# Patient Record
Sex: Male | Born: 1945 | Race: White | Hispanic: No | Marital: Single
Health system: Midwestern US, Community
[De-identification: ages and names within clinical notes are randomized; demographics above are authoritative.]

## PROBLEM LIST (undated history)

## (undated) DIAGNOSIS — K219 Gastro-esophageal reflux disease without esophagitis: Secondary | ICD-10-CM

## (undated) DIAGNOSIS — I10 Essential (primary) hypertension: Secondary | ICD-10-CM

## (undated) DIAGNOSIS — I6529 Occlusion and stenosis of unspecified carotid artery: Secondary | ICD-10-CM

## (undated) DIAGNOSIS — E785 Hyperlipidemia, unspecified: Secondary | ICD-10-CM

## (undated) DIAGNOSIS — G44229 Chronic tension-type headache, not intractable: Secondary | ICD-10-CM

## (undated) DIAGNOSIS — G20 Parkinson's disease: Secondary | ICD-10-CM

## (undated) HISTORY — DX: Occlusion and stenosis of unspecified carotid artery: I65.29

## (undated) HISTORY — DX: Gastro-esophageal reflux disease without esophagitis: K21.9

## (undated) HISTORY — DX: Hyperlipidemia, unspecified: E78.5

## (undated) HISTORY — PX: TONSILLECTOMY: SUR1361

## (undated) HISTORY — DX: Essential (primary) hypertension: I10

## (undated) HISTORY — DX: Parkinson's disease: G20

## (undated) HISTORY — DX: Chronic tension-type headache, not intractable: G44.229

---

## 2015-09-28 ENCOUNTER — Ambulatory Visit
Admit: 2015-09-28 | Discharge: 2015-09-28 | Payer: MEDICARE | Attending: Otolaryngology/Facial Plastic Surgery | Primary: Internal Medicine

## 2015-09-28 DIAGNOSIS — H6123 Impacted cerumen, bilateral: Secondary | ICD-10-CM

## 2015-09-28 NOTE — Progress Notes (Signed)
HPI:  Ricardo Campbell is a [age] y.o. male seen in follow-up for Follow-up (cerumen , last seen 03/30/15). Comes in today for ear cleaning- last seen 6 mo ago. Has noted some fullness in both ears (L > R) and also has had some increased allergy sxs. Stable from Parkinson's standpoint. No other new health issues. 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    Past Medical History, Past Surgical History, Family history, Social History, and Medications were all reviewed with the patient today and updated as necessary.     Allergies   Allergen Reactions   ??? Erythromycin Other (comments)     Stomach Cramps   ??? Pcn [Penicillins] Rash     Patient Active Problem List   Diagnosis Code   ??? Parkinson's disease (HCC) G20   ??? Arthritis M19.90   ??? GERD (gastroesophageal reflux disease) K21.9     Current Outpatient Prescriptions   Medication Sig   ??? carbidopa-levodopa (SINEMET) 25-100 mg per tablet Take 2 Tabs by mouth.   ??? mirtazapine (REMERON) 15 mg tablet Take 15 mg by mouth.   ??? rotigotine (NEUPRO) 6 mg/24 hour patch 1 Patch by TransDERmal route.   ??? pantoprazole (PROTONIX) 40 mg granules for oral suspension Take 1 Tab by mouth.   ??? losartan (COZAAR) 50 mg tablet Take 50 mg by mouth.   ??? Gluc-Chon-MSM#1-C-Mang-Bos-Bor 750-625-30 mg tab Take  by mouth.   ??? fluticasone (FLONASE) 50 mcg/actuation nasal spray 1 Spray by Nasal route.   ??? Dexlansoprazole 60 mg CpDB Take 60 mg by mouth.   ??? cetirizine (ZYRTEC) 10 mg tablet Take 10 mg by mouth.   ??? atorvastatin (LIPITOR) 20 mg tablet Take 20 mg by mouth.   ??? carbidopa-levodopa CR (SINEMET CR) 25-100 mg per tablet Take 1 Tab by mouth.   ??? acetaminophen (TYLENOL) 325 mg tablet Take 500 mg by mouth.   ??? omeprazole (PRILOSEC) 40 mg capsule Take 40 mg by mouth.   ??? aspirin delayed-release 81 mg tablet Take 81 mg by mouth.     No current facility-administered medications for this visit.      Past Medical History:    Diagnosis Date   ??? Arthritis 02/22/2015   ??? Cerumen impaction    ??? GERD (gastroesophageal reflux disease) 02/22/2015   ??? Parkinson's disease (HCC) 02/22/2015   ??? Rheumatic fever in pediatric patient     Age 70     Social History   Substance Use Topics   ??? Smoking status: Never Smoker   ??? Smokeless tobacco: Not on file   ??? Alcohol use Yes      Comment: Occasional     Past Surgical History:   Procedure Laterality Date   ??? HX CATARACT REMOVAL     ??? HX CERVICAL POLYPECTOMY     ??? HX FRACTURE TX Left     foot   ??? HX HEENT      Stapedectomy   ??? HX HEENT      Sinus surgery   ??? HX HEMORRHOIDECTOMY     ??? HX TONSILLECTOMY       Family History   Problem Relation Age of Onset   ??? Cancer Mother      Lymphoma   ??? Cancer Father      lung        ROS:    Review of Systems   Constitutional: Negative for fever.  Eyes: Negative for visual disturbance.   Respiratory: Negative for shortness of breath.    Cardiovascular: Negative for chest pain.   Gastrointestinal: Negative for abdominal pain.   Musculoskeletal: Negative for back pain.   Allergic/Immunologic: Negative for environmental allergies.   Neurological: Negative for dizziness.   Hematological: Negative for adenopathy.        PHYSICAL EXAM:    Visit Vitals   ??? Ht  (1.854 m)   ??? Wt 205 lb (93 kg)   ??? BMI 27.05 kg/m2     General: NAD, well-appearing  Neuro: No gross neuro deficits. No facial weakness.  Eyes: No periorbital edema/ecchymosis. No nystagmus.  Skin: No facial erythema, rashes or concerning lesions.  Nose: No external deviations or saddling. Intranasally, septum is midline without perforations, nasal mucosa appears healthy with no erythema, mucopurulence, or polyps.  Mouth: Moist mucus membranes, normal tongue/palate mobility, no concerning mucosal lesions. Oropharynx clear with no erythema/exudate, no tonsillar hypertrophy.  Ears: Normal appearing auricles, no hematomas. L EAC is almost completely occluded w/ cerumen, R side has mild cerumen laterally- both cleaned out  under the scope. After cleaning, there were intact TMs, no perfs, no MEEs  Neck: Soft, supple, no palpable neck masses. No thyromegaly or palpable thyroid nodules.   Lymphatics: No palpable cervical LAD.  Resp: No audible stridor or wheezing.  Extremities: No clubbing or cyanosis.    PROCEDURE: Cerumen removal under binocular microscopy  INDICATIONS: Cerumen impaction  DESCRIPTION: After verbal consent was obtained and a timeout was performed, the otologic microscope was used to visualize both ears. There were normal appearing auricles bilaterally. There was cerumen impaction on L side only, very mild wax on R side. I cleaned out both ears under the scope w/ a right angle hook and otologic suctions. After cleaning, the ear canal skin was healthy and both TMs were intact w/ no perforations. Both middle ear spaces were clear w/ no effusions. The patient tolerated the procedure well and there were no complications.    ASSESSMENT and PLAN      ICD-10-CM ICD-9-CM    1. Bilateral impacted cerumen H61.23 380.4 REMOVE IMPACTED EAR WAX     He had L > R cerumen impaction and I cleaned out his ears today under the scope. I recommend continued use of mineral oil to help w/ the wax. RTC in 6 mo for another ear cleaning.    Serita Butcher, MD  09/28/2015

## 2016-04-03 ENCOUNTER — Encounter: Attending: Otolaryngology/Facial Plastic Surgery | Primary: Internal Medicine

## 2016-04-10 ENCOUNTER — Encounter: Attending: Otolaryngology/Facial Plastic Surgery | Primary: Internal Medicine

## 2016-04-17 ENCOUNTER — Ambulatory Visit
Admit: 2016-04-17 | Discharge: 2016-04-17 | Payer: MEDICARE | Attending: Otolaryngology/Facial Plastic Surgery | Primary: Internal Medicine

## 2016-04-17 DIAGNOSIS — H6122 Impacted cerumen, left ear: Secondary | ICD-10-CM

## 2016-04-17 NOTE — Progress Notes (Signed)
HPI:  Ricardo Campbell is a 70 y.o. male seen in follow-up for Wax in Ear (last seen 09/28/15 for cerumen ). He has long h/o cerumen impaction and comes in for 6 mo ear cleaning. There has not been any otorrhea, otalgia, or ear fullness but he has noted some muffled hearing, esp on L side. [place] this [place].     Past Medical History, Past Surgical History, Family history, Social History, and Medications were all reviewed with the patient today and updated as necessary.     Allergies   Allergen Reactions   ??? Erythromycin Other (comments)     Stomach Cramps   ??? Pcn [Penicillins] Rash     Patient Active Problem List   Diagnosis Code   ??? Parkinson's disease (HCC) G20   ??? Arthritis M19.90   ??? GERD (gastroesophageal reflux disease) K21.9     Current Outpatient Prescriptions   Medication Sig   ??? pantoprazole (PROTONIX) 40 mg granules for oral suspension Take 1 Tab by mouth.   ??? omeprazole (PRILOSEC) 40 mg capsule Take 40 mg by mouth.   ??? losartan (COZAAR) 50 mg tablet Take 50 mg by mouth.   ??? Gluc-Chon-MSM#1-C-Mang-Bos-Bor 750-625-30 mg tab Take  by mouth.   ??? fluticasone (FLONASE) 50 mcg/actuation nasal spray 1 Spray by Nasal route.   ??? Dexlansoprazole 60 mg CpDB Take 60 mg by mouth.   ??? cetirizine (ZYRTEC) 10 mg tablet Take 10 mg by mouth.   ??? atorvastatin (LIPITOR) 20 mg tablet Take 20 mg by mouth.   ??? carbidopa-levodopa CR (SINEMET CR) 25-100 mg per tablet Take 1 Tab by mouth.   ??? aspirin delayed-release 81 mg tablet Take 81 mg by mouth.   ??? acetaminophen (TYLENOL) 325 mg tablet Take 500 mg by mouth.     No current facility-administered medications for this visit.      Past Medical History:   Diagnosis Date   ??? Arthritis 02/22/2015   ??? Cerumen impaction    ??? GERD (gastroesophageal reflux disease) 02/22/2015   ??? Parkinson's disease (HCC) 02/22/2015   ??? Rheumatic fever in pediatric patient     Age 29     Social History   Substance Use Topics   ??? Smoking status: Never Smoker    ??? Smokeless tobacco: Not on file   ??? Alcohol use Yes      Comment: Occasional     Past Surgical History:   Procedure Laterality Date   ??? HX CATARACT REMOVAL     ??? HX CERVICAL POLYPECTOMY     ??? HX FRACTURE TX Left     foot   ??? HX HEENT      Stapedectomy   ??? HX HEENT      Sinus surgery   ??? HX HEMORRHOIDECTOMY     ??? HX TONSILLECTOMY       Family History   Problem Relation Age of Onset   ??? Cancer Mother      Lymphoma   ??? Cancer Father      lung        ROS:    Review of Systems   Constitutional: Negative for fever.   Eyes: Negative for visual disturbance.   Respiratory: Negative for shortness of breath.    Cardiovascular: Negative for chest pain.   Gastrointestinal: Negative for abdominal pain.   Musculoskeletal: Negative for back pain.   Allergic/Immunologic: Negative for environmental allergies.   Neurological: Negative for dizziness.   Hematological:  Negative for adenopathy.        PHYSICAL EXAM:    Visit Vitals   ??? Ht 6\' 1"  (1.854 m)   ??? Wt 205 lb (93 kg)   ??? BMI 27.05 kg/m2       General: NAD, well-appearing  Neuro: No gross neuro deficits. No facial weakness.  Eyes: No periorbital edema/ecchymosis. No nystagmus.  Skin: No facial erythema, rashes or concerning lesions.  Nose: No external deviations or saddling. Intranasally, septum is midline without perforations, nasal mucosa appears healthy with no erythema, mucopurulence, or polyps.  Mouth: Moist mucus membranes, normal tongue/palate mobility, no concerning mucosal lesions. Oropharynx clear with no erythema/exudate, no tonsillar hypertrophy.  Ears: Normal appearing auricles, no hematomas. Mild cerumen on R side- L side completely impacted. Neck: Soft, supple, no palpable neck masses. No thyromegaly or palpable thyroid nodules.   Lymphatics: No palpable cervical LAD.  Resp: No audible stridor or wheezing.  Extremities: No clubbing or cyanosis.    PROCEDURE: Cerumen removal under binocular microscopy  INDICATIONS: Cerumen impaction   DESCRIPTION: After verbal consent was obtained and a timeout was performed, the otologic microscope was used to visualize both ears. There were normal appearing auricles bilaterally. There was cerumen impaction on L side only. I cleaned out both ears under the scope w/ a right angle hook and otologic suctions. After cleaning, the ear canal skin was healthy and both TMs were intact w/ no perforations. Both middle ear spaces were clear w/ no effusions. The patient tolerated the procedure well and there were no complications.    ASSESSMENT and PLAN      ICD-10-CM ICD-9-CM    1. Impacted cerumen of left ear H61.22 380.4 REMOVE IMPACTED EAR WAX     He has L > R cerumen impaction and I cleaned out his ears under the microscope. 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    Serita ButcherHans T Garland Smouse, MD  04/17/2016

## 2016-04-28 DIAGNOSIS — E7800 Pure hypercholesterolemia, unspecified: Secondary | ICD-10-CM | POA: Insufficient documentation

## 2016-10-16 ENCOUNTER — Ambulatory Visit
Admit: 2016-10-16 | Discharge: 2016-10-16 | Payer: MEDICARE | Attending: Otolaryngology/Facial Plastic Surgery | Primary: Internal Medicine

## 2016-10-16 DIAGNOSIS — H6123 Impacted cerumen, bilateral: Secondary | ICD-10-CM

## 2016-10-16 NOTE — Progress Notes (Signed)
HPI:  Ricardo Campbell. Fetterolf is a 71 y.o. male seen in follow-up for Wax in Ear (6 mth ear cleaning ,last seen 04/17/16 for cerumen ). He has long h/o cerumen impaction and comes in for routine 6 mo ear cleaning. Doing well since last appt in Sept and denies any otalgia or otorrhea. No major change in his hearing.     Past Medical History, Past Surgical History, Family history, Social History, and Medications were all reviewed with the patient today and updated as necessary.     Allergies   Allergen Reactions   ??? Erythromycin Other (comments)     Stomach Cramps   ??? Pcn [Penicillins] Rash     Patient Active Problem List   Diagnosis Code   ??? Parkinson's disease (HCC) G20   ??? Arthritis M19.90   ??? GERD (gastroesophageal reflux disease) K21.9     Current Outpatient Prescriptions   Medication Sig   ??? pantoprazole (PROTONIX) 40 mg granules for oral suspension Take 1 Tab by mouth.   ??? omeprazole (PRILOSEC) 40 mg capsule Take 40 mg by mouth.   ??? losartan (COZAAR) 50 mg tablet Take 50 mg by mouth.   ??? Gluc-Chon-MSM#1-C-Mang-Bos-Bor 750-625-30 mg tab Take  by mouth.   ??? fluticasone (FLONASE) 50 mcg/actuation nasal spray 1 Spray by Nasal route.   ??? Dexlansoprazole 60 mg CpDB Take 60 mg by mouth.   ??? cetirizine (ZYRTEC) 10 mg tablet Take 10 mg by mouth.   ??? atorvastatin (LIPITOR) 20 mg tablet Take 20 mg by mouth.   ??? aspirin delayed-release 81 mg tablet Take 81 mg by mouth.   ??? acetaminophen (TYLENOL) 325 mg tablet Take 500 mg by mouth.     No current facility-administered medications for this visit.      Past Medical History:   Diagnosis Date   ??? Arthritis 02/22/2015   ??? Cerumen impaction    ??? GERD (gastroesophageal reflux disease) 02/22/2015   ??? Parkinson's disease (HCC) 02/22/2015   ??? Rheumatic fever in pediatric patient     Age 15     Social History   Substance Use Topics   ??? Smoking status: Never Smoker   ??? Smokeless tobacco: Never Used   ??? Alcohol use Yes      Comment: Occasional     Past Surgical History:   Procedure Laterality Date    ??? HX CATARACT REMOVAL     ??? HX CERVICAL POLYPECTOMY     ??? HX FRACTURE TX Left     foot   ??? HX HEENT      Stapedectomy   ??? HX HEENT      Sinus surgery   ??? HX HEMORRHOIDECTOMY     ??? HX TONSILLECTOMY       Family History   Problem Relation Age of Onset   ??? Cancer Mother      Lymphoma   ??? Cancer Father      lung        ROS:    Review of Systems   Constitutional: Negative for fever.   Eyes: Negative for visual disturbance.   Respiratory: Negative for shortness of breath.    Cardiovascular: Negative for chest pain.   Gastrointestinal: Negative for abdominal pain.   Musculoskeletal: Negative for back pain.   Allergic/Immunologic: Negative for environmental allergies.   Neurological: Negative for dizziness.   Hematological: Negative for adenopathy.        PHYSICAL EXAM:    Visit Vitals   ??? Ht 6\' 1"  (1.854  m)   ??? Wt 205 lb (93 kg)   ??? BMI 27.05 kg/m2       General: NAD, well-appearing  Neuro: No gross neuro deficits. No facial weakness.  Eyes: No periorbital edema/ecchymosis. No nystagmus.  Skin: No facial erythema, rashes or concerning lesions.  Nose: No external deviations or saddling. Intranasally, septum is midline without perforations, nasal mucosa appears healthy with no erythema, mucopurulence, or polyps.  Mouth: Moist mucus membranes, normal tongue/palate mobility, no concerning mucosal lesions. Oropharynx clear with no erythema/exudate, no tonsillar hypertrophy.  Ears: Normal appearing auricles, no hematomas. EACs have bilateral cerumen impaction, healthy canal skin, TM's intact with no perforations or retraction pockets. No middle ear effusions.  Neck: Soft, supple, no palpable neck masses. No thyromegaly or palpable thyroid nodules.   Lymphatics: No palpable cervical LAD.  Resp: No audible stridor or wheezing.  Extremities: No clubbing or cyanosis.      PROCEDURE: Cerumen removal under binocular microscopy  INDICATIONS: Cerumen impaction  DESCRIPTION: After verbal consent was obtained and a timeout was  performed, the otologic microscope was used to visualize both ears. There were normal appearing auricles bilaterally. There was cerumen impaction bilaterally. I cleaned out both ears under the scope w/ a right angle hook and otologic suctions. After cleaning, the ear canal skin was healthy and both TMs were intact w/ no perforations. Both middle ear spaces were clear w/ no effusions. The patient tolerated the procedure well and there were no complications.        ASSESSMENT and PLAN      ICD-10-CM ICD-9-CM    1. Bilateral impacted cerumen H61.23 380.4 REMOVE IMPACTED EAR WAX     He had bilateral cerumen impaction and I cleaned out his ears today again under the scope- RTC in 6 mo for another ear cleaning.    Serita ButcherHans T Burnetta Kohls, MD  10/16/2016

## 2017-04-18 ENCOUNTER — Ambulatory Visit
Admit: 2017-04-18 | Discharge: 2017-04-18 | Payer: MEDICARE | Attending: Otolaryngology/Facial Plastic Surgery | Primary: Internal Medicine

## 2017-04-18 DIAGNOSIS — H6123 Impacted cerumen, bilateral: Secondary | ICD-10-CM

## 2017-04-18 NOTE — Progress Notes (Signed)
HPI:  Ricardo Campbell. Kreiter is a 71 y.o. male seen in for Follow-up (6 mth ear cleaning,last seen 10/16/16). He has long h/o cerumen impaction and comes in today for 6 mo ear cleaning- last seen in March. He has been doing well overall and denies any otalgia, ear fullness/pressure, or change in his hearing. His sister was recently dx w/ an aggressive form or Parkinson's and she has not been doing well at all, and that has caused him some increased stress as expected.    Past Medical History, Past Surgical History, Family history, Social History, and Medications were all reviewed with the patient today and updated as necessary.     Allergies   Allergen Reactions   ??? Erythromycin Other (comments)     Stomach Cramps   ??? Pcn [Penicillins] Rash     Patient Active Problem List   Diagnosis Code   ??? Parkinson's disease (HCC) G20   ??? Arthritis M19.90   ??? GERD (gastroesophageal reflux disease) K21.9     Current Outpatient Prescriptions   Medication Sig   ??? pantoprazole (PROTONIX) 40 mg granules for oral suspension Take 1 Tab by mouth.   ??? omeprazole (PRILOSEC) 40 mg capsule Take 40 mg by mouth.   ??? losartan (COZAAR) 50 mg tablet Take 50 mg by mouth.   ??? Gluc-Chon-MSM#1-C-Mang-Bos-Bor 750-625-30 mg tab Take  by mouth.   ??? fluticasone (FLONASE) 50 mcg/actuation nasal spray 1 Spray by Nasal route.   ??? Dexlansoprazole 60 mg CpDB Take 60 mg by mouth.   ??? cetirizine (ZYRTEC) 10 mg tablet Take 10 mg by mouth.   ??? atorvastatin (LIPITOR) 20 mg tablet Take 20 mg by mouth.   ??? aspirin delayed-release 81 mg tablet Take 81 mg by mouth.   ??? acetaminophen (TYLENOL) 325 mg tablet Take 500 mg by mouth.     No current facility-administered medications for this visit.      Past Medical History:   Diagnosis Date   ??? Arthritis 02/22/2015   ??? Cerumen impaction    ??? GERD (gastroesophageal reflux disease) 02/22/2015   ??? Parkinson's disease (HCC) 02/22/2015   ??? Rheumatic fever in pediatric patient     Age 11     Social History   Substance Use Topics    ??? Smoking status: Never Smoker   ??? Smokeless tobacco: Never Used   ??? Alcohol use Yes      Comment: Occasional     Past Surgical History:   Procedure Laterality Date   ??? HX CATARACT REMOVAL     ??? HX CERVICAL POLYPECTOMY     ??? HX FRACTURE TX Left     foot   ??? HX HEENT      Stapedectomy   ??? HX HEENT      Sinus surgery   ??? HX HEMORRHOIDECTOMY     ??? HX TONSILLECTOMY       Family History   Problem Relation Age of Onset   ??? Cancer Mother      Lymphoma   ??? Cancer Father      lung        ROS:    Review of Systems   Constitutional: Negative for fever.   HENT: Negative for ear discharge and ear pain.    Eyes: Negative for visual disturbance.   Respiratory: Negative for shortness of breath.    Cardiovascular: Negative for chest pain.   Gastrointestinal: Negative for abdominal pain.   Musculoskeletal: Negative for back pain.   Allergic/Immunologic: Negative for  environmental allergies.   Neurological: Negative for dizziness.   Hematological: Negative for adenopathy.        PHYSICAL EXAM:    Visit Vitals   ??? Ht  (1.854 m)   ??? Wt 203 lb (92.1 kg)   ??? BMI 26.78 kg/m2       General: NAD, well-appearing  Neuro: No gross neuro deficits. No facial weakness.  Eyes: No periorbital edema/ecchymosis. No nystagmus.  Skin: No facial erythema, rashes or concerning lesions.  Nose: No external deviations or saddling. Intranasally, septum is midline without perforations, nasal mucosa appears healthy with no erythema, mucopurulence, or polyps.  Mouth: Moist mucus membranes, normal tongue/palate mobility, no concerning mucosal lesions. Oropharynx clear with no erythema/exudate, no tonsillar hypertrophy.  Ears: Normal appearing auricles, no hematomas. EACs have cerumen impaction bilaterally (L > R), healthy canal skin, TM's intact with no perforations or retraction pockets. No middle ear effusions.  Neck: Soft, supple, no palpable neck masses. No thyromegaly or palpable thyroid nodules.   Lymphatics: No palpable cervical LAD.   Resp: No audible stridor or wheezing.  Extremities: No clubbing or cyanosis.      PROCEDURE: Cerumen removal under binocular microscopy  INDICATIONS: Cerumen impaction  DESCRIPTION: After verbal consent was obtained and a timeout was performed, the otologic microscope was used to visualize both ears. There were normal appearing auricles bilaterally. There was cerumen impaction bilaterally (L > R). I cleaned out both ears under the scope w/ a right angle hook and otologic suctions. After cleaning, the ear canal skin was healthy and both TMs were intact w/ no perforations. Both middle ear spaces were clear w/ no effusions. The patient tolerated the procedure well and there were no complications.    ASSESSMENT and PLAN      ICD-10-CM ICD-9-CM    1. Bilateral impacted cerumen H61.23 380.4 REMOVE IMPACTED EAR WAX     He had cerumen impaction bilaterally and I cleaned out his ears today under the scope- RTC in 6 mo for routine ear cleaning.    Serita Butcher, MD  04/18/2017

## 2017-07-31 DIAGNOSIS — J324 Chronic pansinusitis: Secondary | ICD-10-CM | POA: Insufficient documentation

## 2017-10-15 ENCOUNTER — Ambulatory Visit
Admit: 2017-10-15 | Discharge: 2017-10-15 | Payer: MEDICARE | Attending: Otolaryngology/Facial Plastic Surgery | Primary: Internal Medicine

## 2017-10-15 DIAGNOSIS — H6123 Impacted cerumen, bilateral: Secondary | ICD-10-CM

## 2017-10-15 NOTE — Progress Notes (Signed)
HPI:  Ricardo Campbell. [name] is a 72 y.o. male seen in follow-up for Wax in Ear (routine ear cleaning,last seen 04/18/17). Comes in for routine 6 mo ear cleaning- last seen in late Sept. Doing well since then- denies any major change in his hearing or ear fullness/pain. His PD has been stable but his sister has very advanced Parkinson's and currently is in hospice care. No other new medical issues.    Past Medical History, Past Surgical History, Family history, Social History, and Medications were all reviewed with the patient today and updated as necessary.     Allergies   Allergen Reactions   ??? Erythromycin Other (comments)     Stomach Cramps   ??? Pcn [Penicillins] Rash     Patient Active Problem List   Diagnosis Code   ??? Parkinson's disease (HCC) G20   ??? Arthritis M19.90   ??? GERD (gastroesophageal reflux disease) K21.9     Current Outpatient Medications   Medication Sig   ??? pantoprazole (PROTONIX) 40 mg granules for oral suspension Take 1 Tab by mouth.   ??? omeprazole (PRILOSEC) 40 mg capsule Take 40 mg by mouth.   ??? losartan (COZAAR) 50 mg tablet Take 50 mg by mouth.   ??? Gluc-Chon-MSM#1-C-Mang-Bos-Bor 750-625-30 mg tab Take  by mouth.   ??? fluticasone (FLONASE) 50 mcg/actuation nasal spray 1 Spray by Nasal route.   ??? Dexlansoprazole 60 mg CpDB Take 60 mg by mouth.   ??? cetirizine (ZYRTEC) 10 mg tablet Take 10 mg by mouth.   ??? atorvastatin (LIPITOR) 20 mg tablet Take 20 mg by mouth.   ??? aspirin delayed-release 81 mg tablet Take 81 mg by mouth.   ??? acetaminophen (TYLENOL) 325 mg tablet Take 500 mg by mouth.     No current facility-administered medications for this visit.      Past Medical History:   Diagnosis Date   ??? Arthritis 02/22/2015   ??? Cerumen impaction    ??? GERD (gastroesophageal reflux disease) 02/22/2015   ??? Parkinson's disease (HCC) 02/22/2015   ??? 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 Smoking status: Never Smoker   ??? Smokeless tobacco: Never Used   Substance Use Topics    ??? Alcohol use: Yes     Comment: Occasional     Past Surgical History:   Procedure Laterality Date   ??? HX CATARACT REMOVAL     ??? HX CERVICAL POLYPECTOMY     ??? HX FRACTURE TX Left     foot   ??? HX HEENT      Stapedectomy   ??? HX HEENT      Sinus surgery   ??? HX HEMORRHOIDECTOMY     ??? HX TONSILLECTOMY       Family History   Problem Relation Age of Onset   ??? Cancer Mother         Lymphoma   ??? Cancer Father         lung        ROS:    Review of Systems   Constitutional: Negative for fever.   HENT: Negative for ear discharge and ear pain.    Eyes: Negative for visual disturbance.   Respiratory: Negative for shortness of breath.    Cardiovascular: Negative for chest pain.   Gastrointestinal: Negative for abdominal pain.   Musculoskeletal: Negative for back pain.   Allergic/Immunologic: Negative for environmental allergies.  Neurological: Negative for dizziness.   Hematological: Negative for adenopathy.        PHYSICAL EXAM:    Visit Vitals  Ht 6\' 1"  (1.854 m)   Wt 200 lb 6.4 oz (90.9 kg)   BMI 26.44 kg/m??       General: NAD, well-appearing  Neuro: No gross neuro deficits. No facial weakness.  Eyes: No periorbital edema/ecchymosis. No nystagmus.  Skin: No facial erythema, rashes or concerning lesions.  Nose: No external deviations or saddling. Intranasally, septum is midline without perforations, nasal mucosa appears healthy with no erythema, mucopurulence, or polyps.  Mouth: Moist mucus membranes, normal tongue/palate mobility, no concerning mucosal lesions. Oropharynx clear with no erythema/exudate, no tonsillar hypertrophy.  Ears: Normal appearing auricles, no hematomas. EACs have cerumen impaction bilaterally, healthy canal skin, TM's intact with no perforations or retraction pockets. No middle ear effusions.  Neck: Soft, supple, no palpable neck masses. No thyromegaly or palpable thyroid nodules.   Lymphatics: No palpable cervical LAD.  Resp: No audible stridor or wheezing.  Extremities: No clubbing or cyanosis.       PROCEDURE: Cerumen removal under binocular microscopy  INDICATIONS: Cerumen impaction  DESCRIPTION: After verbal consent was obtained and a timeout was performed, the otologic microscope was used to visualize both ears. There were normal appearing auricles bilaterally. There was cerumen impaction bilaterally. I cleaned out both ears under the scope w/ a right angle hook and otologic suctions. After cleaning, the ear canal skin was healthy and both TMs were intact w/ no perforations. Both middle ear spaces were clear w/ no effusions. The patient tolerated the procedure well and there were no complications.      ASSESSMENT and PLAN      ICD-10-CM ICD-9-CM    1. Bilateral impacted cerumen H61.23 380.4      He had cerumen impaction bilaterally and I cleaned out his ears today under the microscope. After cleaning, his ears were healthy on exam with no other pathology. RTC in 6 mo for routine ear cleaning.    Serita ButcherHans T Laquesha Holcomb, MD  10/15/2017

## 2018-02-06 DIAGNOSIS — M1711 Unilateral primary osteoarthritis, right knee: Secondary | ICD-10-CM | POA: Insufficient documentation

## 2018-02-12 DIAGNOSIS — R011 Cardiac murmur, unspecified: Secondary | ICD-10-CM | POA: Insufficient documentation

## 2018-04-17 ENCOUNTER — Encounter: Payer: MEDICARE | Attending: Otolaryngology/Facial Plastic Surgery | Primary: Internal Medicine

## 2018-04-29 ENCOUNTER — Encounter: Payer: Self-pay | Admitting: Internal Medicine

## 2018-04-29 ENCOUNTER — Ambulatory Visit: Payer: Medicare Other | Admitting: Internal Medicine

## 2018-04-29 ENCOUNTER — Other Ambulatory Visit: Payer: Self-pay

## 2018-04-29 VITALS — BP 149/59 | HR 74 | Temp 98.5°F | Ht 73.0 in | Wt 205.5 lb

## 2018-04-29 DIAGNOSIS — Z881 Allergy status to other antibiotic agents status: Secondary | ICD-10-CM

## 2018-04-29 DIAGNOSIS — I6529 Occlusion and stenosis of unspecified carotid artery: Secondary | ICD-10-CM

## 2018-04-29 DIAGNOSIS — I10 Essential (primary) hypertension: Secondary | ICD-10-CM | POA: Diagnosis not present

## 2018-04-29 DIAGNOSIS — E785 Hyperlipidemia, unspecified: Secondary | ICD-10-CM | POA: Insufficient documentation

## 2018-04-29 DIAGNOSIS — G44229 Chronic tension-type headache, not intractable: Secondary | ICD-10-CM

## 2018-04-29 DIAGNOSIS — Z23 Encounter for immunization: Secondary | ICD-10-CM | POA: Diagnosis not present

## 2018-04-29 DIAGNOSIS — Z880 Allergy status to penicillin: Secondary | ICD-10-CM

## 2018-04-29 DIAGNOSIS — Z91013 Allergy to seafood: Secondary | ICD-10-CM

## 2018-04-29 DIAGNOSIS — Z8781 Personal history of (healed) traumatic fracture: Secondary | ICD-10-CM

## 2018-04-29 DIAGNOSIS — I6523 Occlusion and stenosis of bilateral carotid arteries: Secondary | ICD-10-CM | POA: Diagnosis not present

## 2018-04-29 DIAGNOSIS — G20 Parkinson's disease: Secondary | ICD-10-CM

## 2018-04-29 DIAGNOSIS — K219 Gastro-esophageal reflux disease without esophagitis: Secondary | ICD-10-CM

## 2018-04-29 DIAGNOSIS — G20A1 Parkinson's disease without dyskinesia, without mention of fluctuations: Secondary | ICD-10-CM | POA: Insufficient documentation

## 2018-04-29 DIAGNOSIS — Z79899 Other long term (current) drug therapy: Secondary | ICD-10-CM

## 2018-04-29 DIAGNOSIS — Z1211 Encounter for screening for malignant neoplasm of colon: Secondary | ICD-10-CM | POA: Insufficient documentation

## 2018-04-29 DIAGNOSIS — Z9049 Acquired absence of other specified parts of digestive tract: Secondary | ICD-10-CM

## 2018-04-29 DIAGNOSIS — Z7982 Long term (current) use of aspirin: Secondary | ICD-10-CM

## 2018-04-29 HISTORY — DX: Hyperlipidemia, unspecified: E78.5

## 2018-04-29 HISTORY — DX: Chronic tension-type headache, not intractable: G44.229

## 2018-04-29 HISTORY — DX: Essential (primary) hypertension: I10

## 2018-04-29 HISTORY — DX: Occlusion and stenosis of unspecified carotid artery: I65.29

## 2018-04-29 HISTORY — DX: Gastro-esophageal reflux disease without esophagitis: K21.9

## 2018-04-29 HISTORY — DX: Parkinson's disease without dyskinesia, without mention of fluctuations: G20.A1

## 2018-04-29 HISTORY — DX: Parkinson's disease: G20

## 2018-04-29 NOTE — Assessment & Plan Note (Signed)
Carotid artery stenosis: Noted on Carotid US, interpreted as Mild Stenosis bilaterally that was obtained for a brief syncopal event, by his former PCP. Currently asymptomatic

## 2018-04-29 NOTE — Assessment & Plan Note (Signed)
HLD: Will continue Atorvastatin. Will need Lipid panel at next visit

## 2018-04-29 NOTE — Progress Notes (Addendum)
CC: Establish care for his HTN   HPI:[name] is a 72 y.o. male who presents today to establish care for his HTN, HLD, GERD, and basic health needs. Please see individual problem based A/P for details.  HTN: Difficult to control at baseline. Patient stated that his BP is typically higher than the 149/59 recorded today.   Plan: BMP today for baseline renal function Will increase Losartan to 100mg  daily if amenable  Consider Amlodipine   Headaches: He denied a severe headache but states that he has headaches at baseline on occasion that are mild and self limited but have occurred with increased frequency. He notes 1-2 per week max. They are bilateral, improve with rest and NSAIDS, not associated with photophobia or phonophobia. Not associated with nausea or vomiting. No known trigger. Monitor the frequency and severity of his headaches and consider treatment based on symptom progression. These appear to be tension headaches currently based on the description.   Carotid artery stenosis: Noted on Carotid US, interpreted as Mild Stenosis bilaterally that was obtained for a brief syncopal event, by his former PCP. Currently asymptomatic   Parkinson's Disease: Will follow with A research neurologist in [place].  Continue carbidopa-levodopa (SINEMET CR) 50-200 mg per   Syncopal Event: Was seen by his primary physician and neurologist in [place] who felt his symptoms were related to parkinsons vs hypotension.   Health Maintenance: Last colonoscopy in 2014, had been Q5 years prior. Please inquire as to why and consider repeating.   HLD: Will continue Atorvastatin. Will need Lipid panel at next visit  GERD: Will continue PPI as this appears to control his symptoms well. Discuss trial off of PPI in next few visits.   Outpatient Medications: (I have personally confirmed the below medications with the patient today) . acetaminophen (TYLENOL) 325 mg tablet, Take 500 mg by mouth  2 (two) times a day , Disp: , Rfl:  . aspirin (ECOTRIN) 81 mg EC tablet, Take 81 mg by mouth daily., Disp: , Rfl:  . atorvastatin (LIPITOR) 20 MG tablet, Take 1 tablet (20 mg) by mouth daily., Disp: 90 tablet, Rfl: 3 . Bifidobacterium infantis (ALIGN) 10.5 mg (10 million cell) chew, Take 1 capsule by mouth daily., Disp: 30 tablet, Rfl: 0 . carbidopa-levodopa (SINEMET CR) 50-200 mg per tablet, Take 1 tablet by mouth nightly, Disp: 90 tablet, Rfl: 3 . cetirizine (ZyrTEC) 10 mg tablet, Take 10 mg by mouth as needed for allergies., Disp: , Rfl:  . fexofenadine (ALLEGRA ALLERGY) 180 mg tablet, Take 180 mg by mouth daily., Disp: , Rfl:  . fluticasone (FLONASE) 50 mcg/actuation nasal spray, 1 spray by Each Nare route as needed for rhinitis., Disp: , Rfl:  . GLUCOSAM HCL/CHONDRO SU A/C/MN (GLUCOSAMINE-CHONDROITIN COMPLX ORAL), Take by mouth daily., Disp: , Rfl:  . losartan (COZAAR) 50 mg tablet, Take 1 tablet (50 mg) by mouth daily., Disp: 90 tablet, Rfl: 3 . mirtazapine (REMERON) 15 mg tablet, Take 1 tablet (15 mg) by mouth nightly., Disp: 30 tablet, Rfl: 5 . NAPROXEN SODIUM (ALEVE ORAL), Take by mouth daily. Alternating with tylenol once a day, Disp: , Rfl:  . omeprazole (PriLOSEC) 40 MG capsule, Take 1 capsule (40 mg) by mouth daily., Disp: 90 capsule, Rfl: 3 . rotigotine (NEUPRO) 6 mg/24 hour, Place 1 patch on the skin daily., Disp: 90 patch, Rfl: 3 . therapeutic multivitamin (THERAGRAN) tablet, Take 1 tablet by mouth daily., Disp: , Rfl:  . traZODone (DESYREL) 50 mg tablet, TAKE 1 TABLET BY MOUTH NIGHTLY, Disp:  90 tablet, Rfl: 1 . TROLAMINE SALICYLATE (ASPERCREME TOP), Apply topically as needed., Disp: , Rfl:    Immunization History  Administered Date(s) Administered  . INFLUENZA TRIVALENT HIGH DOSE PRESERVATIVE FREE IM 04/30/2017  . Influenza, Unspecified 04/14/2016, 04/30/2017  . Pneumococcal Conjugate 13-Valent 08/05/2013  . Pneumococcal Polysaccharide 10/28/2015  . Tdap 08/05/2013  .  Zoster(ZOSTAVAX) 05/02/2010   Review of Systems:   Review of Systems  Constitutional: Negative for chills, diaphoresis and fever.  HENT: Negative for ear pain and sinus pain.   Eyes: Negative for blurred vision and photophobia.  Respiratory: Negative for cough and shortness of breath.   Cardiovascular: Negative for chest pain and leg swelling.  Gastrointestinal: Negative for constipation, diarrhea, nausea and vomiting.  Genitourinary: Negative for flank pain and urgency.  Musculoskeletal: Negative for myalgias.  Neurological: Positive for headaches (Occurring with increased frequency ). Negative for dizziness and weakness.  Psychiatric/Behavioral: Negative for depression. The patient is not nervous/anxious.    Family History: Sister, DMII Sister, DMII, now deceased 2/2 parkinsons (corticobal degeneration) Mother, Lymphoma (deceased) Father, primary pulmonary cancer Brother, COPD  Social History: Lives home alone, two dogs Retired, Doctor, [employer], Scientist, forensic  Patient denied EtOH, Tobacco or illicit drugs  PMHx: HTN Carotid Artery Stenosis  Parkinson's disease Arthritis GERD HLD  Surgical Hx: Tonsillectomy Hemorrhoidectomy  Ankle fracture and repair  Sinus surgery (Right ear, had pen placed)           COLONOSCOPY 2002, 2004, 2009, 2014    Allergies: Penicillin, Rash Erythromycin (only), Abdominal pain Clams (shellfish), vomiting and diarrhea  Physical Exam: Vitals:   04/29/18 1332  BP: (!) 149/59  Pulse: 74  Temp: 98.5 F (36.9 C)  TempSrc: Oral  SpO2: 98%  Weight: 205 lb 8 oz (93.2 kg)  Height: 6\' 1"  (1.854 m)   Physical Exam  Constitutional: He appears well-developed and well-nourished. No distress.  HENT:  Head: Normocephalic and atraumatic.  Eyes: Conjunctivae and EOM are normal.  Neck: Normal range of motion.  Cardiovascular: Normal rate and regular rhythm.  No murmur heard. Pulmonary/Chest: Effort normal and breath sounds  normal. No stridor. No respiratory distress.  Abdominal: Soft. Bowel sounds are normal. He exhibits no distension.  Musculoskeletal: He exhibits no edema or tenderness.  Neurological: He is alert.  Skin: Skin is warm. He is not diaphoretic.  Psychiatric: He has a normal mood and affect.   Assessment & Plan:   See Encounters Tab for problem based charting.  Patient discussed with Dr. Heide Spark

## 2018-04-29 NOTE — Patient Instructions (Signed)
FOLLOW-UP INSTRUCTIONS When: 3-4 months For: Routine What to bring: All of your medications  I have not made any changes to your medications. We will obtain the basic metabolic profile lab today and notify you of the need to change or increase the medications used to treat high blood pressure if needed.  Thank you for your visit to the [facility] today. We have an on call resident available to assist by phone 24/7.

## 2018-04-29 NOTE — Assessment & Plan Note (Signed)
Received Influenza vaccination today

## 2018-04-29 NOTE — Assessment & Plan Note (Addendum)
  HTN: Difficult to control at baseline. Patient stated that his BP is typically higher than the 149/59 recorded today.   Plan: BMP today for baseline renal function Will increase Losartan to 100mg  daily if amenable  Consider Amlodipine   Addendum: Please assess need for increasing Losartan to 100mg . BMP was normal, patient notified by phone.

## 2018-04-29 NOTE — Assessment & Plan Note (Signed)
Parkinson's Disease: Will follow with A research neurologist in [place].  Continue carbidopa-levodopa (SINEMET CR) 50-200 mg per

## 2018-04-29 NOTE — Assessment & Plan Note (Signed)
Headaches: He denied a severe headache but states that he has headaches at baseline on occasion that are mild and self limited but have occurred with increased frequency. He notes 1-2 per week max. They are bilateral, improve with rest and NSAIDS, not associated with photophobia or phonophobia. Not associated with nausea or vomiting. No known trigger. Monitor the frequency and severity of his headaches and consider treatment based on symptom progression. These appear to be tension headaches currently based on the description.

## 2018-04-29 NOTE — Assessment & Plan Note (Signed)
Last colonoscopy in 2014, had been Q5 years prior. Please inquire as to why and consider repeating.

## 2018-04-30 LAB — BMP8+ANION GAP
ANION GAP: 14 mmol/L (ref 10.0–18.0)
BUN/Creatinine Ratio: 17 (ref 10–24)
BUN: 14 mg/dL (ref 8–27)
CO2: 26 mmol/L (ref 20–29)
CREATININE: 0.84 mg/dL (ref 0.76–1.27)
Calcium: 9 mg/dL (ref 8.6–10.2)
Chloride: 98 mmol/L (ref 96–106)
GFR calc Af Amer: 102 mL/min/{1.73_m2} (ref >59)
GFR, EST NON AFRICAN AMERICAN: 88 mL/min/{1.73_m2} (ref >59)
Glucose: 94 mg/dL (ref 65–99)
POTASSIUM: 4.5 mmol/L (ref 3.5–5.2)
Sodium: 138 mmol/L (ref 134–144)

## 2018-05-01 NOTE — Progress Notes (Signed)
Internal Medicine Clinic Attending  Case discussed with Dr. Harbrecht at the time of the visit.  We reviewed the resident's history and exam and pertinent patient test results.  I agree with the assessment, diagnosis, and plan of care documented in the resident's note.   

## 2018-07-22 ENCOUNTER — Other Ambulatory Visit: Payer: Self-pay

## 2018-07-22 NOTE — Telephone Encounter (Signed)
atorvastatin (LIPITOR) 20 MG tablet, REFILL REQUEST @ [facility] [address].

## 2018-07-26 MED ORDER — ATORVASTATIN CALCIUM 20 MG PO TABS: 20.0000 mg | ORAL_TABLET | Freq: Every day | ORAL | 3 refills | Status: DC

## 2018-07-29 ENCOUNTER — Ambulatory Visit: Payer: Medicare PPO | Admitting: Internal Medicine

## 2018-07-29 ENCOUNTER — Other Ambulatory Visit: Payer: Self-pay

## 2018-07-29 ENCOUNTER — Encounter: Payer: Self-pay | Admitting: Internal Medicine

## 2018-07-29 VITALS — BP 116/88 | HR 78 | Temp 97.4°F | Ht 73.0 in | Wt 206.6 lb

## 2018-07-29 DIAGNOSIS — Z79899 Other long term (current) drug therapy: Secondary | ICD-10-CM | POA: Diagnosis not present

## 2018-07-29 DIAGNOSIS — G20 Parkinson's disease: Secondary | ICD-10-CM | POA: Diagnosis not present

## 2018-07-29 DIAGNOSIS — I10 Essential (primary) hypertension: Secondary | ICD-10-CM | POA: Diagnosis not present

## 2018-07-29 DIAGNOSIS — G4700 Insomnia, unspecified: Secondary | ICD-10-CM | POA: Diagnosis not present

## 2018-07-29 DIAGNOSIS — Z1211 Encounter for screening for malignant neoplasm of colon: Secondary | ICD-10-CM

## 2018-07-29 MED ORDER — CARBIDOPA-LEVODOPA ER 50-200 MG PO TBCR: 1.0000 | EXTENDED_RELEASE_TABLET | Freq: Every day | ORAL | 1 refills | Status: DC

## 2018-07-29 MED ORDER — LOSARTAN POTASSIUM 50 MG PO TABS: 50.0000 mg | ORAL_TABLET | Freq: Every day | ORAL | 3 refills | Status: DC

## 2018-07-29 MED ORDER — MIRTAZAPINE 15 MG PO TABS: 15.0000 mg | ORAL_TABLET | Freq: Every day | ORAL | 1 refills | Status: DC

## 2018-07-29 MED ORDER — ATORVASTATIN CALCIUM 20 MG PO TABS: 20.0000 mg | ORAL_TABLET | Freq: Every day | ORAL | 3 refills | Status: DC

## 2018-07-29 MED ORDER — SILDENAFIL CITRATE 25 MG PO TABS: 50.0000 mg | ORAL_TABLET | Freq: Every day | ORAL | 0 refills | Status: DC | PRN

## 2018-07-29 MED ORDER — OMEPRAZOLE 40 MG PO CPDR: 40.0000 mg | DELAYED_RELEASE_CAPSULE | Freq: Every day | ORAL | 1 refills | Status: DC

## 2018-07-29 MED ORDER — TRAZODONE HCL 50 MG PO TABS: 50.0000 mg | ORAL_TABLET | Freq: Every day | ORAL | 0 refills | Status: DC

## 2018-07-29 NOTE — Assessment & Plan Note (Signed)
Need for colon cancer screening: Will refer to GI for Colonoscopy due to multiple polyps on prior colonoscopies.

## 2018-07-29 NOTE — Assessment & Plan Note (Signed)
HTN: Patients BP 116/88 today. He endorses compliance with his medications . Based on the blood pressure and medication compliance without notable side effects including the absence of dizziness, bilateral lower extremity edema, lightheadedness, headaches, or other concerning symptoms, we will continue his current regimen.  Plan: Continue Losartan 50mg  daily today Continue Amlodipine 10mg  daily

## 2018-07-29 NOTE — Patient Instructions (Addendum)
FOLLOW-UP INSTRUCTIONS When: 6 months For: Routine visit with me What to bring: All of your medications  I have added a prescription for sildenafil today.   Today we discussed your high blood pressure, need to establish with a new neurologist and need for colon cancer screening. I have placed a referral to GI for a colonoscopy. I have continued your medication for your high blood pressure and I have added the sildenafil as requested. With regard to your need for your sleep hygiene, please consider calling your former neurologist for questions.   Thank you for your visit to the [facility] today. If you have any questions or concerns please call us at [phone].   Sleep hygiene   Sleep only as much as you need to feel rested and then get out of bed  Keep a regular sleep schedule  Do not try to sleep unless you feel sleepy  Exercise regularly, preferably at least4 to 5 hours before bedtime  Avoid caffeinated beverages after lunch  Avoid alcohol near bedtime: no "night cap"  Avoid smoking, especially in the evening  Do not go to bed hungry  Make the bedroom environment conducive to sleep  Avoid prolonged use of light-emitting screens before bedtime  Deal with your worries before bedtime

## 2018-07-29 NOTE — Assessment & Plan Note (Signed)
Parkinson's Disease: He is scheduled to see a [facility] research neurologist in June per patient report. 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 He feels that the Remeron QHS and Trazodone QHS PRN combination is insufficient as his condition progresses.  He utilizes trazodone 2-3 times weekly without as sufficient benefit as previously experienced.  Given the complexity of his illness, I have advised good sleep hygiene, which he endorses he adheres to, but will defer medication changes to his neurologist. I have advised that he contact his former neurologist to see if they are able to assist him while he awaits his appointment.  Plan:  Continue Remeron 15mg  nightly Continue Trazodone 50mg  QHS PRN, advised that he increase this to 3-4 times weekly as needed but did not increase the dose. Continue Sinemet daily 20-200 ER daily

## 2018-07-29 NOTE — Progress Notes (Signed)
   CC: Routine visit for HTN  HPI:[name] is a 74 y.o. male who presents for evaluation of his HTN, other chronic medical conditions. Please see individual problem based A/P for details.  HTN: Patients BP 116/88 today. He endorses compliance with his medications . Based on the blood pressure and medication compliance without notable side effects including the absence of dizziness, bilateral lower extremity edema, lightheadedness, headaches, or other concerning symptoms, we will continue his current regimen.  Plan: Continue Losartan 50mg  daily today Continue Amlodipine 10mg  daily   Parkinson's Disease: He is scheduled to see a [facility] research neurologist in June per patient report. 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 He feels that the Remeron QHS and Trazodone QHS PRN combination is insufficient as his condition progresses.  He utilizes trazodone 2-3 times weekly without as sufficient benefit as previously experienced.  Given the complexity of his illness, I have advised good sleep hygiene, which he endorses he adheres to, but will defer medication changes to his neurologist. I have advised that he contact his former neurologist to see if they are able to assist him while he awaits his appointment.  Plan:  Continue Remeron 15mg  nightly Continue Trazodone 50mg  QHS PRN, advised that he increase this to 3-4 times weekly as needed but did not increase the dose. Continue Sinemet daily 20-200 ER daily  Need for colon cancer screening: Will refer to GI for Colonoscopy due to multiple polyps on prior colonoscopies.   Pneumonia PPX: Will need to reinforce the importance of the pneumonia vaccine at his next appointment.  PHQ-9: Based on the patients    Office Visit from 07/29/2018 in [facility]  PHQ-9 Total Score  5     score we have decided to monitor.  Past Medical History:  Diagnosis Date  . Carotid artery  stenosis 04/29/2018  . Chronic tension headaches 04/29/2018  . Essential hypertension 04/29/2018  . GERD (gastroesophageal reflux disease) 04/29/2018  . HLD (hyperlipidemia) 04/29/2018  . Parkinson's disease (HCC) 04/29/2018   Review of Systems: ROS negative except as per HPI  Physical Exam: Vitals:   07/29/18 1326  BP: 116/88  Pulse: 78  Temp: (!) 97.4 F (36.3 C)  TempSrc: Oral  SpO2: 95%  Weight: 206 lb 9.6 oz (93.7 kg)  Height: 6\' 1"  (1.854 m)   General: A/O x4, no acute distress, afebrile, not diaphoretic Cardio: RRR, no murmurs rubs or gallops auscultated Pulmonary: Clear to auscultation bilaterally no wheezing rhonchi or rales MSK: BLE are nontender, and nonedematous to palpation  Assessment & Plan:   See Encounters Tab for problem based charting.  Patient discussed with Dr. Josem Kaufmann

## 2018-07-31 ENCOUNTER — Encounter: Payer: Medicare Other | Admitting: Internal Medicine

## 2018-08-05 NOTE — Progress Notes (Signed)
Case discussed with Dr. Harbrecht at the time of the visit.  We reviewed the resident's history and exam and pertinent patient test results.  I agree with the assessment, diagnosis and plan of care documented in the resident's note. 

## 2018-08-28 ENCOUNTER — Telehealth: Payer: Self-pay | Admitting: *Deleted

## 2018-08-28 NOTE — Telephone Encounter (Signed)
Received faxed request from pharmacy to resend losartan as 25 mg, take 2 tabs as 50 mg tab is on backorder. Thanks!

## 2018-08-29 NOTE — Telephone Encounter (Signed)
Please change losartan to 25mg  take 2 tabs daily due to backorder of 50mg .

## 2018-08-30 MED ORDER — LOSARTAN POTASSIUM 25 MG PO TABS: 50.0000 mg | ORAL_TABLET | Freq: Every day | ORAL | 3 refills | Status: DC

## 2018-08-30 NOTE — Addendum Note (Signed)
Addended by: Evelena Leyden on: 08/30/2018 07:35 AM   Modules accepted: Orders

## 2018-09-02 ENCOUNTER — Other Ambulatory Visit: Payer: Self-pay | Admitting: Internal Medicine

## 2018-09-12 NOTE — Telephone Encounter (Signed)
Called pharmacy to confirm that issue was resolved.  No further action needed. Phone call complete, will close encounter.Kingsley Spittle Cassady2/20/202010:39 AM

## 2018-09-23 DIAGNOSIS — H26492 Other secondary cataract, left eye: Secondary | ICD-10-CM | POA: Diagnosis not present

## 2018-09-26 DIAGNOSIS — M1711 Unilateral primary osteoarthritis, right knee: Secondary | ICD-10-CM | POA: Diagnosis not present

## 2018-09-30 DIAGNOSIS — H26492 Other secondary cataract, left eye: Secondary | ICD-10-CM | POA: Diagnosis not present

## 2018-10-03 DIAGNOSIS — M545 Low back pain: Secondary | ICD-10-CM | POA: Diagnosis not present

## 2018-10-03 DIAGNOSIS — M6281 Muscle weakness (generalized): Secondary | ICD-10-CM | POA: Diagnosis not present

## 2018-10-03 DIAGNOSIS — M5136 Other intervertebral disc degeneration, lumbar region: Secondary | ICD-10-CM | POA: Diagnosis not present

## 2018-10-09 DIAGNOSIS — M6281 Muscle weakness (generalized): Secondary | ICD-10-CM | POA: Diagnosis not present

## 2018-10-09 DIAGNOSIS — M5136 Other intervertebral disc degeneration, lumbar region: Secondary | ICD-10-CM | POA: Diagnosis not present

## 2018-10-11 DIAGNOSIS — M6281 Muscle weakness (generalized): Secondary | ICD-10-CM | POA: Diagnosis not present

## 2018-10-11 DIAGNOSIS — M5136 Other intervertebral disc degeneration, lumbar region: Secondary | ICD-10-CM | POA: Diagnosis not present

## 2018-10-17 DIAGNOSIS — M5136 Other intervertebral disc degeneration, lumbar region: Secondary | ICD-10-CM | POA: Diagnosis not present

## 2018-10-17 DIAGNOSIS — M6281 Muscle weakness (generalized): Secondary | ICD-10-CM | POA: Diagnosis not present

## 2018-10-22 DIAGNOSIS — M5136 Other intervertebral disc degeneration, lumbar region: Secondary | ICD-10-CM | POA: Diagnosis not present

## 2018-10-22 DIAGNOSIS — M6281 Muscle weakness (generalized): Secondary | ICD-10-CM | POA: Diagnosis not present

## 2018-10-24 DIAGNOSIS — M5136 Other intervertebral disc degeneration, lumbar region: Secondary | ICD-10-CM | POA: Diagnosis not present

## 2018-10-24 DIAGNOSIS — M6281 Muscle weakness (generalized): Secondary | ICD-10-CM | POA: Diagnosis not present

## 2018-11-01 ENCOUNTER — Other Ambulatory Visit: Payer: Self-pay | Admitting: Internal Medicine

## 2018-11-05 DIAGNOSIS — M6281 Muscle weakness (generalized): Secondary | ICD-10-CM | POA: Diagnosis not present

## 2018-11-05 DIAGNOSIS — M5136 Other intervertebral disc degeneration, lumbar region: Secondary | ICD-10-CM | POA: Diagnosis not present

## 2018-11-07 DIAGNOSIS — M5136 Other intervertebral disc degeneration, lumbar region: Secondary | ICD-10-CM | POA: Diagnosis not present

## 2018-11-07 DIAGNOSIS — M6281 Muscle weakness (generalized): Secondary | ICD-10-CM | POA: Diagnosis not present

## 2018-11-07 DIAGNOSIS — M1711 Unilateral primary osteoarthritis, right knee: Secondary | ICD-10-CM | POA: Diagnosis not present

## 2018-11-12 DIAGNOSIS — M5136 Other intervertebral disc degeneration, lumbar region: Secondary | ICD-10-CM | POA: Diagnosis not present

## 2018-11-12 DIAGNOSIS — M6281 Muscle weakness (generalized): Secondary | ICD-10-CM | POA: Diagnosis not present

## 2018-11-14 DIAGNOSIS — M1711 Unilateral primary osteoarthritis, right knee: Secondary | ICD-10-CM | POA: Diagnosis not present

## 2018-11-14 DIAGNOSIS — M5136 Other intervertebral disc degeneration, lumbar region: Secondary | ICD-10-CM | POA: Diagnosis not present

## 2018-11-14 DIAGNOSIS — M6281 Muscle weakness (generalized): Secondary | ICD-10-CM | POA: Diagnosis not present

## 2018-11-19 DIAGNOSIS — M5136 Other intervertebral disc degeneration, lumbar region: Secondary | ICD-10-CM | POA: Diagnosis not present

## 2018-11-19 DIAGNOSIS — M6281 Muscle weakness (generalized): Secondary | ICD-10-CM | POA: Diagnosis not present

## 2018-11-21 DIAGNOSIS — M1711 Unilateral primary osteoarthritis, right knee: Secondary | ICD-10-CM | POA: Diagnosis not present

## 2018-11-27 DIAGNOSIS — M6281 Muscle weakness (generalized): Secondary | ICD-10-CM | POA: Diagnosis not present

## 2018-11-27 DIAGNOSIS — M5136 Other intervertebral disc degeneration, lumbar region: Secondary | ICD-10-CM | POA: Diagnosis not present

## 2018-12-04 DIAGNOSIS — M6281 Muscle weakness (generalized): Secondary | ICD-10-CM | POA: Diagnosis not present

## 2018-12-04 DIAGNOSIS — M5136 Other intervertebral disc degeneration, lumbar region: Secondary | ICD-10-CM | POA: Diagnosis not present

## 2018-12-11 DIAGNOSIS — M5136 Other intervertebral disc degeneration, lumbar region: Secondary | ICD-10-CM | POA: Diagnosis not present

## 2018-12-11 DIAGNOSIS — M6281 Muscle weakness (generalized): Secondary | ICD-10-CM | POA: Diagnosis not present

## 2018-12-18 DIAGNOSIS — M79671 Pain in right foot: Secondary | ICD-10-CM | POA: Diagnosis not present

## 2018-12-18 DIAGNOSIS — M2012 Hallux valgus (acquired), left foot: Secondary | ICD-10-CM | POA: Diagnosis not present

## 2018-12-18 DIAGNOSIS — M79672 Pain in left foot: Secondary | ICD-10-CM | POA: Diagnosis not present

## 2018-12-25 DIAGNOSIS — G20 Parkinson's disease: Secondary | ICD-10-CM | POA: Diagnosis not present

## 2019-01-01 DIAGNOSIS — M5136 Other intervertebral disc degeneration, lumbar region: Secondary | ICD-10-CM | POA: Diagnosis not present

## 2019-01-01 DIAGNOSIS — M6281 Muscle weakness (generalized): Secondary | ICD-10-CM | POA: Diagnosis not present

## 2019-01-16 DIAGNOSIS — M6281 Muscle weakness (generalized): Secondary | ICD-10-CM | POA: Diagnosis not present

## 2019-01-16 DIAGNOSIS — M5136 Other intervertebral disc degeneration, lumbar region: Secondary | ICD-10-CM | POA: Diagnosis not present

## 2019-01-21 DIAGNOSIS — M5136 Other intervertebral disc degeneration, lumbar region: Secondary | ICD-10-CM | POA: Diagnosis not present

## 2019-01-21 DIAGNOSIS — M6281 Muscle weakness (generalized): Secondary | ICD-10-CM | POA: Diagnosis not present

## 2019-01-28 ENCOUNTER — Other Ambulatory Visit: Payer: Self-pay | Admitting: Internal Medicine

## 2019-01-28 DIAGNOSIS — M6281 Muscle weakness (generalized): Secondary | ICD-10-CM | POA: Diagnosis not present

## 2019-01-28 DIAGNOSIS — M5136 Other intervertebral disc degeneration, lumbar region: Secondary | ICD-10-CM | POA: Diagnosis not present

## 2019-03-12 ENCOUNTER — Encounter: Payer: Self-pay | Admitting: Internal Medicine

## 2019-03-12 ENCOUNTER — Ambulatory Visit (INDEPENDENT_AMBULATORY_CARE_PROVIDER_SITE_OTHER): Payer: Medicare PPO | Admitting: Internal Medicine

## 2019-03-12 ENCOUNTER — Other Ambulatory Visit: Payer: Self-pay

## 2019-03-12 VITALS — BP 155/85 | HR 76 | Temp 97.9°F | Ht 72.0 in | Wt 210.0 lb

## 2019-03-12 DIAGNOSIS — N529 Male erectile dysfunction, unspecified: Secondary | ICD-10-CM | POA: Diagnosis not present

## 2019-03-12 DIAGNOSIS — I6529 Occlusion and stenosis of unspecified carotid artery: Secondary | ICD-10-CM

## 2019-03-12 DIAGNOSIS — I10 Essential (primary) hypertension: Secondary | ICD-10-CM

## 2019-03-12 DIAGNOSIS — Z79899 Other long term (current) drug therapy: Secondary | ICD-10-CM | POA: Diagnosis not present

## 2019-03-12 DIAGNOSIS — G20 Parkinson's disease: Secondary | ICD-10-CM | POA: Diagnosis not present

## 2019-03-12 DIAGNOSIS — G4700 Insomnia, unspecified: Secondary | ICD-10-CM | POA: Diagnosis not present

## 2019-03-12 DIAGNOSIS — Z1211 Encounter for screening for malignant neoplasm of colon: Secondary | ICD-10-CM

## 2019-03-12 MED ORDER — LOSARTAN POTASSIUM 25 MG PO TABS: 75.0000 mg | ORAL_TABLET | Freq: Every day | ORAL | 3 refills | Status: DC

## 2019-03-12 MED ORDER — SILDENAFIL CITRATE 25 MG PO TABS: 50.0000 mg | ORAL_TABLET | Freq: Every day | ORAL | 0 refills | Status: DC | PRN

## 2019-03-12 NOTE — Assessment & Plan Note (Signed)
Erectile dysfunction: Patient is currently engaged with plans to be married in the near future as COVID permits.  He states that he is able to tolerate 2 flights of stairs without notable shortness of breath, walks daily.  He has a history of carotid artery stenosis but denies a history of coronary artery disease, chest pain or dyspnea.  Plan:  Refilled sildenafil 50 mg as needed.

## 2019-03-12 NOTE — Patient Instructions (Addendum)
FOLLOW-UP INSTRUCTIONS When: ~2 months For: Blood pressure check and appointment with Dr. Berline Lopes to discuss any potential medication changes  What to bring: All of your medications  Please take 75mg  of your Losartan daily for two weeks. If you notice any increase in lightheadedness or diziness on standing please stop taking the higher dose and notify us of your symptoms. If you tolerate this increased dose well please continue to take it until our next visit.   Today we discussed your high blood pressure, your parkinson's like illness and the cool feeling you experience at night. I will notify you of the results of any labs from today's evaluation when available to me.   Thank you for your visit to the [facility] today. If you have any questions or concerns please call us at [phone].

## 2019-03-12 NOTE — Assessment & Plan Note (Signed)
Parkinsons: Was seen by [facility] Neurology. He is uncertain about the degree of thoroughness demonstrated by the Neurologist and feels that he may be better served by his former physician. I encouraged him to reconsider as the plan and assessment I read seemed reasonable. He will consider giving him a second chance.  With regard to his symptoms, he does not feel they are notably worse with the exception of a mild leg tremor that is most notable mid morning as his last does of Sinemet is wearing off. I advised him to discuss this with his neurologist on September 9th. The patient also notes progressively worsening insomnia.  I feel this is most likely related to his Parkinson's-like illness and will also be best followed by his neurologist. Patient endorses sensation of feeling cold in the early morning hours.  I feel this is secondary to multiple factors including decreasing body temperature, decreased ambient temperature, autonomic dysregulation, insomnia with increased cognizance of his mild decrease in body temperature.  He will mention this to his neurologist.  Plan: Continue Sinemet, Remeron, and Nepro

## 2019-03-12 NOTE — Assessment & Plan Note (Signed)
Hypertension: Patient's initial BP today was 195/75 with repeat at 155/75 and a goal of <150/80. Given her parkinsons related orthostatic hypotension I feel his current blood pressure is near the acceptable range. The patient endorses adherence to his medication regimen. He denied, chest pain, headache, visual changes, lightheadedness, weakness, dizziness on standing, swelling in the feet or ankles.   Plan: Continue losartan but increase from 50mg  to 75mg  daily. He is to call back if this induced symptoms.  BMP today Return in 2 months

## 2019-03-12 NOTE — Progress Notes (Signed)
   CC: HTN and parkinsons disease  HPI:[name] is a 73 y.o. male who presents for evaluation of HTN. Please see individual problem based A/P for details.  Depression, PHQ-9: Based on the patients    Office Visit from 03/12/2019 in [place]  PHQ-9 Total Score  4     score we have monitor.  Past Medical History:  Diagnosis Date  . Carotid artery stenosis 04/29/2018  . Chronic tension headaches 04/29/2018  . Essential hypertension 04/29/2018  . GERD (gastroesophageal reflux disease) 04/29/2018  . HLD (hyperlipidemia) 04/29/2018  . Parkinson's disease ([place]) 04/29/2018   Review of Systems:  ROS negative except as per HPI.  Physical Exam: Vitals:   03/12/19 1406 03/12/19 1455  BP: (!) 195/75 (!) 155/85  Pulse: 75 76  Temp: 97.9 F (36.6 C)   TempSrc: Oral   SpO2: 98% 97%  Weight: 210 lb (95.3 kg)   Height: 6' (1.829 m)    General: A/O x4, in no acute distress, afebrile, nondiaphoretic HEENT: PEERL, EMO intact Cardio: RRR, no mrg's  Pulmonary: CTA bilaterally, no wheezing or crackles  Abdomen: Bowel sounds normal, soft, nontender  MSK: BLE nontender, nonedematous Neuro: Alert, CNII-XII grossly intact, conversational, strength 5/5 in the upper and lower extremities bilaterally, normal gait, patient has increased tone in all 4 extremities, there is a mild pill-rolling tremor at rest. Psych: Appropriate affect, not depressed in appearance, engages well  Assessment & Plan:   See Encounters Tab for problem based charting.  Patient discussed with Dr. Evette Doffing

## 2019-03-12 NOTE — Assessment & Plan Note (Signed)
  Colon Cancer screening: The patient is unlikely to benefit from additional screening. He recalls that his most recent colonoscopy was read as normal.  He does not recall the recommended follow-up.

## 2019-03-13 LAB — BMP8+ANION GAP
Anion Gap: 16 mmol/L (ref 10.0–18.0)
BUN/Creatinine Ratio: 15 (ref 10–24)
BUN: 11 mg/dL (ref 8–27)
CO2: 23 mmol/L (ref 20–29)
Calcium: 8.8 mg/dL (ref 8.6–10.2)
Chloride: 102 mmol/L (ref 96–106)
Creatinine, Ser: 0.73 mg/dL — ABNORMAL LOW (ref 0.76–1.27)
GFR calc Af Amer: 107 mL/min/{1.73_m2} (ref >59)
GFR calc non Af Amer: 93 mL/min/{1.73_m2} (ref >59)
Glucose: 109 mg/dL — ABNORMAL HIGH (ref 65–99)
Potassium: 4.5 mmol/L (ref 3.5–5.2)
Sodium: 141 mmol/L (ref 134–144)

## 2019-03-13 NOTE — Progress Notes (Signed)
Internal Medicine Clinic Attending  Case discussed with Dr. Harbrecht at the time of the visit.  We reviewed the resident's history and exam and pertinent patient test results.  I agree with the assessment, diagnosis, and plan of care documented in the resident's note.   

## 2019-03-13 NOTE — Addendum Note (Signed)
Addended by: Lalla Brothers T on: 03/13/2019 04:05 PM   Modules accepted: Level of Service

## 2019-03-18 ENCOUNTER — Telehealth: Payer: Self-pay

## 2019-03-18 NOTE — Telephone Encounter (Signed)
Requesting to speak with about   losartan (COZAAR) 25 MG tablet   Please call back.

## 2019-03-18 NOTE — Telephone Encounter (Signed)
Pt states he remembered that his neurologist told him that BP meds can interfere with his parkinson's. He would like to drop back to his 50mg  of losartan. He has an appt with the neuro 9/9 in [place] he did not like dr siddiqui so he called his neuro from [place] and is going to continue seeing him. He will have that dr fax notes to clinic. He also may end up moving back to [place]. hes not sure yet Please call pt or advise on losartan

## 2019-04-02 DIAGNOSIS — G20 Parkinson's disease: Secondary | ICD-10-CM | POA: Diagnosis not present

## 2019-04-05 NOTE — Telephone Encounter (Signed)
I agree with this change.

## 2019-04-22 ENCOUNTER — Telehealth: Payer: Self-pay | Admitting: Internal Medicine

## 2019-04-22 NOTE — Telephone Encounter (Signed)
Returned call to patient. States he was out of town for a few days and BP was 218/90-95. When he returned home BP has been ranging 131-190/64-90; last BP this AM 145/75. States he takes losartan every evening at 10 PM and has not missed any doses. Also, c/o dull h/a x 2 days; rates 5-6/10 at present. Took 3 baby aspirin this AM without relief and aleve this afternoon with minimal relief. Denies CP but notes, "a little flutter in my chest." Also states he woke up exhausted today. Appt given tomorrow in [facility] at 1045. Patient instructed not to take additional doses of baby aspirin but may try acetaminophen. Instructed to head to ED if pain worsens, or develops CP, SHOB. States he will L. Lynzy Rawles, BSN, RN-BC

## 2019-04-22 NOTE — Telephone Encounter (Signed)
Pt requesting a call back about his BP. Pt states his bp has been up and down.  The highest @  218-90 and the Lowest @ 131/64.

## 2019-04-23 ENCOUNTER — Other Ambulatory Visit: Payer: Self-pay

## 2019-04-23 ENCOUNTER — Ambulatory Visit (INDEPENDENT_AMBULATORY_CARE_PROVIDER_SITE_OTHER): Payer: Medicare PPO | Admitting: Internal Medicine

## 2019-04-23 ENCOUNTER — Encounter: Payer: Self-pay | Admitting: Internal Medicine

## 2019-04-23 VITALS — BP 158/82 | HR 71 | Temp 97.8°F | Ht 72.0 in | Wt 208.7 lb

## 2019-04-23 DIAGNOSIS — Z79899 Other long term (current) drug therapy: Secondary | ICD-10-CM

## 2019-04-23 DIAGNOSIS — I10 Essential (primary) hypertension: Secondary | ICD-10-CM

## 2019-04-23 DIAGNOSIS — M67911 Unspecified disorder of synovium and tendon, right shoulder: Secondary | ICD-10-CM | POA: Diagnosis not present

## 2019-04-23 NOTE — Progress Notes (Signed)
Internal Medicine Clinic Attending  Case discussed with Dr. Winfrey  at the time of the visit.  We reviewed the resident's history and exam and pertinent patient test results.  I agree with the assessment, diagnosis, and plan of care documented in the resident's note.  

## 2019-04-23 NOTE — Assessment & Plan Note (Signed)
Headaches have improved since increased dose of losartan and overall blood pressure beginning to stabilize.  We discussed that he is still having some orthostatic symptoms when getting up from the dinner table or getting up from kneeling at church. He is unsure if prior headaches related to bp or not encouraged keeping a headache diary.    -Joint decision making between the patient and I we will opt to continue losartan at current dosage 75mg 

## 2019-04-23 NOTE — Progress Notes (Signed)
   CC: HTN follow up  HPI:  Mr.Yaniel Hang is a 73 y.o. male with PMH below.  Today we will address HTN   Please see A&P for status of the patient's chronic medical conditions  Past Medical History:  Diagnosis Date  . Carotid artery stenosis 04/29/2018  . Chronic tension headaches 04/29/2018  . Essential hypertension 04/29/2018  . GERD (gastroesophageal reflux disease) 04/29/2018  . HLD (hyperlipidemia) 04/29/2018  . Parkinson's disease ([place]) 04/29/2018   Review of Systems:  Cardiac: JVD flat, normal rate and rhythm, clear s1 and s2, no murmurs, rubs or gallops, no LE edema Pulmonary: CTAB, not in distress Abdominal: non distended abdomen, soft and nontender Psych: Alert, conversant, in good spirits   Physical Exam:  Vitals:   04/23/19 1101  BP: (!) 158/82  Pulse: 71  Temp: 97.8 F (36.6 C)  TempSrc: Oral  SpO2: 98%  Weight: 208 lb 11.2 oz (94.7 kg)  Height: 6' (1.829 m)   Cardiac:  normal rate and rhythm, clear s1 and s2, no murmurs, rubs or gallops Pulmonary: CTAB, not in distress Abdominal: non distended abdomen, soft and nontender Psych: Alert, conversant, in good spirits   Social History   Socioeconomic History  . Marital status: Widowed    Spouse name: Not on file  . Number of children: Not on file  . Years of education: Not on file  . Highest education level: Not on file  Occupational History  . Not on file  Social Needs  . Financial resource strain: Not on file  . Food insecurity    Worry: Not on file    Inability: Not on file  . Transportation needs    Medical: Not on file    Non-medical: Not on file  Tobacco Use  . Smoking status: Never Smoker  . Smokeless tobacco: Never Used  Substance and Sexual Activity  . Alcohol use: Not on file  . Drug use: Not on file  . Sexual activity: Not on file  Lifestyle  . Physical activity    Days per week: Not on file    Minutes per session: Not on file  . Stress: Not on file  Relationships  . Social  Herbalist on phone: Not on file    Gets together: Not on file    Attends religious service: Not on file    Active member of club or organization: Not on file    Attends meetings of clubs or organizations: Not on file    Relationship status: Not on file  . Intimate partner violence    Fear of current or ex partner: Not on file    Emotionally abused: Not on file    Physically abused: Not on file    Forced sexual activity: Not on file  Other Topics Concern  . Not on file  Social History Narrative  . Not on file    No family history on file.  Assessment & Plan:   See Encounters Tab for problem based charting.  Patient discussed with Dr. Evette Doffing

## 2019-04-23 NOTE — Patient Instructions (Signed)
Terry [name] we will keep your losartan dosage the same for now and no additional blood pressure medications will be added.  You can continue to check your blood pressure periodically and when you have symptoms but I don't feel strongly about you checking it multiple times daily.

## 2019-04-23 NOTE — Telephone Encounter (Signed)
Thank you for the update. This is unfortunate. I agree that he needed to be seen in [facility].

## 2019-04-24 DIAGNOSIS — S46811D Strain of other muscles, fascia and tendons at shoulder and upper arm level, right arm, subsequent encounter: Secondary | ICD-10-CM | POA: Diagnosis not present

## 2019-04-24 DIAGNOSIS — S40011D Contusion of right shoulder, subsequent encounter: Secondary | ICD-10-CM | POA: Diagnosis not present

## 2019-04-29 DIAGNOSIS — S40011D Contusion of right shoulder, subsequent encounter: Secondary | ICD-10-CM | POA: Diagnosis not present

## 2019-04-29 DIAGNOSIS — S46811D Strain of other muscles, fascia and tendons at shoulder and upper arm level, right arm, subsequent encounter: Secondary | ICD-10-CM | POA: Diagnosis not present

## 2019-05-01 ENCOUNTER — Other Ambulatory Visit: Payer: Self-pay | Admitting: Internal Medicine

## 2019-05-05 DIAGNOSIS — S46811D Strain of other muscles, fascia and tendons at shoulder and upper arm level, right arm, subsequent encounter: Secondary | ICD-10-CM | POA: Diagnosis not present

## 2019-05-05 DIAGNOSIS — S40011D Contusion of right shoulder, subsequent encounter: Secondary | ICD-10-CM | POA: Diagnosis not present

## 2019-05-07 DIAGNOSIS — S40011D Contusion of right shoulder, subsequent encounter: Secondary | ICD-10-CM | POA: Diagnosis not present

## 2019-05-07 DIAGNOSIS — S46811D Strain of other muscles, fascia and tendons at shoulder and upper arm level, right arm, subsequent encounter: Secondary | ICD-10-CM | POA: Diagnosis not present

## 2019-05-12 DIAGNOSIS — S40011D Contusion of right shoulder, subsequent encounter: Secondary | ICD-10-CM | POA: Diagnosis not present

## 2019-05-12 DIAGNOSIS — S46811D Strain of other muscles, fascia and tendons at shoulder and upper arm level, right arm, subsequent encounter: Secondary | ICD-10-CM | POA: Diagnosis not present

## 2019-05-14 DIAGNOSIS — S40011D Contusion of right shoulder, subsequent encounter: Secondary | ICD-10-CM | POA: Diagnosis not present

## 2019-05-14 DIAGNOSIS — S46811D Strain of other muscles, fascia and tendons at shoulder and upper arm level, right arm, subsequent encounter: Secondary | ICD-10-CM | POA: Diagnosis not present

## 2019-05-19 DIAGNOSIS — S46811D Strain of other muscles, fascia and tendons at shoulder and upper arm level, right arm, subsequent encounter: Secondary | ICD-10-CM | POA: Diagnosis not present

## 2019-05-19 DIAGNOSIS — S40011D Contusion of right shoulder, subsequent encounter: Secondary | ICD-10-CM | POA: Diagnosis not present

## 2019-05-21 DIAGNOSIS — S40011D Contusion of right shoulder, subsequent encounter: Secondary | ICD-10-CM | POA: Diagnosis not present

## 2019-05-21 DIAGNOSIS — S46811D Strain of other muscles, fascia and tendons at shoulder and upper arm level, right arm, subsequent encounter: Secondary | ICD-10-CM | POA: Diagnosis not present

## 2019-05-21 DIAGNOSIS — M67911 Unspecified disorder of synovium and tendon, right shoulder: Secondary | ICD-10-CM | POA: Diagnosis not present

## 2019-05-27 DIAGNOSIS — Z6835 Body mass index (BMI) 35.0-35.9, adult: Secondary | ICD-10-CM | POA: Diagnosis not present

## 2019-05-27 DIAGNOSIS — M25561 Pain in right knee: Secondary | ICD-10-CM | POA: Diagnosis not present

## 2019-05-28 ENCOUNTER — Encounter: Payer: Self-pay | Admitting: Internal Medicine

## 2019-05-28 ENCOUNTER — Other Ambulatory Visit: Payer: Self-pay

## 2019-05-28 ENCOUNTER — Ambulatory Visit (INDEPENDENT_AMBULATORY_CARE_PROVIDER_SITE_OTHER): Payer: Medicare PPO | Admitting: Internal Medicine

## 2019-05-28 VITALS — BP 161/62 | HR 92 | Temp 98.0°F | Ht 73.0 in | Wt 199.2 lb

## 2019-05-28 DIAGNOSIS — Z8719 Personal history of other diseases of the digestive system: Secondary | ICD-10-CM | POA: Diagnosis not present

## 2019-05-28 DIAGNOSIS — Z86010 Personal history of colonic polyps: Secondary | ICD-10-CM | POA: Diagnosis not present

## 2019-05-28 DIAGNOSIS — Z79899 Other long term (current) drug therapy: Secondary | ICD-10-CM | POA: Diagnosis not present

## 2019-05-28 DIAGNOSIS — N529 Male erectile dysfunction, unspecified: Secondary | ICD-10-CM

## 2019-05-28 DIAGNOSIS — I10 Essential (primary) hypertension: Secondary | ICD-10-CM | POA: Diagnosis not present

## 2019-05-28 DIAGNOSIS — G20 Parkinson's disease: Secondary | ICD-10-CM | POA: Diagnosis not present

## 2019-05-28 DIAGNOSIS — Z1211 Encounter for screening for malignant neoplasm of colon: Secondary | ICD-10-CM

## 2019-05-28 MED ORDER — LOSARTAN POTASSIUM 100 MG PO TABS: 100.0000 mg | ORAL_TABLET | Freq: Every day | ORAL | 0 refills | Status: DC

## 2019-05-28 MED ORDER — TADALAFIL 10 MG PO TABS: 10.0000 mg | ORAL_TABLET | ORAL | 0 refills | Status: DC | PRN

## 2019-05-28 NOTE — Patient Instructions (Signed)
FOLLOW-UP INSTRUCTIONS When: 3-4 months For:  What to bring: All of your medications  I have increased your Losartan to 100mg  from 75mg  today. Please cautiously add this dose to your medication regimen. I would advise taking the 100mg  every other day for three days before going to the full dose daily. I agree with tapering the klonopin as well. Please use the Cialis cautiously, if you have chest pain, feel short of breath or have a headache please call 911 while using.   Thank you for your visit to the [facility] today. If you have any questions or concerns please call us at [phone].

## 2019-05-28 NOTE — Progress Notes (Signed)
   CC: HTN and Parkinsons  HPI:[name] is a 73 y.o. male who presents for evaluation of HTN and Parkinson diease. Please see individual problem based A/P for details.   Past Medical History:  Diagnosis Date  . Carotid artery stenosis 04/29/2018  . Chronic tension headaches 04/29/2018  . Essential hypertension 04/29/2018  . GERD (gastroesophageal reflux disease) 04/29/2018  . HLD (hyperlipidemia) 04/29/2018  . Parkinson's disease ([place]) 04/29/2018   Review of Systems:  ROS negative except as per HPI.  Physical Exam: Vitals:   05/28/19 1437  BP: (!) 161/62  Pulse: 92  Temp: 98 F (36.7 C)  TempSrc: Oral  SpO2: 97%  Weight: 199 lb 3.2 oz (90.4 kg)  Height: 6\' 1"  (1.854 m)   General: A/O x4, in no acute distress, afebrile, nondiaphoretic Cardio: RRR, no mrg's  Pulmonary: CTA bilaterally, no wheezing or crackles  MSK: BLE nontender, nonedematous Neuro: Alert, oral dyskinesia observed, gait decreased speed Psych: Appropriate affect, not depressed in appearance, engages well  Assessment & Plan:   See Encounters Tab for problem based charting.  Patient discussed with Dr. Dareen Piano

## 2019-05-29 ENCOUNTER — Encounter: Payer: Self-pay | Admitting: Internal Medicine

## 2019-05-29 NOTE — Assessment & Plan Note (Signed)
Hypertension: Patient's BP today is 161/62 with a goal of <155/90. The patient endorses adherence to his medication regimen. He denied, chest pain, headache, visual changes, lightheadedness, weakness, dizziness on standing, swelling in the feet or ankles. He will at times experiences episodes of hypotension that are symptomatic consistent with parkinson related orthostatics. He presents a blood pressure journal with him today that demonstrates several measurements in the 326'Z systolic, ranging from 124'P to 809 systolic. His diastolic ranges from 98-338. These were taken at random without first resting for 5 min. Given his underlying comorbid conditions and difficulty tolerating multiple antihypertensives I feel we will need to make any adjustments cautiously. Most recent BMP with sCr of 0.73 and potassium 4.5. Both appear stable from a year prior.   Plan: Continue Losartan but increase the well tolerated dose of 75mg  daily to 100mg  daily Repeat BMP in at next visit.

## 2019-05-29 NOTE — Assessment & Plan Note (Signed)
Colon cancer screening: 2005 in [facility] had a diverticulitis episode. Stated that he was informed after his last colonoscopy that he would not need a repeat in 2014 but that polyps were present. I have discussed the risks vs benefit but without known the exact nature of the polyps I can not say with certainty if the benefit does indeed outweigh the risks. We will need to obtain a release of information and determine where the colonoscopies were performed.

## 2019-05-29 NOTE — Assessment & Plan Note (Signed)
Erectile dysfunction: Continue to have difficulty with Maintaining an erection to the extent that is beneficial. I feel this is most likely 2/2 to his parkinson disease. He would prefer Cialis today over sildenafil. I have cautioned him that this can also lead to hypotension and worsen his orthostatic symptoms, another reason to maintain a degree of mild hypertension.   Plan:  Tadalafil 10mg  PRN

## 2019-05-29 NOTE — Assessment & Plan Note (Signed)
Parkinson's disease: Terry [name] has been to visit his Neurologist Dr. Olegario Messier. Per his note dated 04/02/2019 indicates marked symptomatic disease progression. I would concur that the patients symptoms have progressed. There appears to be a degree of oral dyskinesia today of which the patient feels this has been present for several months. He continues on his Carvidopa/levodopa 25-100 patient taking Take 2.5 tablets at 7am, 2 tablets at 12noon, 2 tablets at 5pm, 2 tablets at 10pm as well as Carvidopa/levodopa CR 50/200mg  at bed.  Plan: Continue to follow with his neurologist Continue current treatment with Neupro patch and Sinemet.

## 2019-05-30 NOTE — Progress Notes (Signed)
Internal Medicine Clinic Attending  Case discussed with Dr. Harbrecht at the time of the visit.  We reviewed the resident's history and exam and pertinent patient test results.  I agree with the assessment, diagnosis, and plan of care documented in the resident's note.   

## 2019-06-24 DIAGNOSIS — M1711 Unilateral primary osteoarthritis, right knee: Secondary | ICD-10-CM | POA: Diagnosis not present

## 2019-06-29 ENCOUNTER — Encounter: Payer: Self-pay | Admitting: Internal Medicine

## 2019-06-29 DIAGNOSIS — I10 Essential (primary) hypertension: Secondary | ICD-10-CM

## 2019-07-01 DIAGNOSIS — M1711 Unilateral primary osteoarthritis, right knee: Secondary | ICD-10-CM | POA: Diagnosis not present

## 2019-07-03 MED ORDER — AMLODIPINE BESYLATE 2.5 MG PO TABS: 2.5000 mg | ORAL_TABLET | Freq: Every day | ORAL | 0 refills | Status: DC

## 2019-07-08 DIAGNOSIS — M1711 Unilateral primary osteoarthritis, right knee: Secondary | ICD-10-CM | POA: Diagnosis not present

## 2019-07-09 ENCOUNTER — Telehealth: Payer: Self-pay | Admitting: Internal Medicine

## 2019-07-09 NOTE — Telephone Encounter (Signed)
I called and spoke to the patient. I informed him that I realize Amlodipine may potentially interact with Levodopa but that we are greatly limited given his disease process, orthostatic hypotension and related limitations. Diuretics are ill advised absent a primary indication. Beta blockers may reinforce the preexisting adrenergic deficit. It is also important to avoid hypotension both sitting and supine which is a common occurrence in PD patients.  The patient reported that his last BP today was 179 over 80's. It should be noted that he has been taking his BP readings immediatly after sitting. I have therefore requested that he record his BP after sitting for five minutes. He sill also record his BP after lying supine for at least five minutes. We will follow-up on these readings and discuss a course from there.  He denied headache, visual changes, chest pain, or shortness of breath.  He has continued taking the Losartan 100mg  daily and notes a more normal BP reading with the cialis.

## 2019-07-09 NOTE — Telephone Encounter (Signed)
Called [facility] pharm, amlodipine is ready for pickup, they will text him again and let him know. Called pt lm for rtc

## 2019-07-09 NOTE — Telephone Encounter (Signed)
Please call patient back about his medication  amLODipine (NORVASC) 2.5 MG tablet  [id number] - [place], [place]

## 2019-07-10 ENCOUNTER — Telehealth: Payer: Self-pay | Admitting: Internal Medicine

## 2019-07-10 NOTE — Telephone Encounter (Signed)
I called Terry [name] today. He stated that his BP was improved. He obtained a reading of: Standing 84/51 Sitting 121/66 Lying 108/47 Standing 98/59 Sitting127/60 Lying 128/63 This is while the Cialis is still in his system.  I discussed cessation of the Cialis. He will continue measure his BP over the next several days and notify us of the results on Monday.

## 2019-07-15 ENCOUNTER — Encounter: Payer: Self-pay | Admitting: Internal Medicine

## 2019-07-16 ENCOUNTER — Telehealth: Payer: Self-pay | Admitting: Internal Medicine

## 2019-07-16 DIAGNOSIS — N529 Male erectile dysfunction, unspecified: Secondary | ICD-10-CM

## 2019-07-16 MED ORDER — TADALAFIL 2.5 MG PO TABS: 2.5000 mg | ORAL_TABLET | Freq: Every day | ORAL | 1 refills | Status: DC

## 2019-07-16 NOTE — Telephone Encounter (Signed)
I called and spoke with the patient who stated that he was feeling better overall. Given his recorded blood pressure readings I do not feel that. BP is better, except a little high a couple of times.  See below:  Date   time    sitting 12:16 6:30PM        121/66 12:17 8:00AM        127/60 12:17 11:00PM      185/81 12:18 8:00AM        134/63 12:18 8:00PM        154/76 12/18 11:00PM      157/82 12:19 7:00AM        160/81 12:19 9:30PM        169/79 12:20 1:00PM        187/87 12:20 8:00PM        166/90 12:21 7:30PM        149/81 12:22 11:00PM      149/77 He continues to feel better overall when taking the cialis 2.5mg  dose and notes an improvement in his blood pressure as well. Given the blood pressure fluctuations I am not certain that continuing the 10mg  PRN dose is recommended. Additionally, in the setting of parkinsons related illnesses blood pressure regulation is limited to a few select medication reducing our options. Orthostatic hypotension is a major concern and needs to be avoided. He does appear to have a more stable BP while on cialis but there appears to be some what of a rebound effect when this wears off.   Plan: We will initiate treatment of his ED with the 2.5mg  daily cialis dosing regimen in an effort to avoid the wide blood pressure fluctuations that have been noted while taking the 10mg  PRN dose.  He has agreed to continue monitoring his BP as he has been to make certain it is not dropping too low. A new script has been sent to the [facility].

## 2019-08-01 ENCOUNTER — Encounter: Payer: Self-pay | Admitting: Internal Medicine

## 2019-08-03 ENCOUNTER — Other Ambulatory Visit: Payer: Self-pay | Admitting: Internal Medicine

## 2019-08-04 DIAGNOSIS — H524 Presbyopia: Secondary | ICD-10-CM | POA: Diagnosis not present

## 2019-08-04 DIAGNOSIS — H52223 Regular astigmatism, bilateral: Secondary | ICD-10-CM | POA: Diagnosis not present

## 2019-08-04 DIAGNOSIS — H35371 Puckering of macula, right eye: Secondary | ICD-10-CM | POA: Diagnosis not present

## 2019-08-04 DIAGNOSIS — H5213 Myopia, bilateral: Secondary | ICD-10-CM | POA: Diagnosis not present

## 2019-08-08 DIAGNOSIS — N5201 Erectile dysfunction due to arterial insufficiency: Secondary | ICD-10-CM | POA: Diagnosis not present

## 2019-08-15 ENCOUNTER — Encounter: Payer: Self-pay | Admitting: Internal Medicine

## 2019-08-21 DIAGNOSIS — H6123 Impacted cerumen, bilateral: Secondary | ICD-10-CM | POA: Diagnosis not present

## 2019-08-21 DIAGNOSIS — H6983 Other specified disorders of Eustachian tube, bilateral: Secondary | ICD-10-CM | POA: Diagnosis not present

## 2019-08-26 ENCOUNTER — Other Ambulatory Visit: Payer: Self-pay | Admitting: Internal Medicine

## 2019-08-26 DIAGNOSIS — I10 Essential (primary) hypertension: Secondary | ICD-10-CM

## 2019-08-26 NOTE — Telephone Encounter (Signed)
Received refill request from pharmacy, forwarded to PCP. SChaplin, RN,BSN

## 2019-08-26 NOTE — Telephone Encounter (Signed)
Need refill on losartan (COZAAR) 100 MG tablet ;pt contact [phone]   [facility] [address] [place] - [address]

## 2019-08-31 ENCOUNTER — Ambulatory Visit: Payer: Medicare PPO | Attending: Internal Medicine

## 2019-08-31 DIAGNOSIS — Z23 Encounter for immunization: Secondary | ICD-10-CM

## 2019-08-31 NOTE — Progress Notes (Signed)
   Covid-19 Vaccination Clinic  Name:  Alireza Pollack    MRN: [id number] DOB: 1946/04/04  08/31/2019  Mr. Leasure was observed post Covid-19 immunization for 15 minutes without incidence. He was provided with Vaccine Information Sheet and instruction to access the V-Safe system.   Mr. Kutzer was instructed to call 911 with any severe reactions post vaccine: Marland Kitchen Difficulty breathing  . Swelling of your face and throat  . A fast heartbeat  . A bad rash all over your body  . Dizziness and weakness    Immunizations Administered    Name Date Dose VIS Date Route   Pfizer COVID-19 Vaccine 08/31/2019  2:51 PM 0.3 mL 07/04/2019 Intramuscular   Manufacturer: [employer], [employer]   Lot: [id number]   NDC: [address]-9

## 2019-09-10 DIAGNOSIS — G20 Parkinson's disease: Secondary | ICD-10-CM | POA: Diagnosis not present

## 2019-09-10 DIAGNOSIS — H35371 Puckering of macula, right eye: Secondary | ICD-10-CM | POA: Diagnosis not present

## 2019-09-10 DIAGNOSIS — Z961 Presence of intraocular lens: Secondary | ICD-10-CM | POA: Diagnosis not present

## 2019-09-10 DIAGNOSIS — H26492 Other secondary cataract, left eye: Secondary | ICD-10-CM | POA: Diagnosis not present

## 2019-09-12 DIAGNOSIS — N5201 Erectile dysfunction due to arterial insufficiency: Secondary | ICD-10-CM | POA: Diagnosis not present

## 2019-09-15 ENCOUNTER — Ambulatory Visit: Payer: Medicare PPO

## 2019-09-21 ENCOUNTER — Encounter: Payer: Self-pay | Admitting: Internal Medicine

## 2019-09-22 ENCOUNTER — Other Ambulatory Visit: Payer: Self-pay | Admitting: Internal Medicine

## 2019-09-25 ENCOUNTER — Ambulatory Visit: Payer: Medicare PPO | Attending: Internal Medicine

## 2019-09-25 DIAGNOSIS — Z23 Encounter for immunization: Secondary | ICD-10-CM

## 2019-09-25 NOTE — Progress Notes (Signed)
   Covid-19 Vaccination Clinic  Name:  [name]    MRN: [id number] DOB: 1946-02-14  09/25/2019  [name] was observed post Covid-19 immunization for 15 minutes without incident. He was provided with Vaccine Information Sheet and instruction to access the V-Safe system.   [name] was instructed to call 911 with any severe reactions post vaccine: Marland Kitchen Difficulty breathing  . Swelling of face and throat  . A fast heartbeat  . A bad rash all over body  . Dizziness and weakness   Immunizations Administered    Name Date Dose VIS Date Route   Pfizer COVID-19 Vaccine 09/25/2019  1:27 PM 0.3 mL 07/04/2019 Intramuscular   Manufacturer: [employer], [employer]   Lot: [id number]   NDC: [address]-6

## 2019-09-30 DIAGNOSIS — Z79899 Other long term (current) drug therapy: Secondary | ICD-10-CM | POA: Diagnosis not present

## 2019-09-30 DIAGNOSIS — G20 Parkinson's disease: Secondary | ICD-10-CM | POA: Diagnosis not present

## 2019-10-08 ENCOUNTER — Encounter: Payer: Self-pay | Admitting: Internal Medicine

## 2019-10-08 ENCOUNTER — Ambulatory Visit (INDEPENDENT_AMBULATORY_CARE_PROVIDER_SITE_OTHER): Payer: Medicare PPO | Admitting: Internal Medicine

## 2019-10-08 ENCOUNTER — Ambulatory Visit (HOSPITAL_COMMUNITY)
Admission: RE | Admit: 2019-10-08 | Discharge: 2019-10-08 | Disposition: A | Discharge status: Home / Self Care | Payer: Medicare PPO | Source: Ambulatory Visit | Attending: Internal Medicine | Admitting: Internal Medicine

## 2019-10-08 VITALS — BP 163/70 | HR 70 | Temp 97.9°F | Ht 73.0 in | Wt 191.0 lb

## 2019-10-08 DIAGNOSIS — I440 Atrioventricular block, first degree: Secondary | ICD-10-CM

## 2019-10-08 DIAGNOSIS — Z1211 Encounter for screening for malignant neoplasm of colon: Secondary | ICD-10-CM

## 2019-10-08 DIAGNOSIS — R9431 Abnormal electrocardiogram [ECG] [EKG]: Secondary | ICD-10-CM | POA: Diagnosis not present

## 2019-10-08 DIAGNOSIS — I10 Essential (primary) hypertension: Secondary | ICD-10-CM

## 2019-10-08 DIAGNOSIS — R0602 Shortness of breath: Secondary | ICD-10-CM | POA: Insufficient documentation

## 2019-10-08 DIAGNOSIS — R079 Chest pain, unspecified: Secondary | ICD-10-CM

## 2019-10-08 DIAGNOSIS — N529 Male erectile dysfunction, unspecified: Secondary | ICD-10-CM

## 2019-10-08 DIAGNOSIS — G20 Parkinson's disease: Secondary | ICD-10-CM

## 2019-10-08 DIAGNOSIS — R0789 Other chest pain: Secondary | ICD-10-CM | POA: Insufficient documentation

## 2019-10-08 DIAGNOSIS — Z79899 Other long term (current) drug therapy: Secondary | ICD-10-CM

## 2019-10-08 MED ORDER — ISRADIPINE 2.5 MG PO CAPS: 2.5000 mg | ORAL_CAPSULE | Freq: Every day | ORAL | 1 refills | Status: DC

## 2019-10-08 MED ORDER — CLONIDINE HCL 0.1 MG PO TABS: 0.1000 mg | ORAL_TABLET | Freq: Two times a day (BID) | ORAL | 2 refills | Status: DC

## 2019-10-08 NOTE — Patient Instructions (Signed)
FOLLOW-UP INSTRUCTIONS When: In 1-2 months or sooner if needed. What to bring: All of your medications  Today we discussed your chest tightness, shortness of breath, high blood pressure and how this is treated with underlying Parkinson's. As a result of your Parkinson's we will need to start a low dose Clonidine 0.1mg  two times per day after meals. Please continue to log your blood pressure.  I have referred you to cardiology, obtained and EKG and ordered and Echo of the heart for the shortness of breath and chest tightness. If this returns, does not improve or worsens in the meantime please call 911 and go to the ER.  Thank you for your visit to the [facility] today. If you have any questions or concerns please call us at [phone].

## 2019-10-08 NOTE — Progress Notes (Signed)
   CC: HTN and parkinsons disease  HPI:Mr.Isamar Wellbrock is a 74 y.o. male who presents for evaluation of HTN and Parkinsons disease follow-up. Please see individual problem based A/P for details.  Immunization History  Administered Date(s) Administered  . Pneumococcal Conjugate 13-Valent 08/05/2013  . Pneumococcal Polysaccharide 10/28/2015  . Tdap 08/05/2013  . Zoster 05/02/2010   Health Maintenance/Significant Tests Total Chol: 10/26/2015 Colonoscopy: 03/24/2013 UA: 10/26/2015 Bone Density: 04/24/2011 PSA: 09/10/2014  Health Maintenance Summary  Status Date  Pneumococcal Polysaccharide (PPSV23) Vaccine (Average Risk) Overdue 08/05/2014  Influenza Vaccine Next Due 03/24/2016  COLONOSCOPY Next Due 03/25/2023  Done 03/24/2013 ENDOSCOPY, COLON  ZOSTER VACCINE Completed  Done 05/02/2010 Imm Admin: Zoster  Pneumococcal Conjugate (PCV13) Vaccine (Average Risk) Completed  Done 08/05/2013 Imm Admin: Pneumococcal Conjugate 13-Valent   Past Medical History:  Diagnosis Date  . Carotid artery stenosis 04/29/2018  . Chronic tension headaches 04/29/2018  . Essential hypertension 04/29/2018  . GERD (gastroesophageal reflux disease) 04/29/2018  . HLD (hyperlipidemia) 04/29/2018  . Parkinson's disease ([facility]) 04/29/2018   Review of Systems:  ROS negative except as per HPI.  Physical Exam: Vitals:   10/08/19 1318 10/08/19 1332  BP: (!) 179/79 (!) 163/70  Pulse: 68 70  Temp: 97.9 F (36.6 C)   TempSrc: Oral   SpO2: 98%   Weight: 191 lb (86.6 kg)   Height: 6\' 1"  (1.854 m)    Filed Weights   10/08/19 1318  Weight: 191 lb (86.6 kg)   General: A/O x4, in no acute distress, afebrile, nondiaphoretic HEENT: PEERL, EMO intact Cardio: RRR, no mrg's  Pulmonary: CTA bilaterally, no wheezing or crackles  Abdomen: Bowel sounds normal, soft, nontender  MSK: BLE nontender, nonedematous Neuro: Alert, CNII-XII grossly intact, conversational, strength 5/5 in the upper and lower extremities bilaterally,  normal gait Psych: Appropriate affect, not depressed in appearance, engages well  Assessment & Plan:   See Encounters Tab for problem based charting.  Patient discussed with Dr. 10/10/19

## 2019-10-09 ENCOUNTER — Encounter: Payer: Self-pay | Admitting: Internal Medicine

## 2019-10-09 DIAGNOSIS — R0602 Shortness of breath: Secondary | ICD-10-CM | POA: Insufficient documentation

## 2019-10-09 DIAGNOSIS — R0789 Other chest pain: Secondary | ICD-10-CM | POA: Insufficient documentation

## 2019-10-09 LAB — BMP8+ANION GAP
Anion Gap: 13 mmol/L (ref 10.0–18.0)
BUN/Creatinine Ratio: 15 (ref 10–24)
BUN: 14 mg/dL (ref 8–27)
CO2: 26 mmol/L (ref 20–29)
Calcium: 9.2 mg/dL (ref 8.6–10.2)
Chloride: 102 mmol/L (ref 96–106)
Creatinine, Ser: 0.92 mg/dL (ref 0.76–1.27)
GFR calc Af Amer: 95 mL/min/{1.73_m2} (ref >59)
GFR calc non Af Amer: 82 mL/min/{1.73_m2} (ref >59)
Glucose: 89 mg/dL (ref 65–99)
Potassium: 4.5 mmol/L (ref 3.5–5.2)
Sodium: 141 mmol/L (ref 134–144)

## 2019-10-09 NOTE — Assessment & Plan Note (Addendum)
Patient advised to hold his Cialis while awaiting cardiac evaluation.  May resume if cleared by cardiology.  He is undergoing wave therapy with urology at this time.

## 2019-10-09 NOTE — Assessment & Plan Note (Signed)
Associated with chest tightness.  See chest tightness A/P from same day.  Have ordered and echocardiogram given the severity.  No associated JVD, pedal edema, nephropathy abdominal pain.

## 2019-10-09 NOTE — Assessment & Plan Note (Addendum)
The patient endorses a several week history of chest tightness associated with dyspnea that occurs irregularly with exercise and/or gentle walking.  He was able to ambulate from the parking lot to the basement [facility] where are clinic is located without experiencing symptoms today.  The symptoms are intermittent in occurrence with variable frequency.  There is occasionally in associated sharp substernal chest pain that occurs which is again not directly related to exercise but rarely occurs at rest. Given the patient's age, history of carotid artery stenosis, hypertension and hyperlipidemia he is at increased risk for cardiovascular disease and warrants further evaluation.  Plan: Will refer to cardiology Echocardiogram ordered EKG in the clinic without obvious Q waves or concerning ST deformities. First degree AV block is noted. The patient is asymptomatic currently

## 2019-10-09 NOTE — Assessment & Plan Note (Signed)
Hypertension: Patient's BP today is 163/70 with a goal of <140/80. The patient endorses adherence to his medication regimen. He denied, chest pain, headache, visual changes, lightheadedness, weakness, dizziness on standing, swelling in the feet or ankles. He stated that he stopped taking the Cialis when he began to have the chest tightness.  Her blood pressures at average to the upper airway growth depressed for 4 weeks.  He does continue to have occasional hypotensive episodes has not found a clear trigger for this yet.  We discussed the difficulty with treating hypertension and Parkinson's disease given the limitations on available medicines due to interaction with levodopa and induction of multiple side effects more commonly experienced Parkinson's patients.  Parkins patients are prone to wild fluctuations of blood pressure with diuretics and beta-blockers.  Orthostatic hypotension and supine hypertension are common occurrences.  Additionally, calcium channel blockers that are typically used are contraindicated due to interaction with levodopa.  Primary use of antihypertensive should be restricted to ARB's.  There is some evidence to support the use of clonidine postprandially which we will attempt treatment with today.  Plan: Continue Losartan 100mg  Daily Clonidine 0.1 mg twice daily following breakfast and before bed Referred to cardiology BMP today with stable creatinine 0.91 potassium 4.5.

## 2019-10-09 NOTE — Assessment & Plan Note (Signed)
Not due for colonoscopy until September 2024.

## 2019-10-10 NOTE — Progress Notes (Signed)
Internal Medicine Clinic Attending  Case discussed with Dr. Harbrecht at the time of the visit.  We reviewed the resident's history and exam and pertinent patient test results.  I agree with the assessment, diagnosis, and plan of care documented in the resident's note.   

## 2019-10-15 NOTE — Progress Notes (Signed)
Cardiology Office Note:   Date:  10/16/2019  NAME:  Terry [name]    MRN: [id number] DOB:  02/13/1946   PCP:  Lanelle Bal, MD  Cardiologist:  No primary care provider on file.   Referring MD: Earl Lagos, MD   Chief Complaint  Patient presents with  . New Patient (Initial Visit)  . Shortness of Breath  . Chest Pain    History of Present Illness:   Terry [name] is a 74 y.o. male with a hx of hypertension, hyperlipidemia, Parkinson's disease who is being seen today for the evaluation of chest pain at the request of Lanelle Bal, MD.  He reports for the past 3 to 4 months he has become progressively more short of breath and had discomfort in his chest with exertion.  He reports the symptoms always occur when he exerts himself.  Reports to be worse in the afternoon when he does heavy exertion.  He describes tightness in the central part of his chest that does go into his jaw.  Reports he gets symptoms every day.  Symptoms of shortness of breath and chest tightness last seconds and resolve very quickly after cessation of activity.  He reports he can go on and have similar pain when he restarts activity.  He has not seen anyone in the emergency room for this.  Main CVD risk factors uncontrolled hypertension.  He does have Parkinson's disease but this is well controlled.  He does take an aspirin and a statin.  There is no strong family history of heart disease, his parents died young.  He has no prior history of heart attack or stroke.  His EKG demonstrates nonspecific ST-T changes today.  Problem List 1. HTN 2. Parkinson's disease  3. HLD -2019: T chol 78, HDL 38, LDL 38, TG 34   Past Medical History: Past Medical History:  Diagnosis Date  . Carotid artery stenosis 04/29/2018  . Chronic tension headaches 04/29/2018  . Essential hypertension 04/29/2018  . GERD (gastroesophageal reflux disease) 04/29/2018  . HLD (hyperlipidemia) 04/29/2018  . Parkinson's disease (HCC)  04/29/2018    Past Surgical History: Past Surgical History:  Procedure Laterality Date  . TONSILLECTOMY      Current Medications: Current Meds  Medication Sig  . aspirin EC 81 MG tablet Take 81 mg by mouth daily.  Marland Kitchen atorvastatin (LIPITOR) 20 MG tablet TAKE ONE TABLET BY MOUTH EVERY NIGHT AT BEDTIME  . carbidopa-levodopa (SINEMET CR) 50-200 MG tablet Take 1 tablet by mouth at bedtime.  . carbidopa-levodopa (SINEMET IR) 25-100 MG tablet Take 1 tablet by mouth 5 (five) times daily.  . cetirizine (ZYRTEC) 10 MG tablet Take by mouth.  . fexofenadine (ALLEGRA) 180 MG tablet Take by mouth.  . fluticasone (FLONASE) 50 MCG/ACT nasal spray Place into the nose.  . losartan (COZAAR) 100 MG tablet TAKE ONE TABLET BY MOUTH DAILY  . mirtazapine (REMERON) 15 MG tablet TAKE 1 TABLET BY MOUTH EVERYDAY AT BEDTIME  . Multiple Vitamin (THEREMS) TABS Take by mouth.  . naproxen sodium (ALEVE) 220 MG tablet Take 220 mg by mouth 2 (two) times daily as needed.  Marland Kitchen NEUPRO 6 MG/24HR APPLY 1 PATCH ON THE SKIN DAILY  . omeprazole (PRILOSEC) 40 MG capsule TAKE ONE CAPSULE BY MOUTH DAILY  . Tadalafil (CIALIS) 2.5 MG TABS Take 1 tablet (2.5 mg total) by mouth daily.  . traZODone (DESYREL) 50 MG tablet Take 1 tablet (50 mg total) by mouth at bedtime as needed for sleep.  . [DISCONTINUED] cloNIDine (  CATAPRES) 0.1 MG tablet Take 1 tablet (0.1 mg total) by mouth 2 (two) times daily. After breakfast and again after dinner.     Allergies:    Penicillins and Azithromycin   Social History: Social History   Socioeconomic History  . Marital status: Widowed    Spouse name: Not on file  . Number of children: Not on file  . Years of education: Not on file  . Highest education level: Not on file  Occupational History  . Not on file  Tobacco Use  . Smoking status: Never Smoker  . Smokeless tobacco: Never Used  Substance and Sexual Activity  . Alcohol use: Yes    Comment: Occasionally.  . Drug use: Never  . Sexual  activity: Not on file  Other Topics Concern  . Not on file  Social History Narrative  . Not on file   Social Determinants of Health   Financial Resource Strain:   . Difficulty of Paying Living Expenses:   Food Insecurity:   . Worried About Programme researcher, broadcasting/film/video in the Last Year:   . Barista in the Last Year:   Transportation Needs:   . Freight forwarder (Medical):   Marland Kitchen Lack of Transportation (Non-Medical):   Physical Activity:   . Days of Exercise per Week:   . Minutes of Exercise per Session:   Stress:   . Feeling of Stress :   Social Connections:   . Frequency of Communication with Friends and Family:   . Frequency of Social Gatherings with Friends and Family:   . Attends Religious Services:   . Active Member of Clubs or Organizations:   . Attends Banker Meetings:   Marland Kitchen Marital Status:      Family History: The patient's family history includes Cancer in his father; Heart disease in his mother.  ROS:   All other ROS reviewed and negative. Pertinent positives noted in the HPI.     EKGs/Labs/Other Studies Reviewed:   The following studies were personally reviewed by me today:  EKG:  EKG is ordered today.  The ekg ordered today demonstrates normal sinus rhythm, heart rate 69, first-degree AV block noted to 226 ms, nonspecific ST-T changes, no evidence for infarction, and was personally reviewed by me.   Recent Labs: 10/08/2019: BUN 14; Creatinine, Ser 0.92; Potassium 4.5; Sodium 141   Recent Lipid Panel No results found for: CHOL, TRIG, HDL, CHOLHDL, VLDL, LDLCALC, LDLDIRECT  Physical Exam:   VS:  BP (!) 142/74 (BP Location: Left Arm, Patient Position: Sitting, Cuff Size: Normal)   Pulse 69   Ht 6\' 1"  (1.854 m)   Wt 189 lb (85.7 kg)   BMI 24.94 kg/m    Wt Readings from Last 3 Encounters:  10/16/19 189 lb (85.7 kg)  10/08/19 191 lb (86.6 kg)  05/28/19 199 lb 3.2 oz (90.4 kg)    General: Well nourished, well developed, in no acute  distress Heart: Atraumatic, normal size  Eyes: PEERLA, EOMI  Neck: Supple, no JVD Endocrine: No thryomegaly Cardiac: 2 out of 6 systolic ejection murmur   Lungs: Clear to auscultation bilaterally, no wheezing, rhonchi or rales  Abd: Soft, nontender, no hepatomegaly  Ext: No edema, pulses 2+ Musculoskeletal: No deformities, BUE and BLE strength normal and equal Skin: Warm and dry, no rashes   Neuro: Alert and oriented to person, place, time, and situation, CNII-XII grossly intact, no focal deficits  Psych: Normal mood and affect   ASSESSMENT:   Story  Cajamarca is a 74 y.o. male who presents for the following: 1. Coronary artery disease of native artery of native heart with stable angina pectoris (HCC)   2. Chest pain, unspecified type   3. Essential hypertension   4. Hyperlipidemia, unspecified hyperlipidemia type     PLAN:   1. Coronary artery disease of native artery of native heart with stable angina pectoris (HCC) 2. Chest pain, unspecified type -He presents with classic symptoms of stable typical angina.  He gets chest pain as well as neck pressure when exerting himself.  He also is short of breath.  Symptoms resolve within seconds.  Do not appear to be unstable.  I offered him cardiac cath versus coronary CTA.  He is opted for the less aggressive measures.  He is made a note that he would not like a pacemaker to be on any blood thinners as he does not find this in line with his goals of care.  He reports that a coronary CTA would be a better approach for him. -I will go ahead and start him on metoprolol tartrate 100 mg twice daily to see if we can lessen his symptoms of angina.  This will also help with the coronary CTA. -He will remain on aspirin and statin moving forward.  We will give him a prescription for sublingual nitroglycerin with strict return precautions to the emergency room should his pain not resolve after 3 rounds of sublingual nitros or 15 minutes of chest pain.  3.  Essential hypertension -We will stop his clonidine -He will take metoprolol tartrate 100 mg twice daily. -He will remain on his losartan.  4. Hyperlipidemia, unspecified hyperlipidemia type -His LDL was 38 2019.  We will continue Lipitor 20 mg daily.  Disposition: Return in about 3 months (around 01/16/2020).  Medication Adjustments/Labs and Tests Ordered: Current medicines are reviewed at length with the patient today.  Concerns regarding medicines are outlined above.  No orders of the defined types were placed in this encounter.  No orders of the defined types were placed in this encounter.   Patient Instructions  Medication Instructions:  Start Metoprolol Tartrate 100 mg twice daily  Start Nitroglycerin as needed for chest pain  Stop Clonidine  *If you need a refill on your cardiac medications before your next appointment, please call your pharmacy*   Lab Work: BMET today   If you have labs (blood work) drawn today and your tests are completely normal, you will receive your results only by: Marland Kitchen MyChart Message (if you have MyChart) OR . A paper copy in the mail If you have any lab test that is abnormal or we need to change your treatment, we will call you to review the results.   Testing/Procedures: Echocardiogram - Your physician has requested that you have an echocardiogram. Echocardiography is a painless test that uses sound waves to create images of your heart. It provides your doctor with information about the size and shape of your heart and how well your heart's chambers and valves are working. This procedure takes approximately one hour. There are no restrictions for this procedure. This will be performed at our [facility] location - [address], [address].  Your physician has requested that you have cardiac CT. Cardiac computed tomography (CT) is a painless test that uses an x-ray machine to take clear, detailed pictures of your heart. For further information please  visit [email or web address] Please follow instruction sheet as given.   Follow-Up: At [facility], you and  your health needs are our priority.  As part of our continuing mission to provide you with exceptional heart care, we have created designated Provider Care Teams.  These Care Teams include your primary Cardiologist (physician) and Advanced Practice Providers (APPs -  Physician Assistants and Nurse Practitioners) who all work together to provide you with the care you need, when you need it.  We recommend signing up for the patient portal called "MyChart".  Sign up information is provided on this After Visit Summary.  MyChart is used to connect with patients for Virtual Visits (Telemedicine).  Patients are able to view lab/test results, encounter notes, upcoming appointments, etc.  Non-urgent messages can be sent to your provider as well.   To learn more about what you can do with MyChart, go to [email or web address].    Your next appointment:   3 month(s)  The format for your next appointment:   In Person  Provider:   Eleonore Chiquito, MD   Other Instructions Your cardiac CT will be scheduled at one of the below locations:   [facility] [address] [place], [place] [address] [phone]  If scheduled at [facility], please arrive at the [facility] main entrance of [facility] 30 minutes prior to test start time. Proceed to the [facility] Radiology Department (first floor) to check-in and test prep.  Please follow these instructions carefully (unless otherwise directed):  Hold all erectile dysfunction medications at least 3 days (72 hrs) prior to test.  On the Night Before the Test: . Be sure to Drink plenty of water. . Do not consume any caffeinated/decaffeinated beverages or chocolate 12 hours prior to your test. . Do not take any antihistamines 12 hours prior to your test. . If you take Metformin do not take 24 hours prior to test.  On  the Day of the Test: . Drink plenty of water. Do not drink any water within one hour of the test. . Do not eat any food 4 hours prior to the test. . You may take your regular medications prior to the test.  . Take metoprolol (Lopressor) two hours prior to test. . HOLD Furosemide/Hydrochlorothiazide morning of the test. . FEMALES- please wear underwire-free bra if available       After the Test: . Drink plenty of water. . After receiving IV contrast, you may experience a mild flushed feeling. This is normal. . On occasion, you may experience a mild rash up to 24 hours after the test. This is not dangerous. If this occurs, you can take Benadryl 25 mg and increase your fluid intake. . If you experience trouble breathing, this can be serious. If it is severe call 911 IMMEDIATELY. If it is mild, please call our office. . If you take any of these medications: Glipizide/Metformin, Avandament, Glucavance, please do not take 48 hours after completing test unless otherwise instructed.   Once we have confirmed authorization from your insurance company, we will call you to set up a date and time for your test.   For non-scheduling related questions, please contact the cardiac imaging nurse navigator should you have any questions/concerns: Marchia Bond, RN Navigator Cardiac Imaging [facility] [phone] office  For scheduling needs, including cancellations and rescheduling, please call [phone].        Signed, Addison Naegeli. Audie Box, [place]  [address] [place], [place] [address] [phone]  10/16/2019 1:56 PM

## 2019-10-16 ENCOUNTER — Ambulatory Visit: Payer: Medicare PPO | Admitting: Cardiovascular Disease

## 2019-10-16 ENCOUNTER — Encounter: Payer: Self-pay | Admitting: Cardiovascular Disease

## 2019-10-16 ENCOUNTER — Other Ambulatory Visit: Payer: Self-pay

## 2019-10-16 VITALS — BP 142/74 | HR 69 | Ht 73.0 in | Wt 189.0 lb

## 2019-10-16 DIAGNOSIS — I25118 Atherosclerotic heart disease of native coronary artery with other forms of angina pectoris: Secondary | ICD-10-CM | POA: Diagnosis not present

## 2019-10-16 DIAGNOSIS — E785 Hyperlipidemia, unspecified: Secondary | ICD-10-CM

## 2019-10-16 DIAGNOSIS — R079 Chest pain, unspecified: Secondary | ICD-10-CM | POA: Diagnosis not present

## 2019-10-16 DIAGNOSIS — I10 Essential (primary) hypertension: Secondary | ICD-10-CM

## 2019-10-16 MED ORDER — NITROGLYCERIN 0.4 MG SL SUBL: 0.4000 mg | SUBLINGUAL_TABLET | SUBLINGUAL | 3 refills | Status: DC | PRN

## 2019-10-16 MED ORDER — METOPROLOL TARTRATE 100 MG PO TABS: 100.0000 mg | ORAL_TABLET | Freq: Two times a day (BID) | ORAL | 2 refills | Status: DC

## 2019-10-16 NOTE — Patient Instructions (Addendum)
Medication Instructions:  Start Metoprolol Tartrate 100 mg twice daily  Start Nitroglycerin as needed for chest pain  Stop Clonidine  *If you need a refill on your cardiac medications before your next appointment, please call your pharmacy*   Lab Work: BMET today   If you have labs (blood work) drawn today and your tests are completely normal, you will receive your results only by: Marland Kitchen MyChart Message (if you have MyChart) OR . A paper copy in the mail If you have any lab test that is abnormal or we need to change your treatment, we will call you to review the results.   Testing/Procedures: Echocardiogram - Your physician has requested that you have an echocardiogram. Echocardiography is a painless test that uses sound waves to create images of your heart. It provides your doctor with information about the size and shape of your heart and how well your heart's chambers and valves are working. This procedure takes approximately one hour. There are no restrictions for this procedure. This will be performed at our [facility] location - [address], [address].  Your physician has requested that you have cardiac CT. Cardiac computed tomography (CT) is a painless test that uses an x-ray machine to take clear, detailed pictures of your heart. For further information please visit [email or web address] Please follow instruction sheet as given.   Follow-Up: At [facility], you and your health needs are our priority.  As part of our continuing mission to provide you with exceptional heart care, we have created designated Provider Care Teams.  These Care Teams include your primary Cardiologist (physician) and Advanced Practice Providers (APPs -  Physician Assistants and Nurse Practitioners) who all work together to provide you with the care you need, when you need it.  We recommend signing up for the patient portal called "MyChart".  Sign up information is provided on this After Visit Summary.   MyChart is used to connect with patients for Virtual Visits (Telemedicine).  Patients are able to view lab/test results, encounter notes, upcoming appointments, etc.  Non-urgent messages can be sent to your provider as well.   To learn more about what you can do with MyChart, go to [email or web address].    Your next appointment:   3 month(s)  The format for your next appointment:   In Person  Provider:   Lennie Odor, MD   Other Instructions Your cardiac CT will be scheduled at one of the below locations:   [facility] [address] [place], [place] [address] [phone]  If scheduled at [facility], please arrive at the [facility] main entrance of [facility] 30 minutes prior to test start time. Proceed to the [facility] Radiology Department (first floor) to check-in and test prep.  Please follow these instructions carefully (unless otherwise directed):  Hold all erectile dysfunction medications at least 3 days (72 hrs) prior to test.  On the Night Before the Test: . Be sure to Drink plenty of water. . Do not consume any caffeinated/decaffeinated beverages or chocolate 12 hours prior to your test. . Do not take any antihistamines 12 hours prior to your test. . If you take Metformin do not take 24 hours prior to test.  On the Day of the Test: . Drink plenty of water. Do not drink any water within one hour of the test. . Do not eat any food 4 hours prior to the test. . You may take your regular medications prior to the  test.  . Take metoprolol (Lopressor) two hours prior to test. . HOLD Furosemide/Hydrochlorothiazide morning of the test. . FEMALES- please wear underwire-free bra if available       After the Test: . Drink plenty of water. . After receiving IV contrast, you may experience a mild flushed feeling. This is normal. . On occasion, you may experience a mild rash up to 24 hours after the test. This is not dangerous. If this  occurs, you can take Benadryl 25 mg and increase your fluid intake. . If you experience trouble breathing, this can be serious. If it is severe call 911 IMMEDIATELY. If it is mild, please call our office. . If you take any of these medications: Glipizide/Metformin, Avandament, Glucavance, please do not take 48 hours after completing test unless otherwise instructed.   Once we have confirmed authorization from your insurance company, we will call you to set up a date and time for your test.   For non-scheduling related questions, please contact the cardiac imaging nurse navigator should you have any questions/concerns: Marchia Bond, RN Navigator Cardiac Imaging [facility] [phone] office  For scheduling needs, including cancellations and rescheduling, please call [phone].

## 2019-10-17 LAB — BASIC METABOLIC PANEL
BUN/Creatinine Ratio: 21 (ref 10–24)
BUN: 17 mg/dL (ref 8–27)
CO2: 24 mmol/L (ref 20–29)
Calcium: 9.3 mg/dL (ref 8.6–10.2)
Chloride: 103 mmol/L (ref 96–106)
Creatinine, Ser: 0.82 mg/dL (ref 0.76–1.27)
GFR calc Af Amer: 101 mL/min/{1.73_m2} (ref >59)
GFR calc non Af Amer: 88 mL/min/{1.73_m2} (ref >59)
Glucose: 99 mg/dL (ref 65–99)
Potassium: 4.5 mmol/L (ref 3.5–5.2)
Sodium: 143 mmol/L (ref 134–144)

## 2019-10-23 ENCOUNTER — Telehealth: Payer: Self-pay | Admitting: Cardiovascular Disease

## 2019-10-23 ENCOUNTER — Encounter: Payer: Self-pay | Admitting: *Deleted

## 2019-10-23 NOTE — Telephone Encounter (Signed)
Spoke with patient who reports he took norvasc some years ago with symptoms of lethargy, lack of energy. Explained that norvasc is a CCB, different class of med than metoprolol (beta-blocker). He does report similar symptoms now that he is on this medication. Advised that his body will hopefully adjust to med, to try for about 2 weeks, and if symptoms persist, call or MyChart message our office

## 2019-10-23 NOTE — Progress Notes (Signed)

## 2019-10-23 NOTE — Progress Notes (Signed)
Things That May Be Affecting Your Health:  Alcohol  Hearing loss  Pain    Depression  Home Safety Y Sexual Health   Diabetes  Lack of physical activity  Stress  Y Difficulty with daily activities  Loneliness  Tiredness   Drug use  Medicines  Tobacco use   Falls  Motor Vehicle Safety  Weight   Food choices  Oral Health  Other    YOUR PERSONALIZED HEALTH PLAN : 1. Schedule your next subsequent Medicare Wellness visit in one year 2. Attend all of your regular appointments to address your medical issues 3. Complete the preventative screenings and services   Annual Wellness Visit   Medicare Covered Preventative Screenings and Services  Services & Screenings Men and Women Who How Often Need? Date of Last Service Action  Abdominal Aortic Aneurysm Adults with AAA risk factors Once     Alcohol Misuse and Counseling All Adults Screening once a year if no alcohol misuse. Counseling up to 4 face to face sessions. Sure    Bone Density Measurement  Adults at risk for osteoporosis Once every 2 yrs     Lipid Panel Z13.6 All adults without CV disease Once every 5 yrs     Colorectal Cancer   Stool sample or  Colonoscopy All adults 50 and older   Once every year  Every 10 years     Depression All Adults Once a year  Today   Diabetes Screening Blood glucose, post glucose load, or GTT Z13.1  All adults at risk  Pre-diabetics  Once per year  Twice per year     Diabetes  Self-Management Training All adults Diabetics 10 hrs first year; 2 hours subsequent years. Requires Copay     Glaucoma  Diabetics  Family history of glaucoma  African Americans 50 yrs +  Hispanic Americans 65 yrs + Annually - requires coppay     Hepatitis C Z72.89 or F19.20  High Risk for HCV  Born between 1945 and 1965  Annually  Once Y    HIV Z11.4 All adults based on risk  Annually btw ages 36 & 28 regardless of risk  Annually > 65 yrs if at increased risk     Lung Cancer Screening Asymptomatic adults  aged 71-77 with 30 pack yr history and current smoker OR quit within the last 15 yrs Annually Must have counseling and shared decision making documentation before first screen     Medical Nutrition Therapy Adults with   Diabetes  Renal disease  Kidney transplant within past 3 yrs 3 hours first year; 2 hours subsequent years     Obesity and Counseling All adults Screening once a year Counseling if BMI 30 or higher  Today   Tobacco Use Counseling Adults who use tobacco  Up to 8 visits in one year     Vaccines Z23  Hepatitis B  Influenza   Pneumonia  Adults   Once  Once every flu season  Two different vaccines separated by one year     Next Annual Wellness Visit People with Medicare Every year  Today     Services & Screenings Women Who How Often Need  Date of Last Service Action  Mammogram  Z12.31 Women over 40 One baseline ages 56-39. Annually ager 40 yrs+     Pap tests All women Annually if high risk. Every 2 yrs for normal risk women     Screening for cervical cancer with   Pap (Z01.419 nl or Z01.411abnl) &  HPV Z11.51 Women aged 51 to 43 Once every 5 yrs     Screening pelvic and breast exams All women Annually if high risk. Every 2 yrs for normal risk women     Sexually Transmitted Diseases  Chlamydia  Gonorrhea  Syphilis All at risk adults Annually for non pregnant females at increased risk         [place] Men Who How Ofter Need  Date of Last Service Action  Prostate Cancer - DRE & PSA Men over 50 Annually.  DRE might require a copay.     Sexually Transmitted Diseases  Syphilis All at risk adults Annually for men at increased risk

## 2019-10-23 NOTE — Telephone Encounter (Signed)
New Message     Pt c/o medication issue:  1. Name of Medication: metoprolol tartrate (LOPRESSOR) 100 MG tablet   2. How are you currently taking this medication (dosage and times per day)? 100 mg 2 x daily  last night took half of the dose and is still having symptoms   3. Are you having a reaction (difficulty breathing--STAT)? no  4. What is your medication issue? Pt says this medication is a  Beta Blocker, pt says he took a beta blocker about 20 years ago and did not have a good experience.  The medication makes him lethargic and tired and does not give him any energy

## 2019-10-28 ENCOUNTER — Other Ambulatory Visit (INDEPENDENT_AMBULATORY_CARE_PROVIDER_SITE_OTHER): Payer: Medicare PPO

## 2019-10-28 DIAGNOSIS — Z91038 Other insect allergy status: Secondary | ICD-10-CM | POA: Diagnosis not present

## 2019-10-28 DIAGNOSIS — Z791 Long term (current) use of non-steroidal anti-inflammatories (NSAID): Secondary | ICD-10-CM | POA: Diagnosis not present

## 2019-10-28 DIAGNOSIS — K219 Gastro-esophageal reflux disease without esophagitis: Secondary | ICD-10-CM | POA: Diagnosis not present

## 2019-10-28 DIAGNOSIS — Z880 Allergy status to penicillin: Secondary | ICD-10-CM | POA: Diagnosis not present

## 2019-10-28 DIAGNOSIS — E785 Hyperlipidemia, unspecified: Secondary | ICD-10-CM | POA: Diagnosis not present

## 2019-10-28 DIAGNOSIS — Z7982 Long term (current) use of aspirin: Secondary | ICD-10-CM | POA: Diagnosis not present

## 2019-10-28 DIAGNOSIS — I10 Essential (primary) hypertension: Secondary | ICD-10-CM

## 2019-10-28 DIAGNOSIS — G20 Parkinson's disease: Secondary | ICD-10-CM | POA: Diagnosis not present

## 2019-10-28 DIAGNOSIS — K5900 Constipation, unspecified: Secondary | ICD-10-CM | POA: Diagnosis not present

## 2019-10-28 DIAGNOSIS — N529 Male erectile dysfunction, unspecified: Secondary | ICD-10-CM | POA: Diagnosis not present

## 2019-10-28 DIAGNOSIS — Z803 Family history of malignant neoplasm of breast: Secondary | ICD-10-CM | POA: Diagnosis not present

## 2019-10-31 ENCOUNTER — Other Ambulatory Visit: Payer: Self-pay

## 2019-10-31 ENCOUNTER — Ambulatory Visit (HOSPITAL_COMMUNITY): Payer: Medicare PPO | Attending: Cardiology

## 2019-10-31 DIAGNOSIS — R079 Chest pain, unspecified: Secondary | ICD-10-CM

## 2019-10-31 DIAGNOSIS — I25118 Atherosclerotic heart disease of native coronary artery with other forms of angina pectoris: Secondary | ICD-10-CM | POA: Diagnosis not present

## 2019-10-31 MED ORDER — PERFLUTREN LIPID MICROSPHERE
1.0000 mL | INTRAVENOUS | Status: AC | PRN
  Administered 2019-10-31: 2 mL via INTRAVENOUS

## 2019-11-04 ENCOUNTER — Telehealth: Payer: Self-pay | Admitting: Cardiovascular Disease

## 2019-11-04 NOTE — Telephone Encounter (Signed)
New Message      Pt c/o BP issue: STAT if pt c/o blurred vision, one-sided weakness or slurred speech  1. What are your last 5 BP readings? 225/101  2. Are you having any other symptoms (ex. Dizziness, headache, blurred vision, passed out)? Headache   3. What is your BP issue? Pt is calling about his bp. He says he has no energy.    Please advise

## 2019-11-04 NOTE — Telephone Encounter (Signed)
Spoke with pt who report a BP of 225/101. He state he has a slight headache and feel very lethargy. He report he can barely put one foot in front of the other to walk. Nurse advised pt to report to ER for further evaluations based off symptoms. Pt verbalized understanding.

## 2019-11-07 DIAGNOSIS — R519 Headache, unspecified: Secondary | ICD-10-CM | POA: Diagnosis not present

## 2019-11-07 DIAGNOSIS — H539 Unspecified visual disturbance: Secondary | ICD-10-CM | POA: Diagnosis not present

## 2019-11-07 DIAGNOSIS — I440 Atrioventricular block, first degree: Secondary | ICD-10-CM | POA: Diagnosis not present

## 2019-11-07 DIAGNOSIS — I10 Essential (primary) hypertension: Secondary | ICD-10-CM | POA: Diagnosis not present

## 2019-11-08 DIAGNOSIS — I10 Essential (primary) hypertension: Secondary | ICD-10-CM | POA: Diagnosis not present

## 2019-11-10 ENCOUNTER — Encounter: Payer: Self-pay | Admitting: *Deleted

## 2019-11-10 NOTE — Progress Notes (Signed)

## 2019-11-11 DIAGNOSIS — M170 Bilateral primary osteoarthritis of knee: Secondary | ICD-10-CM | POA: Diagnosis not present

## 2019-11-11 DIAGNOSIS — M545 Low back pain: Secondary | ICD-10-CM | POA: Diagnosis not present

## 2019-11-12 ENCOUNTER — Encounter: Payer: Medicare PPO | Admitting: Internal Medicine

## 2019-11-13 ENCOUNTER — Other Ambulatory Visit: Payer: Self-pay

## 2019-11-13 ENCOUNTER — Ambulatory Visit: Payer: Medicare PPO | Admitting: Internal Medicine

## 2019-11-13 ENCOUNTER — Telehealth: Payer: Self-pay | Admitting: *Deleted

## 2019-11-13 DIAGNOSIS — I10 Essential (primary) hypertension: Secondary | ICD-10-CM | POA: Diagnosis not present

## 2019-11-13 MED ORDER — CLONIDINE 0.1 MG/24HR TD PTWK: 0.1000 mg | MEDICATED_PATCH | TRANSDERMAL | 11 refills | Status: DC

## 2019-11-13 NOTE — Patient Instructions (Signed)
To Mr. Ketchum,  It was a pleasure meeting your today! Today we discussed your hypertension. We will start you on a clonidine patch today. It only needs to be applied one time per week. Please address your increased blood pressures with your cardiologist next week. Please follow up with Dr. Crista Elliot as well. Have a good day!  [place] ([place]),  Dolan Amen, MD

## 2019-11-13 NOTE — Telephone Encounter (Signed)
I agree. We will evaluate patient when he comes in. Thank you

## 2019-11-13 NOTE — Progress Notes (Signed)
   CC: Hypertension  HPI:  Mr.Jamy Dilley is a 74 y.o. male, with a PMH noted below, who presents to the [facility] for evaluation of his hypertension.   Past Medical History:  Diagnosis Date  . Carotid artery stenosis 04/29/2018  . Chronic tension headaches 04/29/2018  . Essential hypertension 04/29/2018  . GERD (gastroesophageal reflux disease) 04/29/2018  . HLD (hyperlipidemia) 04/29/2018  . Parkinson's disease (HCC) 04/29/2018   Review of Systems:  Review of Systems  Constitutional: Negative for chills, fever, malaise/fatigue and weight loss.  Eyes: Negative for blurred vision and double vision.  Respiratory: Negative for cough, sputum production, shortness of breath and wheezing.   Cardiovascular: Negative for chest pain and palpitations.  Gastrointestinal: Negative for abdominal pain, diarrhea, nausea and vomiting.  Neurological: Positive for headaches. Negative for dizziness.       Occasional headache      Physical Exam:  Vitals:   11/13/19 1008 11/13/19 1018  BP: (!) 175/83 (!) 195/91  Pulse: 76 74  Temp: (!) 97.5 F (36.4 C)   TempSrc: Oral   SpO2: (!) 77%   Weight: 189 lb 1.6 oz (85.8 kg)   Height: 6\' 1"  (1.854 m)    Physical Exam Vitals and nursing note reviewed.  Constitutional:      General: He is not in acute distress.    Appearance: Normal appearance. He is not ill-appearing or toxic-appearing.  HENT:     Head: Normocephalic and atraumatic.  Eyes:     General:        Right eye: No discharge.        Left eye: No discharge.     Conjunctiva/sclera: Conjunctivae normal.  Cardiovascular:     Rate and Rhythm: Normal rate and regular rhythm.     Pulses: Normal pulses.     Heart sounds: Normal heart sounds. No murmur. No friction rub. No gallop.   Pulmonary:     Effort: Pulmonary effort is normal.     Breath sounds: Normal breath sounds. No wheezing, rhonchi or rales.  Abdominal:     General: Bowel sounds are normal.     Palpations: Abdomen is soft.   Tenderness: There is no abdominal tenderness. There is no guarding.  Musculoskeletal:        General: No swelling.     Right lower leg: No edema.     Left lower leg: No edema.     Comments: Pill rolling tremor present in the L hand.   Neurological:     General: No focal deficit present.     Mental Status: He is alert and oriented to person, place, and time.  Psychiatric:        Mood and Affect: Mood normal.        Behavior: Behavior normal.     Assessment & Plan:   See Encounters Tab for problem based charting.  Patient discussed with Dr. 

## 2019-11-13 NOTE — Telephone Encounter (Signed)
Pt's sig other calls and states pt's BP has been consistently elevated for appr 1 1/2 weeks, 200's/100's. She is somewhat in panic mode, stating this is "stroke level BP". appt given for 0945 ACC. Pt is heard speaking in the background.

## 2019-11-14 ENCOUNTER — Encounter: Payer: Self-pay | Admitting: Internal Medicine

## 2019-11-14 NOTE — Assessment & Plan Note (Addendum)
[name] presents to the clinic today due to worsening blood pressure. Patient states that he had seen Dr. Crista Elliot for his elevated pressures and was prescribed clonidine 0.1 mg BID, and to continue his Losartan 100 mg.    He was seen by Cardiology on 10/16/2019, and his clonidine was discontinued in favor for metoprolol 100 mg BID. For the last several weeks, patient has had increased systolic pressures in the 190s to 200s. He was seen at [facility] on 11/07/2019 and was observed for pressures in the 200s with blurry vision and headaches. His symptoms resolved and he made a follow up with his PCP and cardiologist.   Patient presents today with elevated BP of 195/91 and an O2 saturation of 97%. He denies blurry vision, chest pain, nausea/vomitting. He endorses occasional headaches. He is taking his medications as prescribed. He is currently on Losartan 100mg  QD and Metoprolol 100mg  BID.   Assessment:   Patient presenting to the [facility] with elevated pressures, and on max dosing of losartan and BB. There are numerous contraindications to medications such as fluctuations in pressures on BB and diuretics. Hydralazine and calcium channel blockers are also contraindicated with his Parkinson's medications. Given the complexity of the case with patient's comorbidites and multiple contraindications between typical anti hypertensive medications, we will add back clonidine patch today. Patient has follow up with Cardiology and PCP on 11/18/19 and 11/19/19 respectively.   Plan:  - Clonidine Patch 0.1 mg/24 hour started - Continue Losartan 100 mg QD - Continue Metoprolol 100 mg BID

## 2019-11-14 NOTE — Telephone Encounter (Signed)
Terry [name], thank you. I agree that he should be seen but these readings are not entirely new. We have been unable to control his BP in the setting of his underlying Parkinsons disease due to symptomatic hypotension most notably with meals and while lying flat.  He is to be at a maximum dose of Losartan one of the safest medications given his comorbid conditions. Additionally, Cardiology recommended that he start Metoprolol 100mg  BID and NTG for chest pain while they await completion of a CT coronary. CCB and less so BB are both relatively contraindicated in patients being treated for Parkinson's based on limited available information. He is aware that he should not be on Amlodipine or other CCBs. I had recommended clonidine which should be avoided during meals to avoid post prandial hypotension. He has had issues with symptomatic hypotension in the past as well. Cardiology had recommended that he stop the clonidine to which we deferred. I will reach out to his cardiologist today.

## 2019-11-17 ENCOUNTER — Encounter: Payer: Self-pay | Admitting: Internal Medicine

## 2019-11-17 ENCOUNTER — Telehealth: Payer: Self-pay | Admitting: *Deleted

## 2019-11-17 NOTE — Progress Notes (Signed)
Cardiology Office Note   Date:  11/19/2019   ID:  Terry [name], DOB [birth year], MRN [id number]  PCP:  Lanelle Bal, MD  Cardiologist:  [name], MD EP: None  Chief Complaint  Patient presents with  . Follow-up    HTN      History of Present Illness: Terry [name] is a 74 y.o. male with PMH of HTN, HLD, and parkinsons disease, who presents for follow-up of his HTN.  He was last evaluated by cardiology at an outpatient visit with Dr. Flora Lipps 10/16/19 to establish care and evaluate chest pain c/w stable typical angina. He was offered a cardiac catheterization vs non-invasive coronary CTA to further evaluate his symptoms. He preferred conservative work-up at this time. He was started on metoprolol tartrate 100mg  BID, in addition to previously prescribed aspirin and statin. A coronary CTA was ordered - scheduled for 11/25/19. Additionally his clonidine was discontinued at that time and he was continued on losartan for blood pressure management. Echo 10/31/19 showed EF 65-70%, G2DD, moderate asymmetric LVH of the basal-septal segment, moderate LAE, and no significant valvular abnormalities. He contacted our office 11/04/19 to report BP elevated to 225/101 with associated headache and lethargy. Advised to go to the ED. Appears he did not get evaluated at that time, though did present to to [facility] ED 11/07/19 at the behest of his girlfriend for elevated blood pressures at home (reportedly 220s/90s). SBP was reportedly in the 180s in the ED. Does not appear any medication changes occurred and he was recommended to follow-up outpatient as soon as possible. Seen by PCP 11/13/19 and restarted on a clonidine patch 0.1mg /24 hours as he was already on the highest does of losartan and addition of CCBs and hydralazine were reported to be contraindicated in patients with parkinsons disease. He sent a MyChart message 11/17/19 with updated blood pressure readings showing improvement in BP, though he reports  he did not start clonidine as he wanted both cardiology and his PCP to be on the same page.   He presents today for follow-up of his HTN. Thankfully his blood pressure has been improve to 130s/60s for the past couple days. On further questioning of his blood pressure regimen in the past, he reports he never took clonidine. He continues to feel lethargic with addition of metoprolol. Also with progressive DOE, though no significant chest pain episodes in recent weeks. He has given the options for ischemic evaluation more thought and would like to pursue a definitive evaluation at this time. He has some dizziness but no syncope. No complaints of LE edema, orthopnea, or PND. He is getting married this summer and wants to be in good shape to travel in the coming years.     Past Medical History:  Diagnosis Date  . Carotid artery stenosis 04/29/2018  . Chronic tension headaches 04/29/2018  . Essential hypertension 04/29/2018  . GERD (gastroesophageal reflux disease) 04/29/2018  . HLD (hyperlipidemia) 04/29/2018  . Parkinson's disease (HCC) 04/29/2018    Past Surgical History:  Procedure Laterality Date  . TONSILLECTOMY       Current Outpatient Medications  Medication Sig Dispense Refill  . aspirin EC 81 MG tablet Take 81 mg by mouth daily.    06/29/2018 atorvastatin (LIPITOR) 20 MG tablet TAKE ONE TABLET BY MOUTH EVERY NIGHT AT BEDTIME 90 tablet 1  . betamethasone acetate-betamethasone sodium phosphate (CELESTONE) 6 (3-3) MG/ML injection 3 1/3 cc Celestone Soluspan 6mg /ml    . carbidopa-levodopa (SINEMET CR) 50-200 MG tablet Take 1  tablet by mouth at bedtime. 90 tablet 1  . carbidopa-levodopa (SINEMET IR) 25-100 MG tablet Take 1 tablet by mouth 5 (five) times daily.    . cetirizine (ZYRTEC) 10 MG tablet Take 10 mg by mouth as needed for allergies.     . fexofenadine (ALLEGRA) 180 MG tablet Take by mouth.    . fluticasone (FLONASE) 50 MCG/ACT nasal spray Place 1 spray into both nostrils as needed.     .  meloxicam (MOBIC) 15 MG tablet Take 15 mg by mouth as needed.    . Multiple Vitamin (THEREMS) TABS Take by mouth.    . naproxen sodium (ALEVE) 220 MG tablet Take 220 mg by mouth 2 (two) times daily as needed.    Marland Kitchen NEUPRO 6 MG/24HR APPLY 1 PATCH ON THE SKIN DAILY  0  . omeprazole (PRILOSEC) 40 MG capsule TAKE ONE CAPSULE BY MOUTH DAILY (Patient taking differently: Take 40 mg by mouth daily. ) 90 capsule 0  . traZODone (DESYREL) 50 MG tablet Take 1 tablet (50 mg total) by mouth at bedtime as needed for sleep. 90 tablet 0  . amLODipine (NORVASC) 10 MG tablet Take 1 tablet (10 mg total) by mouth daily. 90 tablet 2  . carvedilol (COREG) 3.125 MG tablet Take 1 tablet (3.125 mg total) by mouth 2 (two) times daily. 180 tablet 3  . cloNIDine (CATAPRES - DOSED IN MG/24 HR) 0.1 mg/24hr patch Place 1 patch (0.1 mg total) onto the skin every 7 (seven) days. (Patient not taking: Reported on 11/18/2019) 4 patch 11  . nitroGLYCERIN (NITROSTAT) 0.4 MG SL tablet Place 1 tablet (0.4 mg total) under the tongue every 5 (five) minutes as needed for chest pain. 90 tablet 3  . Tadalafil (CIALIS) 2.5 MG TABS Take 1 tablet (2.5 mg total) by mouth daily. (Patient not taking: Reported on 11/18/2019) 30 tablet 1  . valsartan (DIOVAN) 160 MG tablet Take 1 tablet (160 mg total) by mouth daily. 90 tablet 3   No current facility-administered medications for this visit.    Allergies:   Penicillins, Erythromycin, and Azithromycin    Social History:  The patient  reports that he has never smoked. He has never used smokeless tobacco. He reports current alcohol use. He reports that he does not use drugs.   Family History:  The patient's family history includes Cancer in his father; Heart disease in his mother.    ROS:  Please see the history of present illness.   Otherwise, review of systems are positive for none.   All other systems are reviewed and negative.    PHYSICAL EXAM: VS:  BP 138/62   Pulse 76   Temp (!) 97.2 F  (36.2 C) Comment: Forehead  Ht 6\' 1"  (1.854 m)   Wt 182 lb 6.4 oz (82.7 kg)   SpO2 97%   BMI 24.06 kg/m  , BMI Body mass index is 24.06 kg/m. GEN: Well nourished, well developed, in no acute distress HEENT: normal Neck: no JVD, carotid bruits, or masses Cardiac: RRR; +murmur, no rubs or gallops,no edema  Respiratory:  clear to auscultation bilaterally, normal work of breathing GI: soft, nontender, nondistended, + BS MS: no deformity or atrophy Skin: warm and dry, no rash Neuro:  Strength and sensation are intact Psych: euthymic mood, full affect   EKG:  EKG is not ordered today.   Recent Labs: 11/18/2019: BUN 23; Creatinine, Ser 1.01; Hemoglobin 15.9; Platelets 233; Potassium 4.6; Sodium 143    Lipid Panel No results found for:  CHOL, TRIG, HDL, CHOLHDL, VLDL, LDLCALC, LDLDIRECT    Wt Readings from Last 3 Encounters:  11/18/19 182 lb 6.4 oz (82.7 kg)  11/13/19 189 lb 1.6 oz (85.8 kg)  10/16/19 189 lb (85.7 kg)      Other studies Reviewed: Additional studies/ records that were reviewed today include:   Echocardiogram 10/31/19: 1. Left ventricular ejection fraction, by estimation, is 65 to 70%. The  left ventricle has normal function. The left ventricle has no regional  wall motion abnormalities. 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 Left ventricular diastolic parameters are consistent with Grade  II diastolic dysfunction (pseudonormalization). Elevated left atrial  pressure.  2. Right ventricular systolic function is normal. The right ventricular  size is mildly enlarged. Tricuspid regurgitation signal is inadequate for  assessing PA pressure.  3. Left atrial size was moderately dilated.  4. The mitral valve is normal in structure. No evidence of mitral valve  regurgitation.  5. The aortic valve is tricuspid. Aortic valve regurgitation is not  visualized. No aortic stenosis is present.  6. The inferior vena  cava is dilated in size with >50% respiratory  variability, suggesting right atrial pressure of 8 mmHg.     ASSESSMENT AND PLAN:  1. HTN: BP 138/62 today. Discussed with pharmacy who suggested transition from losartan to valsartan for better BP control and consideration of an alternative BBlocker - Favor avoiding clonidine - Stop metoprolol and losartan - Will transition to carvedilol 3.125mg  BID - Will transition to valsartan 160mg  daily  2. Exertional chest pain/DOE: anticipating a coronary CTA 11/25/19 to further evaluate recent chest pain complaints. He has had progressive DOE over the past month which is significantly limiting his lifestyle. At this time he would like a definitive evaluation with a heart catheterization. This seems reasonable given c/w progressive anginal equivalent.  - Will plan for LHC. The patient understands that risks included but are not limited to stroke (1 in 1000), death (1 in 1000), kidney failure [usually temporary] (1 in 500), bleeding (1 in 200), allergic reaction [possibly serious] (1 in 200).  The patient understands and agrees to proceed.   3. HLD: LDL reportedly 38 in 2019 - Continue atorvastatin 20mg  daily   Current medicines are reviewed at length with the patient today.  The patient does not have concerns regarding medicines.  The following changes have been made:  As above  Labs/ tests ordered today include:   Orders Placed This Encounter  Procedures  . Basic metabolic panel  . CBC     Disposition:   FU with myself or Dr. 2020 1-2 weeks after his heart catheterization.   Signed, , PA-C  11/19/2019 1:37 AM

## 2019-11-17 NOTE — H&P (View-Only) (Signed)
  Cardiology Office Note   Date:  11/19/2019   ID:  Terry [name], DOB 07/31/1945, MRN [id number]  PCP:  Harbrecht, Lawrence, MD  Cardiologist:  El Refugio T O'Neal, MD EP: None  Chief Complaint  Patient presents with  . Follow-up    HTN      History of Present Illness: Terry [name] is a 73 y.o. male with PMH of HTN, HLD, and parkinsons disease, who presents for follow-up of his HTN.  He was last evaluated by cardiology at an outpatient visit with Dr. O'Neal 10/16/19 to establish care and evaluate chest pain c/w stable typical angina. He was offered a cardiac catheterization vs non-invasive coronary CTA to further evaluate his symptoms. He preferred conservative work-up at this time. He was started on metoprolol tartrate 100mg BID, in addition to previously prescribed aspirin and statin. A coronary CTA was ordered - scheduled for 11/25/19. Additionally his clonidine was discontinued at that time and he was continued on losartan for blood pressure management. Echo 10/31/19 showed EF 65-70%, G2DD, moderate asymmetric LVH of the basal-septal segment, moderate LAE, and no significant valvular abnormalities. He contacted our office 11/04/19 to report BP elevated to 225/101 with associated headache and lethargy. Advised to go to the ED. Appears he did not get evaluated at that time, though did present to to [facility] ED 11/07/19 at the behest of his girlfriend for elevated blood pressures at home (reportedly 220s/90s). SBP was reportedly in the 180s in the ED. Does not appear any medication changes occurred and he was recommended to follow-up outpatient as soon as possible. Seen by PCP 11/13/19 and restarted on a clonidine patch 0.1mg/24 hours as he was already on the highest does of losartan and addition of CCBs and hydralazine were reported to be contraindicated in patients with parkinsons disease. He sent a MyChart message 11/17/19 with updated blood pressure readings showing improvement in BP, though he reports  he did not start clonidine as he wanted both cardiology and his PCP to be on the same page.   He presents today for follow-up of his HTN. Thankfully his blood pressure has been improve to 130s/60s for the past couple days. On further questioning of his blood pressure regimen in the past, he reports he never took clonidine. He continues to feel lethargic with addition of metoprolol. Also with progressive DOE, though no significant chest pain episodes in recent weeks. He has given the options for ischemic evaluation more thought and would like to pursue a definitive evaluation at this time. He has some dizziness but no syncope. No complaints of LE edema, orthopnea, or PND. He is getting married this summer and wants to be in good shape to travel in the coming years.     Past Medical History:  Diagnosis Date  . Carotid artery stenosis 04/29/2018  . Chronic tension headaches 04/29/2018  . Essential hypertension 04/29/2018  . GERD (gastroesophageal reflux disease) 04/29/2018  . HLD (hyperlipidemia) 04/29/2018  . Parkinson's disease (HCC) 04/29/2018    Past Surgical History:  Procedure Laterality Date  . TONSILLECTOMY       Current Outpatient Medications  Medication Sig Dispense Refill  . aspirin EC 81 MG tablet Take 81 mg by mouth daily.    . atorvastatin (LIPITOR) 20 MG tablet TAKE ONE TABLET BY MOUTH EVERY NIGHT AT BEDTIME 90 tablet 1  . betamethasone acetate-betamethasone sodium phosphate (CELESTONE) 6 (3-3) MG/ML injection 3 1/3 cc Celestone Soluspan 6mg/ml    . carbidopa-levodopa (SINEMET CR) 50-200 MG tablet Take 1   tablet by mouth at bedtime. 90 tablet 1  . carbidopa-levodopa (SINEMET IR) 25-100 MG tablet Take 1 tablet by mouth 5 (five) times daily.    . cetirizine (ZYRTEC) 10 MG tablet Take 10 mg by mouth as needed for allergies.     . fexofenadine (ALLEGRA) 180 MG tablet Take by mouth.    . fluticasone (FLONASE) 50 MCG/ACT nasal spray Place 1 spray into both nostrils as needed.     .  meloxicam (MOBIC) 15 MG tablet Take 15 mg by mouth as needed.    . Multiple Vitamin (THEREMS) TABS Take by mouth.    . naproxen sodium (ALEVE) 220 MG tablet Take 220 mg by mouth 2 (two) times daily as needed.    . NEUPRO 6 MG/24HR APPLY 1 PATCH ON THE SKIN DAILY  0  . omeprazole (PRILOSEC) 40 MG capsule TAKE ONE CAPSULE BY MOUTH DAILY (Patient taking differently: Take 40 mg by mouth daily. ) 90 capsule 0  . traZODone (DESYREL) 50 MG tablet Take 1 tablet (50 mg total) by mouth at bedtime as needed for sleep. 90 tablet 0  . amLODipine (NORVASC) 10 MG tablet Take 1 tablet (10 mg total) by mouth daily. 90 tablet 2  . carvedilol (COREG) 3.125 MG tablet Take 1 tablet (3.125 mg total) by mouth 2 (two) times daily. 180 tablet 3  . cloNIDine (CATAPRES - DOSED IN MG/24 HR) 0.1 mg/24hr patch Place 1 patch (0.1 mg total) onto the skin every 7 (seven) days. (Patient not taking: Reported on 11/18/2019) 4 patch 11  . nitroGLYCERIN (NITROSTAT) 0.4 MG SL tablet Place 1 tablet (0.4 mg total) under the tongue every 5 (five) minutes as needed for chest pain. 90 tablet 3  . Tadalafil (CIALIS) 2.5 MG TABS Take 1 tablet (2.5 mg total) by mouth daily. (Patient not taking: Reported on 11/18/2019) 30 tablet 1  . valsartan (DIOVAN) 160 MG tablet Take 1 tablet (160 mg total) by mouth daily. 90 tablet 3   No current facility-administered medications for this visit.    Allergies:   Penicillins, Erythromycin, and Azithromycin    Social History:  The patient  reports that he has never smoked. He has never used smokeless tobacco. He reports current alcohol use. He reports that he does not use drugs.   Family History:  The patient's family history includes Cancer in his father; Heart disease in his mother.    ROS:  Please see the history of present illness.   Otherwise, review of systems are positive for none.   All other systems are reviewed and negative.    PHYSICAL EXAM: VS:  BP 138/62   Pulse 76   Temp (!) 97.2 F  (36.2 C) Comment: Forehead  Ht 6' 1" (1.854 m)   Wt 182 lb 6.4 oz (82.7 kg)   SpO2 97%   BMI 24.06 kg/m  , BMI Body mass index is 24.06 kg/m. GEN: Well nourished, well developed, in no acute distress HEENT: normal Neck: no JVD, carotid bruits, or masses Cardiac: RRR; +murmur, no rubs or gallops,no edema  Respiratory:  clear to auscultation bilaterally, normal work of breathing GI: soft, nontender, nondistended, + BS MS: no deformity or atrophy Skin: warm and dry, no rash Neuro:  Strength and sensation are intact Psych: euthymic mood, full affect   EKG:  EKG is not ordered today.   Recent Labs: 11/18/2019: BUN 23; Creatinine, Ser 1.01; Hemoglobin 15.9; Platelets 233; Potassium 4.6; Sodium 143    Lipid Panel No results found for:   CHOL, TRIG, HDL, CHOLHDL, VLDL, LDLCALC, LDLDIRECT    Wt Readings from Last 3 Encounters:  11/18/19 182 lb 6.4 oz (82.7 kg)  11/13/19 189 lb 1.6 oz (85.8 kg)  10/16/19 189 lb (85.7 kg)      Other studies Reviewed: Additional studies/ records that were reviewed today include:   Echocardiogram 10/31/19: 1. Left ventricular ejection fraction, by estimation, is 65 to 70%. The  left ventricle has normal function. The left ventricle has no regional  wall motion abnormalities. 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 Left ventricular diastolic parameters are consistent with Grade  II diastolic dysfunction (pseudonormalization). Elevated left atrial  pressure.  2. Right ventricular systolic function is normal. The right ventricular  size is mildly enlarged. Tricuspid regurgitation signal is inadequate for  assessing PA pressure.  3. Left atrial size was moderately dilated.  4. The mitral valve is normal in structure. No evidence of mitral valve  regurgitation.  5. The aortic valve is tricuspid. Aortic valve regurgitation is not  visualized. No aortic stenosis is present.  6. The inferior vena  cava is dilated in size with >50% respiratory  variability, suggesting right atrial pressure of 8 mmHg.     ASSESSMENT AND PLAN:  1. HTN: BP 138/62 today. Discussed with pharmacy who suggested transition from losartan to valsartan for better BP control and consideration of an alternative BBlocker - Favor avoiding clonidine - Stop metoprolol and losartan - Will transition to carvedilol 3.125mg BID - Will transition to valsartan 160mg daily  2. Exertional chest pain/DOE: anticipating a coronary CTA 11/25/19 to further evaluate recent chest pain complaints. He has had progressive DOE over the past month which is significantly limiting his lifestyle. At this time he would like a definitive evaluation with a heart catheterization. This seems reasonable given c/w progressive anginal equivalent.  - Will plan for LHC. The patient understands that risks included but are not limited to stroke (1 in 1000), death (1 in 1000), kidney failure [usually temporary] (1 in 500), bleeding (1 in 200), allergic reaction [possibly serious] (1 in 200).  The patient understands and agrees to proceed.   3. HLD: LDL reportedly 38 in 2019 - Continue atorvastatin 20mg daily   Current medicines are reviewed at length with the patient today.  The patient does not have concerns regarding medicines.  The following changes have been made:  As above  Labs/ tests ordered today include:   Orders Placed This Encounter  Procedures  . Basic metabolic panel  . CBC     Disposition:   FU with myself or Dr. O'Neal 1-2 weeks after his heart catheterization.   Signed, Dene Nazir M. Decorian Schuenemann Som, PA-C  11/19/2019 1:37 AM    

## 2019-11-17 NOTE — Progress Notes (Signed)
Things That May Be Affecting Your Health:  Alcohol  Hearing loss  Pain    Depression  Home Safety  Sexual Health   Diabetes Y Lack of physical activity  Stress  Y Difficulty with daily activities  Loneliness Y Tiredness   Drug use  Medicines  Tobacco use   Falls  Motor Vehicle Safety  Weight   Food choices  Oral Health  Other    YOUR PERSONALIZED HEALTH PLAN : 1. Schedule your next subsequent Medicare Wellness visit in one year 2. Attend all of your regular appointments to address your medical issues 3. Complete the preventative screenings and services   Annual Wellness Visit   Medicare Covered Preventative Screenings and Services  Services & Screenings Men and Women Who How Often Need? Date of Last Service Action  Abdominal Aortic Aneurysm Adults with AAA risk factors Once     Alcohol Misuse and Counseling All Adults Screening once a year if no alcohol misuse. Counseling up to 4 face to face sessions. Y    Bone Density Measurement  Adults at risk for osteoporosis Once every 2 yrs     Lipid Panel Z13.6 All adults without CV disease Once every 5 yrs     Colorectal Cancer   Stool sample or  Colonoscopy All adults 68 and older   Once every year  Every 10 years     Depression All Adults Once a year Y Today   Diabetes Screening Blood glucose, post glucose load, or GTT Z13.1  All adults at risk  Pre-diabetics  Once per year  Twice per year     Diabetes  Self-Management Training All adults Diabetics 10 hrs first year; 2 hours subsequent years. Requires Copay     Glaucoma  Diabetics  Family history of glaucoma  African Americans 50 yrs +  Hispanic Americans 65 yrs + Annually - requires coppay     Hepatitis C Z72.89 or F19.20  High Risk for HCV  Born between 1945 and 1965  Annually  Once     HIV Z11.4 All adults based on risk  Annually btw ages 90 & 81 regardless of risk  Annually > 65 yrs if at increased risk     Lung Cancer Screening Asymptomatic adults  aged 85-77 with 30 pack yr history and current smoker OR quit within the last 15 yrs Annually Must have counseling and shared decision making documentation before first screen     Medical Nutrition Therapy Adults with   Diabetes  Renal disease  Kidney transplant within past 3 yrs 3 hours first year; 2 hours subsequent years     Obesity and Counseling All adults Screening once a year Counseling if BMI 30 or higher  Today   Tobacco Use Counseling Adults who use tobacco  Up to 8 visits in one year     Vaccines Z23  Hepatitis B  Influenza   Pneumonia  Adults   Once  Once every flu season  Two different vaccines separated by one year     Next Annual Wellness Visit People with Medicare Every year  Today     Services & Screenings Women Who How Often Need  Date of Last Service Action  Mammogram  Z12.31 Women over 40 One baseline ages 34-39. Annually ager 40 yrs+     Pap tests All women Annually if high risk. Every 2 yrs for normal risk women     Screening for cervical cancer with   Pap (Z01.419 nl or Z01.411abnl) &  HPV Z11.51 Women aged 51 to 43 Once every 5 yrs     Screening pelvic and breast exams All women Annually if high risk. Every 2 yrs for normal risk women     Sexually Transmitted Diseases  Chlamydia  Gonorrhea  Syphilis All at risk adults Annually for non pregnant females at increased risk         [place] Men Who How Ofter Need  Date of Last Service Action  Prostate Cancer - DRE & PSA Men over 50 Annually.  DRE might require a copay.     Sexually Transmitted Diseases  Syphilis All at risk adults Annually for men at increased risk

## 2019-11-17 NOTE — Telephone Encounter (Signed)
Doris Can you make this happen? And let pt know? Thanks

## 2019-11-17 NOTE — Telephone Encounter (Signed)
Fax from [facility] pharmacy - stated pt is on Metoprolol and Clonidine - do u want both? Stated interactions between both are "additive hypotension, severe bradycardia, sinus node dysfunction, and AV nodal conduction dysfunction.  Please advise Thanks

## 2019-11-18 ENCOUNTER — Other Ambulatory Visit: Payer: Self-pay

## 2019-11-18 ENCOUNTER — Encounter: Payer: Self-pay | Admitting: Medical

## 2019-11-18 ENCOUNTER — Ambulatory Visit: Payer: Medicare PPO | Admitting: Medical

## 2019-11-18 VITALS — BP 138/62 | HR 76 | Temp 97.2°F | Ht 73.0 in | Wt 182.4 lb

## 2019-11-18 DIAGNOSIS — I10 Essential (primary) hypertension: Secondary | ICD-10-CM | POA: Diagnosis not present

## 2019-11-18 DIAGNOSIS — R079 Chest pain, unspecified: Secondary | ICD-10-CM

## 2019-11-18 DIAGNOSIS — E785 Hyperlipidemia, unspecified: Secondary | ICD-10-CM | POA: Diagnosis not present

## 2019-11-18 DIAGNOSIS — Z01810 Encounter for preprocedural cardiovascular examination: Secondary | ICD-10-CM | POA: Diagnosis not present

## 2019-11-18 MED ORDER — VALSARTAN 160 MG PO TABS
160.0000 mg | ORAL_TABLET | Freq: Every day | ORAL | 3 refills | Status: DC
Start: 2019-11-18 — End: 2021-04-28

## 2019-11-18 MED ORDER — CARVEDILOL 3.125 MG PO TABS
3.1250 mg | ORAL_TABLET | Freq: Two times a day (BID) | ORAL | 3 refills | Status: DC
Start: 2019-11-18 — End: 2019-12-05

## 2019-11-18 MED ORDER — AMLODIPINE BESYLATE 10 MG PO TABS
10.0000 mg | ORAL_TABLET | Freq: Every day | ORAL | 2 refills | Status: DC
Start: 2019-11-18 — End: 2021-09-02

## 2019-11-18 NOTE — Patient Instructions (Addendum)
Medication Instructions:   STOP METOPROLOL TARTRATE  STOP LOSARTAN  START CARVEDILOL (COREG) 3.125 MG 2 TIMES A DAY  START VALSARTAN 160 MG DAILY  *If you need a refill on your cardiac medications before your next appointment, please call your pharmacy*  Lab Work: Your physician recommends that you return for lab work TODAY:   BMET  CBC  You will need to have a COVID-19 test. This is done at [address] [place], [place] [address]  If you have labs (blood work) drawn today and your tests are completely normal, you will receive your results only by: Marland Kitchen MyChart Message (if you have MyChart) OR . A paper copy in the mail If you have any lab test that is abnormal or we need to change your treatment, we will call you to review the results.  Testing/Procedures: Your physician has requested that you have a cardiac catheterization. Cardiac catheterization is used to diagnose and/or treat various heart conditions. Doctors may recommend this procedure for a number of different reasons. The most common reason is to evaluate chest pain. Chest pain can be a symptom of coronary artery disease (CAD), and cardiac catheterization can show whether plaque is narrowing or blocking your heart's arteries. This procedure is also used to evaluate the valves, as well as measure the blood flow and oxygen levels in different parts of your heart. For further information please visit [email or web address] Please follow instruction sheet, as given.  Follow-Up: At [facility], you and your health needs are our priority.  As part of our continuing mission to provide you with exceptional heart care, we have created designated Provider Care Teams.  These Care Teams include your primary Cardiologist (physician) and Advanced Practice Providers (APPs -  Physician Assistants and Nurse Practitioners) who all work together to provide you with the care you need, when you need it.   Your next appointment:   1-2 week(s)  after Heart Catheterization  The format for your next appointment:   In Person  Provider:   Lennie Odor, MD or Judy Pimple, PA-C  Other Instructions        [facility] CARDIOVASCULAR DIVISION [facility] [address] [address] [place] [address] Dept: [phone] Loc: [phone]  Terry [name]  11/18/2019  You are scheduled for a Cardiac Catheterization on Tuesday, May 4 with Dr. Verdis Prime.  1. Please arrive at the [facility] (Main Entrance A) at [facility]: [address] [place], [place] [address] at 5:30 AM (This time is two hours before your procedure to ensure your preparation). Free valet parking service is available.   Special note: Every effort is made to have your procedure done on time. Please understand that emergencies sometimes delay scheduled procedures.  2. Diet: Do not eat solid foods after midnight.  The patient may have clear liquids until 5am upon the day of the procedure.  3. Labs: You will need to have blood drawn on Tuesday, April 27 at [facility] [address], [place]  Open: 8am - 5pm (Lunch 12:30 - 1:30)   Phone: [phone]. You do not need to be fasting.  4. Medication instructions in preparation for your procedure:   Contrast Allergy: No  On the morning of your procedure, take your Aspirin and any morning medicines NOT listed above.  You may use sips of water.  5. Plan for one night stay--bring personal belongings. 6. Bring a current list of your medications and current insurance cards. 7.  You MUST have a responsible person to drive you home. 8. Someone MUST be with you the first 24 hours after you arrive home or your discharge will be delayed. 9. Please wear clothes that are easy to get on and off and wear slip-on shoes.  Thank you for allowing [place] to care for you!   -- [facility]

## 2019-11-18 NOTE — Progress Notes (Signed)
mlo

## 2019-11-19 ENCOUNTER — Encounter: Payer: Medicare PPO | Admitting: Internal Medicine

## 2019-11-19 LAB — BASIC METABOLIC PANEL
BUN/Creatinine Ratio: 23 (ref 10–24)
BUN: 23 mg/dL (ref 8–27)
CO2: 26 mmol/L (ref 20–29)
Calcium: 8.7 mg/dL (ref 8.6–10.2)
Chloride: 104 mmol/L (ref 96–106)
Creatinine, Ser: 1.01 mg/dL (ref 0.76–1.27)
GFR calc Af Amer: 85 mL/min/{1.73_m2} (ref >59)
GFR calc non Af Amer: 73 mL/min/{1.73_m2} (ref >59)
Glucose: 82 mg/dL (ref 65–99)
Potassium: 4.6 mmol/L (ref 3.5–5.2)
Sodium: 143 mmol/L (ref 134–144)

## 2019-11-19 LAB — CBC
Hematocrit: 47.8 % (ref 37.5–51.0)
Hemoglobin: 15.9 g/dL (ref 13.0–17.7)
MCH: 29.2 pg (ref 26.6–33.0)
MCHC: 33.3 g/dL (ref 31.5–35.7)
MCV: 88 fL (ref 79–97)
Platelets: 233 10*3/uL (ref 150–450)
RBC: 5.44 x10E6/uL (ref 4.14–5.80)
RDW: 13.1 % (ref 11.6–15.4)
WBC: 15.4 10*3/uL — ABNORMAL HIGH (ref 3.4–10.8)

## 2019-11-19 NOTE — Progress Notes (Signed)
Internal Medicine Clinic Attending  Case discussed with Dr. Winters at the time of the visit.  We reviewed the resident's history and exam and pertinent patient test results.  I agree with the assessment, diagnosis, and plan of care documented in the resident's note.  

## 2019-11-20 ENCOUNTER — Other Ambulatory Visit (HOSPITAL_COMMUNITY)
Admission: RE | Admit: 2019-11-20 | Discharge: 2019-11-20 | Disposition: A | Discharge status: Home / Self Care | Payer: Medicare PPO | Source: Ambulatory Visit | Attending: Interventional Cardiology | Admitting: Interventional Cardiology

## 2019-11-20 DIAGNOSIS — Z01812 Encounter for preprocedural laboratory examination: Secondary | ICD-10-CM | POA: Diagnosis not present

## 2019-11-20 DIAGNOSIS — Z20822 Contact with and (suspected) exposure to covid-19: Secondary | ICD-10-CM | POA: Insufficient documentation

## 2019-11-20 LAB — SARS CORONAVIRUS 2 (TAT 6-24 HRS): SARS Coronavirus 2: NEGATIVE

## 2019-11-20 NOTE — Telephone Encounter (Signed)
We should follow cardiologies recommendations at this time. It appears that Terry [name] may be seeing his former PCP in [place] per Epic.

## 2019-11-21 NOTE — Telephone Encounter (Signed)
Fax sent back to pharmacy - to send to pt's cardiologist. Per Epic, metoprolol was discontinued 11/18/19.

## 2019-11-21 NOTE — Addendum Note (Signed)
Addended by: Dorris Fetch on: 11/21/2019 04:19 PM   Modules accepted: Orders

## 2019-11-24 ENCOUNTER — Telehealth: Payer: Self-pay | Admitting: *Deleted

## 2019-11-24 NOTE — Telephone Encounter (Signed)
Pt contacted pre-catheterization scheduled at [facility] for: Tuesday Nov 25, 2019 7:30 AM Verified arrival time and place: [facility] Main Entrance A [facility]) at: 5:30 AM   No solid food after midnight prior to cath, clear liquids until 5 AM day of procedure. Contrast allergy: no  Hold: Pt states he is not currently taking Cialis.  AM meds can be  taken pre-cath with sip of water including: ASA 81 mg   Confirmed patient has responsible adult to drive home post procedure and observe 24 hours after arriving home: yes  You are allowed ONE visitor in the waiting room during your procedures. Both you and your visitor must wear masks.      COVID-19 Pre-Screening Questions:  . In the past 7 to 10 days have you had a cough,  shortness of breath, headache, congestion, fever (100 or greater) body aches, chills, sore throat, or sudden loss of taste or sense of smell? No . Have you been around anyone with known Covid 19 in the past 7 to 10 days? no . Have you been around anyone who is awaiting Covid 19 test results in the past 7 to 10 days? no . Have you been around anyone who has mentioned symptoms of Covid 19 within the past 7 to 10 days? No  Reviewed procedure/mask/visitor instructions, COVID-19 screening questions with patient.  If you have any concerns/questions about symptoms patients report during screening (either on the phone or at threshold). Contact the provider seeing the patient or DOD for further guidance.  If neither are available contact a member of the leadership team.

## 2019-11-25 ENCOUNTER — Encounter (HOSPITAL_COMMUNITY)
Admission: RE | Disposition: A | Discharge status: Home / Self Care | Payer: Self-pay | Source: Home / Self Care | Attending: Interventional Cardiology

## 2019-11-25 ENCOUNTER — Ambulatory Visit (HOSPITAL_COMMUNITY)
Admission: RE | Admit: 2019-11-25 | Discharge: 2019-11-25 | Disposition: A | Discharge status: Home / Self Care | Payer: Medicare PPO | Attending: Interventional Cardiology | Admitting: Interventional Cardiology

## 2019-11-25 ENCOUNTER — Other Ambulatory Visit: Payer: Self-pay

## 2019-11-25 ENCOUNTER — Ambulatory Visit (HOSPITAL_COMMUNITY): Payer: Medicare PPO

## 2019-11-25 DIAGNOSIS — Z79899 Other long term (current) drug therapy: Secondary | ICD-10-CM | POA: Insufficient documentation

## 2019-11-25 DIAGNOSIS — G20 Parkinson's disease: Secondary | ICD-10-CM | POA: Diagnosis not present

## 2019-11-25 DIAGNOSIS — Z880 Allergy status to penicillin: Secondary | ICD-10-CM | POA: Insufficient documentation

## 2019-11-25 DIAGNOSIS — I6529 Occlusion and stenosis of unspecified carotid artery: Secondary | ICD-10-CM | POA: Diagnosis present

## 2019-11-25 DIAGNOSIS — R0789 Other chest pain: Secondary | ICD-10-CM | POA: Diagnosis present

## 2019-11-25 DIAGNOSIS — R0600 Dyspnea, unspecified: Secondary | ICD-10-CM

## 2019-11-25 DIAGNOSIS — K219 Gastro-esophageal reflux disease without esophagitis: Secondary | ICD-10-CM | POA: Insufficient documentation

## 2019-11-25 DIAGNOSIS — I25118 Atherosclerotic heart disease of native coronary artery with other forms of angina pectoris: Secondary | ICD-10-CM | POA: Insufficient documentation

## 2019-11-25 DIAGNOSIS — Z791 Long term (current) use of non-steroidal anti-inflammatories (NSAID): Secondary | ICD-10-CM | POA: Diagnosis not present

## 2019-11-25 DIAGNOSIS — E785 Hyperlipidemia, unspecified: Secondary | ICD-10-CM | POA: Diagnosis not present

## 2019-11-25 DIAGNOSIS — Z881 Allergy status to other antibiotic agents status: Secondary | ICD-10-CM | POA: Diagnosis not present

## 2019-11-25 DIAGNOSIS — G44229 Chronic tension-type headache, not intractable: Secondary | ICD-10-CM | POA: Diagnosis present

## 2019-11-25 DIAGNOSIS — I10 Essential (primary) hypertension: Secondary | ICD-10-CM | POA: Diagnosis not present

## 2019-11-25 DIAGNOSIS — Z7982 Long term (current) use of aspirin: Secondary | ICD-10-CM | POA: Diagnosis not present

## 2019-11-25 HISTORY — PX: LEFT HEART CATH AND CORONARY ANGIOGRAPHY: CATH118249

## 2019-11-25 SURGERY — LEFT HEART CATH AND CORONARY ANGIOGRAPHY
Anesthesia: LOCAL

## 2019-11-25 MED ORDER — OLOPATADINE HCL 0.1 % OP SOLN: 1.0000 [drp] | Freq: Two times a day (BID) | OPHTHALMIC | Status: DC

## 2019-11-25 MED ORDER — SODIUM CHLORIDE 0.9 % WEIGHT BASED INFUSION: 1.0000 mL/kg/h | INTRAVENOUS | Status: DC

## 2019-11-25 MED ORDER — ASCORBIC ACID 500 MG PO TABS: 1000.0000 mg | ORAL_TABLET | Freq: Every day | ORAL | Status: DC

## 2019-11-25 MED ORDER — FLUTICASONE PROPIONATE 50 MCG/ACT NA SUSP: 1.0000 | Freq: Every day | NASAL | Status: DC | PRN

## 2019-11-25 MED ORDER — HEPARIN (PORCINE) IN NACL 1000-0.9 UT/500ML-% IV SOLN
INTRAVENOUS | Status: AC
  Filled 2019-11-25: qty 500

## 2019-11-25 MED ORDER — SODIUM CHLORIDE 0.9% FLUSH: 3.0000 mL | INTRAVENOUS | Status: DC | PRN

## 2019-11-25 MED ORDER — VERAPAMIL HCL 2.5 MG/ML IV SOLN
INTRAVENOUS | Status: AC
  Filled 2019-11-25: qty 2

## 2019-11-25 MED ORDER — HYDRALAZINE HCL 20 MG/ML IJ SOLN
10.0000 mg | INTRAMUSCULAR | Status: DC | PRN
Start: 2019-11-25 — End: 2019-11-25

## 2019-11-25 MED ORDER — AMLODIPINE BESYLATE 5 MG PO TABS: 10.0000 mg | ORAL_TABLET | Freq: Every day | ORAL | Status: DC

## 2019-11-25 MED ORDER — FENTANYL CITRATE (PF) 100 MCG/2ML IJ SOLN
INTRAMUSCULAR | Status: DC | PRN
  Administered 2019-11-25: 25 ug via INTRAVENOUS

## 2019-11-25 MED ORDER — LIDOCAINE HCL (PF) 1 % IJ SOLN
INTRAMUSCULAR | Status: AC
  Filled 2019-11-25: qty 30

## 2019-11-25 MED ORDER — MIDAZOLAM HCL 2 MG/2ML IJ SOLN
INTRAMUSCULAR | Status: AC
  Filled 2019-11-25: qty 2

## 2019-11-25 MED ORDER — ONDANSETRON HCL 4 MG/2ML IJ SOLN: 4.0000 mg | Freq: Four times a day (QID) | INTRAMUSCULAR | Status: DC | PRN

## 2019-11-25 MED ORDER — LORATADINE 10 MG PO TABS: 10.0000 mg | ORAL_TABLET | Freq: Every day | ORAL | Status: DC

## 2019-11-25 MED ORDER — CARBIDOPA-LEVODOPA ER 50-200 MG PO TBCR: 1.0000 | EXTENDED_RELEASE_TABLET | Freq: Every day | ORAL | Status: DC

## 2019-11-25 MED ORDER — VERAPAMIL HCL 2.5 MG/ML IV SOLN
INTRAVENOUS | Status: DC | PRN
  Administered 2019-11-25: 10 mL via INTRA_ARTERIAL

## 2019-11-25 MED ORDER — SODIUM CHLORIDE 0.9% FLUSH: 3.0000 mL | Freq: Two times a day (BID) | INTRAVENOUS | Status: DC

## 2019-11-25 MED ORDER — NAPROXEN SODIUM 220 MG PO TABS: 220.0000 mg | ORAL_TABLET | Freq: Two times a day (BID) | ORAL | Status: DC | PRN

## 2019-11-25 MED ORDER — MULTI-VITAMIN/MINERALS PO TABS: 1.0000 | ORAL_TABLET | Freq: Every day | ORAL | Status: DC

## 2019-11-25 MED ORDER — TADALAFIL 2.5 MG PO TABS: 2.5000 mg | ORAL_TABLET | Freq: Every day | ORAL | Status: DC

## 2019-11-25 MED ORDER — IOHEXOL 350 MG/ML SOLN
INTRAVENOUS | Status: DC | PRN
  Administered 2019-11-25: 60 mL via INTRA_ARTERIAL

## 2019-11-25 MED ORDER — SODIUM CHLORIDE 0.9 % IV SOLN
INTRAVENOUS | Status: DC
Start: 2019-11-25 — End: 2019-11-25

## 2019-11-25 MED ORDER — LABETALOL HCL 5 MG/ML IV SOLN: 10.0000 mg | INTRAVENOUS | Status: DC | PRN

## 2019-11-25 MED ORDER — TRAZODONE HCL 50 MG PO TABS: 50.0000 mg | ORAL_TABLET | Freq: Every evening | ORAL | Status: DC | PRN

## 2019-11-25 MED ORDER — SODIUM CHLORIDE 0.9% FLUSH
3.0000 mL | Freq: Two times a day (BID) | INTRAVENOUS | Status: DC
Start: 2019-11-25 — End: 2019-11-25

## 2019-11-25 MED ORDER — MELOXICAM 15 MG PO TABS: 15.0000 mg | ORAL_TABLET | Freq: Every day | ORAL | Status: DC

## 2019-11-25 MED ORDER — LIDOCAINE HCL (PF) 1 % IJ SOLN
INTRAMUSCULAR | Status: DC | PRN
  Administered 2019-11-25: 2 mL

## 2019-11-25 MED ORDER — HEPARIN SODIUM (PORCINE) 1000 UNIT/ML IJ SOLN
INTRAMUSCULAR | Status: DC | PRN
  Administered 2019-11-25: 4500 [IU] via INTRAVENOUS

## 2019-11-25 MED ORDER — SODIUM CHLORIDE 0.9 % IV SOLN: 250.0000 mL | INTRAVENOUS | Status: DC | PRN

## 2019-11-25 MED ORDER — OXYCODONE HCL 5 MG PO TABS: 5.0000 mg | ORAL_TABLET | ORAL | Status: DC | PRN

## 2019-11-25 MED ORDER — ASPIRIN 81 MG PO CHEW: 81.0000 mg | CHEWABLE_TABLET | ORAL | Status: DC

## 2019-11-25 MED ORDER — SODIUM CHLORIDE 0.9 % IV SOLN
250.0000 mL | INTRAVENOUS | Status: DC | PRN
Start: 2019-11-25 — End: 2019-11-25

## 2019-11-25 MED ORDER — ROTIGOTINE 6 MG/24HR TD PT24: 1.0000 | MEDICATED_PATCH | Freq: Every day | TRANSDERMAL | Status: DC

## 2019-11-25 MED ORDER — ACETAMINOPHEN 325 MG PO TABS: 650.0000 mg | ORAL_TABLET | ORAL | Status: DC | PRN

## 2019-11-25 MED ORDER — IRBESARTAN 75 MG PO TABS: 37.5000 mg | ORAL_TABLET | Freq: Every day | ORAL | Status: DC

## 2019-11-25 MED ORDER — FENTANYL CITRATE (PF) 100 MCG/2ML IJ SOLN
INTRAMUSCULAR | Status: AC
  Filled 2019-11-25: qty 2

## 2019-11-25 MED ORDER — PANTOPRAZOLE SODIUM 40 MG PO TBEC: 40.0000 mg | DELAYED_RELEASE_TABLET | Freq: Every day | ORAL | Status: DC

## 2019-11-25 MED ORDER — ATORVASTATIN CALCIUM 20 MG PO TABS: 20.0000 mg | ORAL_TABLET | Freq: Every day | ORAL | Status: DC

## 2019-11-25 MED ORDER — HEPARIN (PORCINE) IN NACL 1000-0.9 UT/500ML-% IV SOLN
INTRAVENOUS | Status: DC | PRN
  Administered 2019-11-25 (×2): 500 mL

## 2019-11-25 MED ORDER — CARBIDOPA-LEVODOPA 25-100 MG PO TABS: 2.0000 | ORAL_TABLET | ORAL | Status: DC

## 2019-11-25 MED ORDER — SODIUM CHLORIDE 0.9 % WEIGHT BASED INFUSION
3.0000 mL/kg/h | INTRAVENOUS | Status: AC
  Administered 2019-11-25: 3 mL/kg/h via INTRAVENOUS

## 2019-11-25 MED ORDER — MIDAZOLAM HCL 2 MG/2ML IJ SOLN
INTRAMUSCULAR | Status: DC | PRN
  Administered 2019-11-25: 1 mg via INTRAVENOUS

## 2019-11-25 MED ORDER — HEPARIN SODIUM (PORCINE) 1000 UNIT/ML IJ SOLN
INTRAMUSCULAR | Status: AC
  Filled 2019-11-25: qty 1

## 2019-11-25 MED ORDER — NITROGLYCERIN 0.4 MG SL SUBL: 0.4000 mg | SUBLINGUAL_TABLET | SUBLINGUAL | Status: DC | PRN

## 2019-11-25 MED ORDER — CARVEDILOL 3.125 MG PO TABS: 3.1250 mg | ORAL_TABLET | Freq: Two times a day (BID) | ORAL | Status: DC

## 2019-11-25 MED ORDER — ASPIRIN EC 81 MG PO TBEC: 81.0000 mg | DELAYED_RELEASE_TABLET | Freq: Every day | ORAL | Status: DC

## 2019-11-25 SURGICAL SUPPLY — 11 items
CATH 5FR JL3.5 JR4 ANG PIG MP (CATHETERS) ×2 IMPLANT
DEVICE RAD COMP TR BAND LRG (VASCULAR PRODUCTS) ×2 IMPLANT
GLIDESHEATH SLEND A-KIT 6F 22G (SHEATH) ×2 IMPLANT
GLIDESHEATH SLEND SS 6F .021 (SHEATH) IMPLANT
GUIDEWIRE INQWIRE 1.5J.035X260 (WIRE) ×1 IMPLANT
INQWIRE 1.5J .035X260CM (WIRE) ×2
KIT HEART LEFT (KITS) ×2 IMPLANT
PACK CARDIAC CATHETERIZATION (CUSTOM PROCEDURE TRAY) ×2 IMPLANT
SHEATH PROBE COVER 6X72 (BAG) ×2 IMPLANT
TRANSDUCER W/STOPCOCK (MISCELLANEOUS) ×2 IMPLANT
TUBING CIL FLEX 10 FLL-RA (TUBING) ×2 IMPLANT

## 2019-11-25 NOTE — Interval H&P Note (Signed)
Cath Lab Visit (complete for each Cath Lab visit)  Clinical Evaluation Leading to the Procedure:   ACS: No.  Non-ACS:    Anginal Classification: CCS II  Anti-ischemic medical therapy: No Therapy  Non-Invasive Test Results: No non-invasive testing performed  Prior CABG: No previous CABG      History and Physical Interval Note:  11/25/2019 7:23 AM  Terry [name]  has presented today for surgery, with the diagnosis of angina.  The various methods of treatment have been discussed with the patient and family. After consideration of risks, benefits and other options for treatment, the patient has consented to  Procedure(s): LEFT HEART CATH AND CORONARY ANGIOGRAPHY (N/A) as a surgical intervention.  The patient's history has been reviewed, patient examined, no change in status, stable for surgery.  I have reviewed the patient's chart and labs.  Questions were answered to the patient's satisfaction.     Lyn Records III

## 2019-11-25 NOTE — CV Procedure (Signed)
   Left heart cath with coronary angiography and left ventricular assessment via right radial.  Real-time vascular ultrasound used for guidance.  Minimal luminal irregularities are noted in all vessels.  No obstructive disease is noted.  LVEF is 65%.  LVEDP is 15 mmHg.  No immediate complications.

## 2019-11-25 NOTE — Discharge Instructions (Signed)
Radial Site Care  This sheet gives you information about how to care for yourself after your procedure. Your health care provider may also give you more specific instructions. If you have problems or questions, contact your health care provider. What can I expect after the procedure? After the procedure, it is common to have:  Bruising and tenderness at the catheter insertion area. Follow these instructions at home: Medicines  Take over-the-counter and prescription medicines only as told by your health care provider. Insertion site care  Follow instructions from your health care provider about how to take care of your insertion site. Make sure you: ? Wash your hands with soap and water before you change your bandage (dressing). If soap and water are not available, use hand sanitizer. ? Change your dressing as told by your health care provider. ? Leave stitches (sutures), skin glue, or adhesive strips in place. These skin closures may need to stay in place for 2 weeks or longer. If adhesive strip edges start to loosen and curl up, you may trim the loose edges. Do not remove adhesive strips completely unless your health care provider tells you to do that.  Check your insertion site every day for signs of infection. Check for: ? Redness, swelling, or pain. ? Fluid or blood. ? Pus or a bad smell. ? Warmth.  Do not take baths, swim, or use a hot tub until your health care provider approves.  You may shower 24-48 hours after the procedure, or as directed by your health care provider. ? Remove the dressing and gently wash the site with plain soap and water. ? Pat the area dry with a clean towel. ? Do not rub the site. That could cause bleeding.  Do not apply powder or lotion to the site. Activity   For 24 hours after the procedure, or as directed by your health care provider: ? Do not flex or bend the affected arm. ? Do not push or pull heavy objects with the affected arm. ? Do not  drive yourself home from the hospital or clinic. You may drive 24 hours after the procedure unless your health care provider tells you not to. ? Do not operate machinery or power tools.  Do not lift anything that is heavier than 10 lb (4.5 kg), or the limit that you are told, until your health care provider says that it is safe.  Ask your health care provider when it is okay to: ? Return to work or school. ? Resume usual physical activities or sports. ? Resume sexual activity. General instructions  If the catheter site starts to bleed, raise your arm and put firm pressure on the site. If the bleeding does not stop, get help right away. This is a medical emergency.  If you went home on the same day as your procedure, a responsible adult should be with you for the first 24 hours after you arrive home.  Keep all follow-up visits as told by your health care provider. This is important. Contact a health care provider if:  You have a fever.  You have redness, swelling, or yellow drainage around your insertion site. Get help right away if:  You have unusual pain at the radial site.  The catheter insertion area swells very fast.  The insertion area is bleeding, and the bleeding does not stop when you hold steady pressure on the area.  Your arm or hand becomes pale, cool, tingly, or numb. These symptoms may represent a serious problem   that is an emergency. Do not wait to see if the symptoms will go away. Get medical help right away. Call your local emergency services (911 in the [place] Do not drive yourself to the hospital. Summary  After the procedure, it is common to have bruising and tenderness at the site.  Follow instructions from your health care provider about how to take care of your radial site wound. Check the wound every day for signs of infection.  Do not lift anything that is heavier than 10 lb (4.5 kg), or the limit that you are told, until your health care provider says  that it is safe. This information is not intended to replace advice given to you by your health care provider. Make sure you discuss any questions you have with your health care provider. Document Revised: 08/15/2017 Document Reviewed: 08/15/2017 Elsevier Patient Education  2020 [employer]  

## 2019-11-26 ENCOUNTER — Encounter: Payer: Medicare PPO | Admitting: Internal Medicine

## 2019-11-27 DIAGNOSIS — N5201 Erectile dysfunction due to arterial insufficiency: Secondary | ICD-10-CM | POA: Diagnosis not present

## 2019-12-01 ENCOUNTER — Encounter: Payer: Self-pay | Admitting: Internal Medicine

## 2019-12-04 NOTE — Telephone Encounter (Signed)
Follow up  Patient called in. Confirmed 2:20 pm appointment with patient. States this appointment time will work and he will be in tomorrow.

## 2019-12-04 NOTE — Progress Notes (Signed)
Cardiology Office Note:   Date:  12/05/2019  NAME:  Terry [name]    MRN: [id number] DOB:  May 08, 1946   PCP:  Lanelle Bal, MD  Cardiologist:  [name], MD   Referring MD: Lanelle Bal, MD   Chief Complaint  Patient presents with  . Follow-up   History of Present Illness:   Terry [name] is a 74 y.o. male with a hx of HTN, HLD, Parkinson's disease who presents for follow-up. Evaluated 3/25 for typical angina. Recent LHC with minimal CAD.  He reports he is relieved to know that his heart arteries look good.  He had minimal plaque.  I did review his most recent lipid profile with an LDL of 38.  I see no need for statin therapy at this time.  His EKG is normal.  He reports that he has had issues with erectile dysfunction.  He has issues maintaining and achieving an erection.  He reports he tried several erectile dysfunction medications that might help him.  He was advised by his urologist to seek a cardiology evaluation.  He has no significant risk factors for PAD.  He did use some tobacco products but minimal in the past.  He reports 3 years of smoking and maybe 3 years of snuff use.  He is no longer using tobacco products.  He will recently be remarried and needs to get this issue resolved per his report.  He is on several medications such as a beta-blocker as well as his Parkinson's medications which can cause erectile dysfunction.  He has discussed with his neurologist this possibly before and he has recommended our evaluation first before any change to medications.  I do agree with that.  On examination his pulses are 2+ bilaterally lower extremities.  He has 2+ femoral pulses bilaterally as well as 2+ popliteal and 2+ DP and PT pulses.  He has no symptoms of claudications with activity.  He has no real risk factors for erectile dysfunction other than hypertension.  He denies chest pain, shortness of breath or palpitations today.  I instructed him to stop taking any  nitroglycerin as his chest pain is not related to his heart.  2019 cholesterol panel: Total cholesterol 78, HDL 33, LDL 38, triglycerides 34  Problem List 1. Non-obstructive CAD -LHC 11/25/2019 with 20% mid LAD 2. Parkinson's   Past Medical History: Past Medical History:  Diagnosis Date  . Carotid artery stenosis 04/29/2018  . Chronic tension headaches 04/29/2018  . Essential hypertension 04/29/2018  . GERD (gastroesophageal reflux disease) 04/29/2018  . HLD (hyperlipidemia) 04/29/2018  . Parkinson's disease (HCC) 04/29/2018    Past Surgical History: Past Surgical History:  Procedure Laterality Date  . LEFT HEART CATH AND CORONARY ANGIOGRAPHY N/A 11/25/2019   Procedure: LEFT HEART CATH AND CORONARY ANGIOGRAPHY;  Surgeon: Lyn Records, MD;  Location: [facility];  Service: Cardiovascular;  Laterality: N/A;  . TONSILLECTOMY      Current Medications: Current Meds  Medication Sig  . amLODipine (NORVASC) 10 MG tablet Take 1 tablet (10 mg total) by mouth daily.  . Ascorbic Acid (VITAMIN C) 1000 MG tablet Take 1,000 mg by mouth daily.  Marland Kitchen aspirin EC 81 MG tablet Take 81 mg by mouth daily.  Marland Kitchen atorvastatin (LIPITOR) 20 MG tablet TAKE ONE TABLET BY MOUTH EVERY NIGHT AT BEDTIME  . betamethasone acetate-betamethasone sodium phosphate (CELESTONE) 6 (3-3) MG/ML injection 3 1/3 cc Celestone Soluspan 6mg /ml  . carbidopa-levodopa (SINEMET CR) 50-200 MG tablet Take 1 tablet by  mouth at bedtime.  . carbidopa-levodopa (SINEMET IR) 25-100 MG tablet Take 2-2.5 tablets by mouth See admin instructions. 2.5 tablets 1st thing in the morning, 2 tablets 4 times daily  . cetirizine (ZYRTEC) 10 MG tablet Take 10 mg by mouth as needed for allergies.   . fluticasone (FLONASE) 50 MCG/ACT nasal spray Place 1 spray into both nostrils daily as needed for allergies.   . meloxicam (MOBIC) 15 MG tablet Take 15 mg by mouth daily as needed for pain.   . Multiple Vitamins-Minerals (MULTIVITAMIN WITH MINERALS) tablet  Take 1 tablet by mouth daily.  . naproxen sodium (ALEVE) 220 MG tablet Take 220 mg by mouth 2 (two) times daily as needed (pain).   Marland Kitchen. NEUPRO 6 MG/24HR Place 1 patch onto the skin daily.   . nitroGLYCERIN (NITROSTAT) 0.4 MG SL tablet Place 1 tablet (0.4 mg total) under the tongue every 5 (five) minutes as needed for chest pain.  Marland Kitchen. Olopatadine HCl (PATADAY) 0.2 % SOLN Apply 1 drop to eye daily as needed (allergies).  Marland Kitchen. omeprazole (PRILOSEC) 40 MG capsule TAKE ONE CAPSULE BY MOUTH DAILY  . Tadalafil (CIALIS) 2.5 MG TABS Take 1 tablet (2.5 mg total) by mouth daily.  . traZODone (DESYREL) 50 MG tablet Take 1 tablet (50 mg total) by mouth at bedtime as needed for sleep.  . valsartan (DIOVAN) 160 MG tablet Take 1 tablet (160 mg total) by mouth daily.  . [DISCONTINUED] carvedilol (COREG) 3.125 MG tablet Take 1 tablet (3.125 mg total) by mouth 2 (two) times daily.     Allergies:    Penicillins and Erythromycin   Social History: Social History   Socioeconomic History  . Marital status: Widowed    Spouse name: Not on file  . Number of children: Not on file  . Years of education: Not on file  . Highest education level: Not on file  Occupational History  . Not on file  Tobacco Use  . Smoking status: Never Smoker  . Smokeless tobacco: Current User    Types: Snuff  Substance and Sexual Activity  . Alcohol use: Yes    Comment: Occasionally.  . Drug use: Never  . Sexual activity: Not on file  Other Topics Concern  . Not on file  Social History Narrative  . Not on file   Social Determinants of Health   Financial Resource Strain:   . Difficulty of Paying Living Expenses:   Food Insecurity:   . Worried About Programme researcher, broadcasting/film/videounning Out of Food in the Last Year:   . Baristaan Out of Food in the Last Year:   Transportation Needs:   . Freight forwarderLack of Transportation (Medical):   Marland Kitchen. Lack of Transportation (Non-Medical):   Physical Activity:   . Days of Exercise per Week:   . Minutes of Exercise per Session:   Stress:     . Feeling of Stress :   Social Connections:   . Frequency of Communication with Friends and Family:   . Frequency of Social Gatherings with Friends and Family:   . Attends Religious Services:   . Active Member of Clubs or Organizations:   . Attends BankerClub or Organization Meetings:   Marland Kitchen. Marital Status:      Family History: The patient's family history includes Cancer in his father; Heart disease in his mother.  ROS:   All other ROS reviewed and negative. Pertinent positives noted in the HPI.     EKGs/Labs/Other Studies Reviewed:   The following studies were personally reviewed by me today:  EKG:  EKG is ordered today.  The ekg ordered today demonstrates normal sinus rhythm, heart rate 65, first-degree AV block, 220 ms, no acute ST-T changes, no prior infarction, and was personally reviewed by me.   TTE 10/31/2019 1. Left ventricular ejection fraction, by estimation, is 65 to 70%. The  left ventricle has normal function. The left ventricle has no regional  wall motion abnormalities. 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 Left ventricular diastolic parameters are consistent with Grade  II diastolic dysfunction (pseudonormalization). Elevated left atrial  pressure.  2. Right ventricular systolic function is normal. The right ventricular  size is mildly enlarged. Tricuspid regurgitation signal is inadequate for  assessing PA pressure.  3. Left atrial size was moderately dilated.  4. The mitral valve is normal in structure. No evidence of mitral valve  regurgitation.  5. The aortic valve is tricuspid. Aortic valve regurgitation is not  visualized. No aortic stenosis is present.  6. The inferior vena cava is dilated in size with >50% respiratory  variability, suggesting right atrial pressure of 8 mmHg.   LHC 11/25/2019 Mid LAD <20%  Recent Labs: 11/18/2019: BUN 23; Creatinine, Ser 1.01; Hemoglobin 15.9; Platelets 233; Potassium  4.6; Sodium 143   Recent Lipid Panel No results found for: CHOL, TRIG, HDL, CHOLHDL, VLDL, LDLCALC, LDLDIRECT  Physical Exam:   VS:  BP 118/62   Pulse 65   Ht 6\' 1"  (1.854 m)   Wt 186 lb 9.6 oz (84.6 kg)   SpO2 97%   BMI 24.62 kg/m    Wt Readings from Last 3 Encounters:  12/05/19 186 lb 9.6 oz (84.6 kg)  11/25/19 184 lb (83.5 kg)  11/18/19 182 lb 6.4 oz (82.7 kg)    General: Well nourished, well developed, in no acute distress Heart: Atraumatic, normal size  Eyes: PEERLA, EOMI  Neck: Supple, no JVD Endocrine: No thryomegaly Cardiac: Normal S1, S2; RRR; no murmurs, rubs, or gallops Lungs: Clear to auscultation bilaterally, no wheezing, rhonchi or rales  Abd: Soft, nontender, no hepatomegaly  Ext: No edema, pulses 2+ femoral bilaterally, 2+ popliteal bilaterally, 2+ DP PT bilaterally Musculoskeletal: No deformities, BUE and BLE strength normal and equal Skin: Warm and dry, no rashes   Neuro: Alert and oriented to person, place, time, and situation, CNII-XII grossly intact, no focal deficits  Psych: Normal mood and affect   ASSESSMENT:   Aldin Drees is a 74 y.o. male who presents for the following: 1. Coronary artery disease involving native coronary artery of native heart without angina pectoris   2. Essential hypertension   3. Mixed hyperlipidemia   4. Erectile dysfunction, unspecified erectile dysfunction type     PLAN:   1. Coronary artery disease involving native coronary artery of native heart without angina pectoris -He was having chest pain.  Recent left heart cath with minimal CAD.  He can stop his nitroglycerin.  His most recent LDL cholesterol was 38.  Seems to be doing well.  2. Essential hypertension -Blood pressure well controlled.  Given issues with erectile dysfunction we will stop his beta-blocker.  He will continue his ARB.  3. Mixed hyperlipidemia -Most recent LDL cholesterol 38.  No strong indication for statin at this time.  4. Erectile  dysfunction, unspecified erectile dysfunction type -He does have issues with erectile dysfunction.  Pulses are 2+ bilaterally lower extremities.  There is no evidence of bruits or any evidence of PAD.  He does not have significant PAD risk factors.  I suspect this is related to his Parkinson's medications.  For now, we will proceed with an abdominal iliac ultrasounds of his arteries just to ensure he has no evidence of PAD.  I have a low suspicion for this.  I will follow the results with him by phone.  Should he have an abnormality we will see him back.  Otherwise I will ask him to see [place] as needed and to go back to his neurologist to determine if you need his Parkinson medications to be changed as these likely may be the source of his erectile function.   Disposition: No follow-ups on file.  Medication Adjustments/Labs and Tests Ordered: Current medicines are reviewed at length with the patient today.  Concerns regarding medicines are outlined above.  Orders Placed This Encounter  Procedures  . EKG 12-Lead  . VAS US AORTA/IVC/ILIACS   No orders of the defined types were placed in this encounter.   Patient Instructions  Medication Instructions:   STOP CARVEDILOL  *If you need a refill on your cardiac medications before your next appointment, please call your pharmacy*   Lab Work: If you have labs (blood work) drawn today and your tests are completely normal, you will receive your results only by: Marland Kitchen MyChart Message (if you have MyChart) OR . A paper copy in the mail If you have any lab test that is abnormal or we need to change your treatment, we will call you to review the results.   Testing/Procedures:  Your physician has requested that you have an Aorta/Iliac Duplex. This will be take place at [address] [facility], [address].    No food after 11PM the night before.  Water is OK. (Don't drink liquids if you have been instructed not to for ANOTHER test).  Take two Extra-Strength  Gas-X capsules at bedtime the night before test.   Take an additional two Extra-Strength Gas-X capsules three (3) hours before the test or first thing in the morning.    Avoid foods that produce bowel gas, for 24 hours prior to exam (see below).    No breakfast, no chewing gum, no smoking or carbonated beverages.  Patient may take morning medications with water.  Come in for test at least 15 minutes early to register.     Follow-Up: At [facility], you and your health needs are our priority.  As part of our continuing mission to provide you with exceptional heart care, we have created designated Provider Care Teams.  These Care Teams include your primary Cardiologist (physician) and Advanced Practice Providers (APPs -  Physician Assistants and Nurse Practitioners) who all work together to provide you with the care you need, when you need it.  We recommend signing up for the patient portal called "MyChart".  Sign up information is provided on this After Visit Summary.  MyChart is used to connect with patients for Virtual Visits (Telemedicine).  Patients are able to view lab/test results, encounter notes, upcoming appointments, etc.  Non-urgent messages can be sent to your provider as well.   To learn more about what you can do with MyChart, go to [email or web address].    Your next appointment:    AS NEEDED    Time Spent with Patient: I have spent a total of 25 minutes with patient reviewing hospital notes, telemetry, EKGs, labs and examining the patient as well as establishing an assessment and plan that was discussed with the patient.  > 50% of time was spent in direct patient care.  Signed, Addison Naegeli. Audie Box, [place]  [address], [place], [place] [address] [phone]  12/05/2019 2:56 PM

## 2019-12-05 ENCOUNTER — Encounter: Payer: Self-pay | Admitting: Cardiovascular Disease

## 2019-12-05 ENCOUNTER — Other Ambulatory Visit: Payer: Self-pay

## 2019-12-05 ENCOUNTER — Ambulatory Visit: Payer: Medicare PPO | Admitting: Cardiovascular Disease

## 2019-12-05 VITALS — BP 118/62 | HR 65 | Ht 73.0 in | Wt 186.6 lb

## 2019-12-05 DIAGNOSIS — I10 Essential (primary) hypertension: Secondary | ICD-10-CM | POA: Diagnosis not present

## 2019-12-05 DIAGNOSIS — E782 Mixed hyperlipidemia: Secondary | ICD-10-CM

## 2019-12-05 DIAGNOSIS — N529 Male erectile dysfunction, unspecified: Secondary | ICD-10-CM

## 2019-12-05 DIAGNOSIS — I2510 Atherosclerotic heart disease of native coronary artery without angina pectoris: Secondary | ICD-10-CM | POA: Diagnosis not present

## 2019-12-05 NOTE — Patient Instructions (Signed)
Medication Instructions:   STOP CARVEDILOL  *If you need a refill on your cardiac medications before your next appointment, please call your pharmacy*   Lab Work: If you have labs (blood work) drawn today and your tests are completely normal, you will receive your results only by: Marland Kitchen MyChart Message (if you have MyChart) OR . A paper copy in the mail If you have any lab test that is abnormal or we need to change your treatment, we will call you to review the results.   Testing/Procedures:  Your physician has requested that you have an Aorta/Iliac Duplex. This will be take place at [address] [facility], [address].    No food after 11PM the night before.  Water is OK. (Don't drink liquids if you have been instructed not to for ANOTHER test).  Take two Extra-Strength Gas-X capsules at bedtime the night before test.   Take an additional two Extra-Strength Gas-X capsules three (3) hours before the test or first thing in the morning.    Avoid foods that produce bowel gas, for 24 hours prior to exam (see below).    No breakfast, no chewing gum, no smoking or carbonated beverages.  Patient may take morning medications with water.  Come in for test at least 15 minutes early to register.     Follow-Up: At [facility], you and your health needs are our priority.  As part of our continuing mission to provide you with exceptional heart care, we have created designated Provider Care Teams.  These Care Teams include your primary Cardiologist (physician) and Advanced Practice Providers (APPs -  Physician Assistants and Nurse Practitioners) who all work together to provide you with the care you need, when you need it.  We recommend signing up for the patient portal called "MyChart".  Sign up information is provided on this After Visit Summary.  MyChart is used to connect with patients for Virtual Visits (Telemedicine).  Patients are able to view lab/test results, encounter notes, upcoming  appointments, etc.  Non-urgent messages can be sent to your provider as well.   To learn more about what you can do with MyChart, go to [email or web address].    Your next appointment:    AS NEEDED

## 2019-12-09 ENCOUNTER — Telehealth: Payer: Self-pay | Admitting: *Deleted

## 2019-12-09 ENCOUNTER — Ambulatory Visit: Payer: Medicare PPO | Admitting: Cardiovascular Disease

## 2019-12-09 NOTE — Telephone Encounter (Signed)
rtc to pt, gave him dr butcher's instructions along with having feet flat on floor and to sit at rest for appr 15- 20 mins before checking bp. He is agreeable. Also ask him twice to bring his home monitor in to appt and he is agreeable appt 5/19 at 1015

## 2019-12-09 NOTE — Telephone Encounter (Signed)
Make certain he is sitting at rest, no recent eating / drinking / smoking before checking BP. Yes, [facility] appt with home BP monitor

## 2019-12-09 NOTE — Telephone Encounter (Signed)
Pt calls and states his BP at 1430 today is 213/109 5/17 at 2330 bp 200/98, 5/16 bp 151/108, 5/15 at 2000 bp 137/67. He denies N&V, chest pain, vision changes, short of breath Pt states he needs something done, would you like him to come to [facility] or suggest something over ph? Sending to dr Crista Elliot and attending.

## 2019-12-09 NOTE — Telephone Encounter (Signed)
Thank you Myriam Jacobson. He was just at his cardiologists office and they stopped his metoprolol. I am afraid he may have to resume this or reconsider the clonidine we previously advised.

## 2019-12-10 ENCOUNTER — Ambulatory Visit: Payer: Medicare PPO | Admitting: Internal Medicine

## 2019-12-10 ENCOUNTER — Other Ambulatory Visit: Payer: Self-pay

## 2019-12-10 VITALS — BP 133/80 | HR 68 | Temp 98.2°F | Ht 73.0 in | Wt 187.8 lb

## 2019-12-10 DIAGNOSIS — G20 Parkinson's disease: Secondary | ICD-10-CM

## 2019-12-10 DIAGNOSIS — I10 Essential (primary) hypertension: Secondary | ICD-10-CM | POA: Diagnosis not present

## 2019-12-10 DIAGNOSIS — E785 Hyperlipidemia, unspecified: Secondary | ICD-10-CM | POA: Diagnosis not present

## 2019-12-10 DIAGNOSIS — E782 Mixed hyperlipidemia: Secondary | ICD-10-CM

## 2019-12-10 LAB — GLUCOSE, CAPILLARY: Glucose-Capillary: 119 mg/dL — ABNORMAL HIGH (ref 70–99)

## 2019-12-10 LAB — POCT GLYCOSYLATED HEMOGLOBIN (HGB A1C): Hemoglobin A1C: 5.8 % — AB (ref 4.0–5.6)

## 2019-12-10 NOTE — Progress Notes (Signed)
   CC: elevated BP  HPI:  Terry [name] is a 74 y.o. male with PMHx as stated below. Patient presenting with concerns for elevated blood pressure readings at home. Please see problem based charting for further assessment and plan.   Past Medical History:  Diagnosis Date  . Carotid artery stenosis 04/29/2018  . Chronic tension headaches 04/29/2018  . Essential hypertension 04/29/2018  . GERD (gastroesophageal reflux disease) 04/29/2018  . HLD (hyperlipidemia) 04/29/2018  . Parkinson's disease (HCC) 04/29/2018   Review of Systems:  Negative except as stated in HPI.   Physical Exam:  Vitals:   12/10/19 1014 12/10/19 1025  BP: 125/60 133/80  Pulse: 68   Temp: 98.2 F (36.8 C)   TempSrc: Oral   SpO2: 97%   Weight: 187 lb 12.8 oz (85.2 kg)   Height: 6\' 1"  (1.854 m)    Physical Exam Vitals reviewed.  Constitutional:      General: He is not in acute distress.    Appearance: Normal appearance. He is not diaphoretic.  Cardiovascular:     Rate and Rhythm: Normal rate and regular rhythm.     Pulses: Normal pulses.     Heart sounds: Normal heart sounds. No murmur.  Pulmonary:     Effort: Pulmonary effort is normal. No respiratory distress.     Breath sounds: Normal breath sounds. No wheezing or rales.  Skin:    General: Skin is warm and dry.     Capillary Refill: Capillary refill takes less than 2 seconds.  Neurological:     General: No focal deficit present.     Mental Status: He is alert and oriented to person, place, and time. Mental status is at baseline.     Cranial Nerves: No cranial nerve deficit.     Sensory: No sensory deficit.     Motor: No weakness.     Coordination: Coordination normal.     Gait: Gait normal.      Assessment & Plan:   See Encounters Tab for problem based charting.  Patient discussed with Dr. 

## 2019-12-10 NOTE — Patient Instructions (Addendum)
Mr. Montour,  It was a pleasure seeing you in clinic. Today we discussed:  Elevated blood pressure:  Your BP at this visit is 133/80.  At this time, please continue to take valsartan 160 mg daily.  Due to your Parkinson's disease, the fluctuations in your blood pressure are expected.  However, our goal is to keep your blood pressure less than 180/100.  We would also like to avoid any low blood pressures as this can dangerous.  As such, please use caution to get up from seated position slowly and remain hydrated. I will also check some lab work at this visit.   Please contact us if you have any questions or concerns  Thank you!

## 2019-12-10 NOTE — Assessment & Plan Note (Signed)
Terry [name] has history of advanced Parkinsons disease for which he follows with Dr. Heywood Footman. Patient's last visit with Dr. Heywood Footman on 09/30/2019 and was noted to have mild progression of his symptoms. He is continued on Sinamet 25-100 2.5 tablets in AM, 2 tablets at noon, 2 tablets at 5pm and 2 tablets at 10pm and Sinamet 50-200 at bedtime. He is also using Neupro patch daily. Patient notes symptoms are currently well controlled.   Plan: Continue to follow with neurologist Continue current treatment regimen with Sinamet and Neupro patch

## 2019-12-10 NOTE — Progress Notes (Signed)
Internal Medicine Clinic Attending  Case discussed with Dr. Aslam at the time of the visit.  We reviewed the resident's history and exam and pertinent patient test results.  I agree with the assessment, diagnosis, and plan of care documented in the resident's note.  

## 2019-12-10 NOTE — Assessment & Plan Note (Addendum)
Mr. Salais presented with concerns of elevated blood pressure.  Patient notes that he has been evaluated multiple times for this concern.  Most recently, he was evaluated by cardiologist and recommended to discontinue his beta-blocker.  Patient was also advised to hold off on clonidine patch.  Recommended for valsartan 160 mg daily which the patient has been taking every night.  Patient notes that his blood pressure has been labile with systolic in 150-170 and diastolic 90-100.  Patient's wife does note 1 episode of hypotension with blood pressure 108/33 following his left heart cath at which time the patient did experience symptoms including significant lethargy.  This improved without intervention.  Patient denies any chest pain, headache, blurry vision, weakness.  Historically, patient's blood pressures have been difficult to control given his Parkinson's disease and interactions between levodopa and antihypertensives including CCB and hydralazine. 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 Patient recently underwent LHC with minimal CAD noted. Patient would benefit from less stringent BP goals to prevent further hypotensive episodes. Also advised to check BP only twice daily unless symptomatic.   Plan: Continue valsartan 100mg  daily Goal BP <180/100 Will hold off on clonidine patch at this time to prevent further hypotensive episodes

## 2019-12-10 NOTE — Assessment & Plan Note (Signed)
Lipid panel obtained at this visit. Patient continued on atorvastatin 20mg  daily.

## 2019-12-10 NOTE — Addendum Note (Signed)
Addended by: Erlinda Hong T on: 12/10/2019 02:48 PM   Modules accepted: Level of Service

## 2019-12-11 LAB — LIPID PANEL
Chol/HDL Ratio: 2.1 ratio (ref 0.0–5.0)
Cholesterol, Total: 99 mg/dL — ABNORMAL LOW (ref 100–199)
HDL: 47 mg/dL (ref >39)
LDL Chol Calc (NIH): 39 mg/dL (ref 0–99)
Triglycerides: 53 mg/dL (ref 0–149)
VLDL Cholesterol Cal: 13 mg/dL (ref 5–40)

## 2019-12-16 DIAGNOSIS — N5201 Erectile dysfunction due to arterial insufficiency: Secondary | ICD-10-CM | POA: Diagnosis not present

## 2019-12-19 ENCOUNTER — Other Ambulatory Visit: Payer: Self-pay

## 2019-12-19 ENCOUNTER — Ambulatory Visit (HOSPITAL_COMMUNITY)
Admission: RE | Admit: 2019-12-19 | Discharge: 2019-12-19 | Disposition: A | Discharge status: Home / Self Care | Payer: Medicare PPO | Source: Ambulatory Visit | Attending: Cardiovascular Disease | Admitting: Cardiovascular Disease

## 2019-12-19 DIAGNOSIS — N529 Male erectile dysfunction, unspecified: Secondary | ICD-10-CM | POA: Insufficient documentation

## 2019-12-23 ENCOUNTER — Other Ambulatory Visit: Payer: Self-pay | Admitting: Internal Medicine

## 2019-12-29 ENCOUNTER — Other Ambulatory Visit: Payer: Self-pay | Admitting: Internal Medicine

## 2019-12-29 DIAGNOSIS — I10 Essential (primary) hypertension: Secondary | ICD-10-CM

## 2019-12-29 NOTE — Telephone Encounter (Signed)
Clonidine discontinued by cardiologist and noted by PCP to have hypotension.

## 2020-01-16 ENCOUNTER — Telehealth: Payer: Self-pay | Admitting: Cardiovascular Disease

## 2020-01-16 NOTE — Telephone Encounter (Signed)
I attempted to contact patient 01/16/20 to schedule follow up visit from patients recall list. The patient didn't answer, left message for patient to return call to schedule appt.

## 2020-01-19 ENCOUNTER — Encounter: Payer: Self-pay | Admitting: *Deleted

## 2020-02-03 DIAGNOSIS — M170 Bilateral primary osteoarthritis of knee: Secondary | ICD-10-CM | POA: Diagnosis not present

## 2020-02-11 DIAGNOSIS — M170 Bilateral primary osteoarthritis of knee: Secondary | ICD-10-CM | POA: Diagnosis not present

## 2020-02-14 ENCOUNTER — Other Ambulatory Visit: Payer: Self-pay | Admitting: Internal Medicine

## 2020-02-16 ENCOUNTER — Other Ambulatory Visit: Payer: Self-pay | Admitting: Internal Medicine

## 2020-02-19 DIAGNOSIS — M170 Bilateral primary osteoarthritis of knee: Secondary | ICD-10-CM | POA: Diagnosis not present

## 2020-02-25 ENCOUNTER — Telehealth: Payer: Self-pay

## 2020-02-25 NOTE — Telephone Encounter (Signed)
RX was already sent via [facility] for a 90 day supply on 02/16/20 per Dr. Elaina Pattee.  RN called Karin Golden to ensure pharmacy received RX, could not get through to anyone at pharmacy.  TC to patient, VM obtained and message left RX was already sent to pharmacy last week and per computer system, RX did go through. SChaplin, RN,BSN

## 2020-02-25 NOTE — Telephone Encounter (Signed)
atorvastatin (LIPITOR) 20 MG tablet, refill request @  [facility] [address], [place] - [address] [place] Phone:  [phone]  Fax:  [phone]

## 2020-03-09 DIAGNOSIS — R471 Dysarthria and anarthria: Secondary | ICD-10-CM | POA: Diagnosis not present

## 2020-03-09 DIAGNOSIS — G20 Parkinson's disease: Secondary | ICD-10-CM | POA: Diagnosis not present

## 2020-03-23 DIAGNOSIS — M170 Bilateral primary osteoarthritis of knee: Secondary | ICD-10-CM | POA: Diagnosis not present

## 2020-03-23 DIAGNOSIS — M67911 Unspecified disorder of synovium and tendon, right shoulder: Secondary | ICD-10-CM | POA: Diagnosis not present

## 2020-03-23 DIAGNOSIS — M1712 Unilateral primary osteoarthritis, left knee: Secondary | ICD-10-CM | POA: Diagnosis not present

## 2020-03-23 DIAGNOSIS — G20 Parkinson's disease: Secondary | ICD-10-CM | POA: Diagnosis not present

## 2020-03-23 DIAGNOSIS — M1711 Unilateral primary osteoarthritis, right knee: Secondary | ICD-10-CM | POA: Diagnosis not present

## 2020-03-23 DIAGNOSIS — K5790 Diverticulosis of intestine, part unspecified, without perforation or abscess without bleeding: Secondary | ICD-10-CM | POA: Diagnosis not present

## 2020-03-23 DIAGNOSIS — I10 Essential (primary) hypertension: Secondary | ICD-10-CM | POA: Diagnosis not present

## 2020-03-23 DIAGNOSIS — I6529 Occlusion and stenosis of unspecified carotid artery: Secondary | ICD-10-CM | POA: Diagnosis not present

## 2020-03-23 DIAGNOSIS — E782 Mixed hyperlipidemia: Secondary | ICD-10-CM | POA: Diagnosis not present

## 2020-03-24 ENCOUNTER — Other Ambulatory Visit: Payer: Self-pay | Admitting: Orthopedic Surgery

## 2020-03-24 DIAGNOSIS — M25561 Pain in right knee: Secondary | ICD-10-CM

## 2020-03-26 DIAGNOSIS — G20 Parkinson's disease: Secondary | ICD-10-CM | POA: Diagnosis not present

## 2020-03-26 DIAGNOSIS — R269 Unspecified abnormalities of gait and mobility: Secondary | ICD-10-CM | POA: Diagnosis not present

## 2020-03-31 DIAGNOSIS — R269 Unspecified abnormalities of gait and mobility: Secondary | ICD-10-CM | POA: Diagnosis not present

## 2020-03-31 DIAGNOSIS — G20 Parkinson's disease: Secondary | ICD-10-CM | POA: Diagnosis not present

## 2020-04-02 DIAGNOSIS — G20 Parkinson's disease: Secondary | ICD-10-CM | POA: Diagnosis not present

## 2020-04-02 DIAGNOSIS — R269 Unspecified abnormalities of gait and mobility: Secondary | ICD-10-CM | POA: Diagnosis not present

## 2020-04-07 DIAGNOSIS — G20 Parkinson's disease: Secondary | ICD-10-CM | POA: Diagnosis not present

## 2020-04-07 DIAGNOSIS — R269 Unspecified abnormalities of gait and mobility: Secondary | ICD-10-CM | POA: Diagnosis not present

## 2020-04-09 DIAGNOSIS — R269 Unspecified abnormalities of gait and mobility: Secondary | ICD-10-CM | POA: Diagnosis not present

## 2020-04-09 DIAGNOSIS — G20 Parkinson's disease: Secondary | ICD-10-CM | POA: Diagnosis not present

## 2020-04-11 ENCOUNTER — Ambulatory Visit
Admission: RE | Admit: 2020-04-11 | Discharge: 2020-04-11 | Disposition: A | Discharge status: Home / Self Care | Payer: Medicare PPO | Source: Ambulatory Visit | Attending: Orthopedic Surgery | Admitting: Orthopedic Surgery

## 2020-04-11 ENCOUNTER — Other Ambulatory Visit: Payer: Self-pay

## 2020-04-11 DIAGNOSIS — M25561 Pain in right knee: Secondary | ICD-10-CM

## 2020-04-13 ENCOUNTER — Other Ambulatory Visit: Payer: Self-pay

## 2020-04-13 DIAGNOSIS — Z0000 Encounter for general adult medical examination without abnormal findings: Secondary | ICD-10-CM | POA: Diagnosis not present

## 2020-04-13 DIAGNOSIS — G20 Parkinson's disease: Secondary | ICD-10-CM | POA: Diagnosis not present

## 2020-04-13 DIAGNOSIS — I10 Essential (primary) hypertension: Secondary | ICD-10-CM | POA: Diagnosis not present

## 2020-04-13 MED ORDER — OMEPRAZOLE 40 MG PO CPDR: 40.0000 mg | DELAYED_RELEASE_CAPSULE | Freq: Every day | ORAL | 0 refills | Status: DC

## 2020-04-23 DIAGNOSIS — K5790 Diverticulosis of intestine, part unspecified, without perforation or abscess without bleeding: Secondary | ICD-10-CM | POA: Diagnosis not present

## 2020-04-23 DIAGNOSIS — Z0000 Encounter for general adult medical examination without abnormal findings: Secondary | ICD-10-CM | POA: Diagnosis not present

## 2020-04-23 DIAGNOSIS — E782 Mixed hyperlipidemia: Secondary | ICD-10-CM | POA: Diagnosis not present

## 2020-04-23 DIAGNOSIS — G20 Parkinson's disease: Secondary | ICD-10-CM | POA: Diagnosis not present

## 2020-04-23 DIAGNOSIS — I10 Essential (primary) hypertension: Secondary | ICD-10-CM | POA: Diagnosis not present

## 2020-04-23 DIAGNOSIS — I6529 Occlusion and stenosis of unspecified carotid artery: Secondary | ICD-10-CM | POA: Diagnosis not present

## 2020-05-19 ENCOUNTER — Other Ambulatory Visit: Payer: Self-pay | Admitting: Student

## 2020-06-14 DIAGNOSIS — J0110 Acute frontal sinusitis, unspecified: Secondary | ICD-10-CM | POA: Diagnosis not present

## 2020-06-14 DIAGNOSIS — R059 Cough, unspecified: Secondary | ICD-10-CM | POA: Diagnosis not present

## 2020-06-14 DIAGNOSIS — Z20822 Contact with and (suspected) exposure to covid-19: Secondary | ICD-10-CM | POA: Diagnosis not present

## 2020-06-14 DIAGNOSIS — Z03818 Encounter for observation for suspected exposure to other biological agents ruled out: Secondary | ICD-10-CM | POA: Diagnosis not present

## 2020-06-14 DIAGNOSIS — Z0131 Encounter for examination of blood pressure with abnormal findings: Secondary | ICD-10-CM | POA: Diagnosis not present

## 2020-06-14 DIAGNOSIS — I10 Essential (primary) hypertension: Secondary | ICD-10-CM | POA: Diagnosis not present

## 2020-06-24 ENCOUNTER — Ambulatory Visit: Payer: Medicare PPO | Admitting: Urology

## 2020-07-06 DIAGNOSIS — M542 Cervicalgia: Secondary | ICD-10-CM | POA: Diagnosis not present

## 2020-07-08 DIAGNOSIS — M5030 Other cervical disc degeneration, unspecified cervical region: Secondary | ICD-10-CM | POA: Diagnosis not present

## 2020-07-08 DIAGNOSIS — S161XXD Strain of muscle, fascia and tendon at neck level, subsequent encounter: Secondary | ICD-10-CM | POA: Diagnosis not present

## 2020-07-13 ENCOUNTER — Ambulatory Visit (INDEPENDENT_AMBULATORY_CARE_PROVIDER_SITE_OTHER): Payer: Medicare PPO | Admitting: Otolaryngology

## 2020-07-13 ENCOUNTER — Other Ambulatory Visit: Payer: Self-pay

## 2020-07-13 ENCOUNTER — Encounter (INDEPENDENT_AMBULATORY_CARE_PROVIDER_SITE_OTHER): Payer: Self-pay | Admitting: Otolaryngology

## 2020-07-13 VITALS — Temp 97.5°F

## 2020-07-13 DIAGNOSIS — H6123 Impacted cerumen, bilateral: Secondary | ICD-10-CM

## 2020-07-13 NOTE — Progress Notes (Signed)
HPI: Terry [name] is a 74 y.o. male who presents for evaluation of wax buildup in his ears.  He recently moved here from [place].  He used to get his ears cleaned regularly every 6 months but has not had them cleaned since April.  He had previous left ear stapes surgery that went well performed in 1985.Marland Kitchen  Past Medical History:  Diagnosis Date  . Carotid artery stenosis 04/29/2018  . Chronic tension headaches 04/29/2018  . Essential hypertension 04/29/2018  . GERD (gastroesophageal reflux disease) 04/29/2018  . HLD (hyperlipidemia) 04/29/2018  . Parkinson's disease (HCC) 04/29/2018   Past Surgical History:  Procedure Laterality Date  . LEFT HEART CATH AND CORONARY ANGIOGRAPHY N/A 11/25/2019   Procedure: LEFT HEART CATH AND CORONARY ANGIOGRAPHY;  Surgeon: Lyn Records, MD;  Location: [facility];  Service: Cardiovascular;  Laterality: N/A;  . TONSILLECTOMY     Social History   Socioeconomic History  . Marital status: Widowed    Spouse name: Not on file  . Number of children: Not on file  . Years of education: Not on file  . Highest education level: Not on file  Occupational History  . Not on file  Tobacco Use  . Smoking status: Never Smoker  . Smokeless tobacco: Current User    Types: Snuff  Substance and Sexual Activity  . Alcohol use: Yes    Comment: Occasionally.  . Drug use: Never  . Sexual activity: Not on file  Other Topics Concern  . Not on file  Social History Narrative  . Not on file   Social Determinants of Health   Financial Resource Strain: Not on file  Food Insecurity: Not on file  Transportation Needs: Not on file  Physical Activity: Not on file  Stress: Not on file  Social Connections: Not on file   Family History  Problem Relation Age of Onset  . Heart disease Mother   . Cancer Father    Allergies  Allergen Reactions  . Penicillins Rash  . Erythromycin Rash   Prior to Admission medications   Medication Sig Start Date End Date Taking?  Authorizing Provider  amLODipine (NORVASC) 10 MG tablet Take 1 tablet (10 mg total) by mouth daily. 11/18/19 02/16/20  O'NealRonnald Ramp, MD  Ascorbic Acid (VITAMIN C) 1000 MG tablet Take 1,000 mg by mouth daily.    [provider]  aspirin EC 81 MG tablet Take 81 mg by mouth daily.    [provider]  atorvastatin (LIPITOR) 20 MG tablet TAKE ONE TABLET BY MOUTH EVERY NIGHT AT BEDTIME 05/19/20   Bloomfield, Carley D, DO  betamethasone acetate-betamethasone sodium phosphate (CELESTONE) 6 (3-3) MG/ML injection 3 1/3 cc Celestone Soluspan 6mg /ml 11/11/19   [provider]  carbidopa-levodopa (SINEMET CR) 50-200 MG tablet Take 1 tablet by mouth at bedtime. 07/29/18   09/27/18, MD  carbidopa-levodopa (SINEMET IR) 25-100 MG tablet Take 2-2.5 tablets by mouth See admin instructions. 2.5 tablets 1st thing in the morning, 2 tablets 4 times daily    [provider]  cetirizine (ZYRTEC) 10 MG tablet Take 10 mg by mouth as needed for allergies.     [provider]  fluticasone (FLONASE) 50 MCG/ACT nasal spray Place 1 spray into both nostrils daily as needed for allergies.     [provider]  meloxicam (MOBIC) 15 MG tablet Take 15 mg by mouth daily as needed for pain.  11/03/19   [provider]  Multiple Vitamins-Minerals (MULTIVITAMIN WITH MINERALS) tablet  Take 1 tablet by mouth daily.    [provider]  naproxen sodium (ALEVE) 220 MG tablet Take 220 mg by mouth 2 (two) times daily as needed (pain).     [provider]  NEUPRO 6 MG/24HR Place 1 patch onto the skin daily.  04/11/18   [provider]  nitroGLYCERIN (NITROSTAT) 0.4 MG SL tablet Place 1 tablet (0.4 mg total) under the tongue every 5 (five) minutes as needed for chest pain. 10/16/19 01/14/20  O'NealRonnald Ramp, MD  Olopatadine HCl (PATADAY) 0.2 % SOLN Apply 1 drop to eye daily as needed (allergies).    [provider]  omeprazole (PRILOSEC)  40 MG capsule Take 1 capsule (40 mg total) by mouth daily. 04/13/20   Elige Radon, MD  Tadalafil (CIALIS) 2.5 MG TABS Take 1 tablet (2.5 mg total) by mouth daily. 07/16/19 07/15/20  Lanelle Bal, MD  traZODone (DESYREL) 50 MG tablet Take 1 tablet (50 mg total) by mouth at bedtime as needed for sleep. 09/03/18   Lanelle Bal, MD  valsartan (DIOVAN) 160 MG tablet Take 1 tablet (160 mg total) by mouth daily. 11/18/19   Kroeger, Ovidio Kin., PA-C     Positive ROS: Otherwise negative  All other systems have been reviewed and were otherwise negative with the exception of those mentioned in the HPI and as above.  Physical Exam: Constitutional: Alert, well-appearing, no acute distress Ears: External ears without lesions or tenderness. Ear canals left ear canal is little bit smaller than the right ear canal.  Both ear canals were cleaned with suction.  Left ear canal had a lot more cerumen in the right ear canal.  TMs were clear bilaterally.. Nasal: External nose without lesions. Clear nasal passages Oral: Oropharynx clear. Neck: No palpable adenopathy or masses Respiratory: Breathing comfortably  Skin: No facial/neck lesions or rash noted.  Cerumen impaction removal  Date/Time: 07/13/2020 5:04 PM Performed by: Drema Halon, MD Authorized by: Drema Halon, MD   Consent:    Consent obtained:  Verbal   Consent given by:  Patient   Risks discussed:  Pain and bleeding Procedure details:    Location:  L ear and R ear   Procedure type: curette and suction   Post-procedure details:    Inspection:  TM intact and canal normal   Hearing quality:  Improved   Patient tolerance of procedure:  Tolerated well, no immediate complications Comments:     TMs are clear bilaterally.  Patient is status post left stapes surgery.    Assessment: Cerumen buildup left side worse than right.  Plan: This was cleaned in the office using suction and hydroperoxide.  He will  follow-up in 6 months for recheck and cleaning.  Narda Bonds, MD

## 2020-07-20 DIAGNOSIS — M5030 Other cervical disc degeneration, unspecified cervical region: Secondary | ICD-10-CM | POA: Diagnosis not present

## 2020-07-20 DIAGNOSIS — S161XXD Strain of muscle, fascia and tendon at neck level, subsequent encounter: Secondary | ICD-10-CM | POA: Diagnosis not present

## 2020-07-31 DIAGNOSIS — S80212A Abrasion, left knee, initial encounter: Secondary | ICD-10-CM | POA: Diagnosis not present

## 2020-07-31 DIAGNOSIS — S82001A Unspecified fracture of right patella, initial encounter for closed fracture: Secondary | ICD-10-CM | POA: Diagnosis not present

## 2020-07-31 DIAGNOSIS — S82021A Displaced longitudinal fracture of right patella, initial encounter for closed fracture: Secondary | ICD-10-CM | POA: Diagnosis not present

## 2020-07-31 DIAGNOSIS — S8992XA Unspecified injury of left lower leg, initial encounter: Secondary | ICD-10-CM | POA: Diagnosis not present

## 2020-08-01 DIAGNOSIS — S82021A Displaced longitudinal fracture of right patella, initial encounter for closed fracture: Secondary | ICD-10-CM | POA: Diagnosis not present

## 2020-08-01 DIAGNOSIS — S82001A Unspecified fracture of right patella, initial encounter for closed fracture: Secondary | ICD-10-CM | POA: Diagnosis not present

## 2020-08-01 DIAGNOSIS — S8992XA Unspecified injury of left lower leg, initial encounter: Secondary | ICD-10-CM | POA: Diagnosis not present

## 2020-08-05 DIAGNOSIS — S82021A Displaced longitudinal fracture of right patella, initial encounter for closed fracture: Secondary | ICD-10-CM | POA: Diagnosis not present

## 2020-08-17 DIAGNOSIS — S82021A Displaced longitudinal fracture of right patella, initial encounter for closed fracture: Secondary | ICD-10-CM | POA: Diagnosis not present

## 2020-08-31 DIAGNOSIS — S82021A Displaced longitudinal fracture of right patella, initial encounter for closed fracture: Secondary | ICD-10-CM | POA: Diagnosis not present

## 2020-09-07 DIAGNOSIS — G20 Parkinson's disease: Secondary | ICD-10-CM | POA: Diagnosis not present

## 2020-09-14 DIAGNOSIS — M170 Bilateral primary osteoarthritis of knee: Secondary | ICD-10-CM | POA: Diagnosis not present

## 2020-09-21 DIAGNOSIS — M170 Bilateral primary osteoarthritis of knee: Secondary | ICD-10-CM | POA: Diagnosis not present

## 2020-09-22 ENCOUNTER — Other Ambulatory Visit: Payer: Self-pay | Admitting: Internal Medicine

## 2020-09-23 NOTE — Telephone Encounter (Signed)
Attempted to contact the patient for an appointment, no answer.  Left detailed message asking patient to give [place] a call back to get an appointment scheduled.

## 2020-09-27 DIAGNOSIS — E782 Mixed hyperlipidemia: Secondary | ICD-10-CM | POA: Diagnosis not present

## 2020-09-27 DIAGNOSIS — R196 Halitosis: Secondary | ICD-10-CM | POA: Diagnosis not present

## 2020-09-27 DIAGNOSIS — I10 Essential (primary) hypertension: Secondary | ICD-10-CM | POA: Diagnosis not present

## 2020-09-27 DIAGNOSIS — G903 Multi-system degeneration of the autonomic nervous system: Secondary | ICD-10-CM | POA: Diagnosis not present

## 2020-09-28 DIAGNOSIS — M170 Bilateral primary osteoarthritis of knee: Secondary | ICD-10-CM | POA: Diagnosis not present

## 2020-09-30 DIAGNOSIS — F4320 Adjustment disorder, unspecified: Secondary | ICD-10-CM | POA: Diagnosis not present

## 2020-11-04 DIAGNOSIS — M170 Bilateral primary osteoarthritis of knee: Secondary | ICD-10-CM | POA: Diagnosis not present

## 2020-11-04 DIAGNOSIS — I10 Essential (primary) hypertension: Secondary | ICD-10-CM | POA: Diagnosis not present

## 2020-11-04 DIAGNOSIS — E782 Mixed hyperlipidemia: Secondary | ICD-10-CM | POA: Diagnosis not present

## 2020-11-04 DIAGNOSIS — I6529 Occlusion and stenosis of unspecified carotid artery: Secondary | ICD-10-CM | POA: Diagnosis not present

## 2020-11-12 DIAGNOSIS — B079 Viral wart, unspecified: Secondary | ICD-10-CM | POA: Diagnosis not present

## 2020-11-12 DIAGNOSIS — G20 Parkinson's disease: Secondary | ICD-10-CM | POA: Diagnosis not present

## 2020-11-12 DIAGNOSIS — E782 Mixed hyperlipidemia: Secondary | ICD-10-CM | POA: Diagnosis not present

## 2020-11-12 DIAGNOSIS — G903 Multi-system degeneration of the autonomic nervous system: Secondary | ICD-10-CM | POA: Diagnosis not present

## 2020-11-12 DIAGNOSIS — I6529 Occlusion and stenosis of unspecified carotid artery: Secondary | ICD-10-CM | POA: Diagnosis not present

## 2020-11-18 DIAGNOSIS — M6281 Muscle weakness (generalized): Secondary | ICD-10-CM | POA: Diagnosis not present

## 2020-11-18 DIAGNOSIS — S39012D Strain of muscle, fascia and tendon of lower back, subsequent encounter: Secondary | ICD-10-CM | POA: Diagnosis not present

## 2020-11-29 DIAGNOSIS — G4700 Insomnia, unspecified: Secondary | ICD-10-CM | POA: Diagnosis not present

## 2020-11-29 DIAGNOSIS — E785 Hyperlipidemia, unspecified: Secondary | ICD-10-CM | POA: Diagnosis not present

## 2020-11-29 DIAGNOSIS — E663 Overweight: Secondary | ICD-10-CM | POA: Diagnosis not present

## 2020-11-29 DIAGNOSIS — M1990 Unspecified osteoarthritis, unspecified site: Secondary | ICD-10-CM | POA: Diagnosis not present

## 2020-11-29 DIAGNOSIS — N529 Male erectile dysfunction, unspecified: Secondary | ICD-10-CM | POA: Diagnosis not present

## 2020-11-29 DIAGNOSIS — K219 Gastro-esophageal reflux disease without esophagitis: Secondary | ICD-10-CM | POA: Diagnosis not present

## 2020-11-29 DIAGNOSIS — G20 Parkinson's disease: Secondary | ICD-10-CM | POA: Diagnosis not present

## 2020-11-29 DIAGNOSIS — G8929 Other chronic pain: Secondary | ICD-10-CM | POA: Diagnosis not present

## 2020-11-29 DIAGNOSIS — I10 Essential (primary) hypertension: Secondary | ICD-10-CM | POA: Diagnosis not present

## 2020-11-30 DIAGNOSIS — M6281 Muscle weakness (generalized): Secondary | ICD-10-CM | POA: Diagnosis not present

## 2020-11-30 DIAGNOSIS — S39012D Strain of muscle, fascia and tendon of lower back, subsequent encounter: Secondary | ICD-10-CM | POA: Diagnosis not present

## 2020-12-07 DIAGNOSIS — M6281 Muscle weakness (generalized): Secondary | ICD-10-CM | POA: Diagnosis not present

## 2020-12-07 DIAGNOSIS — S39012D Strain of muscle, fascia and tendon of lower back, subsequent encounter: Secondary | ICD-10-CM | POA: Diagnosis not present

## 2020-12-14 DIAGNOSIS — M6281 Muscle weakness (generalized): Secondary | ICD-10-CM | POA: Diagnosis not present

## 2020-12-14 DIAGNOSIS — S39012D Strain of muscle, fascia and tendon of lower back, subsequent encounter: Secondary | ICD-10-CM | POA: Diagnosis not present

## 2020-12-18 ENCOUNTER — Encounter: Payer: Self-pay | Admitting: *Deleted

## 2020-12-21 DIAGNOSIS — H538 Other visual disturbances: Secondary | ICD-10-CM | POA: Diagnosis not present

## 2020-12-21 DIAGNOSIS — R519 Headache, unspecified: Secondary | ICD-10-CM | POA: Diagnosis not present

## 2020-12-21 DIAGNOSIS — I10 Essential (primary) hypertension: Secondary | ICD-10-CM | POA: Diagnosis not present

## 2020-12-27 ENCOUNTER — Other Ambulatory Visit: Payer: Self-pay

## 2020-12-27 ENCOUNTER — Ambulatory Visit (INDEPENDENT_AMBULATORY_CARE_PROVIDER_SITE_OTHER): Payer: Medicare PPO | Admitting: Otolaryngology

## 2020-12-27 VITALS — Temp 97.3°F

## 2020-12-27 DIAGNOSIS — H6123 Impacted cerumen, bilateral: Secondary | ICD-10-CM

## 2020-12-27 NOTE — Progress Notes (Signed)
HPI: Terry [name] is a 75 y.o. male who presents for evaluation of wax buildup in his ears.  He follows here every 6 months to have his ears cleaned..  Past Medical History:  Diagnosis Date  . Carotid artery stenosis 04/29/2018  . Chronic tension headaches 04/29/2018  . Essential hypertension 04/29/2018  . GERD (gastroesophageal reflux disease) 04/29/2018  . HLD (hyperlipidemia) 04/29/2018  . Parkinson's disease (HCC) 04/29/2018   Past Surgical History:  Procedure Laterality Date  . LEFT HEART CATH AND CORONARY ANGIOGRAPHY N/A 11/25/2019   Procedure: LEFT HEART CATH AND CORONARY ANGIOGRAPHY;  Surgeon: Lyn Records, MD;  Location: [facility];  Service: Cardiovascular;  Laterality: N/A;  . TONSILLECTOMY     Social History   Socioeconomic History  . Marital status: Widowed    Spouse name: Not on file  . Number of children: Not on file  . Years of education: Not on file  . Highest education level: Not on file  Occupational History  . Not on file  Tobacco Use  . Smoking status: Never Smoker  . Smokeless tobacco: Current User    Types: Snuff  Substance and Sexual Activity  . Alcohol use: Yes    Comment: Occasionally.  . Drug use: Never  . Sexual activity: Not on file  Other Topics Concern  . Not on file  Social History Narrative  . Not on file   Social Determinants of Health   Financial Resource Strain: Not on file  Food Insecurity: Not on file  Transportation Needs: Not on file  Physical Activity: Not on file  Stress: Not on file  Social Connections: Not on file   Family History  Problem Relation Age of Onset  . Heart disease Mother   . Cancer Father    Allergies  Allergen Reactions  . Penicillins Rash  . Erythromycin Rash   Prior to Admission medications   Medication Sig Start Date End Date Taking? Authorizing Provider  amLODipine (NORVASC) 10 MG tablet Take 1 tablet (10 mg total) by mouth daily. 11/18/19 02/16/20  O'NealRonnald Ramp, MD  Ascorbic Acid  (VITAMIN C) 1000 MG tablet Take 1,000 mg by mouth daily.    [provider]  aspirin EC 81 MG tablet Take 81 mg by mouth daily.    [provider]  atorvastatin (LIPITOR) 20 MG tablet TAKE ONE TABLET BY MOUTH EVERY NIGHT AT BEDTIME 05/19/20   Bloomfield, Carley D, DO  betamethasone acetate-betamethasone sodium phosphate (CELESTONE) 6 (3-3) MG/ML injection 3 1/3 cc Celestone Soluspan 6mg /ml 11/11/19   [provider]  carbidopa-levodopa (SINEMET CR) 50-200 MG tablet Take 1 tablet by mouth at bedtime. 07/29/18   09/27/18, MD  carbidopa-levodopa (SINEMET IR) 25-100 MG tablet Take 2-2.5 tablets by mouth See admin instructions. 2.5 tablets 1st thing in the morning, 2 tablets 4 times daily    [provider]  cetirizine (ZYRTEC) 10 MG tablet Take 10 mg by mouth as needed for allergies.     [provider]  fluticasone (FLONASE) 50 MCG/ACT nasal spray Place 1 spray into both nostrils daily as needed for allergies.     [provider]  meloxicam (MOBIC) 15 MG tablet Take 15 mg by mouth daily as needed for pain.  11/03/19   [provider]  Multiple Vitamins-Minerals (MULTIVITAMIN WITH MINERALS) tablet Take 1 tablet by mouth daily.    [provider]  naproxen sodium (ALEVE) 220 MG tablet Take 220 mg by mouth 2 (two) times daily as  needed (pain).     [provider]  NEUPRO 6 MG/24HR Place 1 patch onto the skin daily.  04/11/18   [provider]  nitroGLYCERIN (NITROSTAT) 0.4 MG SL tablet Place 1 tablet (0.4 mg total) under the tongue every 5 (five) minutes as needed for chest pain. 10/16/19 01/14/20  O'NealRonnald Ramp, MD  Olopatadine HCl (PATADAY) 0.2 % SOLN Apply 1 drop to eye daily as needed (allergies).    [provider]  omeprazole (PRILOSEC) 40 MG capsule Take 1 capsule (40 mg total) by mouth daily. 04/13/20   Elige Radon, MD  Tadalafil (CIALIS) 2.5 MG TABS Take 1 tablet (2.5 mg total) by  mouth daily. 07/16/19 07/15/20  Lanelle Bal, MD  traZODone (DESYREL) 50 MG tablet Take 1 tablet (50 mg total) by mouth at bedtime as needed for sleep. 09/03/18   Lanelle Bal, MD  valsartan (DIOVAN) 160 MG tablet Take 1 tablet (160 mg total) by mouth daily. 11/18/19   Kroeger, Ovidio Kin., PA-C     Positive ROS: Otherwise negative  All other systems have been reviewed and were otherwise negative with the exception of those mentioned in the HPI and as above.  Physical Exam: Constitutional: Alert, well-appearing, no acute distress Ears: External ears without lesions or tenderness. Ear canals with a mod amount of wax buildup in both ear canals that was cleaned with curettes and suction.  TMs were clear bilaterally.. Nasal: External nose without lesions. Clear nasal passages Oral: Oropharynx clear. Neck: No palpable adenopathy or masses Respiratory: Breathing comfortably  Skin: No facial/neck lesions or rash noted.  Cerumen impaction removal  Date/Time: 12/27/2020 3:42 PM Performed by: Drema Halon, MD Authorized by: Drema Halon, MD   Consent:    Consent obtained:  Verbal   Consent given by:  Patient   Risks discussed:  Pain and bleeding Procedure details:    Location:  L ear and R ear   Procedure type: curette and suction   Post-procedure details:    Inspection:  TM intact and canal normal   Hearing quality:  Improved   Patient tolerance of procedure:  Tolerated well, no immediate complications Comments:     TMs are clear bilaterally.    Assessment: Cerumen buildup in both ear canals.  This was cleaned in the office.  Plan: He will follow-up in 6 months for recheck and cleaning the ears.  Narda Bonds, MD

## 2021-01-06 ENCOUNTER — Ambulatory Visit (INDEPENDENT_AMBULATORY_CARE_PROVIDER_SITE_OTHER): Payer: Medicare PPO | Admitting: Otolaryngology

## 2021-01-06 DIAGNOSIS — I10 Essential (primary) hypertension: Secondary | ICD-10-CM | POA: Diagnosis not present

## 2021-01-06 DIAGNOSIS — I959 Hypotension, unspecified: Secondary | ICD-10-CM | POA: Diagnosis not present

## 2021-01-12 DIAGNOSIS — E782 Mixed hyperlipidemia: Secondary | ICD-10-CM | POA: Diagnosis not present

## 2021-01-12 DIAGNOSIS — I10 Essential (primary) hypertension: Secondary | ICD-10-CM | POA: Diagnosis not present

## 2021-01-12 DIAGNOSIS — B079 Viral wart, unspecified: Secondary | ICD-10-CM | POA: Diagnosis not present

## 2021-01-25 ENCOUNTER — Encounter: Payer: Self-pay | Admitting: *Deleted

## 2021-02-10 DIAGNOSIS — S134XXD Sprain of ligaments of cervical spine, subsequent encounter: Secondary | ICD-10-CM | POA: Diagnosis not present

## 2021-02-10 DIAGNOSIS — M542 Cervicalgia: Secondary | ICD-10-CM | POA: Diagnosis not present

## 2021-02-10 DIAGNOSIS — M170 Bilateral primary osteoarthritis of knee: Secondary | ICD-10-CM | POA: Diagnosis not present

## 2021-02-16 DIAGNOSIS — S134XXD Sprain of ligaments of cervical spine, subsequent encounter: Secondary | ICD-10-CM | POA: Diagnosis not present

## 2021-02-18 DIAGNOSIS — S134XXD Sprain of ligaments of cervical spine, subsequent encounter: Secondary | ICD-10-CM | POA: Diagnosis not present

## 2021-02-21 DIAGNOSIS — S134XXD Sprain of ligaments of cervical spine, subsequent encounter: Secondary | ICD-10-CM | POA: Diagnosis not present

## 2021-02-23 DIAGNOSIS — S134XXD Sprain of ligaments of cervical spine, subsequent encounter: Secondary | ICD-10-CM | POA: Diagnosis not present

## 2021-03-01 DIAGNOSIS — S134XXD Sprain of ligaments of cervical spine, subsequent encounter: Secondary | ICD-10-CM | POA: Diagnosis not present

## 2021-03-04 DIAGNOSIS — S134XXD Sprain of ligaments of cervical spine, subsequent encounter: Secondary | ICD-10-CM | POA: Diagnosis not present

## 2021-03-07 DIAGNOSIS — S134XXD Sprain of ligaments of cervical spine, subsequent encounter: Secondary | ICD-10-CM | POA: Diagnosis not present

## 2021-03-10 DIAGNOSIS — G20 Parkinson's disease: Secondary | ICD-10-CM | POA: Diagnosis not present

## 2021-03-14 DIAGNOSIS — S134XXD Sprain of ligaments of cervical spine, subsequent encounter: Secondary | ICD-10-CM | POA: Diagnosis not present

## 2021-03-16 DIAGNOSIS — S134XXD Sprain of ligaments of cervical spine, subsequent encounter: Secondary | ICD-10-CM | POA: Diagnosis not present

## 2021-03-21 DIAGNOSIS — S134XXD Sprain of ligaments of cervical spine, subsequent encounter: Secondary | ICD-10-CM | POA: Diagnosis not present

## 2021-03-23 ENCOUNTER — Other Ambulatory Visit: Payer: Self-pay | Admitting: Orthopedic Surgery

## 2021-03-23 DIAGNOSIS — R42 Dizziness and giddiness: Secondary | ICD-10-CM | POA: Diagnosis not present

## 2021-03-23 DIAGNOSIS — I10 Essential (primary) hypertension: Secondary | ICD-10-CM | POA: Diagnosis not present

## 2021-03-23 DIAGNOSIS — E782 Mixed hyperlipidemia: Secondary | ICD-10-CM | POA: Diagnosis not present

## 2021-03-23 DIAGNOSIS — M25561 Pain in right knee: Secondary | ICD-10-CM

## 2021-03-23 DIAGNOSIS — I951 Orthostatic hypotension: Secondary | ICD-10-CM | POA: Diagnosis not present

## 2021-03-23 DIAGNOSIS — G20 Parkinson's disease: Secondary | ICD-10-CM | POA: Diagnosis not present

## 2021-03-24 DIAGNOSIS — B078 Other viral warts: Secondary | ICD-10-CM | POA: Diagnosis not present

## 2021-03-24 DIAGNOSIS — E782 Mixed hyperlipidemia: Secondary | ICD-10-CM | POA: Diagnosis not present

## 2021-03-24 DIAGNOSIS — I10 Essential (primary) hypertension: Secondary | ICD-10-CM | POA: Diagnosis not present

## 2021-03-24 DIAGNOSIS — I951 Orthostatic hypotension: Secondary | ICD-10-CM | POA: Diagnosis not present

## 2021-03-24 DIAGNOSIS — R42 Dizziness and giddiness: Secondary | ICD-10-CM | POA: Diagnosis not present

## 2021-03-25 DIAGNOSIS — S134XXD Sprain of ligaments of cervical spine, subsequent encounter: Secondary | ICD-10-CM | POA: Diagnosis not present

## 2021-03-29 DIAGNOSIS — Z79899 Other long term (current) drug therapy: Secondary | ICD-10-CM | POA: Diagnosis not present

## 2021-03-29 DIAGNOSIS — G20 Parkinson's disease: Secondary | ICD-10-CM | POA: Diagnosis not present

## 2021-03-29 DIAGNOSIS — K117 Disturbances of salivary secretion: Secondary | ICD-10-CM | POA: Diagnosis not present

## 2021-04-01 DIAGNOSIS — S134XXD Sprain of ligaments of cervical spine, subsequent encounter: Secondary | ICD-10-CM | POA: Diagnosis not present

## 2021-04-04 DIAGNOSIS — S134XXD Sprain of ligaments of cervical spine, subsequent encounter: Secondary | ICD-10-CM | POA: Diagnosis not present

## 2021-04-12 ENCOUNTER — Ambulatory Visit: Payer: Medicare PPO | Attending: Neurology | Admitting: Physical Therapy

## 2021-04-12 ENCOUNTER — Other Ambulatory Visit: Payer: Self-pay

## 2021-04-12 ENCOUNTER — Encounter: Payer: Self-pay | Admitting: Physical Therapy

## 2021-04-12 DIAGNOSIS — R2689 Other abnormalities of gait and mobility: Secondary | ICD-10-CM | POA: Diagnosis not present

## 2021-04-12 DIAGNOSIS — R2681 Unsteadiness on feet: Secondary | ICD-10-CM | POA: Diagnosis not present

## 2021-04-12 DIAGNOSIS — Z9181 History of falling: Secondary | ICD-10-CM | POA: Insufficient documentation

## 2021-04-12 DIAGNOSIS — R293 Abnormal posture: Secondary | ICD-10-CM | POA: Diagnosis not present

## 2021-04-13 ENCOUNTER — Telehealth: Payer: Self-pay | Admitting: Physical Therapy

## 2021-04-13 NOTE — Telephone Encounter (Signed)
Faxed as provider is not in Epic:  Dr. Rubin Payor, Nani Gasser was evaluated by PT at [facility] on 04/13/21.  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   If you agree, please place an order in [facility] workque in [facility] or fax the order to [phone].  Thank you, Sherlie Ban, PT, DPT 04/13/21 3:02 PM    [facility] [address] [place], [place]  [address] Phone:  [phone] Fax:  [phone]

## 2021-04-13 NOTE — Therapy (Addendum)
[facility] [address] [place], [place], [address] Phone: [phone]   Fax:  [phone]  Physical Therapy Evaluation  Patient Details  Name: Terry [name] MRN: [id number] Date of Birth: 25-Apr-1946 Referring Provider (PT): Dr. Rubin Payor   Encounter Date: 04/12/2021   PT End of Session - 04/13/21 0810     Visit Number 1    Number of Visits 17    Date for PT Re-Evaluation 07/12/21    Authorization Type [employer] - needs auth    PT Start Time 1703    PT Stop Time 1746    PT Time Calculation (min) 43 min    Equipment Utilized During Treatment Gait belt    Activity Tolerance Patient tolerated treatment well    Behavior During Therapy [facility] for tasks assessed/performed             Past Medical History:  Diagnosis Date   Carotid artery stenosis 04/29/2018   Chronic tension headaches 04/29/2018   Essential hypertension 04/29/2018   GERD (gastroesophageal reflux disease) 04/29/2018   HLD (hyperlipidemia) 04/29/2018   Parkinson's disease (HCC) 04/29/2018    Past Surgical History:  Procedure Laterality Date   LEFT HEART CATH AND CORONARY ANGIOGRAPHY N/A 11/25/2019   Procedure: LEFT HEART CATH AND CORONARY ANGIOGRAPHY;  Surgeon: Lyn Records, MD;  [facility]: [facility];  Service: Cardiovascular;  Laterality: N/A;   TONSILLECTOMY      There were no vitals filed for this visit.    Subjective Assessment - 04/12/21 1706     Subjective Has recently moved back to [place]. Has transferred his care to Dr. Rubin Payor at [facility]. Was diagnosed with PD in 2011. Reports things have changed in the past year - his voice is getting weak and tends to studder, has fallen more and reports his balance is off. Had one fall with a R patellar fracture (back in Jan 2022 and did not require surgery). Reports his R foot will drag and meet his other foot and then he will end up on the floor. Does yoga 1x a week and walking a  few times a week. Has dizziness sometimes when he is standing from a seated position - sometimes at church. Reports buttoning is difficult and tying his laces are hard. Reports feeling very stiff    Pertinent History PD (diagnosed 2011), HTN, HLD.    Limitations House hold activities    Patient Stated Goals wants to be improve his strength and coordination. wants to live a more physically challenging life.    Currently in Pain? Yes    Pain Score 8     Pain Location Shoulder   bilat neck and shoulders   Pain Orientation Right;Left    Pain Descriptors / Indicators Aching;Sore    Pain Type Chronic pain    Aggravating Factors  nothing    Pain Relieving Factors muscle relaxer    Effect of Pain on Daily Activities PT will attempt to address through exercise                [facility] PT Assessment - 04/12/21 1718       Assessment   Medical Diagnosis PD    Referring Provider (PT) Dr. Rubin Payor    Onset Date/Surgical Date 04/07/21   date of referral, diagnosed with PD in 2011   Hand Dominance Left    Prior Therapy one bout of PT prior for balance (2013)      Precautions   Precautions Fall  Balance Screen   Has the patient fallen in the past 6 months Yes    How many times? 4 or 5    Has the patient had a decrease in activity level because of a fear of falling?  Yes    Is the patient reluctant to leave their home because of a fear of falling?  No      Home Environment   Living Environment Private residence    Living Arrangements Spouse/significant other    Type of Home House    Home Access Stairs to enter    Entrance Stairs-Number of Steps 2    Entrance Stairs-Rails Left    Home Layout One level    Home Equipment Cane - single point;Grab bars - toilet;Grab bars - tub/shower    Additional Comments uses SPC when he first gets up in the morning and when going on walks      Prior Function   Level of Independence Independent with community mobility with device    Leisure writing  (notably poetry), used to buy and flip houses, woodworking/carpentry, reading      Sensation   Light Touch Impaired Detail    Additional Comments reports tingling and cramping in his L foot sometimes      Coordination   Gross Motor Movements are Fluid and Coordinated Yes      Posture/Postural Control   Posture/Postural Control Postural limitations    Postural Limitations Rounded Shoulders;Forward head      ROM / Strength   AROM / PROM / Strength Strength      Strength   Strength Assessment Site Hip;Ankle;Knee    Right/Left Hip Right;Left    Right Hip Flexion 4+/5    Left Hip Flexion 4+/5    Right/Left Knee Right;Left    Right Knee Flexion 5/5    Right Knee Extension 5/5    Left Knee Flexion 5/5    Left Knee Extension 5/5    Right/Left Ankle Right;Left    Right Ankle Dorsiflexion 5/5    Left Ankle Dorsiflexion 5/5      Transfers   Transfers Stand to Sit;Sit to Stand    Sit to Stand 5: Supervision    Five time sit to stand comments  15.72 seconds without UE support    Stand to Sit 5: Supervision      Ambulation/Gait   Ambulation/Gait Yes    Ambulation/Gait Assistance 5: Supervision    Ambulation/Gait Assistance Details more narrow BOS noted when pt increases speed    Assistive device None    Gait Pattern Step-through pattern;Decreased arm swing - right;Decreased arm swing - left;Decreased stride length;Decreased dorsiflexion - right;Decreased dorsiflexion - left;Scissoring;Poor foot clearance - left;Poor foot clearance - right;Trunk flexed;Narrow base of support    Ambulation Surface Level;Indoor    Gait velocity 10.22 seconds = 3.21 ft/sec      Standardized Balance Assessment   Standardized Balance Assessment Timed Up and Go Test;Mini-BESTest      Mini-BESTest   Sit To Stand Normal: Comes to stand without use of hands and stabilizes independently.    Rise to Toes Normal: Stable for 3 s with maximum height.    Stand on one leg (left) Moderate: < 20 s   3.33, 5.32    Stand on one leg (right) Moderate: < 20 s   6.38, 2   Stand on one leg - lowest score 1    Compensatory Stepping Correction - Forward Normal: Recovers independently with a single, large step (second realignement is  allowed).    Compensatory Stepping Correction - Backward Moderate: More than one step is required to recover equilibrium   3 steps   Compensatory Stepping Correction - Left Lateral Normal: Recovers independently with 1 step (crossover or lateral OK)    Compensatory Stepping Correction - Right Lateral Moderate: Several steps to recover equilibrium    Stepping Corredtion Lateral - lowest score 1    Stance - Feet together, eyes open, firm surface  Normal: 30s    Stance - Feet together, eyes closed, foam surface  Normal: 30s    Incline - Eyes Closed Normal: Stands independently 30s and aligns with gravity    Change in Gait Speed Moderate: Unable to change walking speed or signs of imbalance    Walk with head turns - Horizontal Moderate: performs head turns with reduction in gait speed.    Walk with pivot turns Normal: Turns with feet close FAST (< 3 steps) with good balance.    Step over obstacles Moderate: Steps over box but touches box OR displays cautious behavior by slowing gait.    Timed UP & GO with Dual Task Moderate: Dual Task affects either counting OR walking (>10%) when compared to the TUG without Dual Task.    Mini-BEST total score 21      Timed Up and Go Test   Normal TUG (seconds) 10.34    Cognitive TUG (seconds) 18.09   starting at 77 and backwards by 3, pt with 3 correct numbers                       Objective measurements completed on examination: See above findings.                PT Education - 04/13/21 0809     Education Details clinical findings, POC, PT to send referral request for OT/ST    Person(s) Educated Patient    Methods Explanation    Comprehension Verbalized understanding              PT Short Term Goals - 04/13/21  1425       PT SHORT TERM GOAL #1   Title Pt will be independent with initial HEP for transfers, gait, balance in order to build upon functional gains made in therapy. ALL STGS DUE 05/11/21    Time 4    Period Weeks    Status New    Target Date 05/11/21      PT SHORT TERM GOAL #2   Title Pt will undergo further assessment of with LTG written for improved walking endurance.    Time 4    Period Weeks    Status New      PT SHORT TERM GOAL #3   Title Pt will perform 5x sit <> stand in 14 seconds or less without UE support for improved functional strength for transfers/decr fall risk.    Baseline 15.72 seconds    Time 4    Period Weeks    Status New      PT SHORT TERM GOAL #4   Title Pt will verbalize understanding of local Parkinson's disease resources, including options for continued community fitness.    Time 4    Period Weeks    Status New               PT Long Term Goals - 04/13/21 1428       PT LONG TERM GOAL #1   Title Pt will be independent with  final HEP for transfers, gait, balance in order to build upon functional gains made in therapy. ALL LTGS DUE 06/08/21    Time 8    Period Weeks    Status New    Target Date 06/08/21      PT LONG TERM GOAL #2   Title Pt will improve miniBEST score to at least a 24/28 in order to demo decr fall risk.    Baseline 21/28    Time 8    Period Weeks    Status New      PT LONG TERM GOAL #3   Title goal to be written in order to demo improved walking endurance for community mobility.    Time 8    Period Weeks    Status New      PT LONG TERM GOAL #4   Title Pt will recover posterior and lateral balance in push and release test in 1 robust step independently, for improved balance recovery    Baseline 3 steps posterior, 2 steps laterally to R    Time 8    Period Weeks    Status New                    Plan - 04/13/21 1433     Clinical Impression Statement Patient is a 75 year old male referred to  Neuro OPPT for PD. Pt has had worsening in his balance in the past year with a a few falls. The last time pt received PT for his balance/PD was in 2013. Pt's PMH is significant for: PD (diagnosed 2011), HTN, HLD. The following deficits were present during the exam: bradykinesia, postural abnormalities, decr timing/coordination of gait, decr functional strength, gait abnormalities, impaired balance. Based on miniBEST, cog TUG, and 5x sit <> stand, pt is an incr risk for falls. Pt would benefit from skilled PT to address these impairments and functional limitations to maximize functional mobility independence and decr fall risk.    Personal Factors and Comorbidities Comorbidity 3+;Time since onset of injury/illness/exacerbation;Past/Current Experience    Comorbidities PD (diagnosed 2011), HTN, HLD.    Examination-Activity Limitations Locomotion Level;Stand;Transfers;Stairs    Examination-Participation Restrictions Community Activity;Yard Work    Stability/Clinical Decision Making Stable/Uncomplicated    Optometrist Low    Rehab Potential Good    PT Frequency 2x / week    PT Duration 12 weeks    PT Treatment/Interventions ADLs/Self Care Home Management;Gait training;Functional mobility training;Stair training;DME Instruction;Therapeutic activities;Neuromuscular re-education;Balance training;Therapeutic exercise;Patient/family education;Passive range of motion;Energy conservation;Vestibular    PT Next Visit Plan perform , initial HEP- seated/standing PWR moves. might want to check for orthostatics. standing weight shifting/stepping strategies.    Recommended Other Services OT and ST - PT to send referral request    Consulted and Agree with Plan of Care Patient             Patient will benefit from skilled therapeutic intervention in order to improve the following deficits and impairments:  Abnormal gait, Decreased activity tolerance, Decreased balance, Decreased endurance, Decreased  strength, Difficulty walking, Impaired flexibility, Postural dysfunction, Pain, Decreased coordination  Visit Diagnosis: Unsteadiness on feet  History of falling  Abnormal posture  Other abnormalities of gait and mobility     Problem List Patient Active Problem List   Diagnosis Date Noted   Chest tightness 10/09/2019   Shortness of breath 10/09/2019   Erectile dysfunction 03/12/2019   Essential hypertension 04/29/2018   Need for immunization against influenza 04/29/2018  Parkinson's disease (HCC) 04/29/2018   Carotid artery stenosis 04/29/2018   HLD (hyperlipidemia) 04/29/2018   GERD (gastroesophageal reflux disease) 04/29/2018   Colon cancer screening 04/29/2018   Chronic tension headaches 04/29/2018    Drake Leach, PT, DPT  04/13/2021, 2:41 PM  [facility] [address] [place], [place], [address] Phone: [phone]   Fax:  [phone]  Name: Terry [name] MRN: [id number] Date of Birth: 1945/12/22

## 2021-04-13 NOTE — Addendum Note (Signed)
Addended by: Drake Leach on: 04/13/2021 02:43 PM   Modules accepted: Orders

## 2021-04-14 DIAGNOSIS — R0600 Dyspnea, unspecified: Secondary | ICD-10-CM | POA: Diagnosis not present

## 2021-04-14 DIAGNOSIS — I6522 Occlusion and stenosis of left carotid artery: Secondary | ICD-10-CM | POA: Diagnosis not present

## 2021-04-14 DIAGNOSIS — I951 Orthostatic hypotension: Secondary | ICD-10-CM | POA: Diagnosis not present

## 2021-04-19 ENCOUNTER — Encounter: Payer: Self-pay | Admitting: Physical Therapy

## 2021-04-19 ENCOUNTER — Other Ambulatory Visit: Payer: Self-pay

## 2021-04-19 ENCOUNTER — Ambulatory Visit: Payer: Medicare PPO | Admitting: Physical Therapy

## 2021-04-19 VITALS — BP 195/88 | HR 67

## 2021-04-19 DIAGNOSIS — Z9181 History of falling: Secondary | ICD-10-CM

## 2021-04-19 DIAGNOSIS — R293 Abnormal posture: Secondary | ICD-10-CM

## 2021-04-19 DIAGNOSIS — R2681 Unsteadiness on feet: Secondary | ICD-10-CM

## 2021-04-19 NOTE — Therapy (Signed)
[facility] [address] [place], [place], [address] Phone: [phone]   Fax:  [phone]  Physical Therapy Treatment-Arrived No Charge  Patient Details  Name: Terry [name] MRN: [id number] Date of Birth: 17-May-1946 Referring Provider (PT): Dr. Rubin Payor   Encounter Date: 04/19/2021   PT End of Session - 04/19/21 1702     Visit Number 2    Number of Visits 17    Date for PT Re-Evaluation 07/12/21    Authorization Type [employer] - needs auth    PT Start Time 1700    Equipment Utilized During Treatment Gait belt    Activity Tolerance Patient tolerated treatment well    Behavior During Therapy [facility] for tasks assessed/performed             Past Medical History:  Diagnosis Date   Carotid artery stenosis 04/29/2018   Chronic tension headaches 04/29/2018   Essential hypertension 04/29/2018   GERD (gastroesophageal reflux disease) 04/29/2018   HLD (hyperlipidemia) 04/29/2018   Parkinson's disease (HCC) 04/29/2018    Past Surgical History:  Procedure Laterality Date   LEFT HEART CATH AND CORONARY ANGIOGRAPHY N/A 11/25/2019   Procedure: LEFT HEART CATH AND CORONARY ANGIOGRAPHY;  Surgeon: Lyn Records, MD;  [facility]: [facility];  Service: Cardiovascular;  Laterality: N/A;   TONSILLECTOMY      Vitals:   04/19/21 1725 04/19/21 1733 04/19/21 1738 04/19/21 1743  BP: (!) 200/91 (!) 196/90 (!) 207/88 (!) 195/88  Pulse: 66 65 67      Subjective Assessment - 04/19/21 1702     Subjective Went to yoga the other night and was feeling dizzy with some of the standing balance exercises. Happens from time to time. Had one fall - fell outside while walking his dog. Got under him too quickly. Did not hurt himself and was able to get up on his own.    Pertinent History PD (diagnosed 2011), HTN, HLD.    Limitations House hold activities    Patient Stated Goals wants to be improve his strength and coordination. wants to  live a more physically challenging life.    Currently in Pain? No/denies                    Assessed vitals prior to assessing with pt's BP at 190/88 (not listed in vitals flowsheet above). Pt reports that his BP has been running high and that his PCP/neurologist are trying to figure out the right combo of BP meds (due to pt's hx of orthostatics). Pt takes his BP meds every night consistently at 10 PM. Pt reports that he is asymptomatic and reports that his providers are well aware of his high BP and will be following up with his PCP this Friday and will be bringing his BP log with him. Assessed in standing due to hx of orthostatics with BP at 180/88. No orthostatic values noted today, but did discuss pt taking his time with transitions when standing before walking. Attempted to perform 10 reps of PWR moves in seated, with BP assessed after at 200/91. Pt still asymptomatic. Provided long seated rest break with pt's BP able to decr to 195/88 with pt still asymptomatic. Discussed for pt to take his BP meds as prescribed, monitor BP at home, and if pt has any signs/sx of a CVA to call 911 immediately. PT to call pt's PCP's office tomorrow as they were closed today when PT tried to call and unable to leave  voicemail. Pt verbalized understanding of instructions.               PT Short Term Goals - 04/13/21 1425       PT SHORT TERM GOAL #1   Title Pt will be independent with initial HEP for transfers, gait, balance in order to build upon functional gains made in therapy. ALL STGS DUE 05/11/21    Time 4    Period Weeks    Status New    Target Date 05/11/21      PT SHORT TERM GOAL #2   Title Pt will undergo further assessment of with LTG written for improved walking endurance.    Time 4    Period Weeks    Status New      PT SHORT TERM GOAL #3   Title Pt will perform 5x sit <> stand in 14 seconds or less without UE support for improved functional strength for  transfers/decr fall risk.    Baseline 15.72 seconds    Time 4    Period Weeks    Status New      PT SHORT TERM GOAL #4   Title Pt will verbalize understanding of local Parkinson's disease resources, including options for continued community fitness.    Time 4    Period Weeks    Status New               PT Long Term Goals - 04/13/21 1428       PT LONG TERM GOAL #1   Title Pt will be independent with final HEP for transfers, gait, balance in order to build upon functional gains made in therapy. ALL LTGS DUE 06/08/21    Time 8    Period Weeks    Status New    Target Date 06/08/21      PT LONG TERM GOAL #2   Title Pt will improve miniBEST score to at least a 24/28 in order to demo decr fall risk.    Baseline 21/28    Time 8    Period Weeks    Status New      PT LONG TERM GOAL #3   Title goal to be written in order to demo improved walking endurance for community mobility.    Time 8    Period Weeks    Status New      PT LONG TERM GOAL #4   Title Pt will recover posterior and lateral balance in push and release test in 1 robust step independently, for improved balance recovery    Baseline 3 steps posterior, 2 steps laterally to R    Time 8    Period Weeks    Status New                    Patient will benefit from skilled therapeutic intervention in order to improve the following deficits and impairments:     Visit Diagnosis: Unsteadiness on feet  History of falling  Abnormal posture     Problem List Patient Active Problem List   Diagnosis Date Noted   Chest tightness 10/09/2019   Shortness of breath 10/09/2019   Erectile dysfunction 03/12/2019   Essential hypertension 04/29/2018   Need for immunization against influenza 04/29/2018   Parkinson's disease (HCC) 04/29/2018   Carotid artery stenosis 04/29/2018   HLD (hyperlipidemia) 04/29/2018   GERD (gastroesophageal reflux disease) 04/29/2018   Colon cancer screening 04/29/2018    Chronic tension headaches 04/29/2018  Drake Leach, PT, DPT  04/19/2021, 5:47 PM  [facility] [address] [place], [place], [address] Phone: [phone]   Fax:  [phone]  Name: Terry [name] MRN: [id number] Date of Birth: 25-Dec-1945

## 2021-04-20 ENCOUNTER — Emergency Department (HOSPITAL_COMMUNITY)
Admission: EM | Admit: 2021-04-20 | Discharge: 2021-04-21 | Disposition: A | Discharge status: Home / Self Care | Payer: Medicare PPO | Attending: Student | Admitting: Student

## 2021-04-20 ENCOUNTER — Emergency Department (HOSPITAL_COMMUNITY): Payer: Medicare PPO

## 2021-04-20 ENCOUNTER — Encounter (HOSPITAL_COMMUNITY): Payer: Self-pay | Admitting: Emergency Medicine

## 2021-04-20 ENCOUNTER — Other Ambulatory Visit: Payer: Self-pay

## 2021-04-20 DIAGNOSIS — R030 Elevated blood-pressure reading, without diagnosis of hypertension: Secondary | ICD-10-CM | POA: Diagnosis not present

## 2021-04-20 DIAGNOSIS — I10 Essential (primary) hypertension: Secondary | ICD-10-CM | POA: Diagnosis not present

## 2021-04-20 DIAGNOSIS — R42 Dizziness and giddiness: Secondary | ICD-10-CM | POA: Diagnosis not present

## 2021-04-20 DIAGNOSIS — Z5321 Procedure and treatment not carried out due to patient leaving prior to being seen by health care provider: Secondary | ICD-10-CM | POA: Insufficient documentation

## 2021-04-20 DIAGNOSIS — R110 Nausea: Secondary | ICD-10-CM | POA: Diagnosis not present

## 2021-04-20 DIAGNOSIS — R519 Headache, unspecified: Secondary | ICD-10-CM | POA: Diagnosis not present

## 2021-04-20 DIAGNOSIS — R079 Chest pain, unspecified: Secondary | ICD-10-CM | POA: Diagnosis not present

## 2021-04-20 LAB — COMPREHENSIVE METABOLIC PANEL
ALT: <5 U/L (ref 0–44)
AST: 15 U/L (ref 15–41)
Albumin: 4 g/dL (ref 3.5–5.0)
Alkaline Phosphatase: 55 U/L (ref 38–126)
Anion gap: 6 (ref 5–15)
BUN: 13 mg/dL (ref 8–23)
CO2: 30 mmol/L (ref 22–32)
Calcium: 9.1 mg/dL (ref 8.9–10.3)
Chloride: 102 mmol/L (ref 98–111)
Creatinine, Ser: 0.95 mg/dL (ref 0.61–1.24)
GFR, Estimated: >60 mL/min (ref >60)
Glucose, Bld: 110 mg/dL — ABNORMAL HIGH (ref 70–99)
Potassium: 4.5 mmol/L (ref 3.5–5.1)
Sodium: 138 mmol/L (ref 135–145)
Total Bilirubin: 1 mg/dL (ref 0.3–1.2)
Total Protein: 4.5 g/dL — ABNORMAL LOW (ref 6.5–8.1)

## 2021-04-20 LAB — CBC
HCT: 40.5 % (ref 39.0–52.0)
Hemoglobin: 13.4 g/dL (ref 13.0–17.0)
MCH: 29.6 pg (ref 26.0–34.0)
MCHC: 33.1 g/dL (ref 30.0–36.0)
MCV: 89.6 fL (ref 80.0–100.0)
Platelets: 180 10*3/uL (ref 150–400)
RBC: 4.52 MIL/uL (ref 4.22–5.81)
RDW: 13.7 % (ref 11.5–15.5)
WBC: 8.1 10*3/uL (ref 4.0–10.5)
nRBC: 0 % (ref 0.0–0.2)

## 2021-04-20 LAB — TROPONIN I (HIGH SENSITIVITY): Troponin I (High Sensitivity): 7 ng/L (ref <18)

## 2021-04-20 IMAGING — CT CT HEAD W/O CM
3 of 5 series · 14 of 47 positions shown, 16 images · non-contrast
Comparison: None.

CLINICAL DATA: Headache, hypertension

EXAM:
CT HEAD WITHOUT CONTRAST
TECHNIQUE: Contiguous axial images were obtained from the base of the skull
through the vertex without intravenous contrast.

[Series 4: head 5.0 h30s · axial · 0.46mm/px · z∈[-195,-55]mm · 8 of 34 slices shown, 10 images]
[im 3/34  brain]
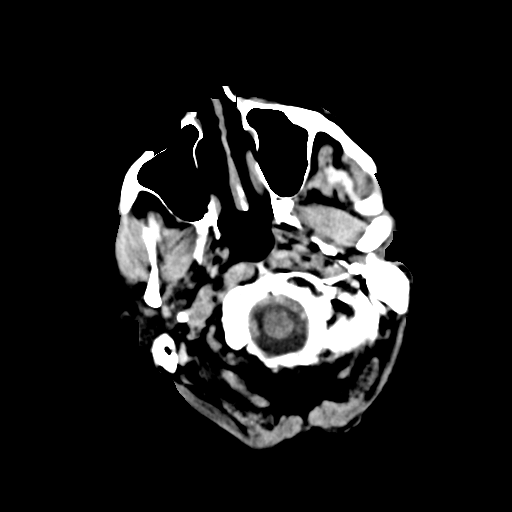
[im 3/34  bone]
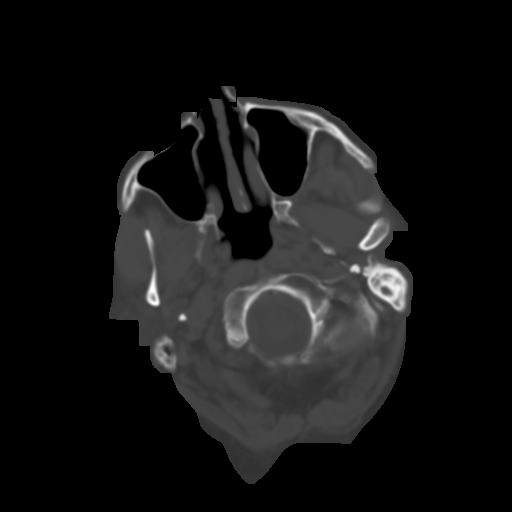
[im 8/34  brain]
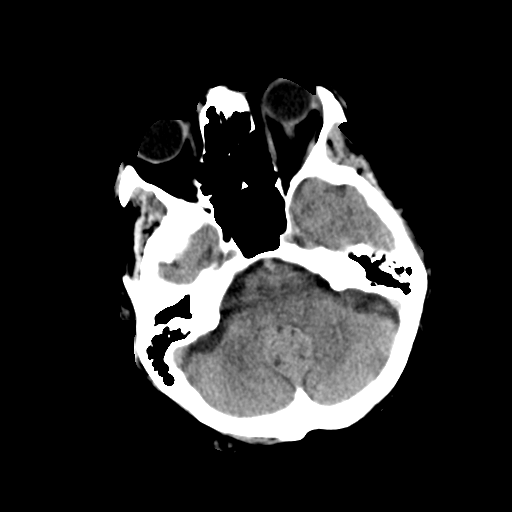
[im 12/34  brain]
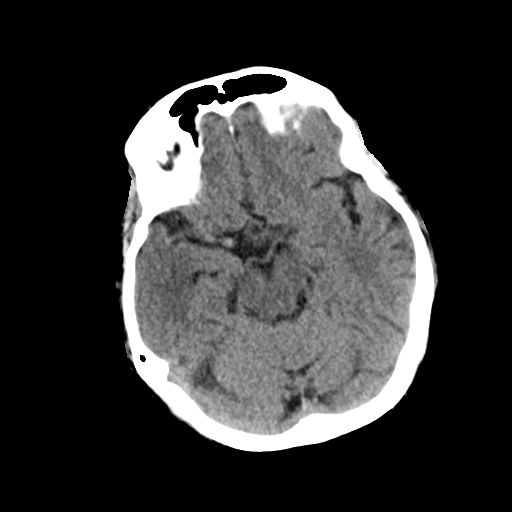
[im 15/34  brain]
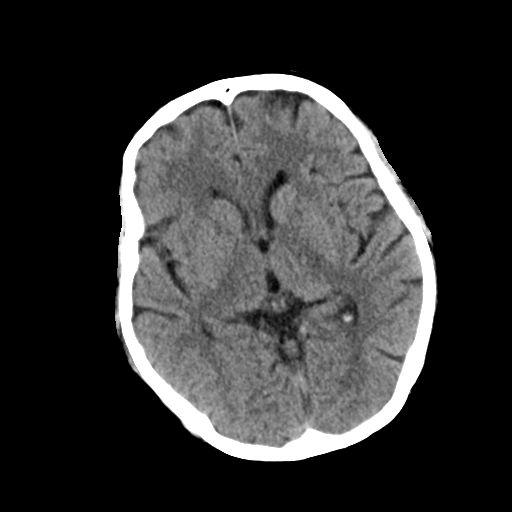
[im 19/34  brain]
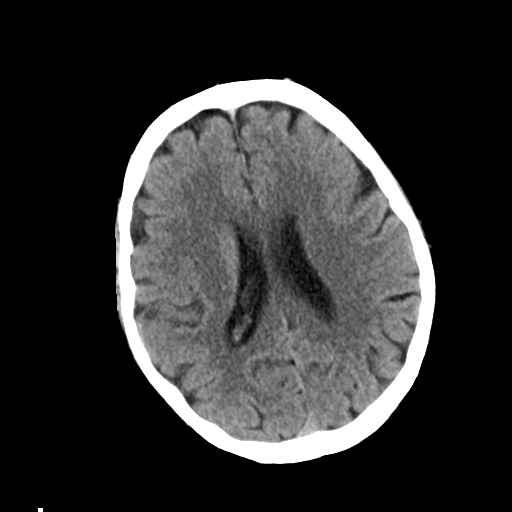
[im 19/34  bone]
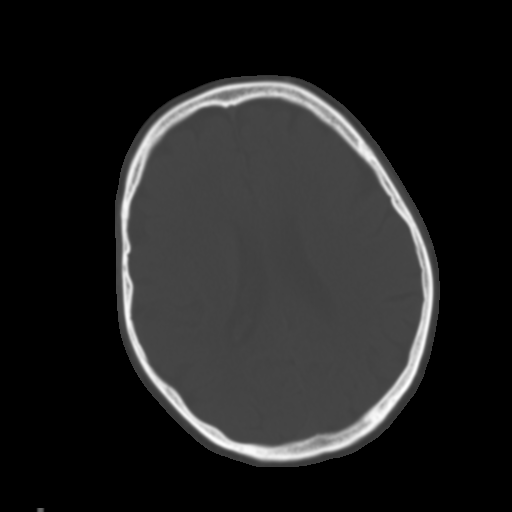
[im 22/34  brain]
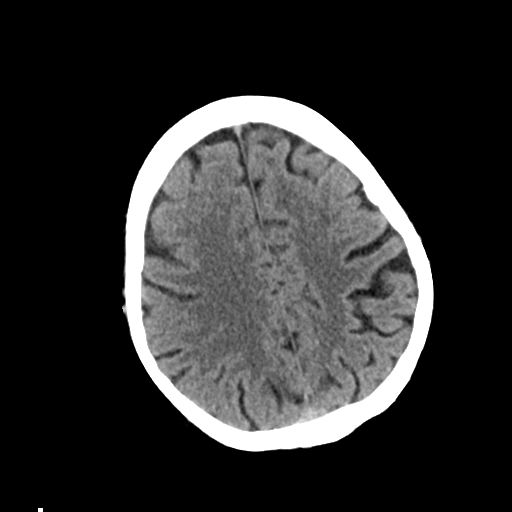
[im 26/34  brain]
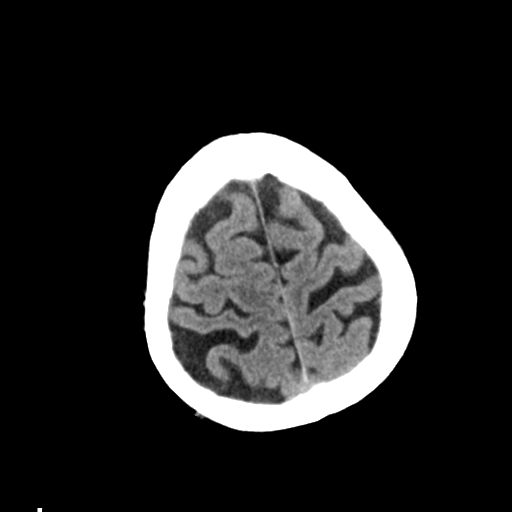
[im 31/34  brain]
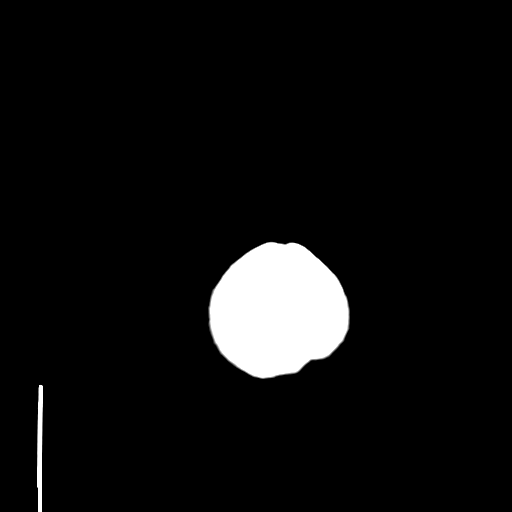

[Series 5: head 3.0 mpr cor · coronal · 0.32mm/px · 3 of 67 slices shown]
[im 14/67  brain]
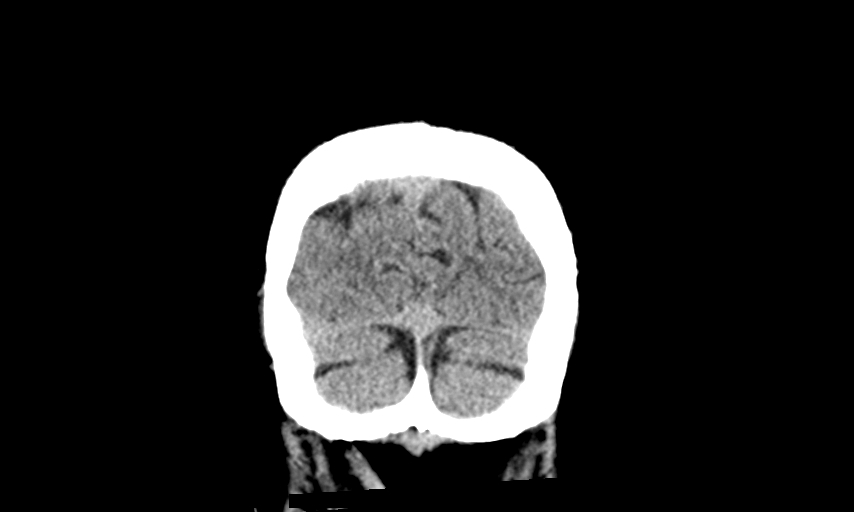
[im 27/67  brain]
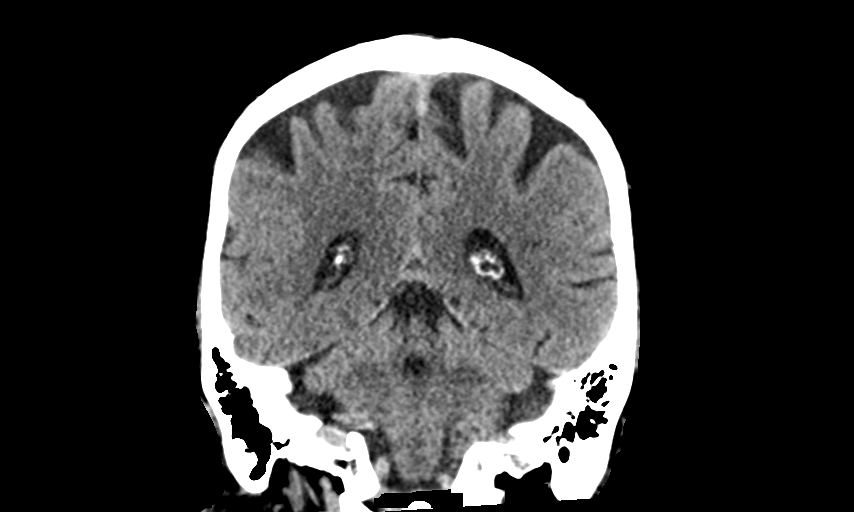
[im 40/67  brain]
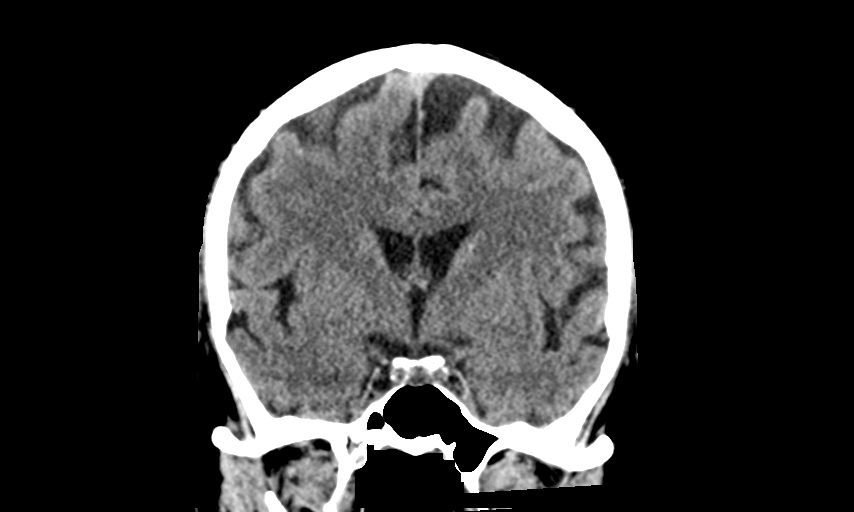

[Series 6: head 3.0 mpr sag · sagittal · 0.32mm/px · 3 of 56 slices shown]
[im 19/56  brain]
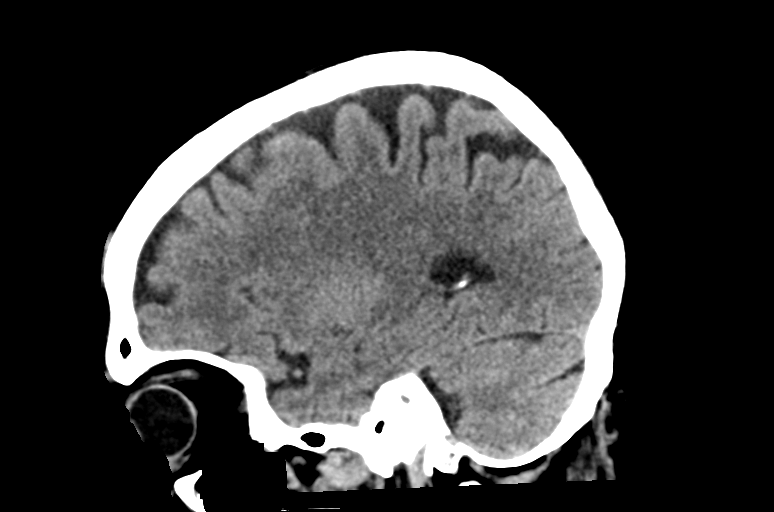
[im 28/56  brain]
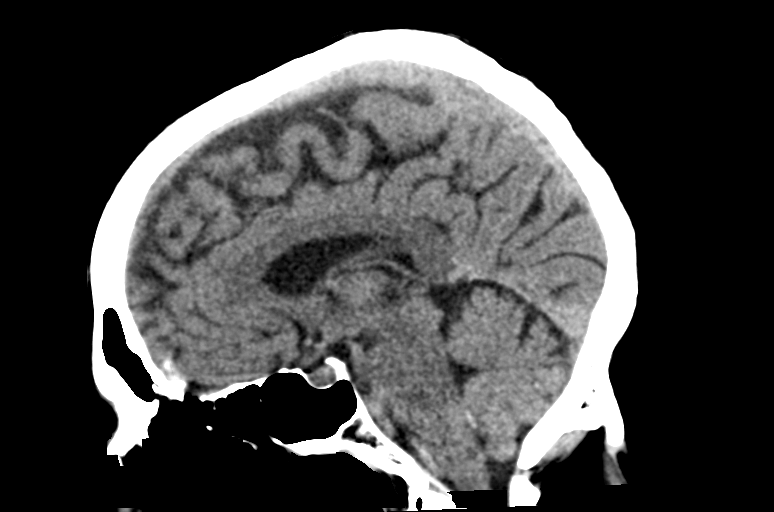
[im 37/56  brain]
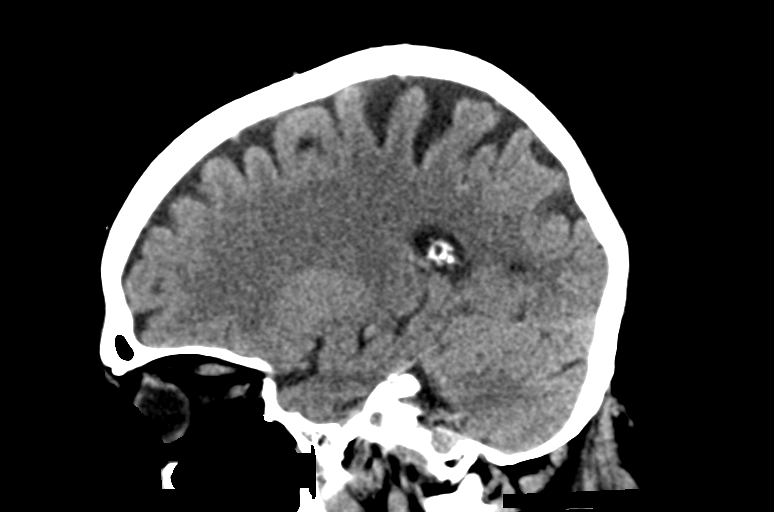

[14 of 47 positions shown; findings below may reference images not displayed]

FINDINGS: Brain: Normal anatomic configuration. Parenchymal volume loss is
commensurate with the patient's age. Mild periventricular white
matter changes are present likely reflecting the sequela of small
vessel ischemia. No abnormal intra or extra-axial mass lesion or
fluid collection. No abnormal mass effect or midline shift. No
evidence of acute intracranial hemorrhage or infarct. Ventricular
size is normal. Cerebellum unremarkable.

Vascular: No asymmetric hyperdense vasculature at the skull base.

Skull: Intact

Sinuses/Orbits: Middle turbinectomy has been performed. The
visualized paranasal sinuses are clear. Orbits are unremarkable.

Other: Mastoid air cells and middle ear cavities are clear. Remote
nasal fracture noted.
IMPRESSION: No acute intracranial abnormality.  Mild senescent change.

## 2021-04-20 IMAGING — CR DG CHEST 2V
2 series · 2 of 2 positions shown · non-contrast
Comparison: None.

CLINICAL DATA: Chest pain, hypertension

EXAM:
CHEST - 2 VIEW

[chest pa]
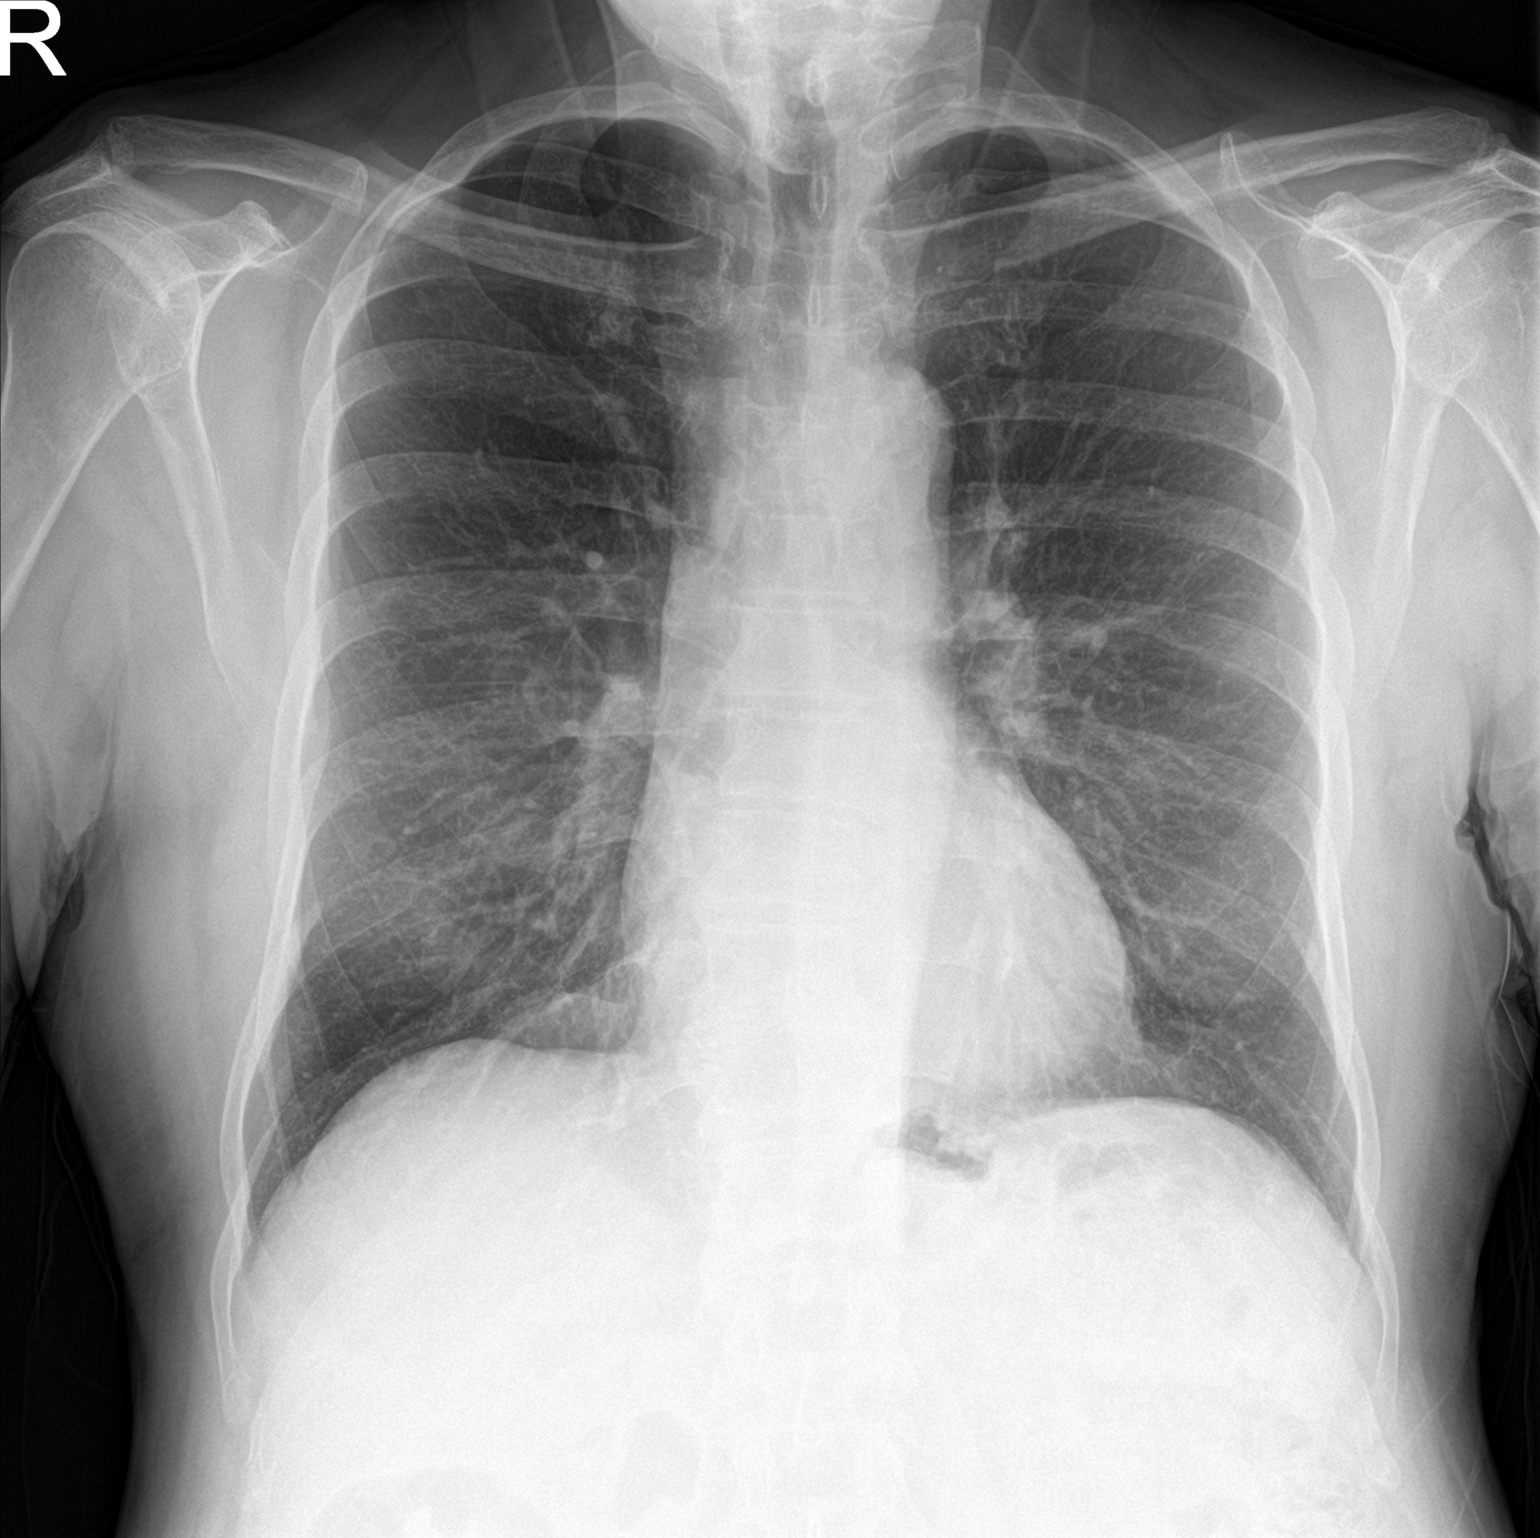

[chest lat]
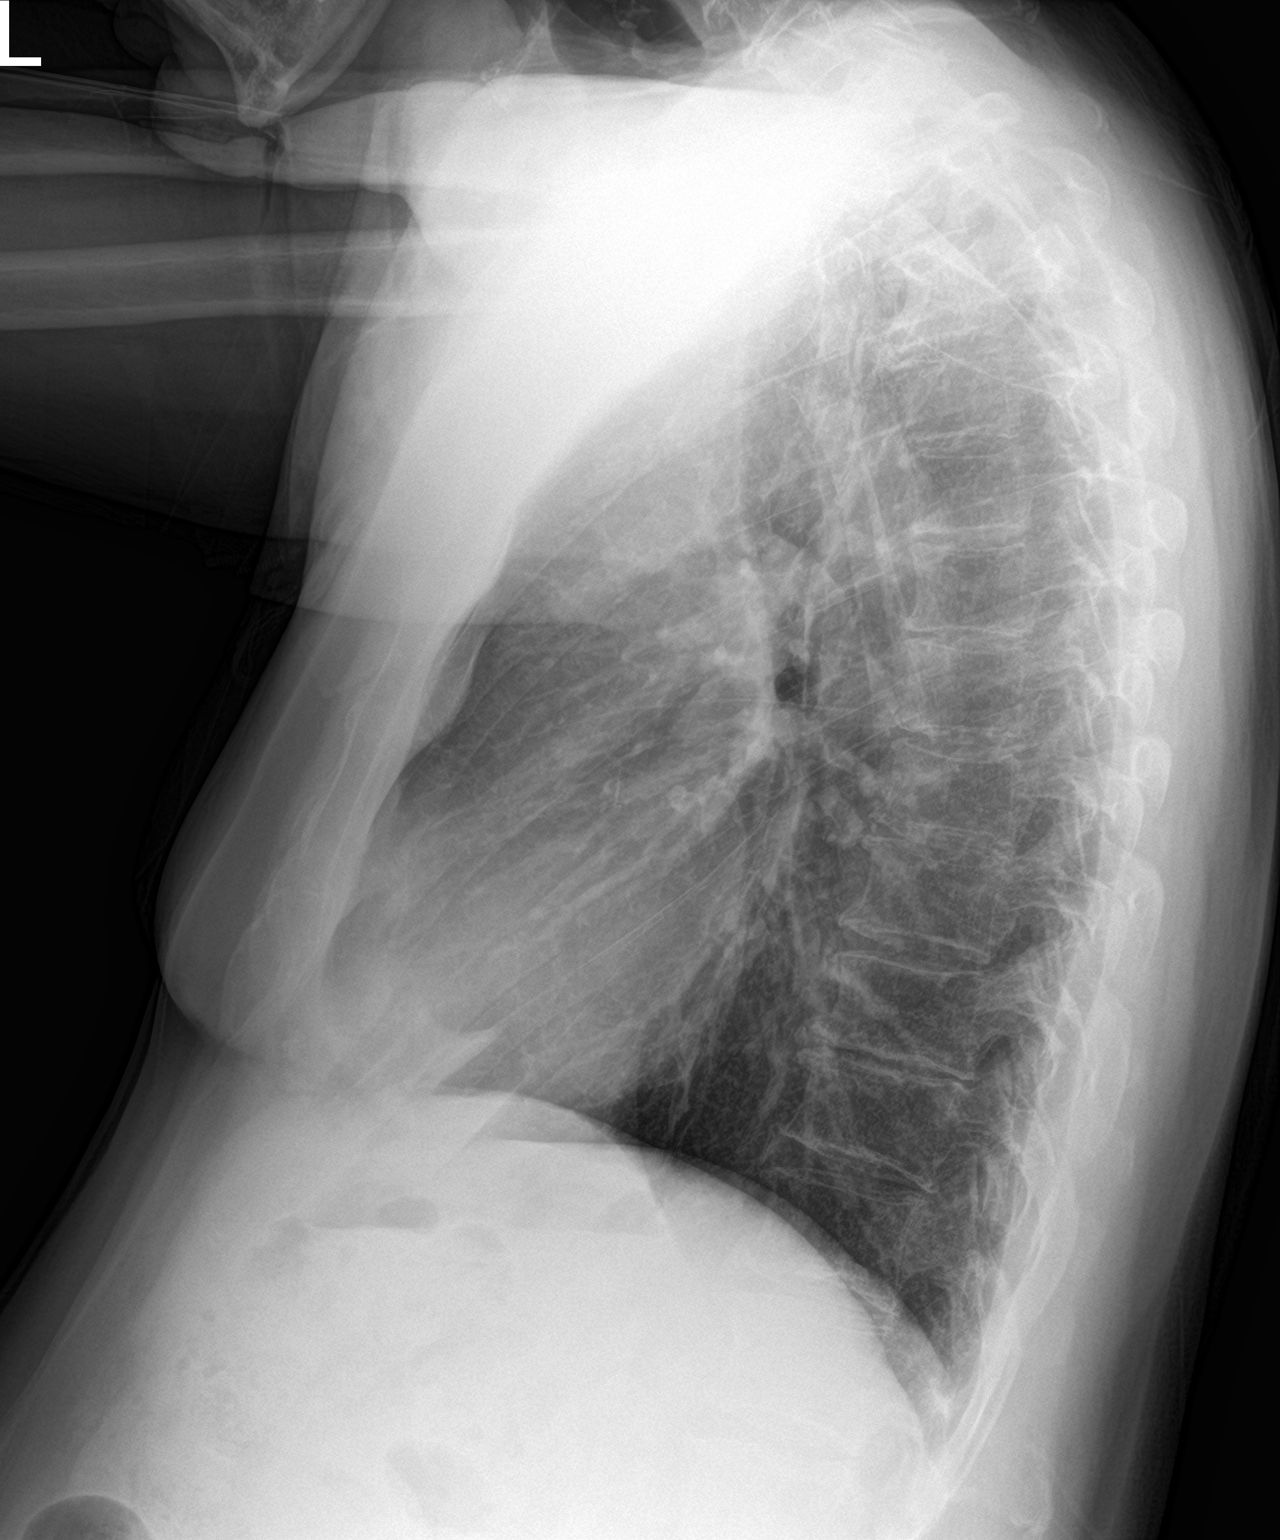

[2 of 2 positions shown; findings below may reference images not displayed]

FINDINGS: Lungs are clear.  No pleural effusion or pneumothorax.

The heart is normal in size.

Visualized osseous structures are within normal limits.
IMPRESSION: Normal chest radiographs.

## 2021-04-20 NOTE — ED Provider Notes (Signed)
Emergency Medicine Provider Triage Evaluation Note  Terry [name] , a 75 y.o. male  was evaluated in triage.  Pt complains of hypertension. Patient states he has had problems with his BP for a little while now, worse over the past 5-6 days with whooziness, headaches, & blurred vision. Tonight he has had a mild headache and earlier he had a brief episode of chest pain. He checked his blood pressure and noted it to be 207/100, given how elevated it was and his sxs he decided to come get checked out. He currently only has a mild headache, no other specific complaints at this time. No alleviating/aggravating factors. Has been taking his valsartan as prescribed.   Review of Systems  Positive: Headache currently, not at present- whooziness, blurred vision, chest pain Negative: Vomiting, syncope, numbness, weakness, speech change  Physical Exam  BP (!) 158/81 (BP Location: Right Arm)   Pulse 82   Temp 98.6 F (37 C) (Oral)   Resp 18   Ht 6' (1.829 m)   Wt 90 kg   SpO2 98%   BMI 26.91 kg/m  Gen:   Awake, no distress   Resp:  Normal effort  MSK:   Moves extremities without difficulty  Other:  PERRL. EOMI. No facial droop. Sensation grossly intact x 4. 5/5 symmetric grip strength & plantar/dorsiflexion strength bilaterally.   Medical Decision Making  Medically screening exam initiated at 10:23 PM.  Appropriate orders placed.  Havish Petties was informed that the remainder of the evaluation will be completed by another provider, this initial triage assessment does not replace that evaluation, and the importance of remaining in the ED until their evaluation is complete.  BP 158/81 currently.  Plan for EKG, labs to include cardiac enzymes with CXR give patient's episode of chest pain earlier tonight, and a head CT given his headaches and prior blurred vision reports.     Desmond Lope 04/20/21 2226    Charlynne Pander, MD 04/20/21 2239

## 2021-04-20 NOTE — ED Triage Notes (Signed)
Patient reports elevated blood pressure this evening at home BP= 207/100 , he took his antihypertensive medications , he added feeling nauseous , lightheaded/dizziness and mild headache .

## 2021-04-21 LAB — TROPONIN I (HIGH SENSITIVITY): Troponin I (High Sensitivity): 7 ng/L (ref <18)

## 2021-04-21 NOTE — ED Notes (Signed)
Patient left without being seen due to wait time.

## 2021-04-22 DIAGNOSIS — Z23 Encounter for immunization: Secondary | ICD-10-CM | POA: Diagnosis not present

## 2021-04-22 DIAGNOSIS — G20 Parkinson's disease: Secondary | ICD-10-CM | POA: Diagnosis not present

## 2021-04-22 DIAGNOSIS — I10 Essential (primary) hypertension: Secondary | ICD-10-CM | POA: Diagnosis not present

## 2021-04-22 DIAGNOSIS — I6522 Occlusion and stenosis of left carotid artery: Secondary | ICD-10-CM | POA: Diagnosis not present

## 2021-04-22 DIAGNOSIS — G903 Multi-system degeneration of the autonomic nervous system: Secondary | ICD-10-CM | POA: Diagnosis not present

## 2021-04-23 ENCOUNTER — Emergency Department (HOSPITAL_COMMUNITY): Payer: Medicare PPO

## 2021-04-23 ENCOUNTER — Encounter (HOSPITAL_COMMUNITY): Payer: Self-pay | Admitting: Internal Medicine

## 2021-04-23 ENCOUNTER — Inpatient Hospital Stay (HOSPITAL_COMMUNITY)
Admission: EM | Admit: 2021-04-23 | Discharge: 2021-04-28 | DRG: 038 | Disposition: A | Discharge status: Home / Self Care | Payer: Medicare PPO | Attending: Family Medicine | Admitting: Family Medicine

## 2021-04-23 ENCOUNTER — Other Ambulatory Visit: Payer: Self-pay

## 2021-04-23 DIAGNOSIS — I2510 Atherosclerotic heart disease of native coronary artery without angina pectoris: Secondary | ICD-10-CM | POA: Diagnosis present

## 2021-04-23 DIAGNOSIS — I6522 Occlusion and stenosis of left carotid artery: Secondary | ICD-10-CM | POA: Diagnosis not present

## 2021-04-23 DIAGNOSIS — I6389 Other cerebral infarction: Secondary | ICD-10-CM | POA: Diagnosis not present

## 2021-04-23 DIAGNOSIS — Z8673 Personal history of transient ischemic attack (TIA), and cerebral infarction without residual deficits: Secondary | ICD-10-CM | POA: Diagnosis not present

## 2021-04-23 DIAGNOSIS — Z720 Tobacco use: Secondary | ICD-10-CM

## 2021-04-23 DIAGNOSIS — Z888 Allergy status to other drugs, medicaments and biological substances status: Secondary | ICD-10-CM | POA: Diagnosis not present

## 2021-04-23 DIAGNOSIS — Z79899 Other long term (current) drug therapy: Secondary | ICD-10-CM

## 2021-04-23 DIAGNOSIS — I6523 Occlusion and stenosis of bilateral carotid arteries: Secondary | ICD-10-CM | POA: Diagnosis present

## 2021-04-23 DIAGNOSIS — Z880 Allergy status to penicillin: Secondary | ICD-10-CM | POA: Diagnosis not present

## 2021-04-23 DIAGNOSIS — I639 Cerebral infarction, unspecified: Principal | ICD-10-CM | POA: Diagnosis present

## 2021-04-23 DIAGNOSIS — G20 Parkinson's disease: Secondary | ICD-10-CM | POA: Diagnosis present

## 2021-04-23 DIAGNOSIS — E7800 Pure hypercholesterolemia, unspecified: Secondary | ICD-10-CM | POA: Diagnosis present

## 2021-04-23 DIAGNOSIS — I672 Cerebral atherosclerosis: Secondary | ICD-10-CM | POA: Diagnosis not present

## 2021-04-23 DIAGNOSIS — H538 Other visual disturbances: Secondary | ICD-10-CM | POA: Diagnosis present

## 2021-04-23 DIAGNOSIS — I169 Hypertensive crisis, unspecified: Secondary | ICD-10-CM | POA: Diagnosis present

## 2021-04-23 DIAGNOSIS — G20A1 Parkinson's disease without dyskinesia, without mention of fluctuations: Secondary | ICD-10-CM | POA: Diagnosis present

## 2021-04-23 DIAGNOSIS — R42 Dizziness and giddiness: Secondary | ICD-10-CM | POA: Diagnosis not present

## 2021-04-23 DIAGNOSIS — I6502 Occlusion and stenosis of left vertebral artery: Secondary | ICD-10-CM | POA: Diagnosis not present

## 2021-04-23 DIAGNOSIS — R29701 NIHSS score 1: Secondary | ICD-10-CM | POA: Diagnosis present

## 2021-04-23 DIAGNOSIS — I6782 Cerebral ischemia: Secondary | ICD-10-CM | POA: Diagnosis not present

## 2021-04-23 DIAGNOSIS — R4789 Other speech disturbances: Secondary | ICD-10-CM | POA: Diagnosis present

## 2021-04-23 DIAGNOSIS — R490 Dysphonia: Secondary | ICD-10-CM | POA: Diagnosis not present

## 2021-04-23 DIAGNOSIS — Z20822 Contact with and (suspected) exposure to covid-19: Secondary | ICD-10-CM | POA: Diagnosis present

## 2021-04-23 DIAGNOSIS — I63232 Cerebral infarction due to unspecified occlusion or stenosis of left carotid arteries: Secondary | ICD-10-CM | POA: Diagnosis not present

## 2021-04-23 DIAGNOSIS — M47812 Spondylosis without myelopathy or radiculopathy, cervical region: Secondary | ICD-10-CM | POA: Diagnosis not present

## 2021-04-23 DIAGNOSIS — E785 Hyperlipidemia, unspecified: Secondary | ICD-10-CM | POA: Diagnosis not present

## 2021-04-23 DIAGNOSIS — R2681 Unsteadiness on feet: Secondary | ICD-10-CM | POA: Diagnosis present

## 2021-04-23 DIAGNOSIS — K219 Gastro-esophageal reflux disease without esophagitis: Secondary | ICD-10-CM | POA: Diagnosis present

## 2021-04-23 DIAGNOSIS — I161 Hypertensive emergency: Secondary | ICD-10-CM | POA: Diagnosis present

## 2021-04-23 DIAGNOSIS — R519 Headache, unspecified: Secondary | ICD-10-CM | POA: Diagnosis present

## 2021-04-23 DIAGNOSIS — I10 Essential (primary) hypertension: Secondary | ICD-10-CM | POA: Diagnosis present

## 2021-04-23 DIAGNOSIS — G4489 Other headache syndrome: Secondary | ICD-10-CM | POA: Diagnosis not present

## 2021-04-23 DIAGNOSIS — E876 Hypokalemia: Secondary | ICD-10-CM | POA: Diagnosis present

## 2021-04-23 DIAGNOSIS — G44209 Tension-type headache, unspecified, not intractable: Secondary | ICD-10-CM | POA: Diagnosis present

## 2021-04-23 DIAGNOSIS — R110 Nausea: Secondary | ICD-10-CM | POA: Diagnosis not present

## 2021-04-23 DIAGNOSIS — R29818 Other symptoms and signs involving the nervous system: Secondary | ICD-10-CM | POA: Diagnosis not present

## 2021-04-23 DIAGNOSIS — I629 Nontraumatic intracranial hemorrhage, unspecified: Secondary | ICD-10-CM | POA: Diagnosis not present

## 2021-04-23 DIAGNOSIS — Z8249 Family history of ischemic heart disease and other diseases of the circulatory system: Secondary | ICD-10-CM

## 2021-04-23 LAB — COMPREHENSIVE METABOLIC PANEL
ALT: <5 U/L (ref 0–44)
AST: 14 U/L — ABNORMAL LOW (ref 15–41)
Albumin: 4.1 g/dL (ref 3.5–5.0)
Alkaline Phosphatase: 52 U/L (ref 38–126)
Anion gap: 9 (ref 5–15)
BUN: 18 mg/dL (ref 8–23)
CO2: 25 mmol/L (ref 22–32)
Calcium: 8.6 mg/dL — ABNORMAL LOW (ref 8.9–10.3)
Chloride: 105 mmol/L (ref 98–111)
Creatinine, Ser: 0.96 mg/dL (ref 0.61–1.24)
GFR, Estimated: >60 mL/min (ref >60)
Glucose, Bld: 101 mg/dL — ABNORMAL HIGH (ref 70–99)
Potassium: 3.9 mmol/L (ref 3.5–5.1)
Sodium: 139 mmol/L (ref 135–145)
Total Bilirubin: 0.7 mg/dL (ref 0.3–1.2)
Total Protein: 6.5 g/dL (ref 6.5–8.1)

## 2021-04-23 LAB — CBC WITH DIFFERENTIAL/PLATELET
Abs Immature Granulocytes: 0.03 10*3/uL (ref 0.00–0.07)
Basophils Absolute: 0 10*3/uL (ref 0.0–0.1)
Basophils Relative: 0 %
Eosinophils Absolute: 0.1 10*3/uL (ref 0.0–0.5)
Eosinophils Relative: 1 %
HCT: 41.2 % (ref 39.0–52.0)
Hemoglobin: 13.4 g/dL (ref 13.0–17.0)
Immature Granulocytes: 0 %
Lymphocytes Relative: 28 %
Lymphs Abs: 2.4 10*3/uL (ref 0.7–4.0)
MCH: 29.4 pg (ref 26.0–34.0)
MCHC: 32.5 g/dL (ref 30.0–36.0)
MCV: 90.4 fL (ref 80.0–100.0)
Monocytes Absolute: 0.7 10*3/uL (ref 0.1–1.0)
Monocytes Relative: 8 %
Neutro Abs: 5.3 10*3/uL (ref 1.7–7.7)
Neutrophils Relative %: 63 %
Platelets: 182 10*3/uL (ref 150–400)
RBC: 4.56 MIL/uL (ref 4.22–5.81)
RDW: 13.8 % (ref 11.5–15.5)
WBC: 8.5 10*3/uL (ref 4.0–10.5)
nRBC: 0 % (ref 0.0–0.2)

## 2021-04-23 LAB — URINALYSIS, ROUTINE W REFLEX MICROSCOPIC
Bacteria, UA: NONE SEEN
Bilirubin Urine: NEGATIVE
Glucose, UA: NEGATIVE mg/dL
Ketones, ur: 5 mg/dL — AB
Leukocytes,Ua: NEGATIVE
Nitrite: NEGATIVE
Protein, ur: NEGATIVE mg/dL
Specific Gravity, Urine: 1.015 (ref 1.005–1.030)
pH: 7 (ref 5.0–8.0)

## 2021-04-23 LAB — TROPONIN I (HIGH SENSITIVITY): Troponin I (High Sensitivity): 6 ng/L (ref <18)

## 2021-04-23 IMAGING — MR MR HEAD W/O CM
12 of 17 series · 34 of 48 positions shown · non-contrast
Comparison: Head CT from earlier the same day.

CLINICAL DATA: Initial evaluation for acute dizziness.

EXAM:
MRI HEAD WITHOUT CONTRAST
TECHNIQUE: Multiplanar, multiecho pulse sequences of the brain and surrounding
structures were obtained without intravenous contrast.

[Series 5: DWI · axial · 3.0mm · 0.88mm/px · z∈[-47,+99]mm · 5 of 100 slices shown (1 of 4)]
[im 1/100]
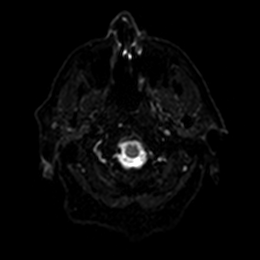
[im 25/100]
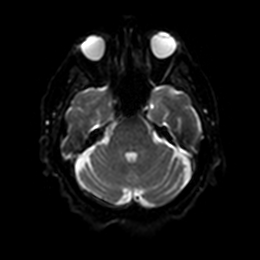
[im 50/100]
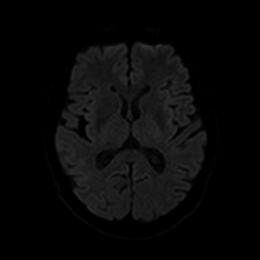
[im 75/100]
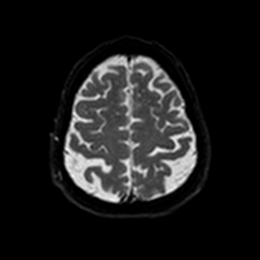
[im 100/100]
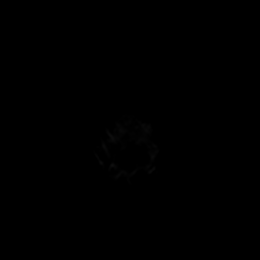

[Series 6: DWI · axial · 3.0mm · 0.88mm/px · z∈[-47,+99]mm · 2 of 49 slices shown (2 of 4)]
[im 1/49]
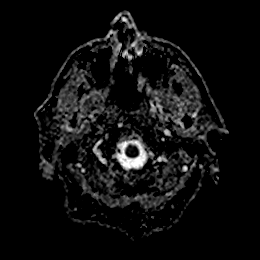
[im 49/49]
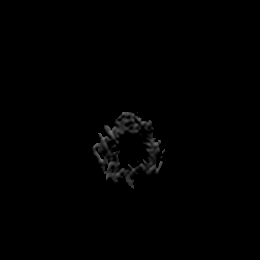

[Series 7: DWI · coronal · 4.0mm · 0.88mm/px · 3 of 68 slices shown (3 of 4)]
[im 1/68]
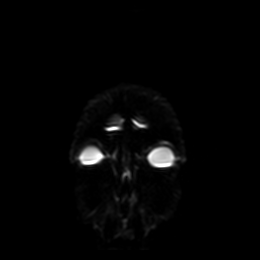
[im 34/68]
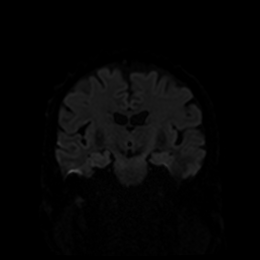
[im 68/68]
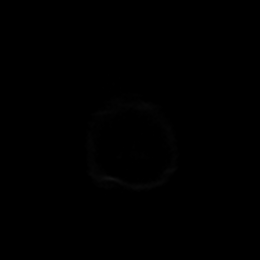

[Series 8: DWI · coronal · 4.0mm · 0.88mm/px · 2 of 34 slices shown (4 of 4)]
[im 1/34]
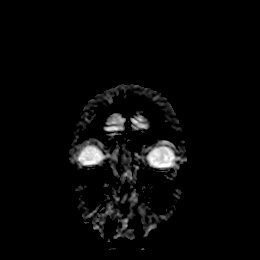
[im 34/34]
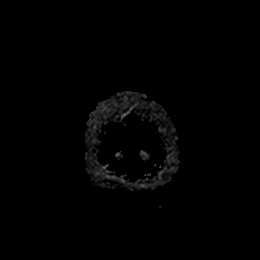

[Series 9: T1 · sagittal · 5.0mm · 0.75mm/px · 2 of 26 slices shown]
[im 1/26]
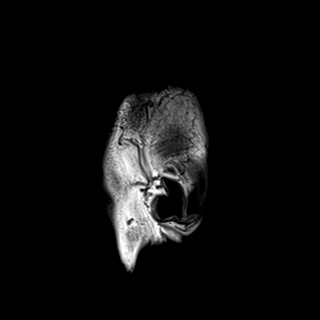
[im 26/26]
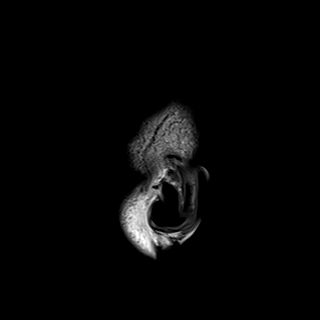

[Series 10: T2 · axial · 5.0mm · 0.72mm/px · z∈[-60,+102]mm · 2 of 28 slices shown (1 of 2)]
[im 1/28]
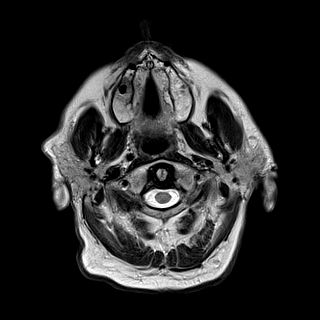
[im 28/28]
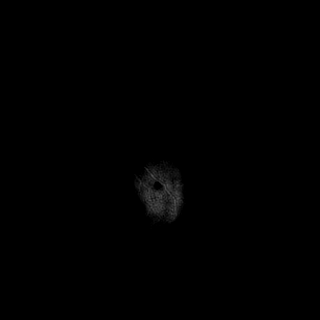

[Series 11: FLAIR · axial · 5.0mm · 0.45mm/px · z∈[-59,+103]mm · 2 of 28 slices shown]
[im 1/28]
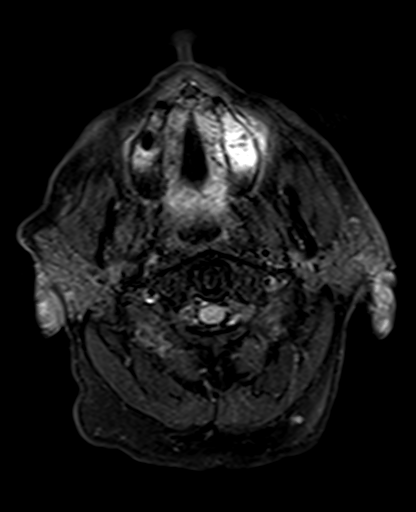
[im 28/28]
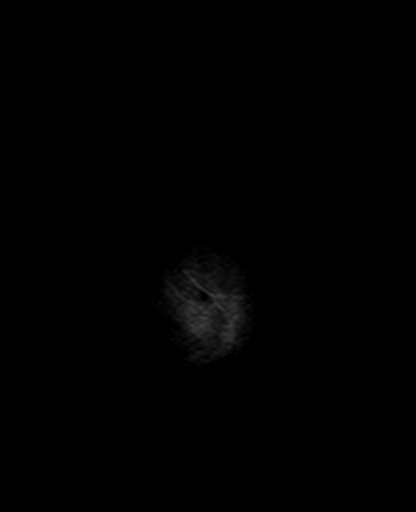

[Series 12: mag_images · axial · 3.0mm · 0.90mm/px · z∈[-67,+110]mm · 4 of 60 slices shown]
[im 1/60]
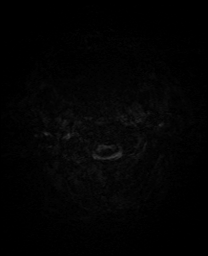
[im 20/60]
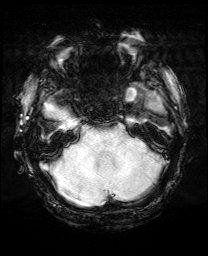
[im 40/60]
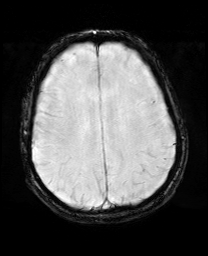
[im 60/60]
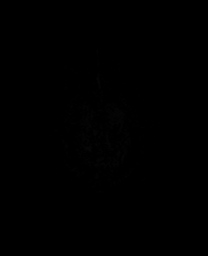

[Series 13: pha_images · axial · 3.0mm · 0.90mm/px · z∈[-64,+107]mm · 3 of 57 slices shown]
[im 1/57]
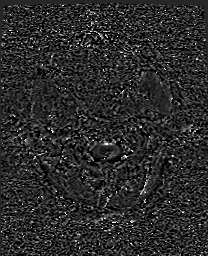
[im 29/57]
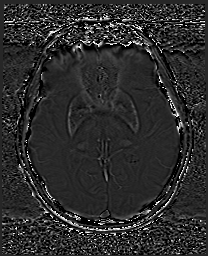
[im 57/57]
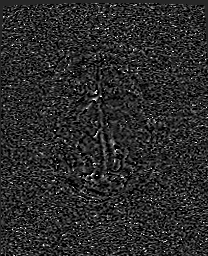

[Series 14: swi_images · axial · 3.0mm · 0.90mm/px · z∈[-67,+110]mm · 4 of 60 slices shown]
[im 1/60]
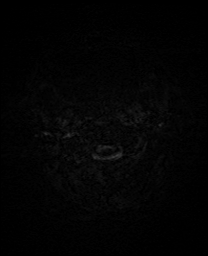
[im 20/60]
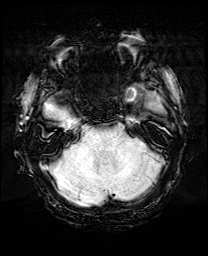
[im 40/60]
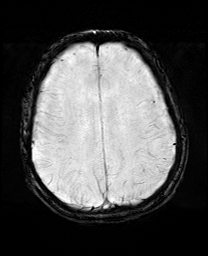
[im 60/60]
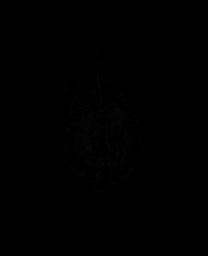

[Series 15: mip_images(sw) · axial · 24.0mm · 0.90mm/px · z∈[-57,+99]mm · 3 of 53 slices shown]
[im 1/53]
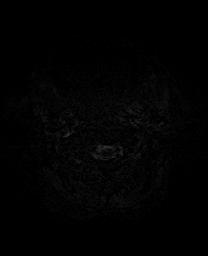
[im 27/53]
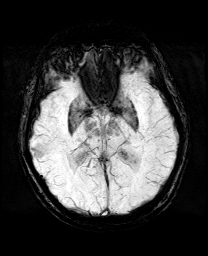
[im 53/53]
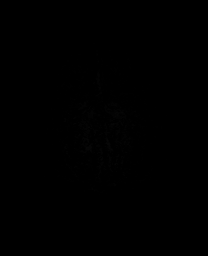

[Series 17: T2 · coronal · 5.0mm · 0.34mm/px · 2 of 31 slices shown (2 of 2)]
[im 1/31]
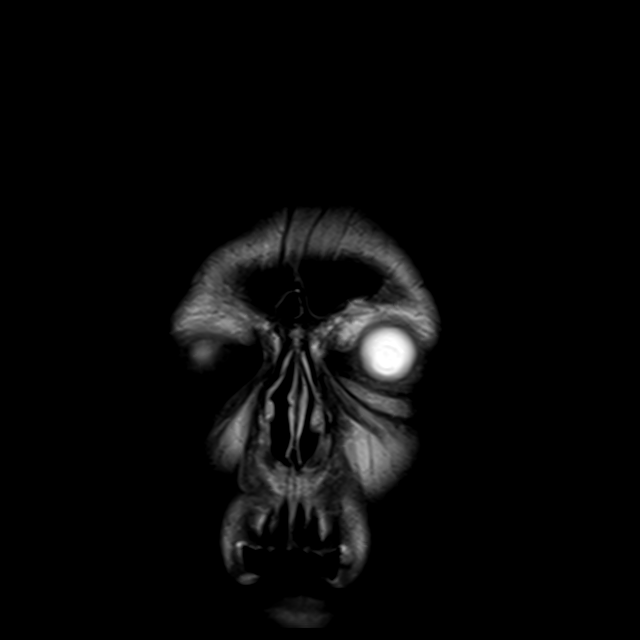
[im 31/31]
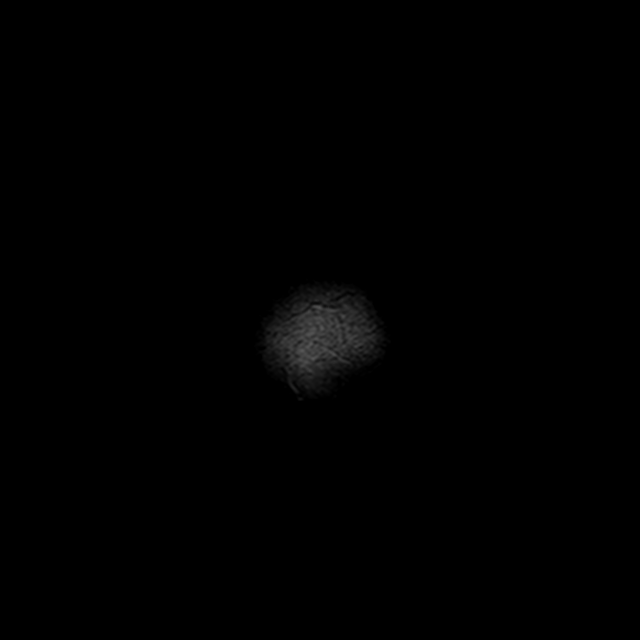

[34 of 48 positions shown; findings below may reference images not displayed]

FINDINGS: Brain: Cerebral volume within normal limits for age. Few scattered
patchy subcentimeter T2/FLAIR hyperintensity noted about the
periventricular and deep white matter both cerebral hemispheres,
nonspecific, but most commonly related to chronic microvascular
ischemic disease. Overall, changes are mild for age. Probable small
remote lacunar infarct noted within the ventral right thalamus.
Additional tiny remote lacunar infarct present at the right basal
ganglia.

8 mm focus of mild diffusion signal seen involving the
subcortical/deep white matter of the anterior left frontal lobe
(series 5, image 85). Associated prominent T2/FLAIR signal
abnormality (series 11, image 20). No associated ADC correlate.
Finding is nonspecific, but favored to reflect changes of evolving
late subacute small vessel ischemia. No associated hemorrhage or
mass effect.

No other diffusion abnormality to suggest acute or subacute ischemia
elsewhere within the brain. Gray-white matter differentiation
otherwise maintained. No encephalomalacia to suggest chronic
cortical infarction. No evidence for acute or chronic intracranial
hemorrhage.

No mass lesion, midline shift or mass effect. No hydrocephalus or
extra-axial fluid collection. Pituitary gland suprasellar region
within normal limits. Midline structures intact and normal.

Vascular: Major intracranial vascular flow voids are maintained.

Skull and upper cervical spine: Craniocervical junction within
normal limits. Bone marrow signal intensity normal. No scalp soft
tissue abnormality.

Sinuses/Orbits: Patient status post bilateral ocular lens
replacement. Globes and orbital soft tissues demonstrate no acute
finding. Paranasal sinuses are clear. Small left mastoid effusion
noted. Inner ear structures grossly normal. Negative nasopharynx.

Other: None.
IMPRESSION: 1. 8 mm focus of mild diffusion signal abnormality involving the
subcortical/deep white matter of the anterior left frontal lobe,
nonspecific, but favored to reflect changes of evolving late
subacute small vessel ischemia. No associated hemorrhage or mass
effect.
2. No other acute intracranial abnormality.
3. Mild for age chronic microvascular ischemic disease with small
remote lacunar infarcts involving the right thalamus and right basal
ganglia.

## 2021-04-23 IMAGING — CT CT HEAD W/O CM
4 series · 17 of 47 positions shown, 19 images · non-contrast
Comparison: [DATE]

CLINICAL DATA: Neuro deficit

EXAM:
CT HEAD WITHOUT CONTRAST
TECHNIQUE: Contiguous axial images were obtained from the base of the skull
through the vertex without intravenous contrast.

[Series 2: head wo · axial · 0.40mm/px · z∈[-54,+66]mm · 7 of 33 slices shown, 9 images]
[im 5/33  brain]
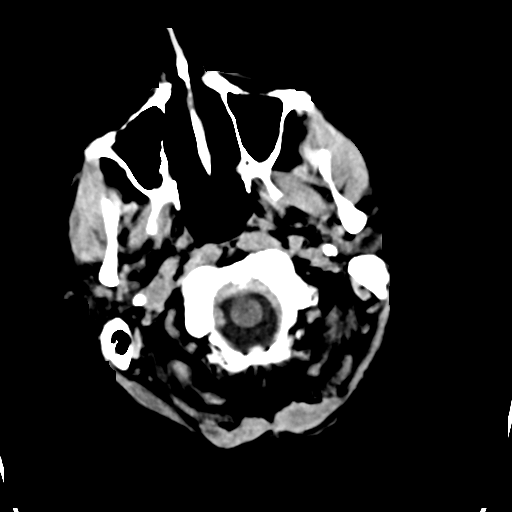
[im 5/33  bone]
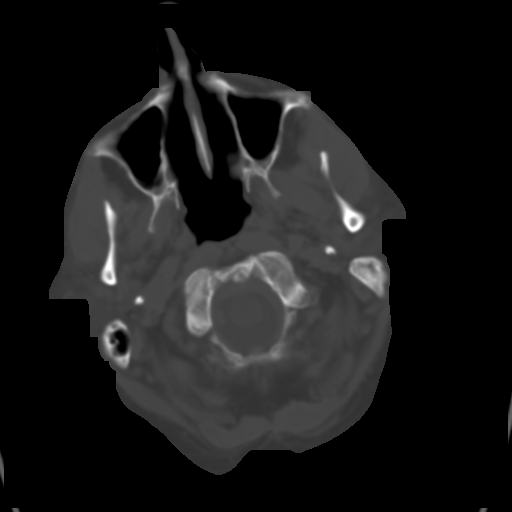
[im 9/33  brain]
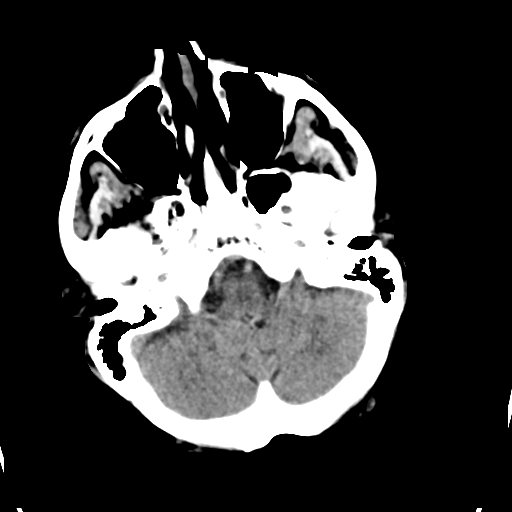
[im 13/33  brain]
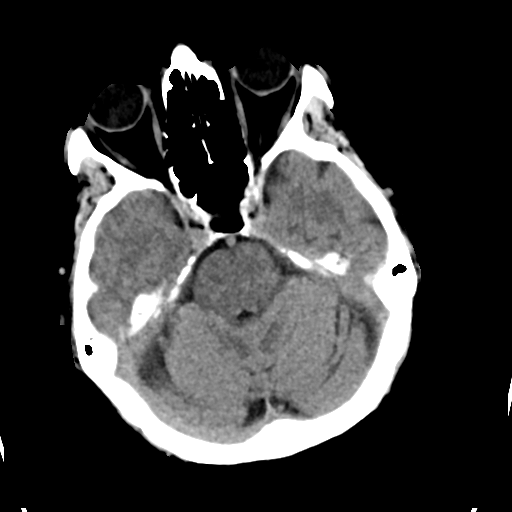
[im 17/33  brain]
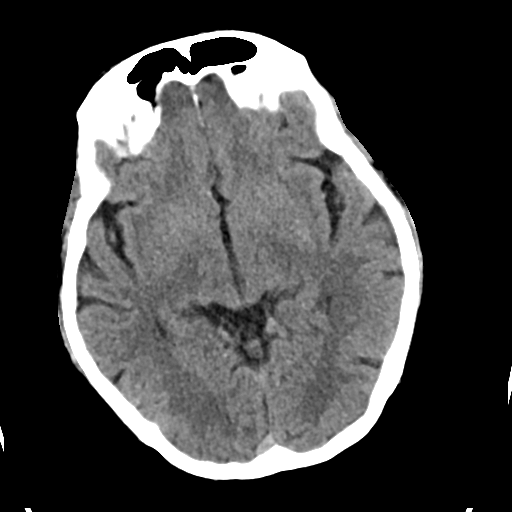
[im 21/33  brain]
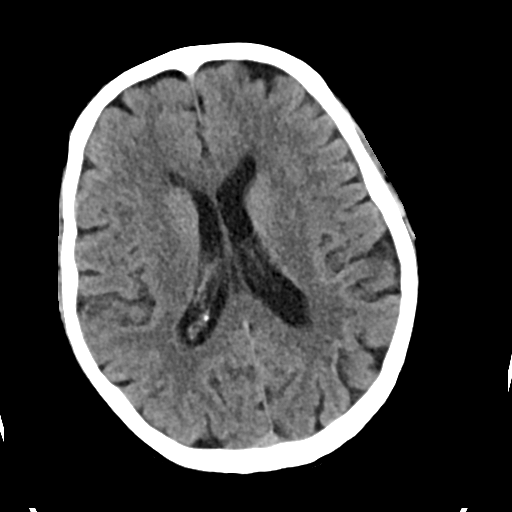
[im 21/33  bone]
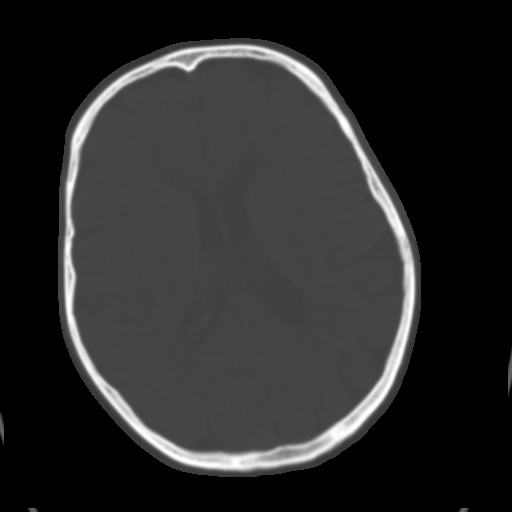
[im 25/33  brain]
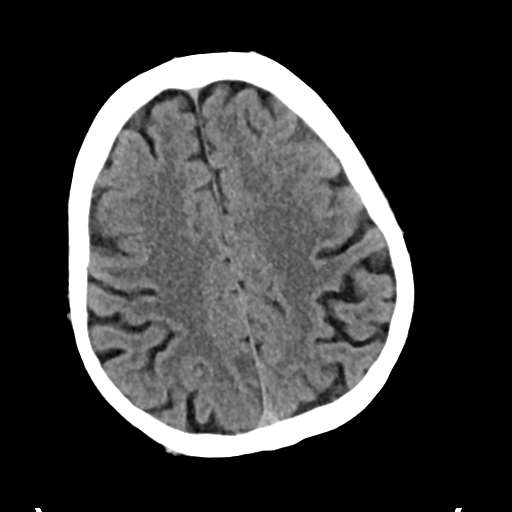
[im 29/33  brain]
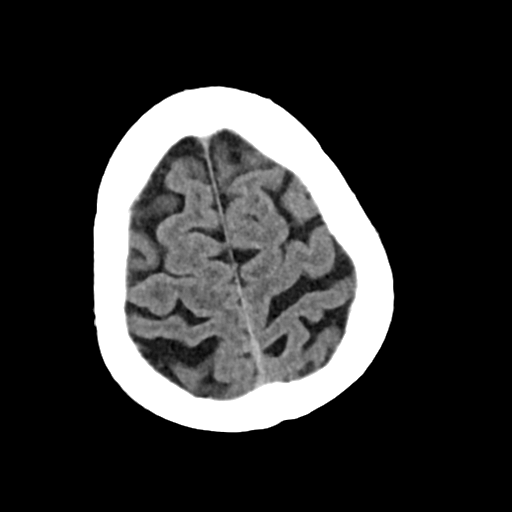

[Series 3: head bone · axial · 0.40mm/px · z∈[-58,-2]mm · 4 of 82 slices shown]
[im 9/82  bone]
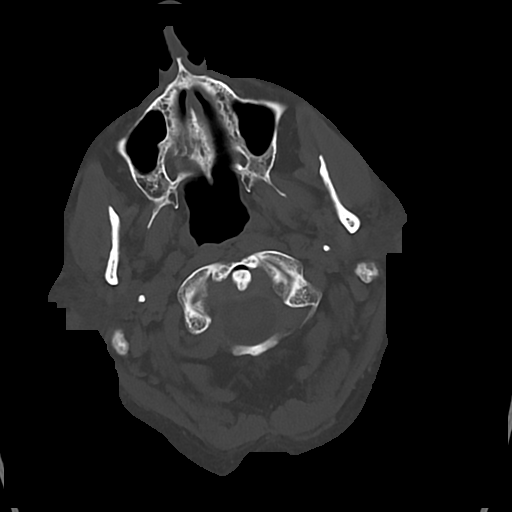
[im 17/82  bone]
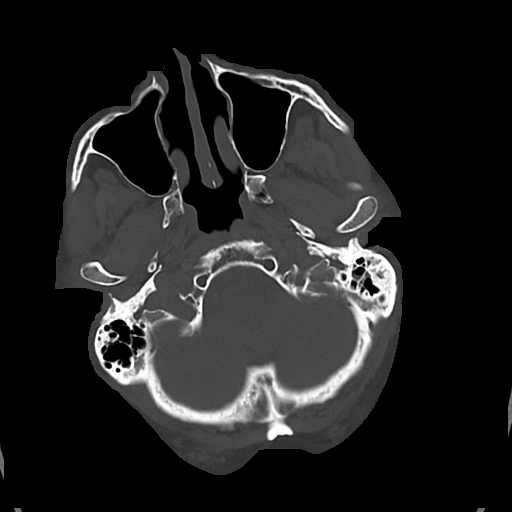
[im 25/82  bone]
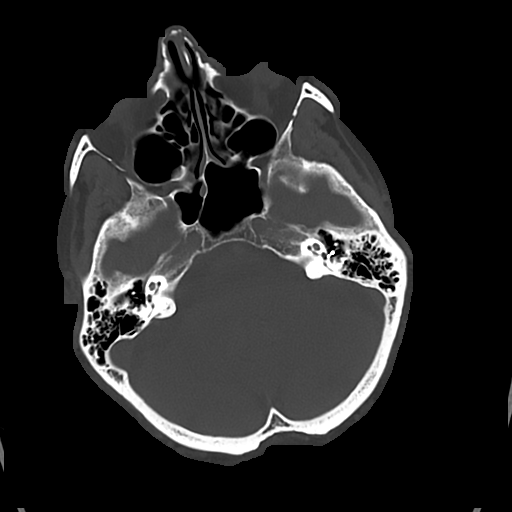
[im 37/82  bone]
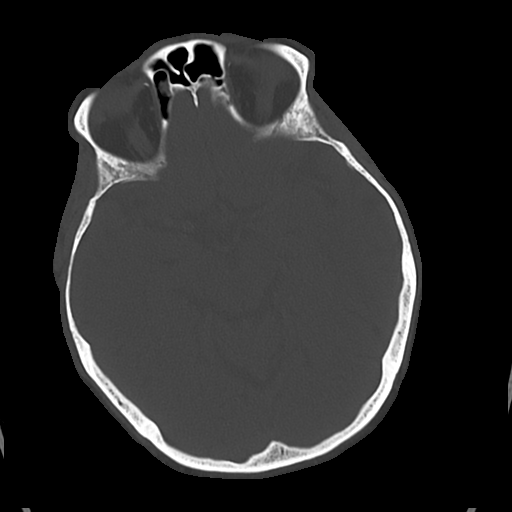

[Series 4: cor soft · coronal · 0.34mm/px · 3 of 66 slices shown]
[im 22/66  brain]
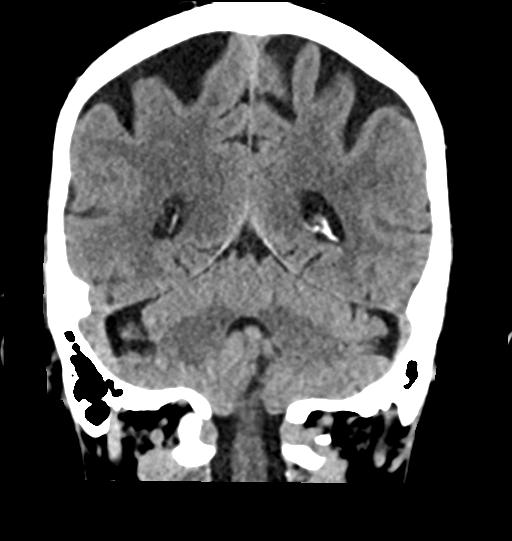
[im 29/66  brain]
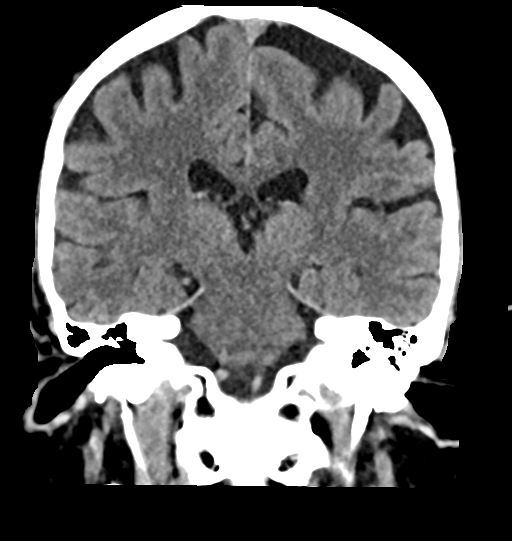
[im 37/66  brain]
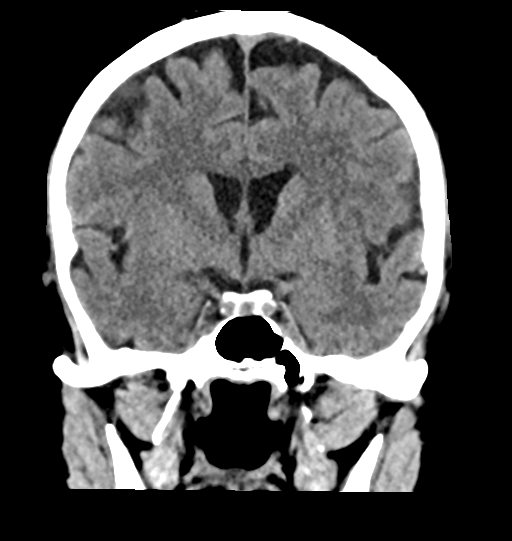

[Series 5: sag soft · sagittal · 0.38mm/px · 3 of 57 slices shown]
[im 19/57  brain]
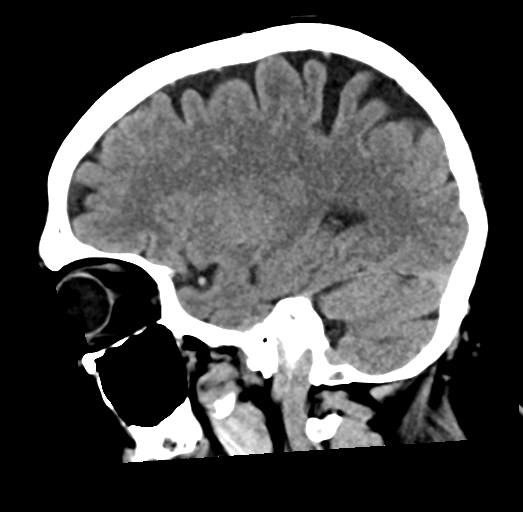
[im 29/57  brain]
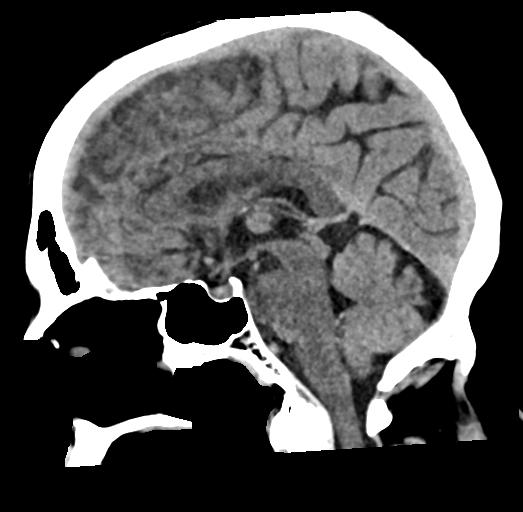
[im 38/57  brain]
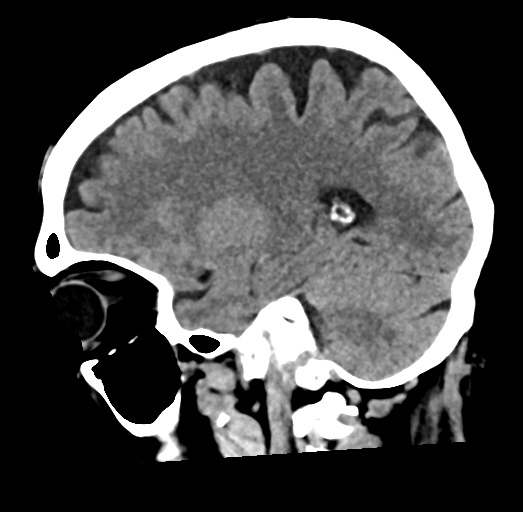

[17 of 47 positions shown; findings below may reference images not displayed]

FINDINGS: Brain: No evidence of acute infarction, hemorrhage, hydrocephalus,
extra-axial collection or mass lesion/mass effect. Mild atrophic
changes are noted.

Vascular: No hyperdense vessel or unexpected calcification.

Skull: Normal. Negative for fracture or focal lesion.

Sinuses/Orbits: No acute finding.

Other: None.
IMPRESSION: No acute intracranial abnormality noted.

## 2021-04-23 MED ORDER — ACETAMINOPHEN 650 MG RE SUPP: 650.0000 mg | Freq: Four times a day (QID) | RECTAL | Status: DC | PRN

## 2021-04-23 MED ORDER — ACETAMINOPHEN 325 MG PO TABS
650.0000 mg | ORAL_TABLET | Freq: Four times a day (QID) | ORAL | Status: DC | PRN
  Administered 2021-04-27: 650 mg via ORAL
  Filled 2021-04-23: qty 2

## 2021-04-23 MED ORDER — CARBIDOPA-LEVODOPA 25-100 MG PO TABS: 2.0000 | ORAL_TABLET | Freq: Three times a day (TID) | ORAL | Status: DC

## 2021-04-23 MED ORDER — ACETAMINOPHEN 500 MG PO TABS
1000.0000 mg | ORAL_TABLET | Freq: Once | ORAL | Status: AC
  Administered 2021-04-23: 1000 mg via ORAL
  Filled 2021-04-23: qty 2

## 2021-04-23 MED ORDER — CARBIDOPA-LEVODOPA ER 50-200 MG PO TBCR
1.0000 | EXTENDED_RELEASE_TABLET | Freq: Every day | ORAL | Status: DC
  Administered 2021-04-24 – 2021-04-27 (×5): 1 via ORAL
  Filled 2021-04-23 (×7): qty 1

## 2021-04-23 MED ORDER — LABETALOL HCL 5 MG/ML IV SOLN
10.0000 mg | Freq: Once | INTRAVENOUS | Status: AC
  Administered 2021-04-23: 10 mg via INTRAVENOUS
  Filled 2021-04-23: qty 4

## 2021-04-23 MED ORDER — CARBIDOPA-LEVODOPA 25-100 MG PO TABS: 2.0000 | ORAL_TABLET | ORAL | Status: DC

## 2021-04-23 MED ORDER — CARBIDOPA-LEVODOPA 25-100 MG PO TABS: 2.5000 | ORAL_TABLET | Freq: Every day | ORAL | Status: DC

## 2021-04-23 NOTE — H&P (Signed)
History and Physical    PLEASE NOTE THAT DRAGON DI[employer] SOFTWARE WAS USED IN THE CONSTRUCTION OF THIS NOTE.   Terry [name] YQM:[id number] DOB: 01/22/1946 DOA: 04/23/2021  PCP: Georgianne Fick, MD Patient coming from: home   I have personally briefly reviewed patient's old medical records in [facility]  Chief Complaint: dizziness   HPI: Terry [name] is a 75 y.o. male with medical history significant for Parkinson's disease, carotid artery stenosis, hypertension, who is admitted to [facility] on 04/23/2021 with suspected acute ischemic CVA after presenting from home to [facility] ED complaining of dizziness.   The patient reports 4 days of dizziness associated with left-sided headache.  Dizziness is not been associated with any vertigo, presyncope, or syncope.  He also denies any associated acute focal weakness, paresthesias, numbness, dysphagia, nausea, vomiting, acute change in vision, word finding difficulties, slurring of speech, facial droop.  He also denies any associated chest pain, shortness of breath, rotations, diaphoresis.  He reports associated near constant left-sided headache over that timeframe.  Denies any associated visual or olfactory aura or any scintillating scotoma.  He conveys a history of recurrent tension headaches, but states that the headache that he has been experiencing over the last 4 days since qualitatively different than that which he typically experiences with this recurrent tension headaches.  Denies any preceding recent trauma.   Denies any previous history of stroke.  He confirms a history of carotid artery stenosis, with the patient reporting that he is on atorvastatin 20 mg p.o. daily as a portion of management for his carotid artery stenosis, as opposed to having any formal underlying diagnosis of hyperlipidemia.  In terms of additional modifiable ischemic CVA risk factors, patient confirms a history of essential hypertension for which he is on  valsartan.  Denies any known history of underlying diabetes, atrial fibrillation, or obstructive sleep apnea.  He conveys that he is a lifelong non-smoker.  He not on any blood thinners as an outpatient, including no aspirin.  Denies any history of gastrointestinal bleed.  Denies any associated subjective fever, chills, rigors, or generalized myalgias. Denies any associated new neck stiffness, rhinitis, rhinorrhea, sore throat, abdominal pain, diarrhea, or rash.  Denies dysuria, gross hematuria, or change in urinary urgency/frequency.       ED Course:  Vital signs in the ED were notable for the following: Afebrile; heart rate 74-84; maximum blood pressure noted to be 241/94, from the single dose of IV labetalol with ensuing improvement in blood pressure up to 218 108, with most recent blood pressure noted to be 150/90 without any additional interval antihypertensive intervention; respiratory rate 14-20, oxygen saturation 98 to 100% on room air.  Labs were notable for the following: CMP notable for the following: Sodium 136, creatinine 0.96 relative to most recent prior value of 0.95 on 04/12/2021.  High-sensitivity troponin I 6.  CBC notable for white blood cell count 8500, hemoglobin 13.4.  Urinalysis showed no white blood cells, no bacteria, leukocyte Estrace negative, nitrate negative, and no proteinuria.  COVID-19/influenza PCR were checked in the ED today with results currently pending.  Imaging and additional notable ED work-up: EKG showed sinus rhythm with heart rate 76, no T wave changes, and nonspecific less than 1 mm ST depression in leads I, V4, V5, without any evidence of ST elevation.  Noncontrast CT of the head showed no evidence of acute intracranial process, including no evidence of intracranial hemorrhage.  Neurology was consulted, recommending MRI brain to further evaluate for underlying  stroke.  While in the ED, the following were administered: Acetaminophen 1 g p.o. x1 as well as the  aforementioned labetalol 10 mg IV x1.     Review of Systems: As per HPI otherwise 10 point review of systems negative.   Past Medical History:  Diagnosis Date   Carotid artery stenosis 04/29/2018   Chronic tension headaches 04/29/2018   Essential hypertension 04/29/2018   GERD (gastroesophageal reflux disease) 04/29/2018   HLD (hyperlipidemia) 04/29/2018   Parkinson's disease (HCC) 04/29/2018    Past Surgical History:  Procedure Laterality Date   LEFT HEART CATH AND CORONARY ANGIOGRAPHY N/A 11/25/2019   Procedure: LEFT HEART CATH AND CORONARY ANGIOGRAPHY;  Surgeon: Lyn Records, MD;  Location: [facility] INVASIVE CV LAB;  Service: Cardiovascular;  Laterality: N/A;   TONSILLECTOMY      Social History:  reports that he has never smoked. His smokeless tobacco use includes snuff. He reports current alcohol use. He reports that he does not use drugs.   Allergies  Allergen Reactions   Penicillins Rash   Erythromycin Rash   Calm Nausea And Vomiting    Family History  Problem Relation Age of Onset   Heart disease Mother    Cancer Father     Family history reviewed and not pertinent    Prior to Admission medications   Medication Sig Start Date End Date Taking? Authorizing Provider  acetaminophen (TYLENOL) 500 MG tablet Take 500 mg by mouth every 6 (six) hours as needed for moderate pain or headache.   Yes [provider]  atorvastatin (LIPITOR) 20 MG tablet TAKE ONE TABLET BY MOUTH EVERY NIGHT AT BEDTIME Patient taking differently: Take 20 mg by mouth every evening. 05/19/20  Yes Bloomfield, Carley D, DO  betamethasone acetate-betamethasone sodium phosphate (CELESTONE) 6 (3-3) MG/ML injection Every 6 months 11/11/19  Yes [provider]  carbidopa-levodopa (SINEMET CR) 50-200 MG tablet Take 1 tablet by mouth at bedtime. 07/29/18  Yes Lanelle Bal, MD  carbidopa-levodopa (SINEMET IR) 25-100 MG tablet Take 2.5 tablets by mouth 5 (five) times daily.   Yes [provider]  cetirizine (ZYRTEC) 10 MG tablet Take 10 mg by mouth as needed for allergies.    Yes [provider]  fluticasone (FLONASE) 50 MCG/ACT nasal spray Place 1 spray into both nostrils daily as needed for allergies.    Yes [provider]  meloxicam (MOBIC) 15 MG tablet Take 15 mg by mouth at bedtime as needed for pain. 11/03/19  Yes [provider]  NEUPRO 4 MG/24HR Place 1 patch onto the skin daily. 04/19/21  Yes [provider]  nitroGLYCERIN (NITROSTAT) 0.4 MG SL tablet Place 1 tablet (0.4 mg total) under the tongue every 5 (five) minutes as needed for chest pain. 10/16/19 04/23/21 Yes O'Neal, Ronnald Ramp, MD  Olopatadine HCl 0.2 % SOLN Apply 1 drop to eye daily as needed (allergies).   Yes [provider]  omeprazole (PRILOSEC) 40 MG capsule Take 1 capsule (40 mg total) by mouth daily. 04/13/20  Yes Christian, Rylee, MD  tiZANidine (ZANAFLEX) 2 MG tablet Take 2 mg by mouth daily as needed for muscle spasms. 04/18/21  Yes [provider]  traZODone (DESYREL) 50 MG tablet Take 1 tablet (50 mg total) by mouth at bedtime as needed for sleep. 09/03/18  Yes Lanelle Bal, MD  valsartan (DIOVAN) 80 MG tablet Take 80 mg by mouth daily. 03/23/21  Yes [provider]  amLODipine (NORVASC) 10 MG tablet Take 1 tablet (10 mg total)  by mouth daily. Patient not taking: Reported on 04/23/2021 11/18/19 02/16/20  Sande Rives, MD  Tadalafil (CIALIS) 2.5 MG TABS Take 1 tablet (2.5 mg total) by mouth daily. Patient not taking: Reported on 04/23/2021 07/16/19 07/15/20  Lanelle Bal, MD  valsartan (DIOVAN) 160 MG tablet Take 1 tablet (160 mg total) by mouth daily. Patient not taking: No sig reported 11/18/19   Beatriz Stallion., PA-C     Objective    Physical Exam: Vitals:   04/23/21 2130 04/23/21 2200 04/23/21 2230 04/23/21 2330  BP: (!) 206/85 (!) 241/94 (!) 208/93 (!) 218/108  Pulse: 78 82 79 77  Resp: 18 (!) 21 20 16    Temp:      TempSrc:      SpO2: 98% 99% 98% 94%  Weight:      Height:        General: appears to be stated age; alert, oriented Skin: warm, dry, no rash Head:  AT/[place] Mouth:  Oral mucosa membranes appear moist, normal dentition Neck: supple; trachea midline Heart:  RRR; did not appreciate any M/R/G Lungs: CTAB, did not appreciate any wheezes, rales, or rhonchi Abdomen: + BS; soft, ND, NT Vascular: 2+ pedal pulses b/l; 2+ radial pulses b/l Extremities: no peripheral edema, no muscle wasting Neuro: strength and sensation intact in upper and lower extremities b/l cranial nerves II through XII grossly intact; no evidence suggestive of slurred speech, dysarthria, or facial droop;     Labs on Admission: I have personally reviewed following labs and imaging studies  CBC: Recent Labs  Lab 04/20/21 2228 04/23/21 2023  WBC 8.1 8.5  NEUTROABS  --  5.3  HGB 13.4 13.4  HCT 40.5 41.2  MCV 89.6 90.4  PLT 180 182   Basic Metabolic Panel: Recent Labs  Lab 04/20/21 2228 04/23/21 2023  NA 138 139  K 4.5 3.9  CL 102 105  CO2 30 25  GLUCOSE 110* 101*  BUN 13 18  CREATININE 0.95 0.96  CALCIUM 9.1 8.6*   GFR: Estimated Creatinine Clearance: 76.3 mL/min (by C-G formula based on SCr of 0.96 mg/dL). Liver Function Tests: Recent Labs  Lab 04/20/21 2228 04/23/21 2023  AST 15 14*  ALT <5 <5  ALKPHOS 55 52  BILITOT 1.0 0.7  PROT 4.5* 6.5  ALBUMIN 4.0 4.1   No results for input(s): LIPASE, AMYLASE in the last 168 hours. No results for input(s): AMMONIA in the last 168 hours. Coagulation Profile: No results for input(s): INR, PROTIME in the last 168 hours. Cardiac Enzymes: No results for input(s): CKTOTAL, CKMB, CKMBINDEX, TROPONINI in the last 168 hours. BNP (last 3 results) No results for input(s): PROBNP in the last 8760 hours. HbA1C: No results for input(s): HGBA1C in the last 72 hours. CBG: No results for input(s): GLUCAP in the last 168 hours. Lipid Profile: No  results for input(s): CHOL, HDL, LDLCALC, TRIG, CHOLHDL, LDLDIRECT in the last 72 hours. Thyroid Function Tests: No results for input(s): TSH, T4TOTAL, FREET4, T3FREE, THYROIDAB in the last 72 hours. Anemia Panel: No results for input(s): VITAMINB12, FOLATE, FERRITIN, TIBC, IRON, RETICCTPCT in the last 72 hours. Urine analysis:    Component Value Date/Time   COLORURINE YELLOW 04/23/2021 2120   APPEARANCEUR CLEAR 04/23/2021 2120   LABSPEC 1.015 04/23/2021 2120   PHURINE 7.0 04/23/2021 2120   GLUCOSEU NEGATIVE 04/23/2021 2120   HGBUR SMALL (A) 04/23/2021 2120   BILIRUBINUR NEGATIVE 04/23/2021 2120   KETONESUR 5 (A) 04/23/2021 2120   PROTEINUR NEGATIVE 04/23/2021 2120  NITRITE NEGATIVE 04/23/2021 2120   LEUKOCYTESUR NEGATIVE 04/23/2021 2120    Radiological Exams on Admission: CT HEAD WO CONTRAST ( )  Result Date: 04/23/2021 CLINICAL DATA:  Neuro deficit EXAM: CT HEAD WITHOUT CONTRAST TECHNIQUE: Contiguous axial images were obtained from the base of the skull through the vertex without intravenous contrast. COMPARISON:  04/20/2021 FINDINGS: Brain: No evidence of acute infarction, hemorrhage, hydrocephalus, extra-axial collection or mass lesion/mass effect. Mild atrophic changes are noted. Vascular: No hyperdense vessel or unexpected calcification. Skull: Normal. Negative for fracture or focal lesion. Sinuses/Orbits: No acute finding. Other: None. IMPRESSION: No acute intracranial abnormality noted. Electronically Signed   By: Alcide Clever M.D.   On: 04/23/2021 21:05     EKG: Independently reviewed, with result as described above.    Assessment/Plan   Tejuan Gholson is a 75 y.o. male with medical history significant for Parkinson's disease, carotid artery stenosis, hypertension, who is admitted to [facility] on 04/23/2021 with suspected acute ischemic CVA after presenting from home to [facility] ED complaining of dizziness.   Principal Problem:   Stroke [facility]) Active Problems:    Essential hypertension   Parkinson's disease (HCC)   GERD (gastroesophageal reflux disease)   Hypertensive crisis   Headache     #) Subacute ischemic CVA: In the setting of presenting 4 days of left-sided headache, dizziness, MRI brain suggestive of subacute ischemic CVA involving the left frontal lobe, after a preceding noncontrast CT head showed no evidence of acute intracranial process, including no evidence of intracranial hemorrhage.  Last known normal appears to have been 4 days ago at the onset of his headache/dizziness.  Therefore, he presents outside of the window for tPA administration or thrombectomy. Will pursue further evaluation with MRA of the head without contrast as well as MRA neck with contrast, noting a history of carotid artery stenosis. Will also further evaluate for potential modifiable acute ischemic CVA risk factors, as described below. Will also monitor closely on telemetry to evaluate for the presence of previously undiagnosed atrial fibrillation.   Of note, the patient reportedly multiple modifiable CVA risk factors including a history of hypertension.  He denies any known history of underlying hyperlipidemia, but rather conveys that he is on atorvastatin 20 mg p.o. daily as an outpatient for his carotid artery stenosis.  Additionally, he denies any known history of underlying diabetes, obstructive sleep apnea, or paroxysmal atrial fibrillation.  Conveys that he is a lifelong non-smoker. EKG in ED today showed sinus rhythm, as further detailed above.  As his last known normal appears to have occurred 4 days ago, it appears that he is now out of the window for observance of permissive hypertension.  Given the patient's age, will refrain from inclusion of bubble study with ensuing echocardiogram.     Plan: Nursing bedside swallow evaluation x 1 now, and will not initiate oral medications or diet until the patient has passed this. Head of the bed at 30 degrees. Neuro checks  per protocol. VS per protocol.  Refraining from permissive hypertension given timing of last known normal, as further detailed above.   Monitor on telemetry, including monitoring for atrial fibrillation as modifiable risk factor for acute ischemic CVA.  MRA head/neck, as further detailed above.  TTE withOUT bubble study has been ordered for the morning. Check lipid panel and A1c. PT/OT consults have been ordered to occur in the morning. Will start daily baby aspirin.  Neurology consulted.  Continue invasive atorvastatin for now.  As needed acetaminophen for headache.       #)  Hypertensive crisis: On the basis of presenting blood pressure of 241/94, with associated initial mean arterial pressure of 143 mmHg and associated with headache, dizziness over the preceding 4 days, with ensuing evaluation this evening notable for MRI brain showing evidence of subacute ischemic CVA, as further detailed above, with timing of last known normal outside of the 24 to 48-hour window for observance of permissive hypertension.  Received labetalol 10 mg IV x1 in the ED, with ensuing improvement to 218/108, consistent with 10-20% reduction in blood pressure over the first hour. Subsequent short term blood pressure goals include a reduction by an additional 5-15% over the next 24 hours resulting in a total reduction by 15-35% over the period, which would coincide with goal systolic blood pressure of less than 157-205 mmHg or MAP less than 93-122 mmHg over that time.    No laboratory evidence to suggest organ damage, including presenting renal function found to be at baseline.Will engage in additional evaluation and management of presenting subacute ischemic CVA, as further outlined above, and also check for other risk factors for hypertensive crisis, including checking TSH and urinary drug screen.  Of note, presentation associated with any chest pain, and high-sensitivity troponin I found to be nonelevated and spite of presenting  symptoms of dizziness/headache over the last 4 days.  Of note, this is in the context of patient's documented history of essential hypertension for which she is on valsartan as a sole outpatient antihypertensive medication.      Plan: Will closely monitor ensuing BP's, with goal BP's as outlined above. Will continue to monitor for laboratory evidence of end organ damage with repeat CMP in the morning. Check TSH and UDS. Monitor strict I's and O's.  Monitor on telemetry. prn IV labetalol for systolic blood pressure greater than 180 mmHg. resume home valsartan.  Serial neurochecks and additional evaluation management presenting subacute ischemic CVA, as further outlined above.      #)Parkinson's disease: Documented history of such, on carbidopa levodopa as an outpatient in addition to rotigotine patch.   Plan: Continue outpatient carbidopa levodopa as well as rotigotine patch.      #) GERD: On omeprazole as an outpatient.  Plan: Continue home PPI.       DVT prophylaxis: SCDs Code Status: Full code Family Communication: none Disposition Plan: Per Rounding Team Consults called: neurology consulted, as above  Admission status: observation;      Of note, this patient was added by me to the following Admit List/Treatment Team:  mcadmits.   Of note, the Adult Admission Order Set (Multimorbid order set) was used by me in the admission process for this patient.  PLEASE NOTE THAT DRAGON DICTATION SOFTWARE WAS USED IN THE CONSTRUCTION OF THIS NOTE.   Angie Fava DO [facility] Pager [phone] From 7PM - 7AM   04/23/2021, 11:47 PM

## 2021-04-23 NOTE — ED Provider Notes (Signed)
[facility] EMERGENCY DEPARTMENT Provider Note   CSN: [id number] Arrival date & time: 04/23/21  1951     History Chief Complaint  Patient presents with   Hypertension   Dizziness    Authur [name] is a 75 y.o. male.  75 y.o male with a PMH of GERD, Parkinsons, Chronic tension headaches presents to the ED via EMS with a chief complaint of hyper pressure, headache, dizziness for the past week.  According to patient, he has had a generalized intermittent headache to the frontal aspect of his head which she describes as dull and aching, no exacerbating or alleviating factors.  Does report his blood pressure has been running high with a systolic in the 200s for the past 3 days.  Does report a change in his blood pressure regimen, now taking his blood pressure around 10 PM instead of at noon.  He also endorses blurry vision that began a couple days ago, does report he wears glasses at baseline however has not had these changed recently.  In addition, he describes dizziness, which happens every time he tries to get out of bed, does report having changes in his gait due to his history of Parkinson's, however in the last couple of days he feels that he is very unsteady on his feet. Of note, patient did have a mechanical fall about a week ago, states that he did not strike his head.  He is currently on no blood thinners, he denies any chest pain, shortness of breath, abdominal pain.  No fever, no other complaints.   The history is provided by the patient and medical records.  Hypertension This is a recurrent problem. Associated symptoms include headaches. Pertinent negatives include no chest pain, no abdominal pain and no shortness of breath.  Dizziness Associated symptoms: headaches   Associated symptoms: no chest pain, no nausea, no shortness of breath and no vomiting       Past Medical History:  Diagnosis Date   Carotid artery stenosis 04/29/2018   Chronic tension headaches  04/29/2018   Essential hypertension 04/29/2018   GERD (gastroesophageal reflux disease) 04/29/2018   HLD (hyperlipidemia) 04/29/2018   Parkinson's disease (HCC) 04/29/2018    Patient Active Problem List   Diagnosis Date Noted   Chest tightness 10/09/2019   Shortness of breath 10/09/2019   Erectile dysfunction 03/12/2019   Essential hypertension 04/29/2018   Need for immunization against influenza 04/29/2018   Parkinson's disease (HCC) 04/29/2018   Carotid artery stenosis 04/29/2018   HLD (hyperlipidemia) 04/29/2018   GERD (gastroesophageal reflux disease) 04/29/2018   Colon cancer screening 04/29/2018   Chronic tension headaches 04/29/2018    Past Surgical History:  Procedure Laterality Date   LEFT HEART CATH AND CORONARY ANGIOGRAPHY N/A 11/25/2019   Procedure: LEFT HEART CATH AND CORONARY ANGIOGRAPHY;  Surgeon: Lyn Records, MD;  Location: [facility] INVASIVE CV LAB;  Service: Cardiovascular;  Laterality: N/A;   TONSILLECTOMY         Family History  Problem Relation Age of Onset   Heart disease Mother    Cancer Father     Social History   Tobacco Use   Smoking status: Never   Smokeless tobacco: Current    Types: Snuff  Substance Use Topics   Alcohol use: Yes    Comment: Occasionally.   Drug use: Never    Home Medications Prior to Admission medications   Medication Sig Start Date End Date Taking? Authorizing Provider  acetaminophen (TYLENOL) 500 MG tablet Take 500 mg  by mouth every 6 (six) hours as needed for moderate pain or headache.   Yes [provider]  atorvastatin (LIPITOR) 20 MG tablet TAKE ONE TABLET BY MOUTH EVERY NIGHT AT BEDTIME Patient taking differently: Take 20 mg by mouth every evening. 05/19/20  Yes Bloomfield, Carley D, DO  betamethasone acetate-betamethasone sodium phosphate (CELESTONE) 6 (3-3) MG/ML injection Every 6 months 11/11/19  Yes [provider]  carbidopa-levodopa (SINEMET CR) 50-200 MG tablet Take 1 tablet by mouth at bedtime.  07/29/18  Yes Lanelle Bal, MD  carbidopa-levodopa (SINEMET IR) 25-100 MG tablet Take 2.5 tablets by mouth 5 (five) times daily.   Yes [provider]  cetirizine (ZYRTEC) 10 MG tablet Take 10 mg by mouth as needed for allergies.    Yes [provider]  fluticasone (FLONASE) 50 MCG/ACT nasal spray Place 1 spray into both nostrils daily as needed for allergies.    Yes [provider]  meloxicam (MOBIC) 15 MG tablet Take 15 mg by mouth at bedtime as needed for pain. 11/03/19  Yes [provider]  NEUPRO 4 MG/24HR Place 1 patch onto the skin daily. 04/19/21  Yes [provider]  nitroGLYCERIN (NITROSTAT) 0.4 MG SL tablet Place 1 tablet (0.4 mg total) under the tongue every 5 (five) minutes as needed for chest pain. 10/16/19 04/23/21 Yes O'Neal, Ronnald Ramp, MD  Olopatadine HCl 0.2 % SOLN Apply 1 drop to eye daily as needed (allergies).   Yes [provider]  omeprazole (PRILOSEC) 40 MG capsule Take 1 capsule (40 mg total) by mouth daily. 04/13/20  Yes Christian, Rylee, MD  tiZANidine (ZANAFLEX) 2 MG tablet Take 2 mg by mouth daily as needed for muscle spasms. 04/18/21  Yes [provider]  traZODone (DESYREL) 50 MG tablet Take 1 tablet (50 mg total) by mouth at bedtime as needed for sleep. 09/03/18  Yes Lanelle Bal, MD  valsartan (DIOVAN) 80 MG tablet Take 80 mg by mouth daily. 03/23/21  Yes [provider]  amLODipine (NORVASC) 10 MG tablet Take 1 tablet (10 mg total) by mouth daily. Patient not taking: Reported on 04/23/2021 11/18/19 02/16/20  Sande Rives, MD  Tadalafil (CIALIS) 2.5 MG TABS Take 1 tablet (2.5 mg total) by mouth daily. Patient not taking: Reported on 04/23/2021 07/16/19 07/15/20  Lanelle Bal, MD  valsartan (DIOVAN) 160 MG tablet Take 1 tablet (160 mg total) by mouth daily. Patient not taking: No sig reported 11/18/19   Beatriz Stallion., PA-C    Allergies    Penicillins, Erythromycin, and  Calm  Review of Systems   Review of Systems  Constitutional:  Negative for chills and fever.  HENT:  Negative for sore throat.   Respiratory:  Negative for shortness of breath.   Cardiovascular:  Negative for chest pain.  Gastrointestinal:  Negative for abdominal pain, nausea and vomiting.  Genitourinary:  Negative for hematuria.  Musculoskeletal:  Negative for back pain.  Skin:  Negative for pallor and wound.  Neurological:  Positive for dizziness, tremors and headaches.  All other systems reviewed and are negative.  Physical Exam Updated Vital Signs BP (!) 218/108   Pulse 77   Temp 98.2 F (36.8 C) (Oral)   Resp 16   Ht 6\' 1"  (1.854 m)   Wt 83.5 kg   SpO2 94%   BMI 24.28 kg/m   Physical Exam Vitals and nursing note reviewed.  Constitutional:      Appearance: Normal appearance.  HENT:     Head: Normocephalic and atraumatic.  Nose: Nose normal.     Mouth/Throat:     Mouth: Mucous membranes are moist.  Eyes:     Pupils: Pupils are equal, round, and reactive to light.  Cardiovascular:     Rate and Rhythm: Normal rate.  Pulmonary:     Effort: Pulmonary effort is normal.     Breath sounds: No wheezing.  Abdominal:     General: Abdomen is flat.     Tenderness: There is no abdominal tenderness. There is no right CVA tenderness or left CVA tenderness.  Musculoskeletal:     Cervical back: Normal range of motion and neck supple.  Skin:    General: Skin is warm and dry.  Neurological:     Mental Status: He is alert and oriented to person, place, and time.     Cranial Nerves: Facial asymmetry present.     Comments: Facial asymmetry, does report this is his baseline since his diagnosis of Parkinson's.  Alert, oriented, thought content appropriate. Speech fluent without evidence of aphasia. Able to follow 2 step commands without difficulty.  Cranial Nerves:  II:  Peripheral visual fields grossly normal, pupils, round, reactive to light III,IV, VI: ptosis not present,  extra-ocular motions intact bilaterally  V,VII: facial light touch sensation equal VIII: hearing grossly normal bilaterally  IX,X: midline uvula rise  XI: bilateral shoulder shrug equal and strong XII: midline tongue extension  Motor:  5/5 in upper and lower extremities bilaterally including strong and equal grip strength and dorsiflexion/plantar flexion Sensory: light touch normal in all extremities.  Cerebellar: normal finger-to-nose with bilateral upper extremities, pronator drift negative       ED Results / Procedures / Treatments   Labs (all labs ordered are listed, but only abnormal results are displayed) Labs Reviewed  COMPREHENSIVE METABOLIC PANEL - Abnormal; Notable for the following components:      Result Value   Glucose, Bld 101 (*)    Calcium 8.6 (*)    AST 14 (*)    All other components within normal limits  URINALYSIS, ROUTINE W REFLEX MICROSCOPIC - Abnormal; Notable for the following components:   Hgb urine dipstick SMALL (*)    Ketones, ur 5 (*)    All other components within normal limits  CBC WITH DIFFERENTIAL/PLATELET  TROPONIN I (HIGH SENSITIVITY)  TROPONIN I (HIGH SENSITIVITY)    EKG None  Radiology CT HEAD WO CONTRAST ( )  Result Date: 04/23/2021 CLINICAL DATA:  Neuro deficit EXAM: CT HEAD WITHOUT CONTRAST TECHNIQUE: Contiguous axial images were obtained from the base of the skull through the vertex without intravenous contrast. COMPARISON:  04/20/2021 FINDINGS: Brain: No evidence of acute infarction, hemorrhage, hydrocephalus, extra-axial collection or mass lesion/mass effect. Mild atrophic changes are noted. Vascular: No hyperdense vessel or unexpected calcification. Skull: Normal. Negative for fracture or focal lesion. Sinuses/Orbits: No acute finding. Other: None. IMPRESSION: No acute intracranial abnormality noted. Electronically Signed   By: Alcide Clever M.D.   On: 04/23/2021 21:05    Procedures Procedures   Medications Ordered in  ED Medications  carbidopa-levodopa (SINEMET CR) 50-200 MG per tablet controlled release 1 tablet (has no administration in time range)  carbidopa-levodopa (SINEMET IR) 25-100 MG per tablet immediate release 2.5 tablet (has no administration in time range)  carbidopa-levodopa (SINEMET IR) 25-100 MG per tablet immediate release 2 tablet (has no administration in time range)  acetaminophen (TYLENOL) tablet 1,000 mg (1,000 mg Oral Given 04/23/21 2134)  labetalol (NORMODYNE) injection 10 mg (10 mg Intravenous Given 04/23/21 2320)  ED Course  I have reviewed the triage vital signs and the nursing notes.  Pertinent labs & imaging results that were available during my care of the patient were reviewed by me and considered in my medical decision making (see chart for details).    MDM Rules/Calculators/A&P   Patient with a past medical history of Parkinson's, GERD, chronic tension headaches presents to the ED with a chief complaint of hypertension, dizziness, blurry vision that has been ongoing for the last 4 days.  He does report taking his blood pressure medication and compliance with this, however recently began taking the medication around noon and not at 10 PM like his usual.  Does report a mechanical fall about a week ago, although he reports not striking his head.  During my primary evaluation patient is very pleasant, blood pressure is remarkably elevated with a systolic around the 200s.  Does report dizziness occurs when he tries to take his first step after sitting to standing.  Also states some blurry vision changes, although he does wear glasses at baseline, reports this has not occurred in the past.  Describes his headache as generalized frontal, has not really taken any medication for improvement in symptoms.  According to his records, he tried to be evaluated here 2 days ago, however left without being seen due to long wait times.  Patient with ischial asymmetry, however reports this is  due to his diagnosis of Parkinson without any changes.  Moves all upper and lower extremities.  Sensation is intact throughout.  No is on any vision field.  Lungs are clear to auscultation, abdomen is soft nontender to palpation. Fingers to nose are normal.  Interpretation of labs with a CMP with no electrolyte changes. Creatine level is within normal limits. LFTs are normal.  UA with small hemoglobin, no nitrites or leukocytes.  First troponin is 6.  CT Head showed:  No evidence of acute infarction, hemorrhage, hydrocephalus,  extra-axial collection or mass lesion/mass effect. Mild atrophic  changes are noted.   10:52 PM Spoke to neurology who suggested MRI brain, however concern for PRESS  11:17 PM patient question on medication, he does report taking both of his carbidopa, levodopa extended release and immediate release at 10 PM at night.  These have been reordered for patient.  11:40 PM Spoke to hospitalist team Dr. Arlean Hopping appreciate his assistance, will be admitted for PRESS at this time.   Portions of this note were generated with Scientist, clinical (histocompatibility and immunogenetics). Dictation errors may occur despite best attempts at proofreading.  Final Clinical Impression(s) / ED Diagnoses Final diagnoses:  Hypertensive emergency    Rx / DC Orders ED Discharge Orders     None        Claude Manges, Cordelia Poche 04/23/21 2342    Gwyneth Sprout, MD 04/24/21 [phone]

## 2021-04-23 NOTE — ED Triage Notes (Addendum)
BIB EMS, pt c/o HTN, headache and dizziness, worsening for the past week. Pt currently takes 80 mg Valsartan daily, took a dose today at 1200. Was just here Thursday night but left due to long wait to be seen. Hx of Parkinson's, HTN, CAS, heart murmur. BP w/EMS- 225/102, during triage 239/108. 20G IV in left hand

## 2021-04-23 NOTE — ED Notes (Signed)
Patient transported to MRI 

## 2021-04-24 ENCOUNTER — Observation Stay (HOSPITAL_COMMUNITY): Payer: Medicare PPO

## 2021-04-24 DIAGNOSIS — I6389 Other cerebral infarction: Secondary | ICD-10-CM | POA: Diagnosis not present

## 2021-04-24 DIAGNOSIS — I10 Essential (primary) hypertension: Secondary | ICD-10-CM | POA: Diagnosis not present

## 2021-04-24 DIAGNOSIS — I6522 Occlusion and stenosis of left carotid artery: Secondary | ICD-10-CM | POA: Diagnosis not present

## 2021-04-24 DIAGNOSIS — G20 Parkinson's disease: Secondary | ICD-10-CM | POA: Diagnosis not present

## 2021-04-24 DIAGNOSIS — I6523 Occlusion and stenosis of bilateral carotid arteries: Secondary | ICD-10-CM | POA: Diagnosis not present

## 2021-04-24 DIAGNOSIS — M47812 Spondylosis without myelopathy or radiculopathy, cervical region: Secondary | ICD-10-CM | POA: Diagnosis not present

## 2021-04-24 DIAGNOSIS — I169 Hypertensive crisis, unspecified: Secondary | ICD-10-CM | POA: Diagnosis not present

## 2021-04-24 DIAGNOSIS — I63232 Cerebral infarction due to unspecified occlusion or stenosis of left carotid arteries: Secondary | ICD-10-CM | POA: Diagnosis not present

## 2021-04-24 DIAGNOSIS — I639 Cerebral infarction, unspecified: Secondary | ICD-10-CM | POA: Diagnosis present

## 2021-04-24 DIAGNOSIS — Z8673 Personal history of transient ischemic attack (TIA), and cerebral infarction without residual deficits: Secondary | ICD-10-CM | POA: Diagnosis not present

## 2021-04-24 DIAGNOSIS — I672 Cerebral atherosclerosis: Secondary | ICD-10-CM | POA: Diagnosis not present

## 2021-04-24 DIAGNOSIS — R519 Headache, unspecified: Secondary | ICD-10-CM | POA: Diagnosis present

## 2021-04-24 LAB — LIPID PANEL
Cholesterol: 91 mg/dL (ref 0–200)
HDL: 37 mg/dL — ABNORMAL LOW (ref >40)
LDL Cholesterol: 25 mg/dL (ref 0–99)
Total CHOL/HDL Ratio: 2.5 RATIO
Triglycerides: 145 mg/dL (ref <150)
VLDL: 29 mg/dL (ref 0–40)

## 2021-04-24 LAB — COMPREHENSIVE METABOLIC PANEL
ALT: <5 U/L (ref 0–44)
AST: 14 U/L — ABNORMAL LOW (ref 15–41)
Albumin: 3.8 g/dL (ref 3.5–5.0)
Alkaline Phosphatase: 51 U/L (ref 38–126)
Anion gap: 7 (ref 5–15)
BUN: 17 mg/dL (ref 8–23)
CO2: 26 mmol/L (ref 22–32)
Calcium: 8.5 mg/dL — ABNORMAL LOW (ref 8.9–10.3)
Chloride: 104 mmol/L (ref 98–111)
Creatinine, Ser: 0.96 mg/dL (ref 0.61–1.24)
GFR, Estimated: >60 mL/min (ref >60)
Glucose, Bld: 119 mg/dL — ABNORMAL HIGH (ref 70–99)
Potassium: 3.4 mmol/L — ABNORMAL LOW (ref 3.5–5.1)
Sodium: 137 mmol/L (ref 135–145)
Total Bilirubin: 0.5 mg/dL (ref 0.3–1.2)
Total Protein: 5.9 g/dL — ABNORMAL LOW (ref 6.5–8.1)

## 2021-04-24 LAB — TROPONIN I (HIGH SENSITIVITY): Troponin I (High Sensitivity): 8 ng/L (ref <18)

## 2021-04-24 LAB — CBC
HCT: 39.1 % (ref 39.0–52.0)
Hemoglobin: 12.7 g/dL — ABNORMAL LOW (ref 13.0–17.0)
MCH: 29.4 pg (ref 26.0–34.0)
MCHC: 32.5 g/dL (ref 30.0–36.0)
MCV: 90.5 fL (ref 80.0–100.0)
Platelets: 166 10*3/uL (ref 150–400)
RBC: 4.32 MIL/uL (ref 4.22–5.81)
RDW: 13.8 % (ref 11.5–15.5)
WBC: 7.7 10*3/uL (ref 4.0–10.5)
nRBC: 0 % (ref 0.0–0.2)

## 2021-04-24 LAB — MAGNESIUM: Magnesium: 2 mg/dL (ref 1.7–2.4)

## 2021-04-24 LAB — RESP PANEL BY RT-PCR (FLU A&B, COVID) ARPGX2
Influenza A by PCR: NEGATIVE
Influenza B by PCR: NEGATIVE
SARS Coronavirus 2 by RT PCR: NEGATIVE

## 2021-04-24 LAB — ECHOCARDIOGRAM COMPLETE
Area-P 1/2: 3.16 cm2
Height: 73 in
Weight: 2944 oz

## 2021-04-24 LAB — HEMOGLOBIN A1C
Hgb A1c MFr Bld: 5.4 % (ref 4.8–5.6)
Mean Plasma Glucose: 108.28 mg/dL

## 2021-04-24 IMAGING — CT CT ANGIO NECK
2 of 6 series · 16 of 46 positions shown, 18 images · IV contrast (omnipaque)
Comparison: Neck MRA [DATE]

CLINICAL DATA: Stroke/TIA, assess extracranial arteries.

EXAM:
CT ANGIOGRAPHY NECK
TECHNIQUE: Multidetector CT imaging of the neck was performed using the
standard protocol during bolus administration of intravenous
contrast. Multiplanar CT image reconstructions and MIPs were
obtained to evaluate the vascular anatomy. Carotid stenosis
measurements (when applicable) are obtained utilizing NASCET
criteria, using the distal internal carotid diameter as the
denominator.
CONTRAST:  75mL OMNIPAQUE IOHEXOL 350 MG/ML SOLN

[Series 5: cta neck 2.0 i30f 3 · axial · 0.53mm/px · z∈[-372,-128]mm · 13 of 136 slices shown, 15 images]
[im 7/136  soft-tissue]
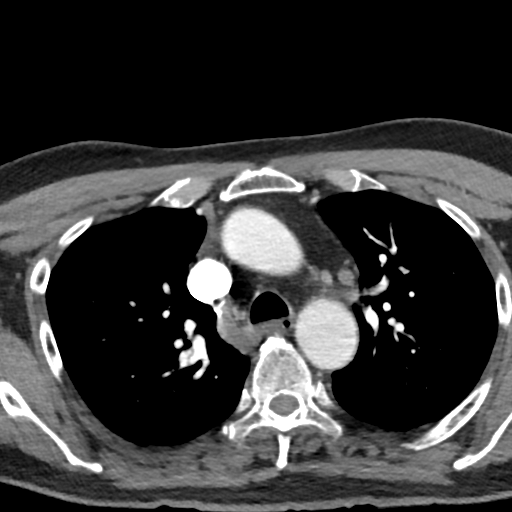
[im 7/136  bone]
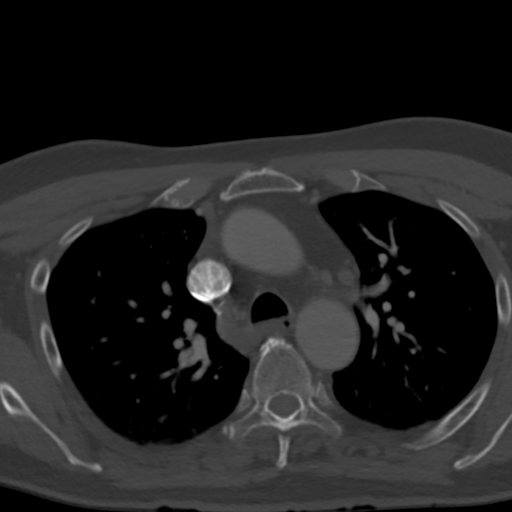
[im 20/136  soft-tissue]
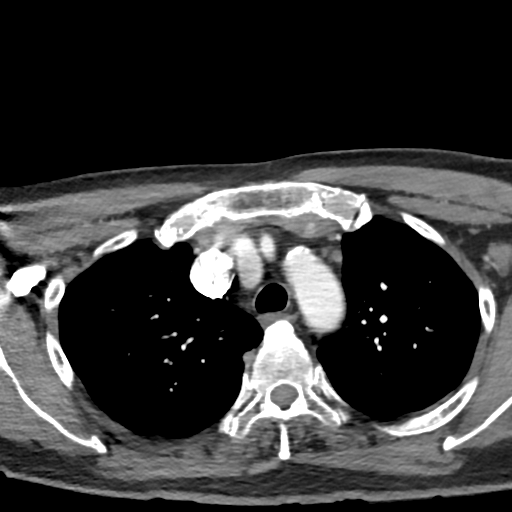
[im 26/136  soft-tissue]
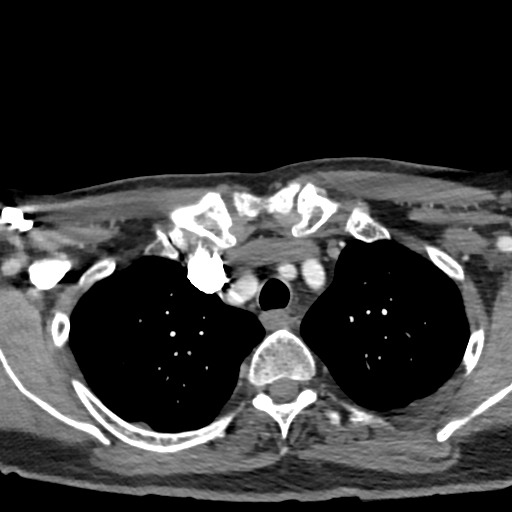
[im 39/136  soft-tissue]
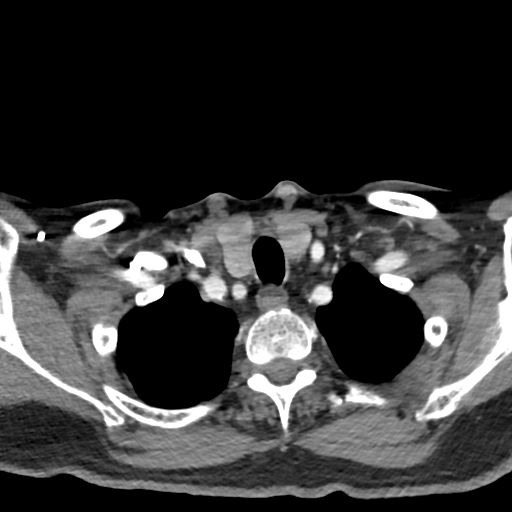
[im 46/136  soft-tissue]
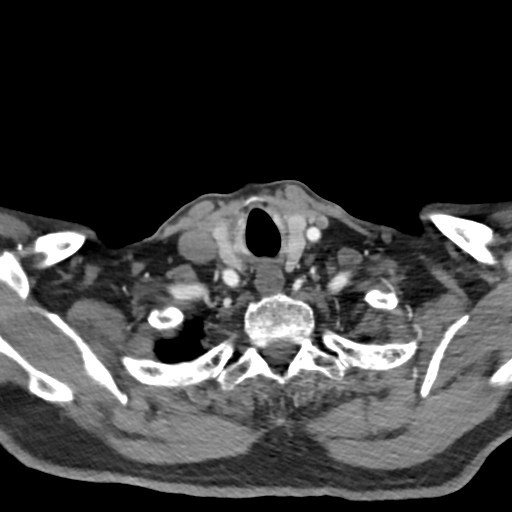
[im 58/136  soft-tissue]
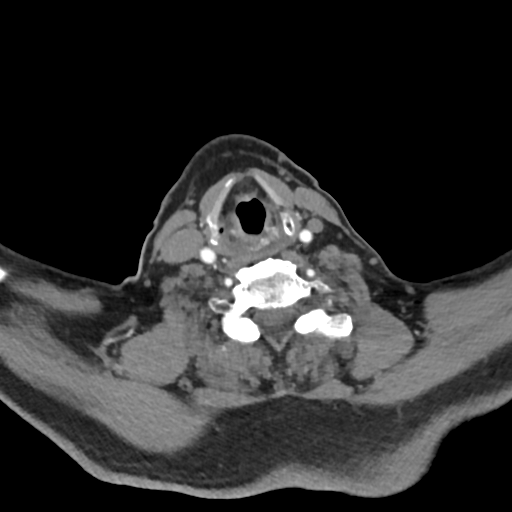
[im 71/136  soft-tissue]
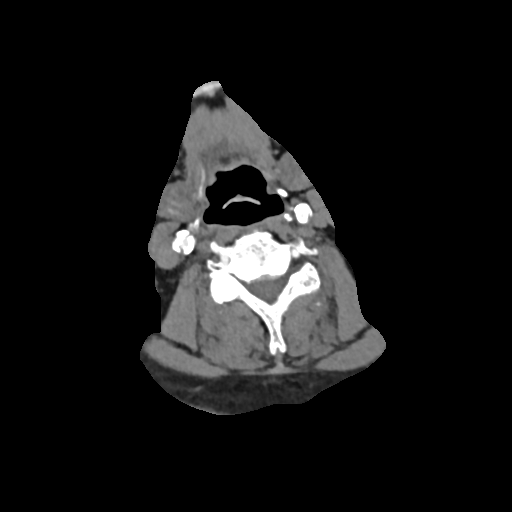
[im 78/136  soft-tissue]
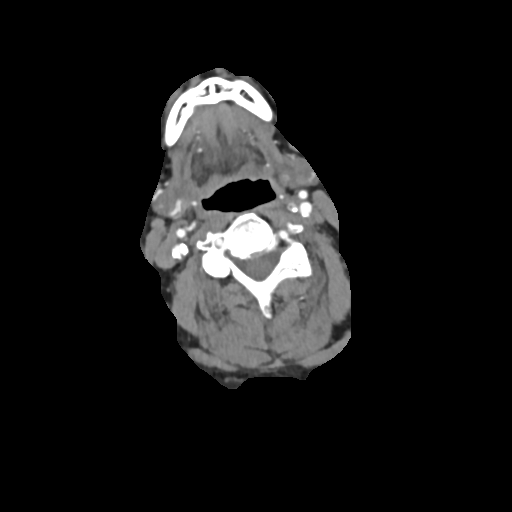
[im 91/136  soft-tissue]
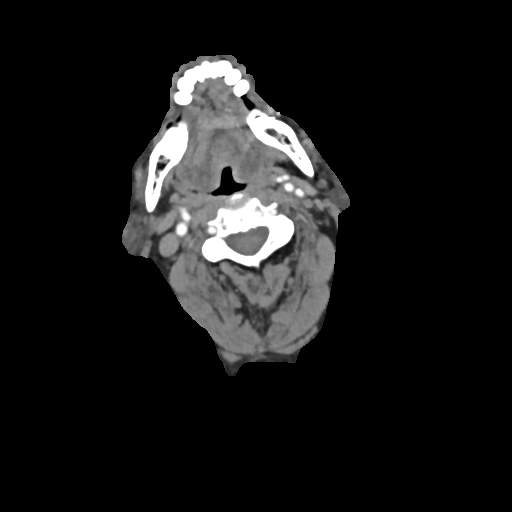
[im 91/136  bone]
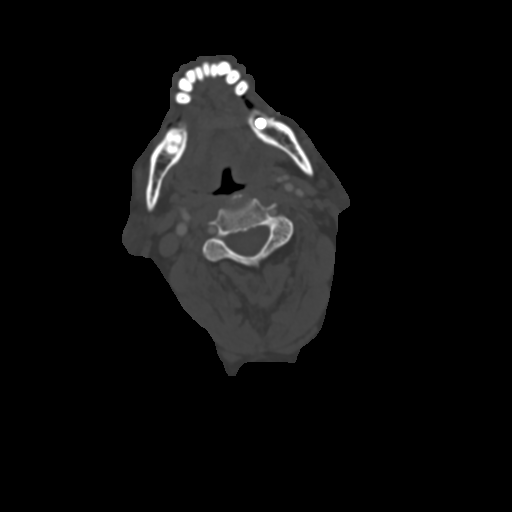
[im 97/136  soft-tissue]
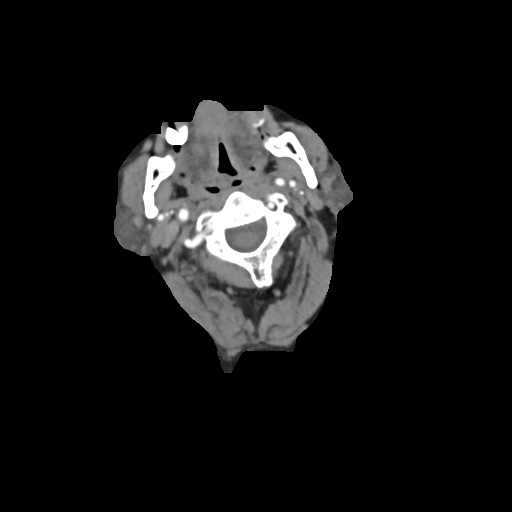
[im 110/136  soft-tissue]
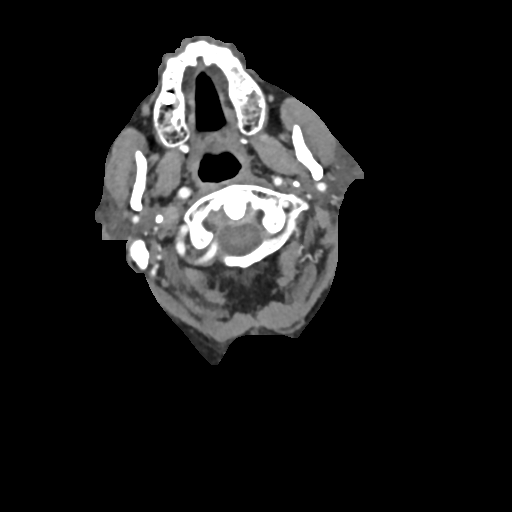
[im 116/136  soft-tissue]
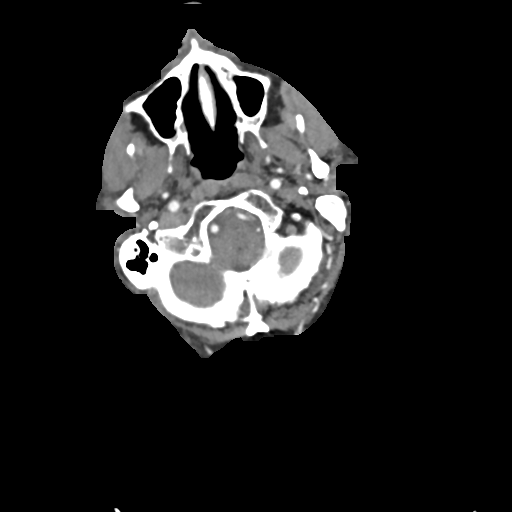
[im 129/136  soft-tissue]
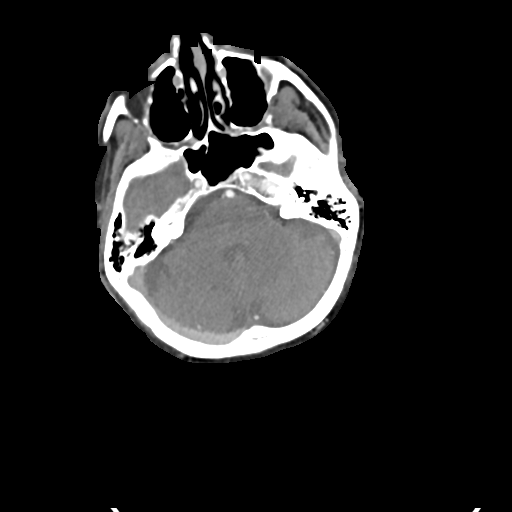

[Series 7: coronal thin · coronal · 0.41mm/px · 3 of 236 slices shown]
[im 68/236  soft-tissue]
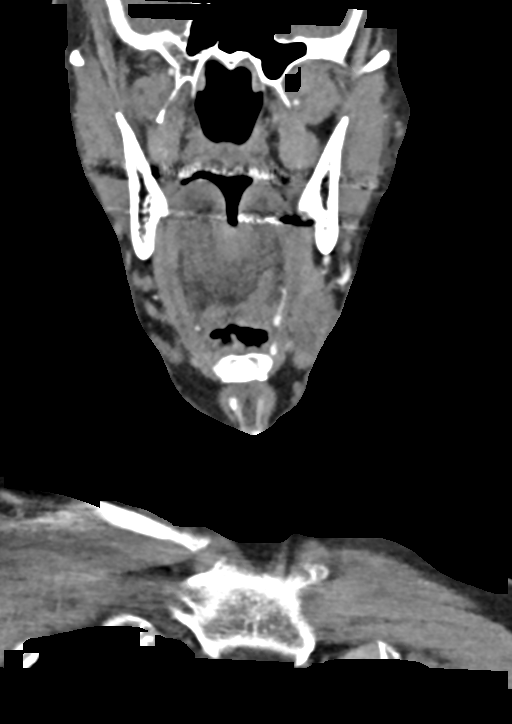
[im 101/236  soft-tissue]
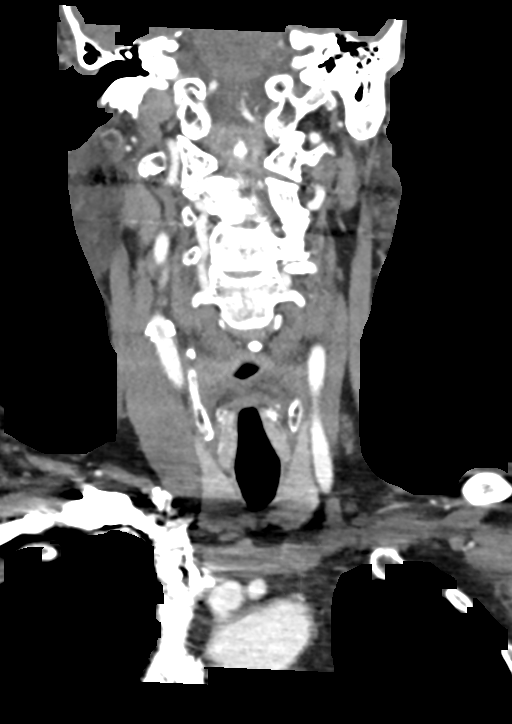
[im 135/236  soft-tissue]
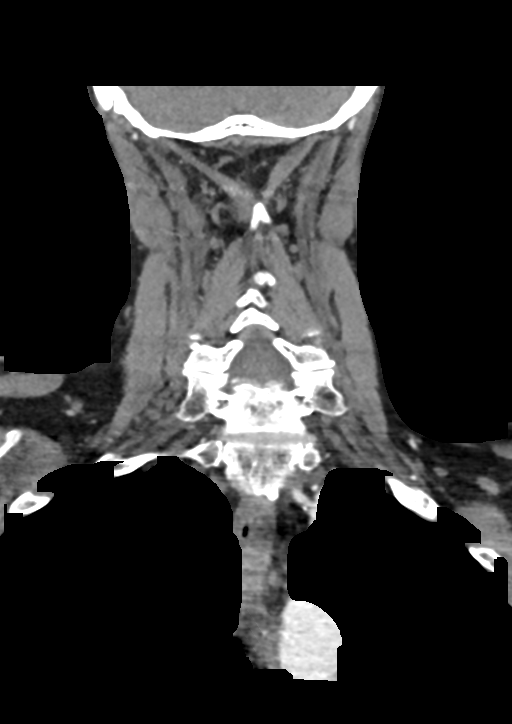

[16 of 46 positions shown; findings below may reference images not displayed]

FINDINGS: Aortic arch: Standard 3 vessel aortic arch with mild atherosclerotic
plaque. Widely patent brachiocephalic and subclavian arteries.

Right carotid system: Patent with extensive calcified plaque at the
carotid bifurcation and in the carotid bulb resulting in 50%
proximal ICA stenosis.

Left carotid system: Patent with extensive calcified plaque at the
carotid bifurcation resulting in 65% proximal ICA stenosis.

Vertebral arteries: Patent with calcified plaque at the right
greater than left vertebral artery origins. No evidence of a
significant stenosis or dissection. Codominant.

Skeleton: No acute osseous abnormality or suspicious osseous lesion.
Moderate lower cervical disc degeneration. Advanced left facet
arthrosis at C3-4.

Other neck: No evidence of cervical lymphadenopathy or mass.

Upper chest: Clear lung apices.
IMPRESSION: 1. 65% proximal left ICA stenosis.
2. 50% proximal right ICA stenosis.
3. Widely patent vertebral arteries.
4. Aortic Atherosclerosis ([UI]-[UI]).

## 2021-04-24 IMAGING — MR MR MRA NECK WO/W CM
4 of 7 series · 17 of 48 positions shown · IV contrast (Yes GAD)
Comparison: Prior MRI from [DATE].

CLINICAL DATA: Follow-up examination for acute stroke.

EXAM:
MRA NECK WITHOUT AND WITH CONTRAST
MRA HEAD WITHOUT CONTRAST
TECHNIQUE: Multiplanar and multiecho pulse sequences of the neck were obtained
without and with intravenous contrast. Angiographic images of the
neck were obtained using MRA technique without and with intravenous
contrast; Angiographic images of the Circle of Willis were obtained
using MRA technique without intravenous contrast.
CONTRAST:  8mL GADAVIST GADOBUTROL 1 MMOL/ML IV SOLN

[Series 300: cor cemra ft · coronal · 1.2mm · 0.59mm/px · 7 of 125 slices shown]
[im 1/125]
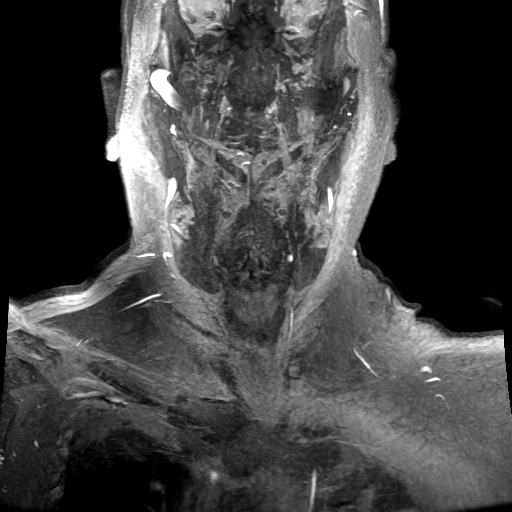
[im 21/125]
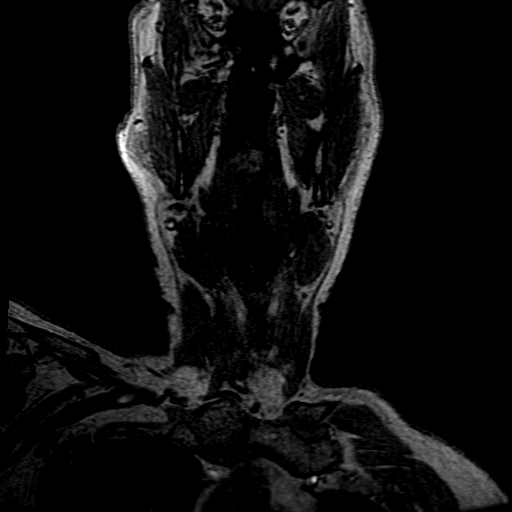
[im 42/125]
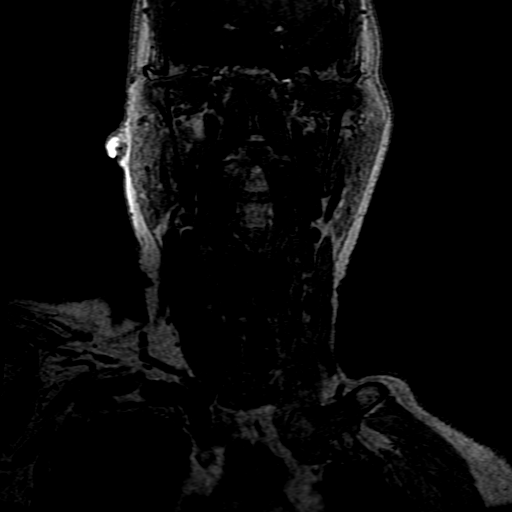
[im 63/125]
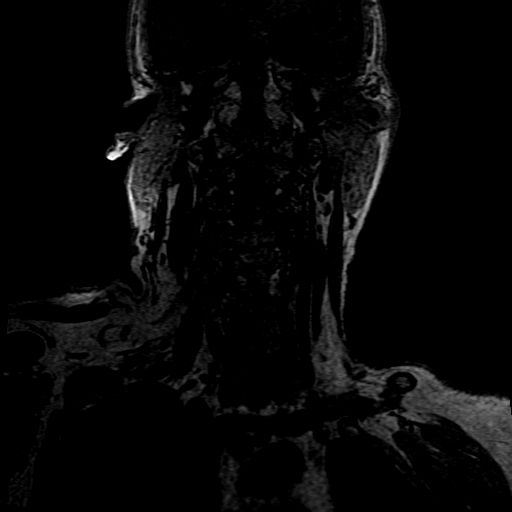
[im 83/125]
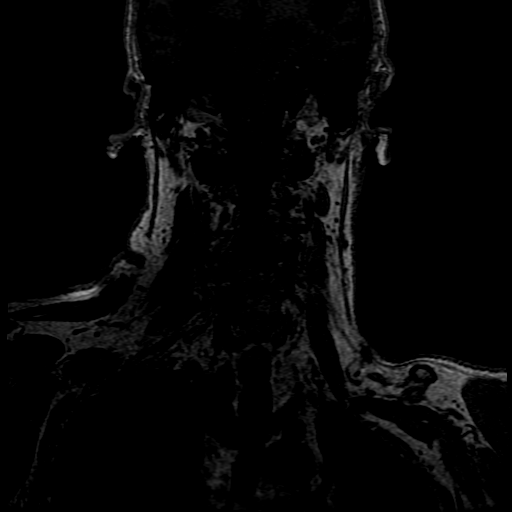
[im 104/125]
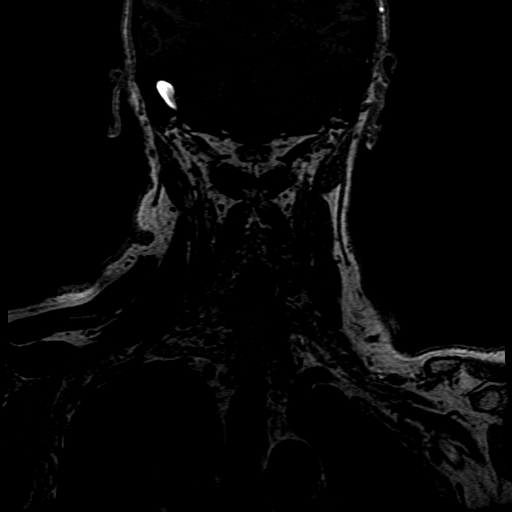
[im 125/125]
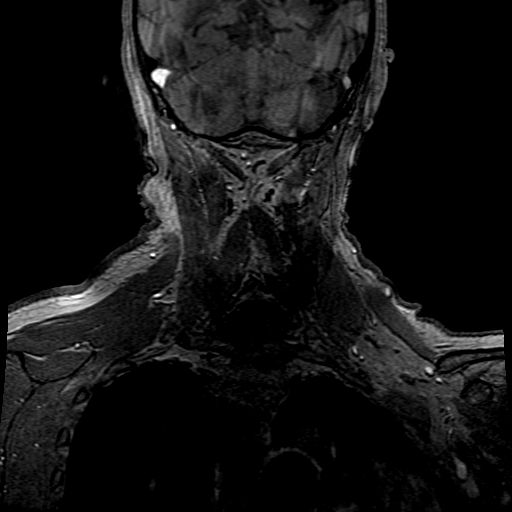

[Series 301: ph1/cor cemra ft · coronal · 1.2mm · 0.59mm/px · 4 of 124 slices shown]
[im 1/124]
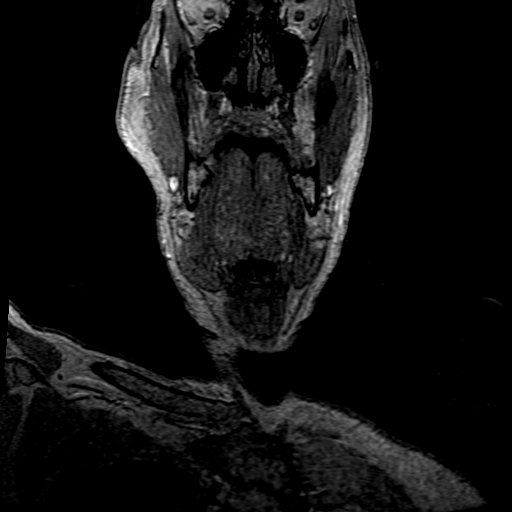
[im 21/124]
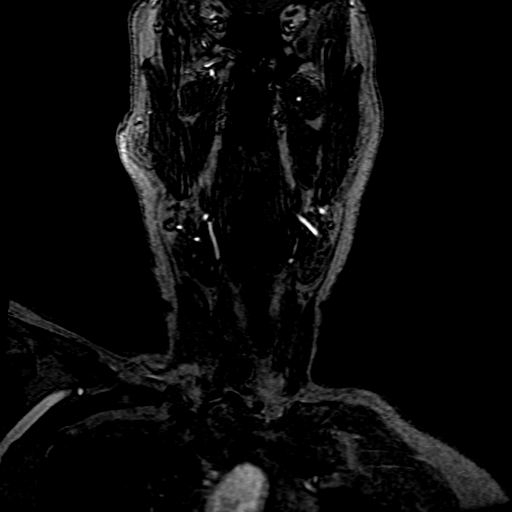
[im 62/124]
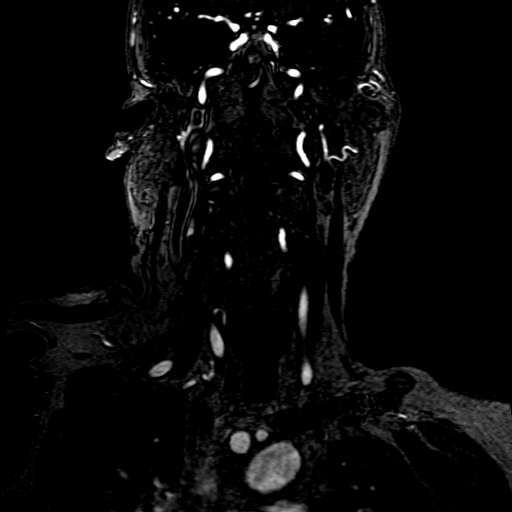
[im 103/124]
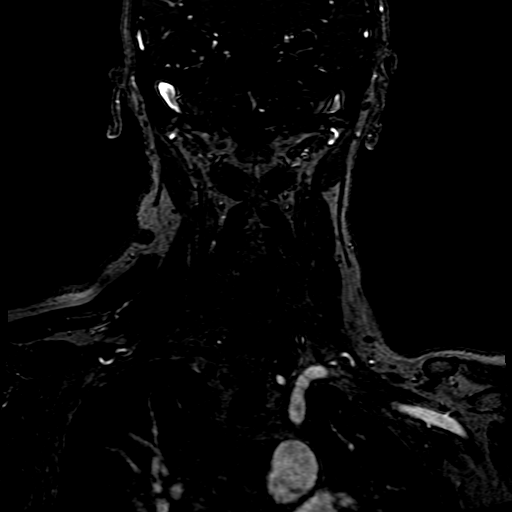

[Series 302: ph2/cor cemra ft · coronal · 1.2mm · 0.59mm/px · 3 of 125 slices shown]
[im 21/125]
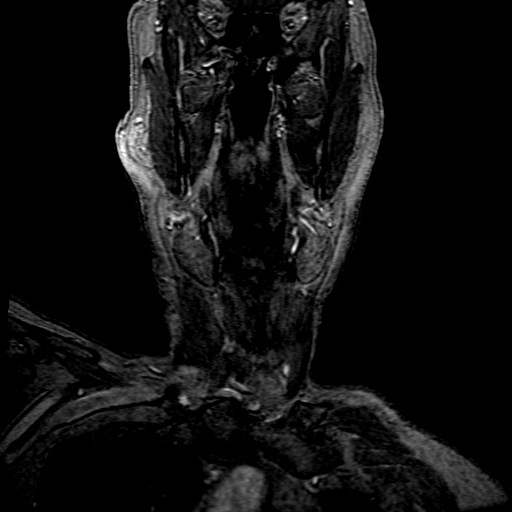
[im 63/125]
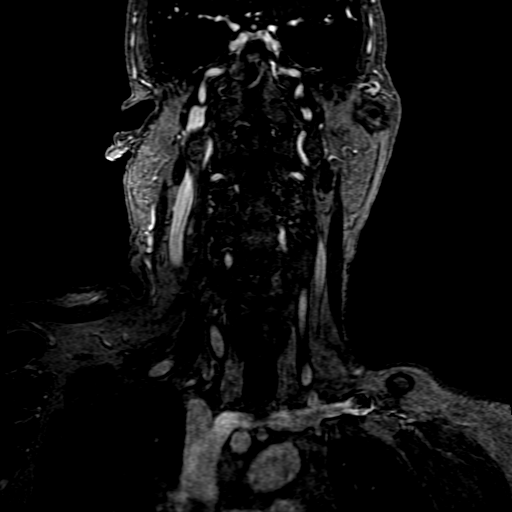
[im 104/125]
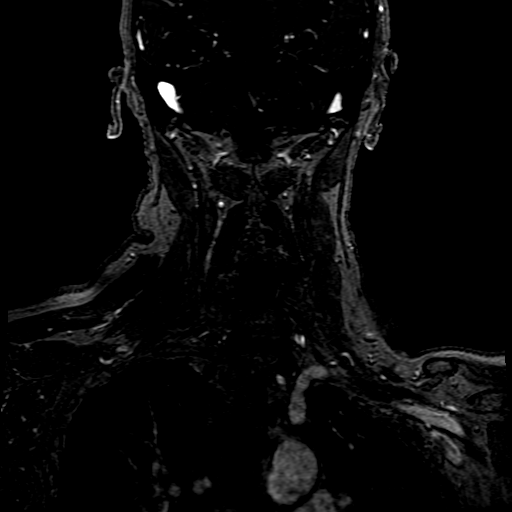

[((date))-((date)) · coronal · 1.2mm · 0.59mm/px · 3 of 125 slices shown]
[im 21/125]
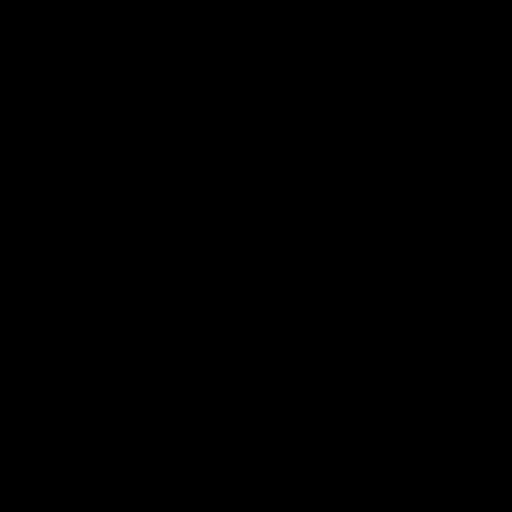
[im 63/125]
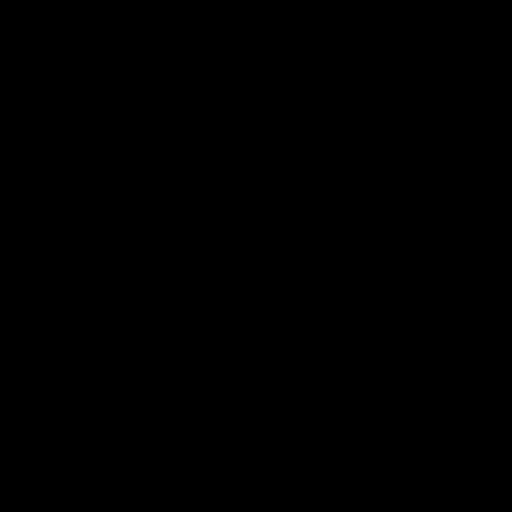
[im 104/125]
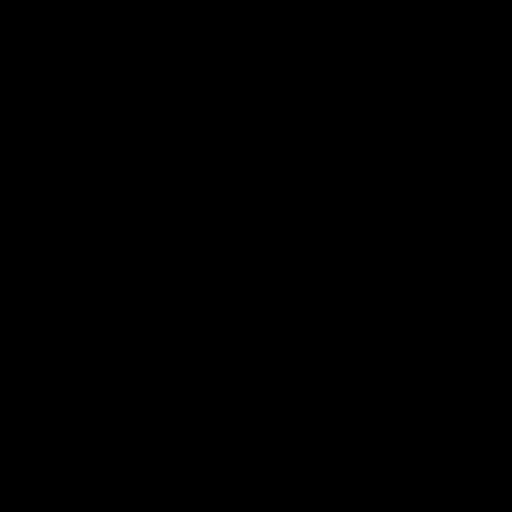

[17 of 48 positions shown; findings below may reference images not displayed]

FINDINGS: MRA NECK FINDINGS

AORTIC ARCH: Visualized aortic arch normal in caliber with normal 3
vessel morphology. No hemodynamically significant stenosis or other
abnormality seen about the origin of the great vessels.

RIGHT CAROTID SYSTEM: Right CCA patent from its origin to the
bifurcation without stenosis. Atheromatous irregularity seen about
the right carotid bulb/proximal right ICA. Associated stenosis of up
to 40% by NASCET criteria. Right ICA otherwise patent distally to
the skull base without stenosis, evidence for dissection or
occlusion.

LEFT CAROTID SYSTEM: Left CCA patent from its origin to the
bifurcation without stenosis. Prominent atheromatous change about
the left carotid bulb/proximal left ICA. Associated severe
short-segment stenosis of up to 80% by NASCET criteria (series 303,
image 5). Left ICA otherwise patent distally to the skull base
without stenosis, evidence for dissection, or occlusion.

VERTEBRAL ARTERIES: Both vertebral arteries arise from the
subclavian arteries. No proximal subclavian artery stenosis.
Vertebral arteries patent within the neck without stenosis, evidence
for dissection or occlusion.

Other: None.

MRA HEAD FINDINGS

ANTERIOR CIRCULATION:

Both internal carotid arteries widely patent to the termini without
stenosis. A1 segments widely patent. Normal anterior communicating
artery complex. Both anterior cerebral arteries widely patent to
their distal aspects without stenosis. No M1 stenosis or occlusion.
Normal MCA bifurcations. Distal MCA branches well perfused and
symmetric.

POSTERIOR CIRCULATION:

Both V4 segments patent to the vertebrobasilar junction without
stenosis. Right vertebral artery slightly dominant. Both PICA
origins patent and normal. Basilar widely patent to its distal
aspect without stenosis. Tiny 2 mm outpouching at the origin of the
right AICA most consistent with a small vascular infundibulum.
Superior cerebellar arteries patent bilaterally. Both PCAs primarily
supplied via the basilar and are well perfused to there distal
aspects. Small right posterior communicating artery noted.

Anatomic variant: None significant.

Other: None.  No intracranial aneurysm.
IMPRESSION: MRA HEAD IMPRESSION:

Normal intracranial MRA. No large vessel occlusion, hemodynamically
significant stenosis, or other acute vascular abnormality.

MRA NECK IMPRESSION:

1. Atheromatous change about the left carotid bulb/proximal left ICA
with associated short-segment severe stenosis of up to 80% by NASCET
criteria.
2. Atheromatous change about the right carotid bulb/proximal right
ICA with associated stenosis of up to 40% by NASCET criteria.
3. Wide patency of both vertebral arteries within the neck.

## 2021-04-24 IMAGING — MR MR MRA HEAD W/O CM
3 series · 17 of 48 positions shown · IV contrast (gadavist)
Comparison: Prior MRI from [DATE].

CLINICAL DATA: Follow-up examination for acute stroke.

EXAM:
MRA NECK WITHOUT AND WITH CONTRAST
MRA HEAD WITHOUT CONTRAST
TECHNIQUE: Multiplanar and multiecho pulse sequences of the neck were obtained
without and with intravenous contrast. Angiographic images of the
neck were obtained using MRA technique without and with intravenous
contrast; Angiographic images of the Circle of Willis were obtained
using MRA technique without intravenous contrast.
CONTRAST:  8mL GADAVIST GADOBUTROL 1 MMOL/ML IV SOLN

[Series 2: ax (id) · axial · 1.0mm · 0.43mm/px · z∈[-68,+17]mm · 15 of 180 slices shown]
[im 1/180]
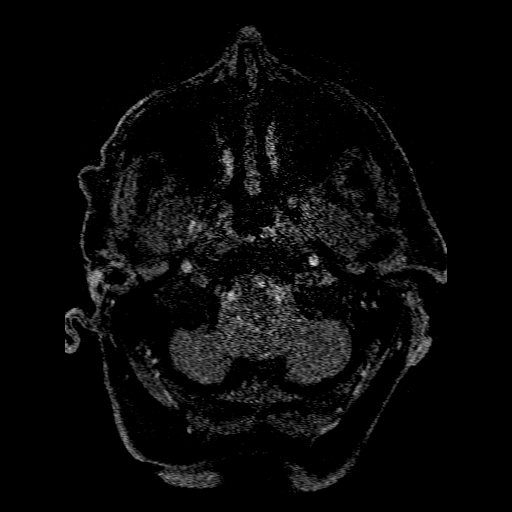
[im 4/180]
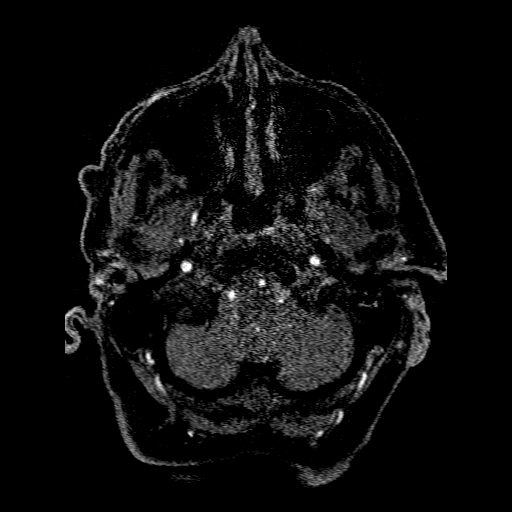
[im 8/180]
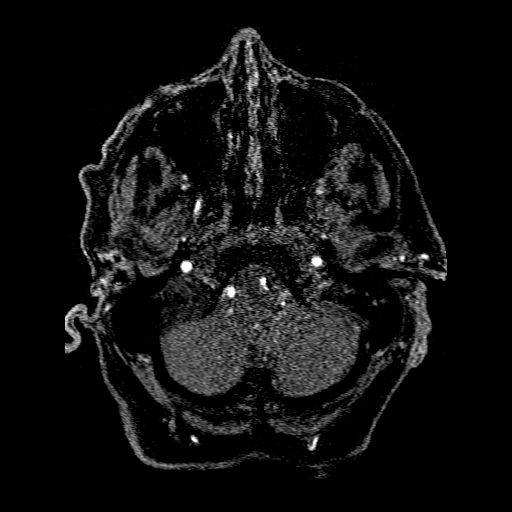
[im 12/180]
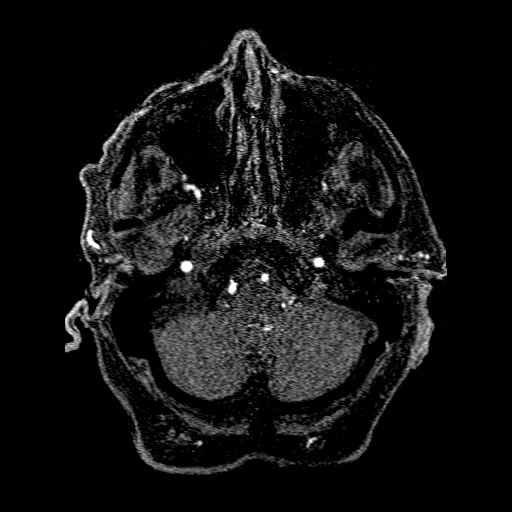
[im 16/180]
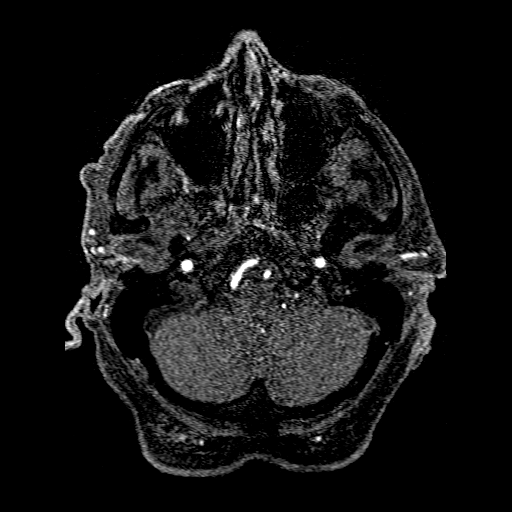
[im 28/180]
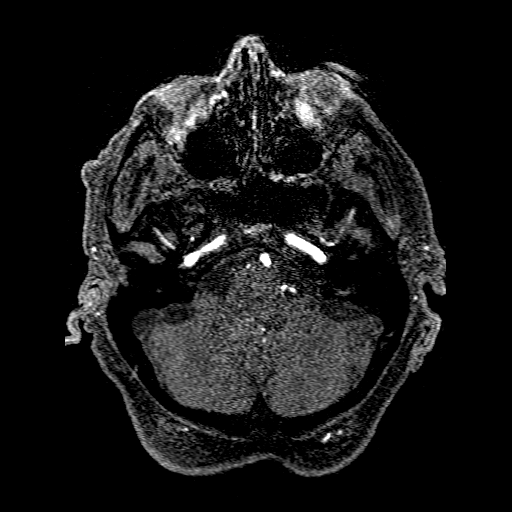
[im 32/180]
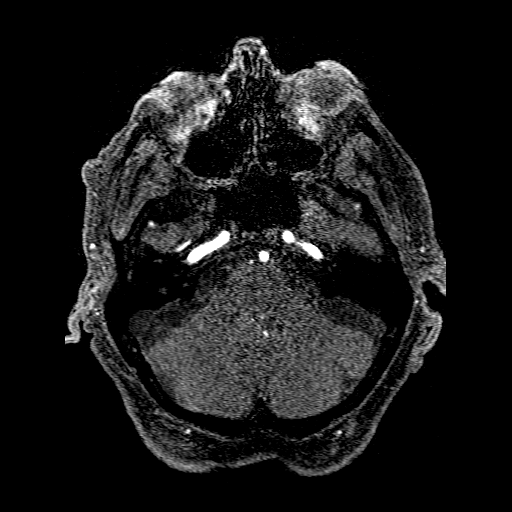
[im 56/180]
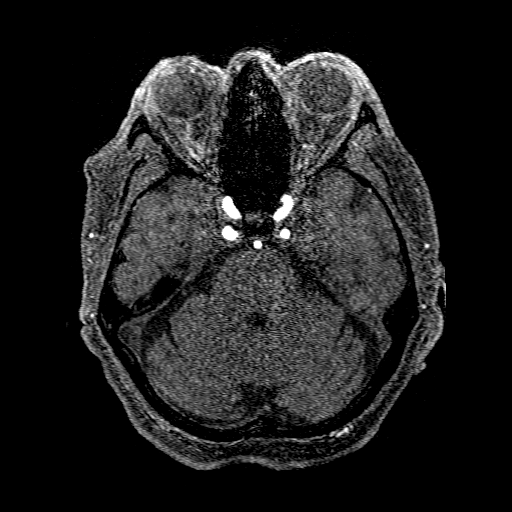
[im 80/180]
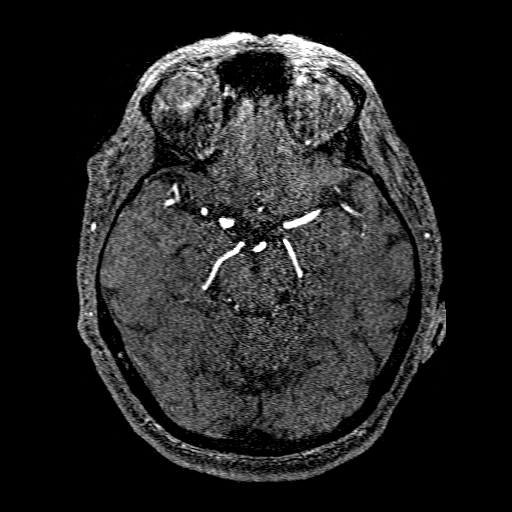
[im 92/180]
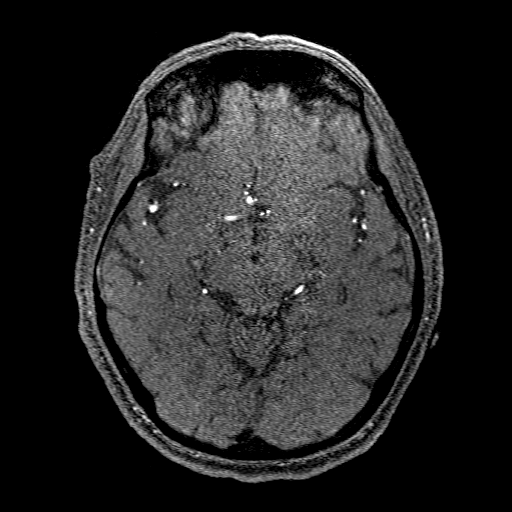
[im 100/180]
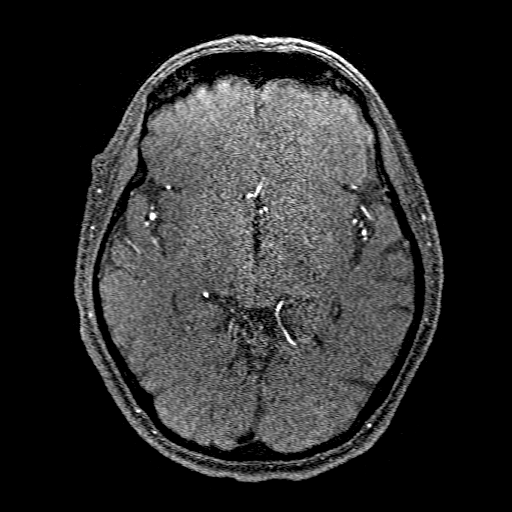
[im 124/180]
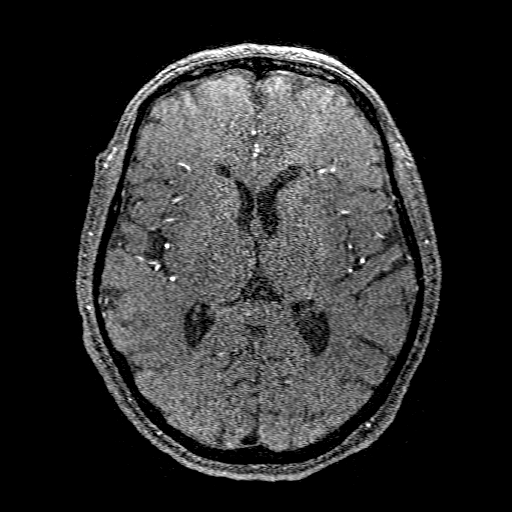
[im 148/180]
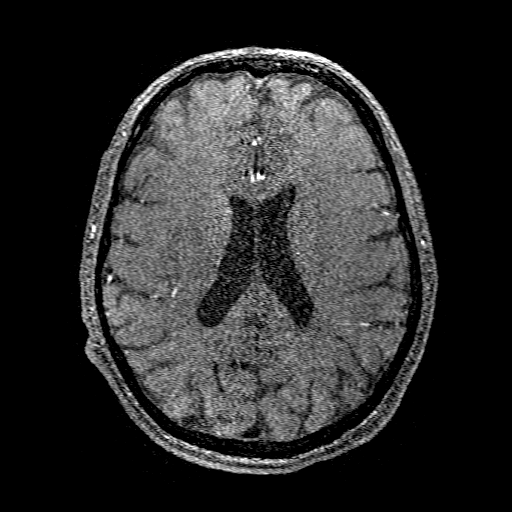
[im 152/180]
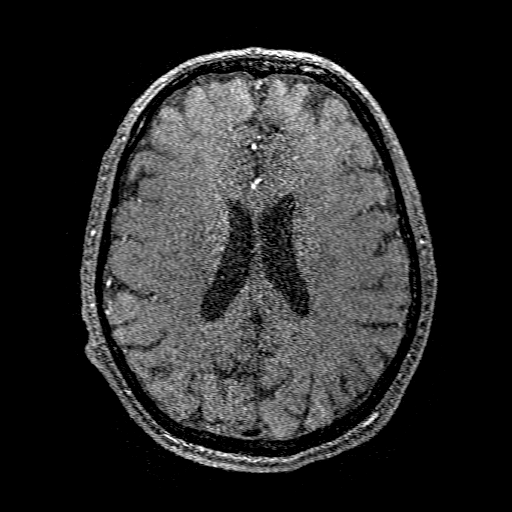
[im 172/180]
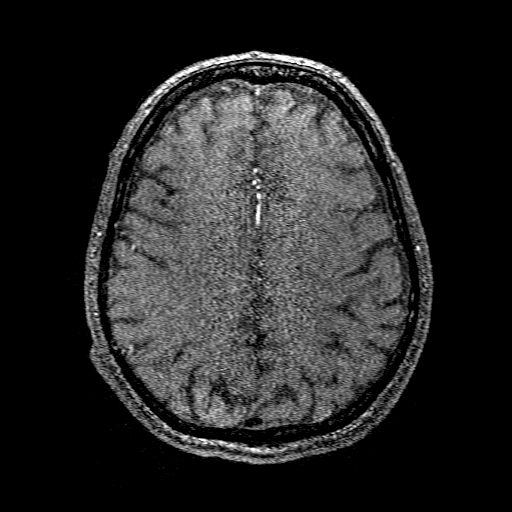

[Series 201: pjn:ax (id) · sagittal · 1.0mm · 0.43mm/px · 1 of 4 slices shown]
[im 1/4]
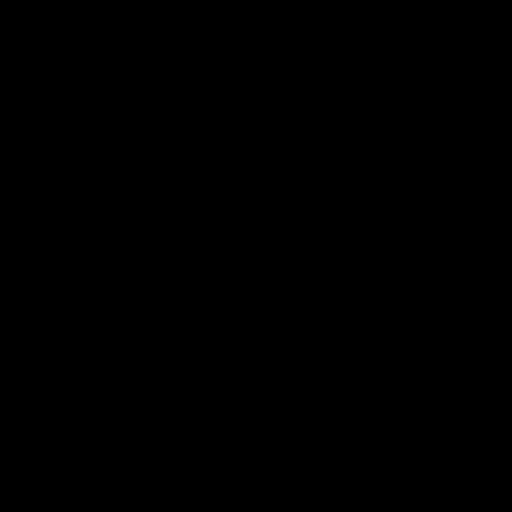

[Series 251: post tumble · axial · 1.0mm · 0.39mm/px · 1 of 3 slices shown]
[im 1/3]
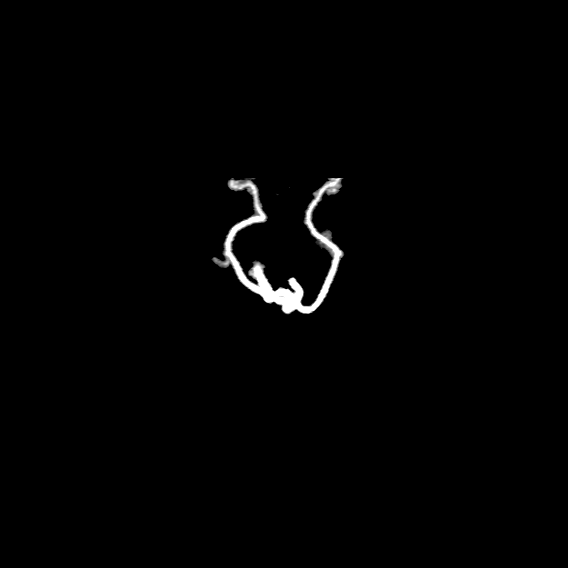

[17 of 48 positions shown; findings below may reference images not displayed]

FINDINGS: MRA NECK FINDINGS

AORTIC ARCH: Visualized aortic arch normal in caliber with normal 3
vessel morphology. No hemodynamically significant stenosis or other
abnormality seen about the origin of the great vessels.

RIGHT CAROTID SYSTEM: Right CCA patent from its origin to the
bifurcation without stenosis. Atheromatous irregularity seen about
the right carotid bulb/proximal right ICA. Associated stenosis of up
to 40% by NASCET criteria. Right ICA otherwise patent distally to
the skull base without stenosis, evidence for dissection or
occlusion.

LEFT CAROTID SYSTEM: Left CCA patent from its origin to the
bifurcation without stenosis. Prominent atheromatous change about
the left carotid bulb/proximal left ICA. Associated severe
short-segment stenosis of up to 80% by NASCET criteria (series 303,
image 5). Left ICA otherwise patent distally to the skull base
without stenosis, evidence for dissection, or occlusion.

VERTEBRAL ARTERIES: Both vertebral arteries arise from the
subclavian arteries. No proximal subclavian artery stenosis.
Vertebral arteries patent within the neck without stenosis, evidence
for dissection or occlusion.

Other: None.

MRA HEAD FINDINGS

ANTERIOR CIRCULATION:

Both internal carotid arteries widely patent to the termini without
stenosis. A1 segments widely patent. Normal anterior communicating
artery complex. Both anterior cerebral arteries widely patent to
their distal aspects without stenosis. No M1 stenosis or occlusion.
Normal MCA bifurcations. Distal MCA branches well perfused and
symmetric.

POSTERIOR CIRCULATION:

Both V4 segments patent to the vertebrobasilar junction without
stenosis. Right vertebral artery slightly dominant. Both PICA
origins patent and normal. Basilar widely patent to its distal
aspect without stenosis. Tiny 2 mm outpouching at the origin of the
right AICA most consistent with a small vascular infundibulum.
Superior cerebellar arteries patent bilaterally. Both PCAs primarily
supplied via the basilar and are well perfused to there distal
aspects. Small right posterior communicating artery noted.

Anatomic variant: None significant.

Other: None.  No intracranial aneurysm.
IMPRESSION: MRA HEAD IMPRESSION:

Normal intracranial MRA. No large vessel occlusion, hemodynamically
significant stenosis, or other acute vascular abnormality.

MRA NECK IMPRESSION:

1. Atheromatous change about the left carotid bulb/proximal left ICA
with associated short-segment severe stenosis of up to 80% by NASCET
criteria.
2. Atheromatous change about the right carotid bulb/proximal right
ICA with associated stenosis of up to 40% by NASCET criteria.
3. Wide patency of both vertebral arteries within the neck.

## 2021-04-24 MED ORDER — AMLODIPINE BESYLATE 5 MG PO TABS
10.0000 mg | ORAL_TABLET | Freq: Every day | ORAL | Status: DC
  Filled 2021-04-24: qty 2

## 2021-04-24 MED ORDER — ATORVASTATIN CALCIUM 40 MG PO TABS: 40.0000 mg | ORAL_TABLET | Freq: Every evening | ORAL | Status: DC

## 2021-04-24 MED ORDER — TRAZODONE HCL 50 MG PO TABS
50.0000 mg | ORAL_TABLET | Freq: Every evening | ORAL | Status: DC | PRN
  Administered 2021-04-24 – 2021-04-26 (×3): 50 mg via ORAL
  Filled 2021-04-24 (×2): qty 1

## 2021-04-24 MED ORDER — HYDRALAZINE HCL 20 MG/ML IJ SOLN
20.0000 mg | INTRAMUSCULAR | Status: DC | PRN
  Filled 2021-04-24 (×2): qty 1

## 2021-04-24 MED ORDER — CARBIDOPA-LEVODOPA 25-100 MG PO TABS
2.5000 | ORAL_TABLET | Freq: Every day | ORAL | Status: DC
  Administered 2021-04-24: 2.5 via ORAL
  Filled 2021-04-24: qty 3

## 2021-04-24 MED ORDER — ATORVASTATIN CALCIUM 10 MG PO TABS
20.0000 mg | ORAL_TABLET | Freq: Every evening | ORAL | Status: DC
  Administered 2021-04-24 – 2021-04-27 (×4): 20 mg via ORAL
  Filled 2021-04-24 (×5): qty 2

## 2021-04-24 MED ORDER — LABETALOL HCL 5 MG/ML IV SOLN
10.0000 mg | INTRAVENOUS | Status: DC | PRN
  Administered 2021-04-24 (×2): 10 mg via INTRAVENOUS
  Filled 2021-04-24 (×2): qty 4

## 2021-04-24 MED ORDER — IRBESARTAN 150 MG PO TABS
75.0000 mg | ORAL_TABLET | Freq: Every day | ORAL | Status: DC
  Administered 2021-04-24 – 2021-04-28 (×5): 75 mg via ORAL
  Filled 2021-04-24 (×6): qty 1

## 2021-04-24 MED ORDER — PANTOPRAZOLE SODIUM 40 MG PO TBEC
40.0000 mg | DELAYED_RELEASE_TABLET | Freq: Every day | ORAL | Status: DC
  Administered 2021-04-24 – 2021-04-28 (×5): 40 mg via ORAL
  Filled 2021-04-24: qty 1
  Filled 2021-04-24: qty 2
  Filled 2021-04-24 (×3): qty 1

## 2021-04-24 MED ORDER — ASPIRIN EC 81 MG PO TBEC
81.0000 mg | DELAYED_RELEASE_TABLET | Freq: Every day | ORAL | Status: DC
  Administered 2021-04-24 – 2021-04-28 (×5): 81 mg via ORAL
  Filled 2021-04-24 (×5): qty 1

## 2021-04-24 MED ORDER — STROKE: EARLY STAGES OF RECOVERY BOOK
Freq: Once | Status: AC
  Filled 2021-04-24: qty 1

## 2021-04-24 MED ORDER — AMLODIPINE BESYLATE 10 MG PO TABS
10.0000 mg | ORAL_TABLET | Freq: Every day | ORAL | Status: DC
  Administered 2021-04-24 – 2021-04-28 (×4): 10 mg via ORAL
  Filled 2021-04-24 (×4): qty 1

## 2021-04-24 MED ORDER — CLOPIDOGREL BISULFATE 75 MG PO TABS
75.0000 mg | ORAL_TABLET | Freq: Every day | ORAL | Status: DC
  Administered 2021-04-24 – 2021-04-28 (×5): 75 mg via ORAL
  Filled 2021-04-24 (×5): qty 1

## 2021-04-24 MED ORDER — ATORVASTATIN CALCIUM 10 MG PO TABS: 20.0000 mg | ORAL_TABLET | Freq: Every evening | ORAL | Status: DC

## 2021-04-24 MED ORDER — POTASSIUM CHLORIDE CRYS ER 20 MEQ PO TBCR
40.0000 meq | EXTENDED_RELEASE_TABLET | Freq: Once | ORAL | Status: AC
  Administered 2021-04-24: 40 meq via ORAL
  Filled 2021-04-24: qty 2

## 2021-04-24 MED ORDER — GADOBUTROL 1 MMOL/ML IV SOLN
8.0000 mL | Freq: Once | INTRAVENOUS | Status: AC | PRN
  Administered 2021-04-24: 8 mL via INTRAVENOUS

## 2021-04-24 MED ORDER — ROTIGOTINE 4 MG/24HR TD PT24
1.0000 | MEDICATED_PATCH | Freq: Every day | TRANSDERMAL | Status: DC
  Administered 2021-04-27: 1 via TRANSDERMAL

## 2021-04-24 MED ORDER — IOHEXOL 350 MG/ML SOLN
75.0000 mL | Freq: Once | INTRAVENOUS | Status: AC | PRN
  Administered 2021-04-24: 75 mL via INTRAVENOUS

## 2021-04-24 MED ORDER — CARBIDOPA-LEVODOPA 25-100 MG PO TABS
2.5000 | ORAL_TABLET | Freq: Four times a day (QID) | ORAL | Status: DC
  Administered 2021-04-24 – 2021-04-28 (×17): 2.5 via ORAL
  Filled 2021-04-24 (×8): qty 2.5
  Filled 2021-04-24: qty 3
  Filled 2021-04-24 (×12): qty 2.5

## 2021-04-24 NOTE — Progress Notes (Addendum)
PROGRESS NOTE    Terry [name]  JXB:[id number] DOB: [birth year] DOA: 04/23/2021 PCP: Georgianne Fick, MD   Brief Narrative:  Terry [name] is a 75 y.o. male with medical history significant for Parkinson's disease, carotid artery stenosis, hypertension, who is admitted to [facility] on 04/23/2021 with subacute ischemic CVA after presenting from home to [facility] complaining of dizziness and evidence of significantly elevated blood pressure since last 4 days.  Upon arrival to ED, his other vitals were stable except his blood pressure which was 241/94, COVID-19/influenza PCR were negative. Noncontrast CT of the head showed no evidence of acute intracranial process, including no evidence of intracranial hemorrhage.  Neurology was consulted, MRI confirmed subacute left frontal stroke.  Assessment & Plan:   Principal Problem:   Stroke [facility]) Active Problems:   Essential hypertension   Parkinson's disease (HCC)   GERD (gastroesophageal reflux disease)   Hypertensive crisis   Headache  Acute ischemic stroke: MRI confirms 8 mm subcortical/deep white matter anterior left frontal lobe infarct.  MR angiogram of neck shows high-grade left ICA stenosis of 80%.  Patient was not on any antiplatelet prior to admission.  It appears that he has been started on aspirin 81 mg p.o. daily and neurology was consulted but no consultation note at this point in time.  I have requested Dr. Roda Shutters to see this patient.  Will defer to neurology for placing orders for DAPT if they recommend so.  Neurology has recommended consulting vascular surgery and I have consulted Dr. Myra Gianotti.  Patient's blood pressure upon arrival was also significantly elevated up to 230/97.  It continues to fluctuate and was 171/86 this morning but up again to 213/89 now.  Per patient's wife who is at the bedside, patient was only on valsartan on but he was seen by his PCP on Friday and they recommended amlodipine 10 mg but for some reason,  prescription was never sent to the pharmacy and thus he has not started that.  I am starting this patient on amlodipine 10 mg and will continue hydralazine as needed to be given if systolic more than 180.  Goal to keep systolic between 130-865.  Echo completed, results pending.  Hypokalemia: 3.4.  Will replace.  Hyperlipidemia: Currently on atorvastatin 20 mg, will continue that.  LDL very low.  HDL low as well.  Parkinson's disease: Continue home medications.  GERD: Continue PPI  DVT prophylaxis: SCDs Start: 04/23/21 2354   Code Status: Full Code  Family Communication: Patient's wife present at bedside.  Plan of care discussed with both of them.  Status is: Observation  The patient will require care spanning > 2 midnights and should be moved to inpatient because: Ongoing diagnostic testing needed not appropriate for outpatient work up  Dispo: The patient is from: Home              Anticipated d/c is to: Home              Patient currently is not medically stable to d/c.   Difficult to place patient No        Estimated body mass index is 24.28 kg/m as calculated from the following:   Height as of this encounter: 6\' 1"  (1.854 m).   Weight as of this encounter: 83.5 kg.     Nutritional Assessment: Body mass index is 24.28 kg/m. Seen by dietician.  I agree with the assessment and plan as outlined below: Nutrition Status:        .  Skin Assessment: I have examined the patient's skin and I agree with the wound assessment as performed by the wound care RN as outlined below:    Consultants:  Neurology Vascular surgery  Procedures:  None  Antimicrobials:  Anti-infectives (From admission, onward)    None          Subjective: Patient seen and examined.  Wife at the bedside.  He states that he is feeling better.  Dizziness is improved.  Does not have any other focal deficit.  Objective: Vitals:   04/24/21 1000 04/24/21 1015 04/24/21 1045 04/24/21 1125   BP: (!) 218/77 (!) 190/79 (!) 197/82 (!) 213/89  Pulse: 80 73 70 71  Resp: Temp:    97.9 F (36.6 C)  TempSrc:    Oral  SpO2: 100% 99% 98% 98%  Weight:      Height:        Intake/Output Summary (Last 24 hours) at 04/24/2021 1142 Last data filed at 04/24/2021 0651 Gross per 24 hour  Intake --  Output 400 ml  Net -400 ml   Filed Weights   04/23/21 2006  Weight: 83.5 kg    Examination:  General exam: Appears calm and comfortable  Respiratory system: Clear to auscultation. Respiratory effort normal. Cardiovascular system: S1 & S2 heard, RRR. No JVD, murmurs, rubs, gallops or clicks. No pedal edema. Gastrointestinal system: Abdomen is nondistended, soft and nontender. No organomegaly or masses felt. Normal bowel sounds heard. Central nervous system: Alert and oriented. No focal neurological deficits. Extremities: Symmetric 5 x 5 power. Skin: No rashes, lesions or ulcers Psychiatry: Judgement and insight appear normal. Mood & affect appropriate.    Data Reviewed: I have personally reviewed following labs and imaging studies  CBC: Recent Labs  Lab 04/20/21 2228 04/23/21 2023 04/24/21 0500  WBC 8.1 8.5 7.7  NEUTROABS  --  5.3  --   HGB 13.4 13.4 12.7*  HCT 40.5 41.2 39.1  MCV 89.6 90.4 90.5  PLT 180 182 166   Basic Metabolic Panel: Recent Labs  Lab 04/20/21 2228 04/23/21 2023 04/24/21 0500  NA 138 139 137  K 4.5 3.9 3.4*  CL 102 105 104  CO2 GLUCOSE 110* 101* 119*  BUN CREATININE 0.95 0.96 0.96  CALCIUM 9.1 8.6* 8.5*  MG  --   --  2.0   GFR: Estimated Creatinine Clearance: 76.3 mL/min (by C-G formula based on SCr of 0.96 mg/dL). Liver Function Tests: Recent Labs  Lab 04/20/21 2228 04/23/21 2023 04/24/21 0500  AST 15 14* 14*  ALT <5 <5 <5  ALKPHOS 55 52 51  BILITOT 1.0 0.7 0.5  PROT 4.5* 6.5 5.9*  ALBUMIN 4.0 4.1 3.8   No results for input(s): LIPASE, AMYLASE in the last 168 hours. No results for input(s):  AMMONIA in the last 168 hours. Coagulation Profile: No results for input(s): INR, PROTIME in the last 168 hours. Cardiac Enzymes: No results for input(s): CKTOTAL, CKMB, CKMBINDEX, TROPONINI in the last 168 hours. BNP (last 3 results) No results for input(s): PROBNP in the last 8760 hours. HbA1C: Recent Labs    04/24/21 0500  HGBA1C 5.4   CBG: No results for input(s): GLUCAP in the last 168 hours. Lipid Profile: Recent Labs    04/24/21 0500  CHOL 91  HDL 37*  LDLCALC 25  TRIG 161  CHOLHDL 2.5   Thyroid Function Tests: No results for input(s): TSH, T4TOTAL, FREET4, T3FREE, THYROIDAB in the  last 72 hours. Anemia Panel: No results for input(s): VITAMINB12, FOLATE, FERRITIN, TIBC, IRON, RETICCTPCT in the last 72 hours. Sepsis Labs: No results for input(s): PROCALCITON, LATICACIDVEN in the last 168 hours.  Recent Results (from the past 240 hour(s))  Resp Panel by RT-PCR (Flu A&B, Covid) Nasopharyngeal Swab     Status: None   Collection Time: 04/24/21 12:35 AM   Specimen: Nasopharyngeal Swab; Nasopharyngeal(NP) swabs in vial transport medium  Result Value Ref Range Status   SARS Coronavirus 2 by RT PCR NEGATIVE NEGATIVE Final    Comment: (NOTE) SARS-CoV-2 target nucleic acids are NOT DETECTED.  The SARS-CoV-2 RNA is generally detectable in upper respiratory specimens during the acute phase of infection. The lowest concentration of SARS-CoV-2 viral copies this assay can detect is 138 copies/mL. A negative result does not preclude SARS-Cov-2 infection and should not be used as the sole basis for treatment or other patient management decisions. A negative result may occur with  improper specimen collection/handling, submission of specimen other than nasopharyngeal swab, presence of viral mutation(s) within the areas targeted by this assay, and inadequate number of viral copies(<138 copies/mL). A negative result must be combined with clinical observations, patient history,  and epidemiological information. The expected result is Negative.  Fact Sheet for Patients:  BloggerCourse.com  Fact Sheet for Healthcare Providers:  SeriousBroker.it  This test is no t yet approved or cleared by the [place] FDA and  has been authorized for detection and/or diagnosis of SARS-CoV-2 by FDA under an Emergency Use Authorization (EUA). This EUA will remain  in effect (meaning this test can be used) for the duration of the COVID-19 declaration under Section 564(b)(1) of the Act, 21 U.S.C.section 360bbb-3(b)(1), unless the authorization is terminated  or revoked sooner.       Influenza A by PCR NEGATIVE NEGATIVE Final   Influenza B by PCR NEGATIVE NEGATIVE Final    Comment: (NOTE) The Xpert Xpress SARS-CoV-2/FLU/RSV plus assay is intended as an aid in the diagnosis of influenza from Nasopharyngeal swab specimens and should not be used as a sole basis for treatment. Nasal washings and aspirates are unacceptable for Xpert Xpress SARS-CoV-2/FLU/RSV testing.  Fact Sheet for Patients: BloggerCourse.com  Fact Sheet for Healthcare Providers: SeriousBroker.it  This test is not yet approved or cleared by the [place] FDA and has been authorized for detection and/or diagnosis of SARS-CoV-2 by FDA under an Emergency Use Authorization (EUA). This EUA will remain in effect (meaning this test can be used) for the duration of the COVID-19 declaration under Section 564(b)(1) of the Act, 21 U.S.C. section 360bbb-3(b)(1), unless the authorization is terminated or revoked.  Performed at [facility], [address] [place], [place] [address]       Radiology Studies: CT HEAD WO CONTRAST ( )  Result Date: 04/23/2021 CLINICAL DATA:  Neuro deficit EXAM: CT HEAD WITHOUT CONTRAST TECHNIQUE: Contiguous axial images were obtained from the base of the skull through  the vertex without intravenous contrast. COMPARISON:  04/20/2021 FINDINGS: Brain: No evidence of acute infarction, hemorrhage, hydrocephalus, extra-axial collection or mass lesion/mass effect. Mild atrophic changes are noted. Vascular: No hyperdense vessel or unexpected calcification. Skull: Normal. Negative for fracture or focal lesion. Sinuses/Orbits: No acute finding. Other: None. IMPRESSION: No acute intracranial abnormality noted. Electronically Signed   By: Alcide Clever M.D.   On: 04/23/2021 21:05   MR ANGIO HEAD WO CONTRAST  Result Date: 04/24/2021 CLINICAL DATA:  Follow-up examination for acute stroke. EXAM: MRA NECK WITHOUT AND WITH  CONTRAST MRA HEAD WITHOUT CONTRAST TECHNIQUE: Multiplanar and multiecho pulse sequences of the neck were obtained without and with intravenous contrast. Angiographic images of the neck were obtained using MRA technique without and with intravenous contrast; Angiographic images of the Circle of Willis were obtained using MRA technique without intravenous contrast. CONTRAST:  8mL GADAVIST GADOBUTROL 1 MMOL/ML IV SOLN COMPARISON:  Prior MRI from 04/23/2021. FINDINGS: MRA NECK FINDINGS AORTIC ARCH: Visualized aortic arch normal in caliber with normal 3 vessel morphology. No hemodynamically significant stenosis or other abnormality seen about the origin of the great vessels. RIGHT CAROTID SYSTEM: Right CCA patent from its origin to the bifurcation without stenosis. Atheromatous irregularity seen about the right carotid bulb/proximal right ICA. Associated stenosis of up to 40% by NASCET criteria. Right ICA otherwise patent distally to the skull base without stenosis, evidence for dissection or occlusion. LEFT CAROTID SYSTEM: Left CCA patent from its origin to the bifurcation without stenosis. Prominent atheromatous change about the left carotid bulb/proximal left ICA. Associated severe short-segment stenosis of up to 80% by NASCET criteria (series 303, image 5). Left ICA otherwise  patent distally to the skull base without stenosis, evidence for dissection, or occlusion. VERTEBRAL ARTERIES: Both vertebral arteries arise from the subclavian arteries. No proximal subclavian artery stenosis. Vertebral arteries patent within the neck without stenosis, evidence for dissection or occlusion. Other: None. MRA HEAD FINDINGS ANTERIOR CIRCULATION: Both internal carotid arteries widely patent to the termini without stenosis. A1 segments widely patent. Normal anterior communicating artery complex. Both anterior cerebral arteries widely patent to their distal aspects without stenosis. No M1 stenosis or occlusion. Normal MCA bifurcations. Distal MCA branches well perfused and symmetric. POSTERIOR CIRCULATION: Both V4 segments patent to the vertebrobasilar junction without stenosis. Right vertebral artery slightly dominant. Both PICA origins patent and normal. Basilar widely patent to its distal aspect without stenosis. Tiny 2 mm outpouching at the origin of the right AICA most consistent with a small vascular infundibulum. Superior cerebellar arteries patent bilaterally. Both PCAs primarily supplied via the basilar and are well perfused to there distal aspects. Small right posterior communicating artery noted. Anatomic variant: None significant. Other: None.  No intracranial aneurysm. IMPRESSION: MRA HEAD IMPRESSION: Normal intracranial MRA. No large vessel occlusion, hemodynamically significant stenosis, or other acute vascular abnormality. MRA NECK IMPRESSION: 1. Atheromatous change about the left carotid bulb/proximal left ICA with associated short-segment severe stenosis of up to 80% by NASCET criteria. 2. Atheromatous change about the right carotid bulb/proximal right ICA with associated stenosis of up to 40% by NASCET criteria. 3. Wide patency of both vertebral arteries within the neck. Electronically Signed   By: Rise Mu M.D.   On: 04/24/2021 05:54   MR ANGIO NECK W WO  CONTRAST  Result Date: 04/24/2021 CLINICAL DATA:  Follow-up examination for acute stroke. EXAM: MRA NECK WITHOUT AND WITH CONTRAST MRA HEAD WITHOUT CONTRAST TECHNIQUE: Multiplanar and multiecho pulse sequences of the neck were obtained without and with intravenous contrast. Angiographic images of the neck were obtained using MRA technique without and with intravenous contrast; Angiographic images of the Circle of Willis were obtained using MRA technique without intravenous contrast. CONTRAST:  8mL GADAVIST GADOBUTROL 1 MMOL/ML IV SOLN COMPARISON:  Prior MRI from 04/23/2021. FINDINGS: MRA NECK FINDINGS AORTIC ARCH: Visualized aortic arch normal in caliber with normal 3 vessel morphology. No hemodynamically significant stenosis or other abnormality seen about the origin of the great vessels. RIGHT CAROTID SYSTEM: Right CCA patent from its origin to the bifurcation without stenosis. Atheromatous irregularity  seen about the right carotid bulb/proximal right ICA. Associated stenosis of up to 40% by NASCET criteria. Right ICA otherwise patent distally to the skull base without stenosis, evidence for dissection or occlusion. LEFT CAROTID SYSTEM: Left CCA patent from its origin to the bifurcation without stenosis. Prominent atheromatous change about the left carotid bulb/proximal left ICA. Associated severe short-segment stenosis of up to 80% by NASCET criteria (series 303, image 5). Left ICA otherwise patent distally to the skull base without stenosis, evidence for dissection, or occlusion. VERTEBRAL ARTERIES: Both vertebral arteries arise from the subclavian arteries. No proximal subclavian artery stenosis. Vertebral arteries patent within the neck without stenosis, evidence for dissection or occlusion. Other: None. MRA HEAD FINDINGS ANTERIOR CIRCULATION: Both internal carotid arteries widely patent to the termini without stenosis. A1 segments widely patent. Normal anterior communicating artery complex. Both anterior  cerebral arteries widely patent to their distal aspects without stenosis. No M1 stenosis or occlusion. Normal MCA bifurcations. Distal MCA branches well perfused and symmetric. POSTERIOR CIRCULATION: Both V4 segments patent to the vertebrobasilar junction without stenosis. Right vertebral artery slightly dominant. Both PICA origins patent and normal. Basilar widely patent to its distal aspect without stenosis. Tiny 2 mm outpouching at the origin of the right AICA most consistent with a small vascular infundibulum. Superior cerebellar arteries patent bilaterally. Both PCAs primarily supplied via the basilar and are well perfused to there distal aspects. Small right posterior communicating artery noted. Anatomic variant: None significant. Other: None.  No intracranial aneurysm. IMPRESSION: MRA HEAD IMPRESSION: Normal intracranial MRA. No large vessel occlusion, hemodynamically significant stenosis, or other acute vascular abnormality. MRA NECK IMPRESSION: 1. Atheromatous change about the left carotid bulb/proximal left ICA with associated short-segment severe stenosis of up to 80% by NASCET criteria. 2. Atheromatous change about the right carotid bulb/proximal right ICA with associated stenosis of up to 40% by NASCET criteria. 3. Wide patency of both vertebral arteries within the neck. Electronically Signed   By: Rise Mu M.D.   On: 04/24/2021 05:54   MR BRAIN WO CONTRAST  Result Date: 04/24/2021 CLINICAL DATA:  Initial evaluation for acute dizziness. EXAM: MRI HEAD WITHOUT CONTRAST TECHNIQUE: Multiplanar, multiecho pulse sequences of the brain and surrounding structures were obtained without intravenous contrast. COMPARISON:  Head CT from earlier the same day. FINDINGS: Brain: Cerebral volume within normal limits for age. Few scattered patchy subcentimeter T2/FLAIR hyperintensity noted about the periventricular and deep white matter both cerebral hemispheres, nonspecific, but most commonly related to  chronic microvascular ischemic disease. Overall, changes are mild for age. Probable small remote lacunar infarct noted within the ventral right thalamus. Additional tiny remote lacunar infarct present at the right basal ganglia. 8 mm focus of mild diffusion signal seen involving the subcortical/deep white matter of the anterior left frontal lobe (series 5, image 85). Associated prominent T2/FLAIR signal abnormality (series 11, image 20). No associated ADC correlate. Finding is nonspecific, but favored to reflect changes of evolving late subacute small vessel ischemia. No associated hemorrhage or mass effect. No other diffusion abnormality to suggest acute or subacute ischemia elsewhere within the brain. Gray-white matter differentiation otherwise maintained. No encephalomalacia to suggest chronic cortical infarction. No evidence for acute or chronic intracranial hemorrhage. No mass lesion, midline shift or mass effect. No hydrocephalus or extra-axial fluid collection. Pituitary gland suprasellar region within normal limits. Midline structures intact and normal. Vascular: Major intracranial vascular flow voids are maintained. Skull and upper cervical spine: Craniocervical junction within normal limits. Bone marrow signal intensity normal. No scalp  soft tissue abnormality. Sinuses/Orbits: Patient status post bilateral ocular lens replacement. Globes and orbital soft tissues demonstrate no acute finding. Paranasal sinuses are clear. Small left mastoid effusion noted. Inner ear structures grossly normal. Negative nasopharynx. Other: None. IMPRESSION: 1. 8 mm focus of mild diffusion signal abnormality involving the subcortical/deep white matter of the anterior left frontal lobe, nonspecific, but favored to reflect changes of evolving late subacute small vessel ischemia. No associated hemorrhage or mass effect. 2. No other acute intracranial abnormality. 3. Mild for age chronic microvascular ischemic disease with small  remote lacunar infarcts involving the right thalamus and right basal ganglia. Electronically Signed   By: Rise Mu M.D.   On: 04/24/2021 01:28    Scheduled Meds:  amLODipine  10 mg Oral Daily   aspirin EC  81 mg Oral Daily   atorvastatin  20 mg Oral QPM   carbidopa-levodopa  1 tablet Oral QHS   carbidopa-levodopa  2.5 tablet Oral QID   irbesartan  75 mg Oral Daily   pantoprazole  40 mg Oral Daily   rotigotine  1 patch Transdermal Daily   Continuous Infusions:   LOS: 0 days   Time spent: 36 minutes   Hughie Closs, MD [facility]  04/24/2021, 11:42 AM  Please page via Loretha Stapler and do not message via secure chat for anything urgent. Secure chat can be used for anything non urgent.  How to contact the [facility] Attending or Consulting provider 7A - 7P or covering provider during after hours 7P -7A, for this patient?  Check the care team in [facility] and look for a) attending/consulting [facility] provider listed and b) the [facility] team listed. Page or secure chat 7A-7P. Log into [email or web address] and use 's universal password to access. If you do not have the password, please contact the hospital operator. Locate the [facility] provider you are looking for under [facility] and page to a number that you can be directly reached. If you still have difficulty reaching the provider, please page the [facility] (Director on Call) for the Hospitalists listed on amion for assistance.

## 2021-04-24 NOTE — TOC Initial Note (Signed)
Transition of Care [facility]) - Initial/Assessment Note    Patient Details  Name: Terry [name] MRN: [id number] Date of Birth: [birth year]  Transition of Care [facility]) CM/SW Contact:    Lockie Pares, RN Phone Number: 04/24/2021, 2:57 PM  Clinical Narrative:                 75 year old patient presented to ED yesterday with stroke like symptoms. Has a history of parkinson's disease,, is seen by outpatient rehab neuro.MRI reveals laclunar infarct.   Patient feeling like he has garbled speech, PT assessed and recommended continuing outpatient treatment.  Plan will be to return home and continue outpatient neuro rehab.   CM will follow for needs, recommendations and transitions  Expected Discharge Plan: Home/Self Care Barriers to Discharge: Continued Medical Work up   Patient Goals and CMS Choice        Expected Discharge Plan and Services Expected Discharge Plan: Home/Self Care       Living arrangements for the past 2 months: Single Family Home                                      Prior Living Arrangements/Services Living arrangements for the past 2 months: Single Family Home Lives with:: Spouse Patient language and need for interpreter reviewed:: Yes        Need for Family Participation in Patient Care: Yes (Comment) Care giver support system in place?: Yes (comment) Current home services: DME (cane walker grab bars in shower) Criminal Activity/Legal Involvement Pertinent to Current Situation/Hospitalization: No - Comment as needed  Activities of Daily Living      Permission Sought/Granted                  Emotional Assessment       Orientation: : Oriented to Self, Oriented to Place, Oriented to  Time, Oriented to Situation Alcohol / Substance Use: Not Applicable Psych Involvement: No (comment)  Admission diagnosis:  Hypertensive crisis [I16.9] Patient Active Problem List   Diagnosis Date Noted   Stroke (HCC) 04/24/2021   Headache 04/24/2021    Hypertensive crisis 04/23/2021   Chest tightness 10/09/2019   Shortness of breath 10/09/2019   Erectile dysfunction 03/12/2019   Essential hypertension 04/29/2018   Need for immunization against influenza 04/29/2018   Parkinson's disease (HCC) 04/29/2018   Carotid artery stenosis 04/29/2018   HLD (hyperlipidemia) 04/29/2018   GERD (gastroesophageal reflux disease) 04/29/2018   Colon cancer screening 04/29/2018   Chronic tension headaches 04/29/2018   PCP:  Georgianne Fick, MD Pharmacy:   [id number] [place], [place] - [address] [place] [address] Phone: [phone] Fax: [phone]     Social Determinants of Health (SDOH) Interventions    Readmission Risk Interventions No flowsheet data found.

## 2021-04-24 NOTE — ED Notes (Signed)
No slurred speech noted, pt has no problems finding words, pt states he feels like has has been having some slurred speech and slowness in finding words for the past few weeks.

## 2021-04-24 NOTE — ED Notes (Signed)
Placed Breakfast order 

## 2021-04-24 NOTE — ED Notes (Signed)
NP at bedside.

## 2021-04-24 NOTE — Consult Note (Signed)
[facility] and [facility]in Specialist of [facility]  Patient name: Terry [name] MRN: [id number] DOB: 02-24-1946 Sex: male   REQUESTING PROVIDER:   Hospital service   REASON FOR CONSULT:    Symptomatic left carotid stenosis  HISTORY OF PRESENT ILLNESS:   Terry [name] is a 75 y.o. male, who presented to the emergency department with a 4-day history of dizziness and a left-sided headache.  He also noticed some difficulty getting his words out.  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  He denies any other neurological symptoms.  The patient takes a statin for hypercholesterolemia.  He is medically managed for hypertension with an ARB.  He does have a history of Parkinson's disease.  PAST MEDICAL HISTORY    Past Medical History:  Diagnosis Date   Carotid artery stenosis 04/29/2018   Chronic tension headaches 04/29/2018   Essential hypertension 04/29/2018   GERD (gastroesophageal reflux disease) 04/29/2018   HLD (hyperlipidemia) 04/29/2018   Parkinson's disease (HCC) 04/29/2018     FAMILY HISTORY   Family History  Problem Relation Age of Onset   Heart disease Mother    Cancer Father     SOCIAL HISTORY:   Social History   Socioeconomic History   Marital status: Widowed    Spouse name: Not on file   Number of children: Not on file   Years of education: Not on file   Highest education level: Not on file  Occupational History   Not on file  Tobacco Use   Smoking status: Never   Smokeless tobacco: Current    Types: Snuff  Substance and Sexual Activity   Alcohol use: Yes    Comment: Occasionally.   Drug use: Never   Sexual activity: Not on file  Other Topics Concern   Not on file  Social History Narrative   Not on file   Social Determinants of Health   Financial Resource Strain: Not on file  Food Insecurity: Not on file  Transportation Needs: Not on file   Physical Activity: Not on file  Stress: Not on file  Social Connections: Not on file  Intimate Partner Violence: Not on file    ALLERGIES:    Allergies  Allergen Reactions   Penicillins Rash   Erythromycin Rash   Calm Nausea And Vomiting    CURRENT MEDICATIONS:    Current Facility-Administered Medications  Medication Dose Route Frequency Provider Last Rate Last Admin   acetaminophen (TYLENOL) tablet 650 mg  650 mg Oral Q6H PRN Howerter, Justin B, DO       Or   acetaminophen (TYLENOL) suppository 650 mg  650 mg Rectal Q6H PRN Howerter, Justin B, DO       amLODipine (NORVASC) tablet 10 mg  10 mg Oral Daily Pahwani, Daleen Bo, MD   10 mg at 04/24/21 1010   aspirin EC tablet 81 mg  81 mg Oral Daily Howerter, Justin B, DO   81 mg at 04/24/21 1011   atorvastatin (LIPITOR) tablet 20 mg  20 mg Oral QPM Pahwani, Daleen Bo, MD   20 mg at 04/24/21 1739   carbidopa-levodopa (SINEMET CR) 50-200 MG per tablet controlled release 1 tablet  1 tablet Oral QHS Howerter, Justin B, DO   1 tablet at 04/24/21 0037   carbidopa-levodopa (SINEMET IR) 25-100 MG per tablet immediate release 2.5 tablet  2.5 tablet Oral QID Howerter, Justin B, DO   2.5 tablet at 04/24/21 1741   clopidogrel (PLAVIX) tablet  75 mg  75 mg Oral Daily Marvel Plan, MD   75 mg at 04/24/21 1520   hydrALAZINE (APRESOLINE) injection 20 mg  20 mg Intravenous Q4H PRN Bailey-Modzik, Delila A, NP       irbesartan (AVAPRO) tablet 75 mg  75 mg Oral Daily Howerter, Justin B, DO   75 mg at 04/24/21 1012   labetalol (NORMODYNE) injection 10 mg  10 mg Intravenous Q4H PRN Howerter, Justin B, DO   10 mg at 04/24/21 1757   pantoprazole (PROTONIX) EC tablet 40 mg  40 mg Oral Daily Howerter, Justin B, DO   40 mg at 04/24/21 1012   rotigotine (NEUPRO) 4 MG/24HR 1 patch  1 patch Transdermal Daily Howerter, Justin B, DO       Current Outpatient Medications  Medication Sig Dispense Refill   acetaminophen (TYLENOL) 500 MG tablet Take 500 mg by mouth every 6 (six)  hours as needed for moderate pain or headache.     atorvastatin (LIPITOR) 20 MG tablet TAKE ONE TABLET BY MOUTH EVERY NIGHT AT BEDTIME (Patient taking differently: Take 20 mg by mouth every evening.) 90 tablet 0   betamethasone acetate-betamethasone sodium phosphate (CELESTONE) 6 (3-3) MG/ML injection Every 6 months     carbidopa-levodopa (SINEMET CR) 50-200 MG tablet Take 1 tablet by mouth at bedtime. 90 tablet 1   carbidopa-levodopa (SINEMET IR) 25-100 MG tablet Take 2.5 tablets by mouth 4 (four) times daily. 7am, 12pm, 5pm, 10pm     cetirizine (ZYRTEC) 10 MG tablet Take 10 mg by mouth as needed for allergies.      fluticasone (FLONASE) 50 MCG/ACT nasal spray Place 1 spray into both nostrils daily as needed for allergies.      meloxicam (MOBIC) 15 MG tablet Take 15 mg by mouth at bedtime as needed for pain.     NEUPRO 4 MG/24HR Place 1 patch onto the skin daily.     nitroGLYCERIN (NITROSTAT) 0.4 MG SL tablet Place 1 tablet (0.4 mg total) under the tongue every 5 (five) minutes as needed for chest pain. 90 tablet 3   omeprazole (PRILOSEC) 40 MG capsule Take 1 capsule (40 mg total) by mouth daily. 90 capsule 0   tiZANidine (ZANAFLEX) 2 MG tablet Take 2 mg by mouth daily as needed for muscle spasms.     traZODone (DESYREL) 50 MG tablet Take 1 tablet (50 mg total) by mouth at bedtime as needed for sleep. 90 tablet 0   valsartan (DIOVAN) 80 MG tablet Take 80 mg by mouth daily.     amLODipine (NORVASC) 10 MG tablet Take 1 tablet (10 mg total) by mouth daily. (Patient not taking: Reported on 04/23/2021) 90 tablet 2   Tadalafil (CIALIS) 2.5 MG TABS Take 1 tablet (2.5 mg total) by mouth daily. (Patient not taking: Reported on 04/23/2021) 30 tablet 1   valsartan (DIOVAN) 160 MG tablet Take 1 tablet (160 mg total) by mouth daily. (Patient not taking: No sig reported) 90 tablet 3    REVIEW OF SYSTEMS:   [X]  denotes positive finding, [ ]  denotes negative finding Cardiac  Comments:  Chest pain or chest  pressure:    Shortness of breath upon exertion:    Short of breath when lying flat:    Irregular heart rhythm:        Vascular    Pain in calf, thigh, or hip brought on by ambulation:    Pain in feet at night that wakes you up from your sleep:     Blood  clot in your veins:    Leg swelling:         Pulmonary    Oxygen at home:    Productive cough:     Wheezing:         Neurologic    Sudden weakness in arms or legs:     Sudden numbness in arms or legs:     Sudden onset of difficulty speaking or slurred speech: x   Temporary loss of vision in one eye:     Problems with dizziness:  x       Gastrointestinal    Blood in stool:      Vomited blood:         Genitourinary    Burning when urinating:     Blood in urine:        Psychiatric    Major depression:         Hematologic    Bleeding problems:    Problems with blood clotting too easily:        Skin    Rashes or ulcers:        Constitutional    Fever or chills:     PHYSICAL EXAM:   Vitals:   04/24/21 1900 04/24/21 1915 04/24/21 1930 04/24/21 1945  BP: 136/60 (!) 147/65 140/60 (!) 164/62  Pulse: 65 70 70 70  Resp: 20 (!) 23 20 (!) 21  Temp:      TempSrc:      SpO2: 94% 97% 96% 96%  Weight:      Height:        GENERAL: The patient is a well-nourished male, in no acute distress. The vital signs are documented above. CARDIAC: There is a regular rate and rhythm.  PULMONARY: Nonlabored respirations ABDOMEN: Soft and non-tender with normal pitched bowel sounds.  MUSCULOSKELETAL: There are no major deformities or cyanosis. NEUROLOGIC: No focal weakness or paresthesias are detected. SKIN: There are no ulcers or rashes noted. PSYCHIATRIC: The patient has a normal affect.  STUDIES:   I have reviewed his MRI with the following findings: MRA NECK IMPRESSION:   1. Atheromatous change about the left carotid bulb/proximal left ICA with associated short-segment severe stenosis of up to 80% by NASCET criteria. 2.  Atheromatous change about the right carotid bulb/proximal right ICA with associated stenosis of up to 40% by NASCET criteria. 3. Wide patency of both vertebral arteries within the neck.    Brain MRI: 1. 8 mm focus of mild diffusion signal abnormality involving the subcortical/deep white matter of the anterior left frontal lobe, nonspecific, but favored to reflect changes of evolving late subacute small vessel ischemia. No associated hemorrhage or mass effect. 2. No other acute intracranial abnormality. 3. Mild for age chronic microvascular ischemic disease with small remote lacunar infarcts involving the right thalamus and right basal ganglia.   ASSESSMENT and PLAN   Symptomatic left carotid stenosis: I discussed with the patient and his wife that given the left carotid stenosis and the stroke seen on MRI, I would recommend left carotid intervention.  I discussed getting a CT angiogram of the neck to determine whether he would be a better candidate for TCAR or carotid endarterectomy.  I will follow-up tomorrow and discuss the surgical plan which will be scheduled for Wednesday.   Charlena Cross, MD, FACS [facility] of [facility] [phone] Pager [phone]

## 2021-04-24 NOTE — ED Notes (Signed)
Patient transported to MRI 

## 2021-04-24 NOTE — Evaluation (Signed)
Physical Therapy Evaluation Patient Details Name: Terry [name] MRN: [id number] DOB: Jun 20, 1946 Today's Date: 04/24/2021  History of Present Illness  Pt is a 75 y.o. male who presented 04/23/21 with 4 day hx of dizziness and L-sided headache. 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 Normal intracranial MRA. Atheromatous change about the left carotid bulb/proximal left ICA with associated short-segment severe stenosis of up to 80% by NASCET criteria. Atheromatous change about the right carotid bulb/proximal right ICA with associated stenosis of up to 40% by NASCET criteria. PMH: Parkinson's disease, carotid artery stenosis, hypertension   Clinical Impression  Pt presents with condition above and deficits mentioned below, see PT Problem List. PTA, he was independent, intermittently using a SPC for mobility based on his balance daily due to Parkinson's. He does report x3 falls in past 6 months due to his Parkinson's causing his foot to drag and trip him. Pt lives with his wife in a 1-level house with 2 STE with a handrail. Currently, pt reports some slightly slurred speech, memory/concentration deficits, and L inferior medial visual field blurriness. He displays WFL, intact, and symmetrical bil UE and lower extremity strength, coordination, and sensation when formally testing. However, he displays L lower extremity weakness and gross incoordination functionally. Pt with x1 LOB, min guard assist to recover, when adjusting his clothes in standing, reporting it was due to the lightheadedness/dizziness that is present in all positions continuously. Pt is able to perform all functional mobility at a min guard-supervision level. Recommending pt continue with Neuro Outpatient PT and OT upon discharge. Will continue to  follow acutely.      Recommendations for follow up therapy are one component of a multi-disciplinary discharge planning process, led by the attending physician.  Recommendations may be updated based on patient status, additional functional criteria and insurance authorization.  Follow Up Recommendations Outpatient PT;Supervision for mobility/OOB (continue with Neuro Outpatient PT)    Equipment Recommendations  None recommended by PT    Recommendations for Other Services       Precautions / Restrictions Precautions Precautions: Fall Precaution Comments: reports blurry vision L medial inferior field Restrictions Weight Bearing Restrictions: No      Mobility  Bed Mobility Overal bed mobility: Modified Independent             General bed mobility comments: Pt able to transition supine <> sit on stretcher without assistance with HOB elevated.    Transfers Overall transfer level: Needs assistance Equipment used: None Transfers: Sit to/from Stand Sit to Stand: Supervision         General transfer comment: Supervision for safety with slight instability noted, but no LOB.  Ambulation/Gait Ambulation/Gait assistance: Min guard Gait Distance (Feet): 185 Feet Assistive device: None Gait Pattern/deviations: Step-through pattern;Decreased step length - left;Decreased dorsiflexion - left;Decreased stride length Gait velocity: reduced Gait velocity interpretation: 1.31 - 2.62 ft/sec, indicative of limited community ambulator General Gait Details: Pt with L foot slap and decreased L step length, which pt reports is not his baseline. No LOB while ambulating, but slight unsteadiness noted, min guard assist for safety. Educated pt to have close supervision for safety with mobility and if get more lightheaded to use SPC or RW, whichever is safely preventing stumbling/LOB.  Stairs            Wheelchair Mobility    Modified Rankin (  Stroke Patients Only) Modified Rankin  (Stroke Patients Only) Pre-Morbid Rankin Score: No significant disability Modified Rankin: Moderately severe disability     Balance Overall balance assessment: Needs assistance Sitting-balance support: No upper extremity supported;Feet supported Sitting balance-Leahy Scale: Normal Sitting balance - Comments: Reaches down to feet to begin to doff pants without assistance.   Standing balance support: No upper extremity supported Standing balance-Leahy Scale: Good Standing balance comment: Able to ambulate without UE support or LOB but x1 stagger/LOB when adjusting gown standing with feet in static position, min guard for recovery. Pt reported he became more lightheaded as his environment (gown) shifted around him.                             Pertinent Vitals/Pain Pain Assessment: No/denies pain    Home Living Family/patient expects to be discharged to:: Private residence Living Arrangements: Spouse/significant other Available Help at Discharge: Family;Available 24 hours/day Type of Home: House Home Access: Stairs to enter Entrance Stairs-Rails: Right (ascending) Entrance Stairs-Number of Steps: 2 Home Layout: One level Home Equipment: Shower seat;Grab bars - tub/shower;Grab bars - toilet;Cane - single point;Walker - 2 wheels      Prior Function Level of Independence: Independent         Comments: Pt drives. Pt does not use AD majority of time but sometimes uses SPC when off due to Parkinson's etc. Reports x3 falls in past 6 months due to foot dragging from Parkinson's per pt.     Hand Dominance   Dominant Hand: Left    Extremity/Trunk Assessment   Upper Extremity Assessment Upper Extremity Assessment: Overall WFL for tasks assessed (MMT scores grossly symmetrical 4+ to 5 bil; intact sensation bil; coordination intact bil; noted resting tremor in L UE intermittently with pt reporting this is due to his Parkinson's)    Lower Extremity Assessment Lower  Extremity Assessment: LLE deficits/detail (MMT scores grossly symmetrical 4+ to 5 bil; intact sensation bil; coordination intact bil) LLE Deficits / Details: While MMT and coordination appeared intact and WFL bil and symmetrical when tested he did display some gross coordination and L leg weakness functionally, displaying moments where the L knee appeared to buckle slightly and a foot slap when walking LLE Coordination: decreased gross motor    Cervical / Trunk Assessment Cervical / Trunk Assessment: Normal  Communication   Communication: Expressive difficulties (reports slurred spoeech; wife confirmed crooked smile normal for pt)  Cognition Arousal/Alertness: Awake/alert Behavior During Therapy: WFL for tasks assessed/performed Overall Cognitive Status: Impaired/Different from baseline Area of Impairment: Memory                     Memory:  (see below)         General Comments: Reports slight difficulty with memory and concentrating currently compared to baseline.      General Comments General comments (skin integrity, edema, etc.): Reports blurred vision in L medial and inferior field    Exercises     Assessment/Plan    PT Assessment Patient needs continued PT services  PT Problem List Decreased strength;Decreased activity tolerance;Decreased balance;Decreased mobility;Decreased coordination;Decreased cognition       PT Treatment Interventions DME instruction;Gait training;Stair training;Functional mobility training;Therapeutic activities;Therapeutic exercise;Neuromuscular re-education;Balance training;Cognitive remediation;Patient/family education    PT Goals (Current goals can be found in the Care Plan section)  Acute Rehab PT Goals Patient Stated Goal: to get back to normal PT Goal Formulation: With patient/family Time For Goal Achievement:  05/08/21 Potential to Achieve Goals: Good    Frequency Min 4X/week   Barriers to discharge        Co-evaluation                AM-PAC PT "6 Clicks" Mobility  Outcome Measure Help needed turning from your back to your side while in a flat bed without using bedrails?: None Help needed moving from lying on your back to sitting on the side of a flat bed without using bedrails?: None Help needed moving to and from a bed to a chair (including a wheelchair)?: A Little Help needed standing up from a chair using your arms (e.g., wheelchair or bedside chair)?: A Little Help needed to walk in hospital room?: A Little Help needed climbing 3-5 steps with a railing? : A Little 6 Click Score: 20    End of Session   Activity Tolerance: Patient tolerated treatment well Patient left: in bed;with call bell/phone within reach;with family/visitor present Nurse Communication: Mobility status;Other (comment) (slurred speech, visual deficits, memory deficits) PT Visit Diagnosis: Unsteadiness on feet (R26.81);Other abnormalities of gait and mobility (R26.89);Muscle weakness (generalized) (M62.81);History of falling (Z91.81);Difficulty in walking, not elsewhere classified (R26.2);Other symptoms and signs involving the nervous system (R29.898);Dizziness and giddiness (R42)    Time: 3007-6226 PT Time Calculation (min) (ACUTE ONLY): 35 min   Charges:   PT Evaluation $PT Eval Moderate Complexity: 1 Mod PT Treatments $Therapeutic Activity: 8-22 mins        Raymond Gurney, PT, DPT Acute Rehabilitation Services  Pager: [phone] Office: [phone]   Jewel Baize 04/24/2021, 12:19 PM

## 2021-04-24 NOTE — Consult Note (Addendum)
Stroke Neurology Consultation Note  Consult Requested by: Dr. Arlean Hopping   Reason for Consult: Left frontal CVA  Consult Date:  04/24/21  The history was obtained from the patient and wife.  Patient is a good historian.   History of Present Illness:  Terry [name] is an 75 y.o. 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  Additionally, he is reporting severe elevated blood pressure to 200s for past 3 days and home log showing 180-190 systolic for two weeks prior to that. He reports symptoms present and waxing and waning for past week but worse on the day of presentation prompting EMS to be called. His BP management has been escalated over time in recent months. He reports compliance with his regimen. Of note, he has been taking Meloxicam daily for ongoing low back pain for 2 months. Today he reports headache had resolved overnight but is back again now located in the occipital region. He denies vision changes, dizziness, weakness, numbness or tingling or difficulty speaking (usual hypophonia present).  His wife is at the bedside. We discussed his ongoing work up and plan of care.   Date last known well: Unable to determine, ?a week ago Time last known well: Unable to determine, ? a week ago tPA Given: No:  MRS:   NIHSS:  0  Past Medical History:  Diagnosis Date   Carotid artery stenosis 04/29/2018   Chronic tension headaches 04/29/2018   Essential hypertension 04/29/2018   GERD (gastroesophageal reflux disease) 04/29/2018   HLD (hyperlipidemia) 04/29/2018   Parkinson's disease (HCC) 04/29/2018     Past Surgical History:  Procedure Laterality Date   LEFT HEART CATH AND CORONARY  ANGIOGRAPHY N/A 11/25/2019   Procedure: LEFT HEART CATH AND CORONARY ANGIOGRAPHY;  Surgeon: Lyn Records, MD;  Location: [facility];  Service: Cardiovascular;  Laterality: N/A;   TONSILLECTOMY     Family History  Problem Relation Age of Onset   Heart disease Mother    Cancer Father     Social History:  reports that he has never smoked. His smokeless tobacco use includes snuff. He reports current alcohol use. He reports that he does not use drugs.  Review of Systems: A full ROS was attempted today and was  able to be performed.  Systems assessed include - Constitutional, Eyes, HENT, Respiratory, Cardiovascular, Gastrointestinal, Genitourinary, Integument/breast, Hematologic/lymphatic, Musculoskeletal, Neurological, Behavioral/Psych, Endocrine, Allergic/Immunologic - with pertinent responses as per HPI.  Allergies:  Allergies  Allergen Reactions   Penicillins Rash   Erythromycin Rash   Calm Nausea And Vomiting     Medications: I have reviewed the patient's current medications. Prior to Admission: (Not in a hospital admission)   Test Results: CBC:  Recent Labs  Lab 04/23/21 2023 04/24/21 0500  WBC 8.5 7.7  NEUTROABS 5.3  --   HGB 13.4 12.7*  HCT 41.2 39.1  MCV 90.4 90.5  PLT 182 166   Basic Metabolic Panel:  Recent Labs  Lab 04/23/21 2023 04/24/21 0500  NA 139 137  K 3.9 3.4*  CL 105 104  CO2 25 26  GLUCOSE 101* 119*  BUN 18 17  CREATININE 0.96 0.96  CALCIUM 8.6* 8.5*  MG  --  2.0   Liver Function Tests: Recent Labs  Lab  04/20/21 2228 04/23/21 2023 04/24/21 0500  AST 15 14* 14*  ALT <5 <5 <5  ALKPHOS 55 52 51  BILITOT 1.0 0.7 0.5  PROT 4.5* 6.5 5.9*  ALBUMIN 4.0 4.1 3.8   No results for input(s): LIPASE, AMYLASE in the last 168 hours. No results for input(s): AMMONIA in the last 168 hours. Coagulation Studies: No results for input(s): LABPROT, INR in the last 72 hours. Cardiac Enzymes: No results for input(s): CKTOTAL, CKMB, CKMBINDEX,  TROPONINI in the last 168 hours. BNP: Invalid input(s): POCBNP CBG: No results for input(s): GLUCAP in the last 168 hours. Urinalysis:  Recent Labs  Lab 04/23/21 2120  COLORURINE YELLOW  LABSPEC 1.015  PHURINE 7.0  GLUCOSEU NEGATIVE  HGBUR SMALL*  BILIRUBINUR NEGATIVE  KETONESUR 5*  PROTEINUR NEGATIVE  NITRITE NEGATIVE  LEUKOCYTESUR NEGATIVE   Microbiology:  Results for orders placed or performed during the hospital encounter of 04/23/21  Resp Panel by RT-PCR (Flu A&B, Covid) Nasopharyngeal Swab     Status: None   Collection Time: 04/24/21 12:35 AM   Specimen: Nasopharyngeal Swab; Nasopharyngeal(NP) swabs in vial transport medium  Result Value Ref Range Status   SARS Coronavirus 2 by RT PCR NEGATIVE NEGATIVE Final    Comment: (NOTE) SARS-CoV-2 target nucleic acids are NOT DETECTED.  The SARS-CoV-2 RNA is generally detectable in upper respiratory specimens during the acute phase of infection. The lowest concentration of SARS-CoV-2 viral copies this assay can detect is 138 copies/mL. A negative result does not preclude SARS-Cov-2 infection and should not be used as the sole basis for treatment or other patient management decisions. A negative result may occur with  improper specimen collection/handling, submission of specimen other than nasopharyngeal swab, presence of viral mutation(s) within the areas targeted by this assay, and inadequate number of viral copies(<138 copies/mL). A negative result must be combined with clinical observations, patient history, and epidemiological information. The expected result is Negative.  Fact Sheet for Patients:  BloggerCourse.com  Fact Sheet for Healthcare Providers:  SeriousBroker.it  This test is no t yet approved or cleared by the [place] FDA and  has been authorized for detection and/or diagnosis of SARS-CoV-2 by FDA under an Emergency Use Authorization (EUA). This EUA will  remain  in effect (meaning this test can be used) for the duration of the COVID-19 declaration under Section 564(b)(1) of the Act, 21 U.S.C.section 360bbb-3(b)(1), unless the authorization is terminated  or revoked sooner.       Influenza A by PCR NEGATIVE NEGATIVE Final   Influenza B by PCR NEGATIVE NEGATIVE Final    Comment: (NOTE) The Xpert Xpress SARS-CoV-2/FLU/RSV plus assay is intended as an aid in the diagnosis of influenza from Nasopharyngeal swab specimens and should not be used as a sole basis for treatment. Nasal washings and aspirates are unacceptable for Xpert Xpress SARS-CoV-2/FLU/RSV testing.  Fact Sheet for Patients: BloggerCourse.com  Fact Sheet for Healthcare Providers: SeriousBroker.it  This test is not yet approved or cleared by the [place] FDA and has been authorized for detection and/or diagnosis of SARS-CoV-2 by FDA under an Emergency Use Authorization (EUA). This EUA will remain in effect (meaning this test can be used) for the duration of the COVID-19 declaration under Section 564(b)(1) of the Act, 21 U.S.C. section 360bbb-3(b)(1), unless the authorization is terminated or revoked.  Performed at [facility], [address], [place], [place] [address]    Lipid Panel:     Component Value Date/Time   CHOL 91 04/24/2021 0500  CHOL 99 (L) 12/10/2019 1112   TRIG 145 04/24/2021 0500   HDL 37 (L) 04/24/2021 0500   HDL 47 12/10/2019 1112   CHOLHDL 2.5 04/24/2021 0500   VLDL 29 04/24/2021 0500   LDLCALC 25 04/24/2021 0500   LDLCALC 39 12/10/2019 1112   HgbA1c:  Lab Results  Component Value Date   HGBA1C 5.4 04/24/2021   Urine Drug Screen: No results found for: LABOPIA, COCAINSCRNUR, LABBENZ, AMPHETMU, THCU, LABBARB  Alcohol Level: No results for input(s): ETH in the last 168 hours.  DG Chest 2 View  Result Date: 04/20/2021 CLINICAL DATA:  Chest pain, hypertension EXAM: CHEST - 2  VIEW COMPARISON:  None. FINDINGS: Lungs are clear.  No pleural effusion or pneumothorax. The heart is normal in size. Visualized osseous structures are within normal limits. IMPRESSION: Normal chest radiographs. Electronically Signed   By: Charline Bills M.D.   On: 04/20/2021 22:36   CT HEAD WO CONTRAST ( )  Result Date: 04/23/2021 CLINICAL DATA:  Neuro deficit EXAM: CT HEAD WITHOUT CONTRAST TECHNIQUE: Contiguous axial images were obtained from the base of the skull through the vertex without intravenous contrast. COMPARISON:  04/20/2021 FINDINGS: Brain: No evidence of acute infarction, hemorrhage, hydrocephalus, extra-axial collection or mass lesion/mass effect. Mild atrophic changes are noted. Vascular: No hyperdense vessel or unexpected calcification. Skull: Normal. Negative for fracture or focal lesion. Sinuses/Orbits: No acute finding. Other: None. IMPRESSION: No acute intracranial abnormality noted. Electronically Signed   By: Alcide Clever M.D.   On: 04/23/2021 21:05   CT Head Wo Contrast  Result Date: 04/20/2021 CLINICAL DATA:  Headache, hypertension EXAM: CT HEAD WITHOUT CONTRAST TECHNIQUE: Contiguous axial images were obtained from the base of the skull through the vertex without intravenous contrast. COMPARISON:  None. FINDINGS: Brain: Normal anatomic configuration. Parenchymal volume loss is commensurate with the patient's age. Mild periventricular white matter changes are present likely reflecting the sequela of small vessel ischemia. No abnormal intra or extra-axial mass lesion or fluid collection. No abnormal mass effect or midline shift. No evidence of acute intracranial hemorrhage or infarct. Ventricular size is normal. Cerebellum unremarkable. Vascular: No asymmetric hyperdense vasculature at the skull base. Skull: Intact Sinuses/Orbits: Middle turbinectomy has been performed. The visualized paranasal sinuses are clear. Orbits are unremarkable. Other: Mastoid air cells and middle ear  cavities are clear. Remote nasal fracture noted. IMPRESSION: No acute intracranial abnormality.  Mild senescent change. Electronically Signed   By: Helyn Numbers M.D.   On: 04/20/2021 22:59   MR ANGIO HEAD WO CONTRAST  Result Date: 04/24/2021 CLINICAL DATA:  Follow-up examination for acute stroke. EXAM: MRA NECK WITHOUT AND WITH CONTRAST MRA HEAD WITHOUT CONTRAST TECHNIQUE: Multiplanar and multiecho pulse sequences of the neck were obtained without and with intravenous contrast. Angiographic images of the neck were obtained using MRA technique without and with intravenous contrast; Angiographic images of the Circle of Willis were obtained using MRA technique without intravenous contrast. CONTRAST:  85mL GADAVIST GADOBUTROL 1 MMOL/ML IV SOLN COMPARISON:  Prior MRI from 04/23/2021. FINDINGS: MRA NECK FINDINGS AORTIC ARCH: Visualized aortic arch normal in caliber with normal 3 vessel morphology. No hemodynamically significant stenosis or other abnormality seen about the origin of the great vessels. RIGHT CAROTID SYSTEM: Right CCA patent from its origin to the bifurcation without stenosis. Atheromatous irregularity seen about the right carotid bulb/proximal right ICA. Associated stenosis of up to 40% by NASCET criteria. Right ICA otherwise patent distally to the skull base without stenosis, evidence for dissection or occlusion. LEFT CAROTID SYSTEM: Left  CCA patent from its origin to the bifurcation without stenosis. Prominent atheromatous change about the left carotid bulb/proximal left ICA. Associated severe short-segment stenosis of up to 80% by NASCET criteria (series 303, image 5). Left ICA otherwise patent distally to the skull base without stenosis, evidence for dissection, or occlusion. VERTEBRAL ARTERIES: Both vertebral arteries arise from the subclavian arteries. No proximal subclavian artery stenosis. Vertebral arteries patent within the neck without stenosis, evidence for dissection or occlusion. Other:  None. MRA HEAD FINDINGS ANTERIOR CIRCULATION: Both internal carotid arteries widely patent to the termini without stenosis. A1 segments widely patent. Normal anterior communicating artery complex. Both anterior cerebral arteries widely patent to their distal aspects without stenosis. No M1 stenosis or occlusion. Normal MCA bifurcations. Distal MCA branches well perfused and symmetric. POSTERIOR CIRCULATION: Both V4 segments patent to the vertebrobasilar junction without stenosis. Right vertebral artery slightly dominant. Both PICA origins patent and normal. Basilar widely patent to its distal aspect without stenosis. Tiny 2 mm outpouching at the origin of the right AICA most consistent with a small vascular infundibulum. Superior cerebellar arteries patent bilaterally. Both PCAs primarily supplied via the basilar and are well perfused to there distal aspects. Small right posterior communicating artery noted. Anatomic variant: None significant. Other: None.  No intracranial aneurysm. IMPRESSION: MRA HEAD IMPRESSION: Normal intracranial MRA. No large vessel occlusion, hemodynamically significant stenosis, or other acute vascular abnormality. MRA NECK IMPRESSION: 1. Atheromatous change about the left carotid bulb/proximal left ICA with associated short-segment severe stenosis of up to 80% by NASCET criteria. 2. Atheromatous change about the right carotid bulb/proximal right ICA with associated stenosis of up to 40% by NASCET criteria. 3. Wide patency of both vertebral arteries within the neck. Electronically Signed   By: Rise Mu M.D.   On: 04/24/2021 05:54   MR ANGIO NECK W WO CONTRAST  Result Date: 04/24/2021 CLINICAL DATA:  Follow-up examination for acute stroke. EXAM: MRA NECK WITHOUT AND WITH CONTRAST MRA HEAD WITHOUT CONTRAST TECHNIQUE: Multiplanar and multiecho pulse sequences of the neck were obtained without and with intravenous contrast. Angiographic images of the neck were obtained using MRA  technique without and with intravenous contrast; Angiographic images of the Circle of Willis were obtained using MRA technique without intravenous contrast. CONTRAST:  8mL GADAVIST GADOBUTROL 1 MMOL/ML IV SOLN COMPARISON:  Prior MRI from 04/23/2021. FINDINGS: MRA NECK FINDINGS AORTIC ARCH: Visualized aortic arch normal in caliber with normal 3 vessel morphology. No hemodynamically significant stenosis or other abnormality seen about the origin of the great vessels. RIGHT CAROTID SYSTEM: Right CCA patent from its origin to the bifurcation without stenosis. Atheromatous irregularity seen about the right carotid bulb/proximal right ICA. Associated stenosis of up to 40% by NASCET criteria. Right ICA otherwise patent distally to the skull base without stenosis, evidence for dissection or occlusion. LEFT CAROTID SYSTEM: Left CCA patent from its origin to the bifurcation without stenosis. Prominent atheromatous change about the left carotid bulb/proximal left ICA. Associated severe short-segment stenosis of up to 80% by NASCET criteria (series 303, image 5). Left ICA otherwise patent distally to the skull base without stenosis, evidence for dissection, or occlusion. VERTEBRAL ARTERIES: Both vertebral arteries arise from the subclavian arteries. No proximal subclavian artery stenosis. Vertebral arteries patent within the neck without stenosis, evidence for dissection or occlusion. Other: None. MRA HEAD FINDINGS ANTERIOR CIRCULATION: Both internal carotid arteries widely patent to the termini without stenosis. A1 segments widely patent. Normal anterior communicating artery complex. Both anterior cerebral arteries widely patent to their  distal aspects without stenosis. No M1 stenosis or occlusion. Normal MCA bifurcations. Distal MCA branches well perfused and symmetric. POSTERIOR CIRCULATION: Both V4 segments patent to the vertebrobasilar junction without stenosis. Right vertebral artery slightly dominant. Both PICA origins  patent and normal. Basilar widely patent to its distal aspect without stenosis. Tiny 2 mm outpouching at the origin of the right AICA most consistent with a small vascular infundibulum. Superior cerebellar arteries patent bilaterally. Both PCAs primarily supplied via the basilar and are well perfused to there distal aspects. Small right posterior communicating artery noted. Anatomic variant: None significant. Other: None.  No intracranial aneurysm. IMPRESSION: MRA HEAD IMPRESSION: Normal intracranial MRA. No large vessel occlusion, hemodynamically significant stenosis, or other acute vascular abnormality. MRA NECK IMPRESSION: 1. Atheromatous change about the left carotid bulb/proximal left ICA with associated short-segment severe stenosis of up to 80% by NASCET criteria. 2. Atheromatous change about the right carotid bulb/proximal right ICA with associated stenosis of up to 40% by NASCET criteria. 3. Wide patency of both vertebral arteries within the neck. Electronically Signed   By: Rise Mu M.D.   On: 04/24/2021 05:54   MR BRAIN WO CONTRAST  Result Date: 04/24/2021 CLINICAL DATA:  Initial evaluation for acute dizziness. EXAM: MRI HEAD WITHOUT CONTRAST TECHNIQUE: Multiplanar, multiecho pulse sequences of the brain and surrounding structures were obtained without intravenous contrast. COMPARISON:  Head CT from earlier the same day. FINDINGS: Brain: Cerebral volume within normal limits for age. Few scattered patchy subcentimeter T2/FLAIR hyperintensity noted about the periventricular and deep white matter both cerebral hemispheres, nonspecific, but most commonly related to chronic microvascular ischemic disease. Overall, changes are mild for age. Probable small remote lacunar infarct noted within the ventral right thalamus. Additional tiny remote lacunar infarct present at the right basal ganglia. 8 mm focus of mild diffusion signal seen involving the subcortical/deep white matter of the anterior left  frontal lobe (series 5, image 85). Associated prominent T2/FLAIR signal abnormality (series 11, image 20). No associated ADC correlate. Finding is nonspecific, but favored to reflect changes of evolving late subacute small vessel ischemia. No associated hemorrhage or mass effect. No other diffusion abnormality to suggest acute or subacute ischemia elsewhere within the brain. Gray-white matter differentiation otherwise maintained. No encephalomalacia to suggest chronic cortical infarction. No evidence for acute or chronic intracranial hemorrhage. No mass lesion, midline shift or mass effect. No hydrocephalus or extra-axial fluid collection. Pituitary gland suprasellar region within normal limits. Midline structures intact and normal. Vascular: Major intracranial vascular flow voids are maintained. Skull and upper cervical spine: Craniocervical junction within normal limits. Bone marrow signal intensity normal. No scalp soft tissue abnormality. Sinuses/Orbits: Patient status post bilateral ocular lens replacement. Globes and orbital soft tissues demonstrate no acute finding. Paranasal sinuses are clear. Small left mastoid effusion noted. Inner ear structures grossly normal. Negative nasopharynx. Other: None. IMPRESSION: 1. 8 mm focus of mild diffusion signal abnormality involving the subcortical/deep white matter of the anterior left frontal lobe, nonspecific, but favored to reflect changes of evolving late subacute small vessel ischemia. No associated hemorrhage or mass effect. 2. No other acute intracranial abnormality. 3. Mild for age chronic microvascular ischemic disease with small remote lacunar infarcts involving the right thalamus and right basal ganglia. Electronically Signed   By: Rise Mu M.D.   On: 04/24/2021 01:28     SR showing on tele monitor   Physical Examination: Temp:  [97.9 F (36.6 C)-98.2 F (36.8 C)] 97.9 F (36.6 C) (10/02 1125) Pulse Rate:  [68-84]  71 (10/02 1125) Resp:   [12-21] 20 (10/02 1125) BP: (133-241)/(61-166) 213/89 (10/02 1125) SpO2:  [94 %-100 %] 98 % (10/02 1125) Weight:  [83.5 kg] 83.5 kg (10/01 2006)  General - Well nourished, well developed, in no apparent distress.  Ophthalmologic - fundi not visualized due to noncooperation.  Cardiovascular - Regular rate and rhythm.  Mental Status -  Level of arousal and orientation to time, place, and person were intact. +Hyophonia, Language  including expression, naming, repetition, comprehension was assessed and found intact. Attention span and concentration were normal. Recent and remote memory were intact. Cranial Nerves II - XII - II - Visual field intact OU. III, IV, VI - Extraocular movements intact. V - Facial sensation intact bilaterally. VII - Facial movement intact bilaterally. VIII - Hearing & vestibular intact bilaterally. X - Palate elevates symmetrically. XI - Chin turning & shoulder shrug intact bilaterally. XII - Tongue protrusion intact.  Motor Strength - The patient's strength was normal in all extremities and pronator drift was absent. Bulk was normal and fasciculations were absent.   Motor Tone - Muscle tone was assessed at the neck and was mildly rigid. Sensory - Light touch was assessed and were symmetrical.   Coordination - +Rigidity bilat UE, RAMS mildly slower than on the left. Tremor was currently absent. Gait and Station - deferred.   Assessment:   Terry [name] is an 75 y.o. 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  Additionally, he is reporting severe elevated blood pressure to 200s for past 3 days and home log showing 180-190  systolic for two weeks prior to that. He reports symptoms present and waxing and waning for past week but worse on the day of presentation prompting EMS to be called. His BP management has been escalated over time in recent months. He reports compliance with his regimen. Currently he is reporting his occipital headache has returned.  Denies vision changes or dizziness at present. Of note, he has been taking Meloxicam daily for ongoing low back pain for 2 months. MRI is showing possible subacute stroke in the left frontal lobe for which neurology has been called. Vascular surgery has been consulted for significant left carotid artery stenosis (approx 80%) noted on MRA.   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 MRI   1. 8 mm focus of mild diffusion signal abnormality involving the subcortical/deep white matter of the anterior left frontal lobe, nonspecific, but favored to reflect changes of evolving late subacute small vessel ischemia.  2. No other acute intracranial abnormality. 3. Mild for age chronic microvascular ischemic disease with small remote lacunar infarcts involving the right thalamus and right basal ganglia. MRA   1. Atheromatous change about the left carotid bulb/proximal left ICA with associated short-segment severe stenosis of up to 80% by NASCET criteria. 2. Atheromatous change about the right carotid bulb/proximal right ICA with associated stenosis of up to 40% by NASCET criteria. 3. Wide patency of both vertebral arteries within the neck 2D Echo PENDING LDL 25 HgbA1c 5.4  VTE PPX recommended, management per primary team No anticoagulant or antiplatelet prior to admission Currently  on ASA  pending vascular surgery consult.   Therapy recommendations:  PENDING Disposition:  TBD  Left CAS Left ICA severe stenosis up to 80% noted on MRA CTA head and neck  left ICA 65%, right ICA 50% stenosis. Vascular surgery consult is PENDING  Hypertensive Emergency Unstable 238/98 currently, requiring PRN pushes  BP goal 140-180 in setting of CAS prior to intervention Chart review indicates BP has been difficult to control for more than two years with ER visits and cardiology/PCP notes indicating multiple agents tried.  Consider discontinuing NSAID use (home meloxicam)  Hyperlipidemia Home meds:  Lipitor  resumed in hospital LDL 25, at goal < 70 thus no high intensity dose is indicated  Glycemic management HgbA1c 5.4, goal < 7.0 No hx of DM2  Other Stroke Risk Factors Advanced age Smokeless tobacco, current  Current ETOH use, advised to drink no more than 1drink(s) a day Hx lacunar strokes, previously unknown, noted on imaging Family hx stroke (sister) Minimal Coronary artery disease UDS is pending  Other Active Problems  Hospital day # 0   Thank you for this consultation and allowing [place] to participate in the care of this patient.  Shon Hale, NP-C This plan of care was directed by Dr. Roda Shutters  ATTENDING NOTE: I reviewed above note and agree with the assessment and plan. Pt was seen and examined.   75 year old male with PMH of HTN, Left carotid artery stenosis, PD, CAD presented with headache, transient blurred vision, dizziness and unsteady gait.  CT no acute abnormality.  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  MRA head and neck showed left ICA bulb 80% stenosis.  CTA head and neck left ICA 65%, right ICA 50% stenosis.  EF 55 to 70%.  A1c 5.4, LDL 25.  On exam, patient neurologically intact, no focal deficit.  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  Vascular surgery consultation requested.  Continue aspirin 81 and Plavix 75 DAPT, as well as home Lipitor 20.  Given left ICA high-grade stenosis, recommend BP goal 140-180 before carotid intervention.  Continue Sinemet for PD management.   He follows with Dr. Westley Hummer at [facility].  PT/OT, will follow.  For detailed assessment and plan, please refer to above as I have made changes wherever appropriate.   Marvel Plan, MD PhD Stroke Neurology 04/24/2021 5:50 PM    To contact Stroke Continuity provider, please refer to [email or web address]. After hours, contact General Neurology

## 2021-04-24 NOTE — Progress Notes (Signed)
  Echocardiogram 2D Echocardiogram has been performed.  Leta Jungling M 04/24/2021, 10:39 AM

## 2021-04-24 NOTE — Evaluation (Signed)
Occupational Therapy Evaluation Patient Details Name: Terry [name] MRN: [id number] DOB: 03/01/1946 Today's Date: 04/24/2021   History of Present Illness Pt is a 74 y.o. male who presented 04/23/21 with 4 day hx of dizziness and L-sided headache. 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 PMH: Parkinson's disease, carotid artery stenosis, hypertension   Clinical Impression   Pt reports difficulty with vision and dizziness as ongoing problems with his Parkinson's, however States he has new R foot numbness. Pt reports "blurry vision" and vertical diplopia at times, especially at night. States his visual changes, which have become worse over the last year, make it difficult for him to read. Fortunately he does not drive at night when he experiences double vision. Note tremor L hand, which affects fine motor/coordination skills. Recommend follow up with OT at the neuro outpt center in addition to following up with his eye doctor.  All further OT needs to be met in the outpt venue. OT signing off.      Recommendations for follow up therapy are one component of a multi-disciplinary discharge planning process, led by the attending physician.  Recommendations may be updated based on patient status, additional functional criteria and insurance authorization.   Follow Up Recommendations  Outpatient OT (neuro outpt OT)  Refrain from driving until cleared by MD   Equipment Recommendations  None recommended by OT    Recommendations for Other Services       Precautions / Restrictions Precautions Precautions: Fall Restrictions Weight Bearing Restrictions: No      Mobility Bed Mobility Overal bed mobility: Modified Independent             General bed mobility comments: Pt able to transition supine  <> sit on stretcher without assistance with HOB elevated.    Transfers Overall transfer level: Needs assistance Equipment used: None Transfers: Sit to/from Stand Sit to Stand: Supervision         General transfer comment: Supervision for safety with slight instability noted, but no LOB.    Balance Overall balance assessment: Needs assistance Sitting-balance support: No upper extremity supported;Feet supported Sitting balance-Leahy Scale: Normal Sitting balance - Comments: Reaches down to feet to begin to doff pants without assistance.   Standing balance support: No upper extremity supported Standing balance-Leahy Scale: Good Standing balance comment: Able to ambulate without UE support or LOB but x1 stagger/LOB when adjusting gown standing with feet in static position, min guard for recovery. Pt reported he became more lightheaded as his environment (gown) shifted around him.                           ADL either performed or assessed with clinical judgement   ADL Overall ADL's : Modified independent                                       General ADL Comments: overall modified independent with ADL tasks; Educated pt on miportance of comleting most ADL in seated position to reduce risk of falls; pt has a shower seat - recommend he use the shower seat     Vision Patient Visual Report: Blurring of vision;Diplopia Additional Comments: Pt reports vision beign "blurry" adn complains of double vision - objects   on top of each other, mostly at night. His vision changes make reading more difficult     Perception     Praxis      Pertinent Vitals/Pain Pain Assessment: No/denies pain     Hand Dominance Left   Extremity/Trunk Assessment Upper Extremity Assessment Upper Extremity Assessment: Generalized weakness (mild coordination deficits; "they don't work like ai want them to")   Lower Extremity Assessment Lower Extremity Assessment: Defer to PT  evaluation   Cervical / Trunk Assessment Cervical / Trunk Assessment: Other exceptions (forward head)   Communication Communication Communication: Expressive difficulties (reports slurred spoeech; wife confirmed crooked smile normal for pt)   Cognition Arousal/Alertness: Awake/alert Behavior During Therapy: WFL for tasks assessed/performed Overall Cognitive Status: No family/caregiver present to determine baseline cognitive functioning Area of Impairment: Memory                     Memory:  (see below)         General Comments: Reports slight difficulty with memory and concentrating currently compared to baseline.   General Comments       Exercises     Shoulder Instructions      Home Living Family/patient expects to be discharged to:: Private residence Living Arrangements: Spouse/significant other Available Help at Discharge: Family;Available 24 hours/day Type of Home: House Home Access: Stairs to enter Entrance Stairs-Number of Steps: 2 Entrance Stairs-Rails: Right (ascending) Home Layout: One level     Bathroom Shower/Tub: Walk-in shower   Bathroom Toilet: Handicapped height Bathroom Accessibility: Yes   Home Equipment: Shower seat;Grab bars - tub/shower;Grab bars - toilet;Cane - single point;Walker - 2 wheels          Prior Functioning/Environment Level of Independence: Independent        Comments: Pt drives. Pt does not use AD majority of time but sometimes uses SPC when off due to Parkinson's etc. Reports x3 falls in past 6 months due to foot dragging from Parkinson's per pt.        OT Problem List: Impaired vision/perception;Impaired balance (sitting and/or standing);Impaired sensation      OT Treatment/Interventions:      OT Goals(Current goals can be found in the care plan section) Acute Rehab OT Goals Patient Stated Goal: to get back to normal OT Goal Formulation: All assessment and education complete, DC therapy  OT Frequency:      Barriers to D/C:            Co-evaluation              AM-PAC OT "6 Clicks" Daily Activity     Outcome Measure Help from another person eating meals?: None Help from another person taking care of personal grooming?: None Help from another person toileting, which includes using toliet, bedpan, or urinal?: None Help from another person bathing (including washing, rinsing, drying)?: None Help from another person to put on and taking off regular upper body clothing?: None Help from another person to put on and taking off regular lower body clothing?: None 6 Click Score: 24   End of Session Equipment Utilized During Treatment: Gait belt Nurse Communication: Mobility status  Activity Tolerance: Patient tolerated treatment well Patient left: in bed;with call bell/phone within reach;with family/visitor present  OT Visit Diagnosis: Other abnormalities of gait and mobility (R26.89);Low vision, both eyes (H54.2)                Time: 1655-1712 OT Time Calculation (min): 17 min Charges:  OT General Charges $OT   Visit: 1 Visit OT Evaluation $OT Eval Low Complexity: 1 Low  Hilary Ward, OT/L   Acute OT Clinical Specialist Acute Rehabilitation Services Pager [phone] Office [phone]   WARD,HILLARY 04/24/2021, 5:32 PM 

## 2021-04-25 DIAGNOSIS — I63232 Cerebral infarction due to unspecified occlusion or stenosis of left carotid arteries: Secondary | ICD-10-CM

## 2021-04-25 DIAGNOSIS — Z888 Allergy status to other drugs, medicaments and biological substances status: Secondary | ICD-10-CM | POA: Diagnosis not present

## 2021-04-25 DIAGNOSIS — Z880 Allergy status to penicillin: Secondary | ICD-10-CM | POA: Diagnosis not present

## 2021-04-25 DIAGNOSIS — Z720 Tobacco use: Secondary | ICD-10-CM | POA: Diagnosis not present

## 2021-04-25 DIAGNOSIS — I2510 Atherosclerotic heart disease of native coronary artery without angina pectoris: Secondary | ICD-10-CM | POA: Diagnosis present

## 2021-04-25 DIAGNOSIS — K219 Gastro-esophageal reflux disease without esophagitis: Secondary | ICD-10-CM | POA: Diagnosis present

## 2021-04-25 DIAGNOSIS — I6523 Occlusion and stenosis of bilateral carotid arteries: Secondary | ICD-10-CM | POA: Diagnosis present

## 2021-04-25 DIAGNOSIS — R4789 Other speech disturbances: Secondary | ICD-10-CM | POA: Diagnosis present

## 2021-04-25 DIAGNOSIS — I6522 Occlusion and stenosis of left carotid artery: Secondary | ICD-10-CM | POA: Diagnosis not present

## 2021-04-25 DIAGNOSIS — G44209 Tension-type headache, unspecified, not intractable: Secondary | ICD-10-CM | POA: Diagnosis present

## 2021-04-25 DIAGNOSIS — I10 Essential (primary) hypertension: Secondary | ICD-10-CM | POA: Diagnosis present

## 2021-04-25 DIAGNOSIS — H538 Other visual disturbances: Secondary | ICD-10-CM | POA: Diagnosis present

## 2021-04-25 DIAGNOSIS — R29701 NIHSS score 1: Secondary | ICD-10-CM | POA: Diagnosis present

## 2021-04-25 DIAGNOSIS — Z8249 Family history of ischemic heart disease and other diseases of the circulatory system: Secondary | ICD-10-CM | POA: Diagnosis not present

## 2021-04-25 DIAGNOSIS — Z20822 Contact with and (suspected) exposure to covid-19: Secondary | ICD-10-CM | POA: Diagnosis present

## 2021-04-25 DIAGNOSIS — R490 Dysphonia: Secondary | ICD-10-CM | POA: Diagnosis not present

## 2021-04-25 DIAGNOSIS — E876 Hypokalemia: Secondary | ICD-10-CM | POA: Diagnosis present

## 2021-04-25 DIAGNOSIS — I639 Cerebral infarction, unspecified: Secondary | ICD-10-CM | POA: Diagnosis present

## 2021-04-25 DIAGNOSIS — Z79899 Other long term (current) drug therapy: Secondary | ICD-10-CM | POA: Diagnosis not present

## 2021-04-25 DIAGNOSIS — R2681 Unsteadiness on feet: Secondary | ICD-10-CM | POA: Diagnosis present

## 2021-04-25 DIAGNOSIS — I161 Hypertensive emergency: Secondary | ICD-10-CM | POA: Diagnosis present

## 2021-04-25 DIAGNOSIS — E7800 Pure hypercholesterolemia, unspecified: Secondary | ICD-10-CM | POA: Diagnosis present

## 2021-04-25 DIAGNOSIS — I169 Hypertensive crisis, unspecified: Secondary | ICD-10-CM | POA: Diagnosis present

## 2021-04-25 DIAGNOSIS — I6502 Occlusion and stenosis of left vertebral artery: Secondary | ICD-10-CM | POA: Diagnosis not present

## 2021-04-25 DIAGNOSIS — G20 Parkinson's disease: Secondary | ICD-10-CM | POA: Diagnosis present

## 2021-04-25 LAB — TSH: TSH: 1.272 u[IU]/mL (ref 0.350–4.500)

## 2021-04-25 NOTE — Progress Notes (Addendum)
  Progress Note    04/25/2021 7:24 AM Hospital Day 2  Subjective:  no complaints this morning.  Wants to shower  afebrile  Vitals:   04/25/21 0120 04/25/21 0444  BP: (!) 128/58 (!) 141/66  Pulse:  65  Resp:  16  Temp:  97.7 F (36.5 C)  SpO2:  97%    Physical Exam: General:  sleeping-wakes easily.   Lungs:  non labored  CBC    Component Value Date/Time   WBC 7.7 04/24/2021 0500   RBC 4.32 04/24/2021 0500   HGB 12.7 (L) 04/24/2021 0500   HGB 15.9 11/18/2019 1216   HCT 39.1 04/24/2021 0500   HCT 47.8 11/18/2019 1216   PLT 166 04/24/2021 0500   PLT 233 11/18/2019 1216   MCV 90.5 04/24/2021 0500   MCV 88 11/18/2019 1216   MCH 29.4 04/24/2021 0500   MCHC 32.5 04/24/2021 0500   RDW 13.8 04/24/2021 0500   RDW 13.1 11/18/2019 1216   LYMPHSABS 2.4 04/23/2021 2023   MONOABS 0.7 04/23/2021 2023   EOSABS 0.1 04/23/2021 2023   BASOSABS 0.0 04/23/2021 2023    BMET    Component Value Date/Time   NA 137 04/24/2021 0500   NA 143 11/18/2019 1216   K 3.4 (L) 04/24/2021 0500   CL 104 04/24/2021 0500   CO2 26 04/24/2021 0500   GLUCOSE 119 (H) 04/24/2021 0500   BUN 17 04/24/2021 0500   BUN 23 11/18/2019 1216   CREATININE 0.96 04/24/2021 0500   CALCIUM 8.5 (L) 04/24/2021 0500   GFRNONAA >60 04/24/2021 0500   GFRAA 85 11/18/2019 1216    INR No results found for: INR   Intake/Output Summary (Last 24 hours) at 04/25/2021 0724 Last data filed at 04/24/2021 2044 Gross per 24 hour  Intake --  Output 425 ml  Net -425 ml    CTA neck 04/24/2021: 1. 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 2. 50% proximal right ICA stenosis. 3. Widely patent vertebral arteries. 4. Aortic Atherosclerosis    Assessment/Plan:  75 y.o. male with symptomatic carotid artery stenosis Hospital Day 2  -pt doing well this morning.   -Dr. Myra Gianotti to see pt today after reviewing CTA of the neck to determine TCAR vs CEA. -ok from vascular standpoint for pt to shower.   -if pt okay to discharge home  before surgery on Wednesday, we can set up surgery as outpatient.   -continue statin/plavix/asa   Doreatha Massed, PA-C [facility] [phone] 04/25/2021 7:24 AM  I agree with the above.  I have seen and evaluated the patient.  After reviewing his CT scan, I discussed that he would be a better candidate for carotid endarterectomy.  This is been scheduled for Wednesday.  I discussed the risks and benefits of the operation including the risk of nerve injury and stroke.  All questions were answered.  He will need to be n.p.o. after midnight Tuesday night.  Durene Cal

## 2021-04-25 NOTE — Plan of Care (Signed)
  Problem: Education: Goal: Knowledge of General Education information will improve Description: Including pain rating scale, medication(s)/side effects and non-pharmacologic comfort measures Outcome: Progressing   Problem: Health Behavior/Discharge Planning: Goal: Ability to manage health-related needs will improve Outcome: Progressing   Problem: Clinical Measurements: Goal: Will remain free from infection Outcome: Progressing Goal: Diagnostic test results will improve Outcome: Progressing Goal: Respiratory complications will improve Outcome: Progressing Goal: Cardiovascular complication will be avoided Outcome: Progressing   Problem: Activity: Goal: Risk for activity intolerance will decrease Outcome: Progressing   Problem: Nutrition: Goal: Adequate nutrition will be maintained Outcome: Progressing   

## 2021-04-25 NOTE — Progress Notes (Signed)
STROKE TEAM PROGRESS NOTE   INTERVAL HISTORY His wife is at the bedside.  Patient no acute event overnight, neuro stable.  Vascular surgery planned for Wednesday morning left CEA.  OBJECTIVE Vitals:   04/24/21 2109 04/24/21 2330 04/25/21 0120 04/25/21 0444  BP: (!) 167/70 (!) 140/56 (!) 128/58 (!) 141/66  Pulse: 67 64  65  Resp:  16  16  Temp: 98 F (36.7 C) 98.1 F (36.7 C)  97.7 F (36.5 C)  TempSrc:      SpO2: 99% 96%  97%  Weight: 80 kg     Height: 6\' 1"  (1.854 m)       CBC:  Recent Labs  Lab 04/23/21 2023 04/24/21 0500  WBC 8.5 7.7  NEUTROABS 5.3  --   HGB 13.4 12.7*  HCT 41.2 39.1  MCV 90.4 90.5  PLT 182 166    Basic Metabolic Panel:  Recent Labs  Lab 04/23/21 2023 04/24/21 0500  NA 139 137  K 3.9 3.4*  CL 105 104  CO2 25 26  GLUCOSE 101* 119*  BUN 18 17  CREATININE 0.96 0.96  CALCIUM 8.6* 8.5*  MG  --  2.0    Lipid Panel:     Component Value Date/Time   CHOL 91 04/24/2021 0500   CHOL 99 (L) 12/10/2019 1112   TRIG 145 04/24/2021 0500   HDL 37 (L) 04/24/2021 0500   HDL 47 12/10/2019 1112   CHOLHDL 2.5 04/24/2021 0500   VLDL 29 04/24/2021 0500   LDLCALC 25 04/24/2021 0500   LDLCALC 39 12/10/2019 1112   HgbA1c:  Lab Results  Component Value Date   HGBA1C 5.4 04/24/2021   Urine Drug Screen: No results found for: LABOPIA, COCAINSCRNUR, LABBENZ, AMPHETMU, THCU, LABBARB  Alcohol Level No results found for: ETH  IMAGING   CT HEAD WO CONTRAST (06/24/2021)  Result Date: 04/23/2021 CLINICAL DATA:  Neuro deficit EXAM: CT HEAD WITHOUT CONTRAST TECHNIQUE: Contiguous axial images were obtained from the base of the skull through the vertex without intravenous contrast. COMPARISON:  04/20/2021 FINDINGS: Brain: No evidence of acute infarction, hemorrhage, hydrocephalus, extra-axial collection or mass lesion/mass effect. Mild atrophic changes are noted. Vascular: No hyperdense vessel or unexpected calcification. Skull: Normal. Negative for fracture or focal  lesion. Sinuses/Orbits: No acute finding. Other: None. IMPRESSION: No acute intracranial abnormality noted. Electronically Signed   By: 04/22/2021 M.D.   On: 04/23/2021 21:05   CT ANGIO NECK W OR WO CONTRAST  Result Date: 04/24/2021 CLINICAL DATA:  Stroke/TIA, assess extracranial arteries. EXAM: CT ANGIOGRAPHY NECK TECHNIQUE: Multidetector CT imaging of the neck was performed using the standard protocol during bolus administration of intravenous contrast. Multiplanar CT image reconstructions and MIPs were obtained to evaluate the vascular anatomy. Carotid stenosis measurements (when applicable) are obtained utilizing NASCET criteria, using the distal internal carotid diameter as the denominator. CONTRAST:  89mL OMNIPAQUE IOHEXOL 350 MG/ML SOLN COMPARISON:  Neck MRA 04/24/2021 FINDINGS: Aortic arch: Standard 3 vessel aortic arch with mild atherosclerotic plaque. Widely patent brachiocephalic and subclavian arteries. Right carotid system: Patent with extensive calcified plaque at the carotid bifurcation and in the carotid bulb resulting in 50% proximal ICA stenosis. Left carotid system: Patent with extensive calcified plaque at the carotid bifurcation resulting in 65% proximal ICA stenosis. Vertebral arteries: Patent with calcified plaque at the right greater than left vertebral artery origins. No evidence of a significant stenosis or dissection. Codominant. Skeleton: No acute osseous abnormality or suspicious osseous lesion. Moderate lower cervical disc degeneration. Advanced left  facet arthrosis at C3-4. Other neck: No evidence of cervical lymphadenopathy or mass. Upper chest: Clear lung apices. IMPRESSION: 1. 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 2. 50% proximal right ICA stenosis. 3. Widely patent vertebral arteries. 4. Aortic Atherosclerosis (ICD10-I70.0). Electronically Signed   By: Sebastian Ache M.D.   On: 04/24/2021 16:44   MR ANGIO HEAD WO CONTRAST  Result Date: 04/24/2021 CLINICAL DATA:  Follow-up  examination for acute stroke. EXAM: MRA NECK WITHOUT AND WITH CONTRAST MRA HEAD WITHOUT CONTRAST TECHNIQUE: Multiplanar and multiecho pulse sequences of the neck were obtained without and with intravenous contrast. Angiographic images of the neck were obtained using MRA technique without and with intravenous contrast; Angiographic images of the Circle of Willis were obtained using MRA technique without intravenous contrast. CONTRAST:  8mL GADAVIST GADOBUTROL 1 MMOL/ML IV SOLN COMPARISON:  Prior MRI from 04/23/2021. FINDINGS: MRA NECK FINDINGS AORTIC ARCH: Visualized aortic arch normal in caliber with normal 3 vessel morphology. No hemodynamically significant stenosis or other abnormality seen about the origin of the great vessels. RIGHT CAROTID SYSTEM: Right CCA patent from its origin to the bifurcation without stenosis. Atheromatous irregularity seen about the right carotid bulb/proximal right ICA. Associated stenosis of up to 40% by NASCET criteria. Right ICA otherwise patent distally to the skull base without stenosis, evidence for dissection or occlusion. LEFT CAROTID SYSTEM: Left CCA patent from its origin to the bifurcation without stenosis. Prominent atheromatous change about the left carotid bulb/proximal left ICA. Associated severe short-segment stenosis of up to 80% by NASCET criteria (series 303, image 5). Left ICA otherwise patent distally to the skull base without stenosis, evidence for dissection, or occlusion. VERTEBRAL ARTERIES: Both vertebral arteries arise from the subclavian arteries. No proximal subclavian artery stenosis. Vertebral arteries patent within the neck without stenosis, evidence for dissection or occlusion. Other: None. MRA HEAD FINDINGS ANTERIOR CIRCULATION: Both internal carotid arteries widely patent to the termini without stenosis. A1 segments widely patent. Normal anterior communicating artery complex. Both anterior cerebral arteries widely patent to their distal aspects without  stenosis. No M1 stenosis or occlusion. Normal MCA bifurcations. Distal MCA branches well perfused and symmetric. POSTERIOR CIRCULATION: Both V4 segments patent to the vertebrobasilar junction without stenosis. Right vertebral artery slightly dominant. Both PICA origins patent and normal. Basilar widely patent to its distal aspect without stenosis. Tiny 2 mm outpouching at the origin of the right AICA most consistent with a small vascular infundibulum. Superior cerebellar arteries patent bilaterally. Both PCAs primarily supplied via the basilar and are well perfused to there distal aspects. Small right posterior communicating artery noted. Anatomic variant: None significant. Other: None.  No intracranial aneurysm. IMPRESSION: MRA HEAD IMPRESSION: Normal intracranial MRA. No large vessel occlusion, hemodynamically significant stenosis, or other acute vascular abnormality. MRA NECK IMPRESSION: 1. Atheromatous change about the left carotid bulb/proximal left ICA with associated short-segment severe stenosis of up to 80% by NASCET criteria. 2. Atheromatous change about the right carotid bulb/proximal right ICA with associated stenosis of up to 40% by NASCET criteria. 3. Wide patency of both vertebral arteries within the neck. Electronically Signed   By: Rise Mu M.D.   On: 04/24/2021 05:54   MR ANGIO NECK W WO CONTRAST  Result Date: 04/24/2021 CLINICAL DATA:  Follow-up examination for acute stroke. EXAM: MRA NECK WITHOUT AND WITH CONTRAST MRA HEAD WITHOUT CONTRAST TECHNIQUE: Multiplanar and multiecho pulse sequences of the neck were obtained without and with intravenous contrast. Angiographic images of the neck were obtained using MRA technique without and with intravenous  contrast; Angiographic images of the Circle of Willis were obtained using MRA technique without intravenous contrast. CONTRAST:  45mL GADAVIST GADOBUTROL 1 MMOL/ML IV SOLN COMPARISON:  Prior MRI from 04/23/2021. FINDINGS: MRA NECK  FINDINGS AORTIC ARCH: Visualized aortic arch normal in caliber with normal 3 vessel morphology. No hemodynamically significant stenosis or other abnormality seen about the origin of the great vessels. RIGHT CAROTID SYSTEM: Right CCA patent from its origin to the bifurcation without stenosis. Atheromatous irregularity seen about the right carotid bulb/proximal right ICA. Associated stenosis of up to 40% by NASCET criteria. Right ICA otherwise patent distally to the skull base without stenosis, evidence for dissection or occlusion. LEFT CAROTID SYSTEM: Left CCA patent from its origin to the bifurcation without stenosis. Prominent atheromatous change about the left carotid bulb/proximal left ICA. Associated severe short-segment stenosis of up to 80% by NASCET criteria (series 303, image 5). Left ICA otherwise patent distally to the skull base without stenosis, evidence for dissection, or occlusion. VERTEBRAL ARTERIES: Both vertebral arteries arise from the subclavian arteries. No proximal subclavian artery stenosis. Vertebral arteries patent within the neck without stenosis, evidence for dissection or occlusion. Other: None. MRA HEAD FINDINGS ANTERIOR CIRCULATION: Both internal carotid arteries widely patent to the termini without stenosis. A1 segments widely patent. Normal anterior communicating artery complex. Both anterior cerebral arteries widely patent to their distal aspects without stenosis. No M1 stenosis or occlusion. Normal MCA bifurcations. Distal MCA branches well perfused and symmetric. POSTERIOR CIRCULATION: Both V4 segments patent to the vertebrobasilar junction without stenosis. Right vertebral artery slightly dominant. Both PICA origins patent and normal. Basilar widely patent to its distal aspect without stenosis. Tiny 2 mm outpouching at the origin of the right AICA most consistent with a small vascular infundibulum. Superior cerebellar arteries patent bilaterally. Both PCAs primarily supplied via  the basilar and are well perfused to there distal aspects. Small right posterior communicating artery noted. Anatomic variant: None significant. Other: None.  No intracranial aneurysm. IMPRESSION: MRA HEAD IMPRESSION: Normal intracranial MRA. No large vessel occlusion, hemodynamically significant stenosis, or other acute vascular abnormality. MRA NECK IMPRESSION: 1. Atheromatous change about the left carotid bulb/proximal left ICA with associated short-segment severe stenosis of up to 80% by NASCET criteria. 2. Atheromatous change about the right carotid bulb/proximal right ICA with associated stenosis of up to 40% by NASCET criteria. 3. Wide patency of both vertebral arteries within the neck. Electronically Signed   By: Rise Mu M.D.   On: 04/24/2021 05:54   MR BRAIN WO CONTRAST  Result Date: 04/24/2021 CLINICAL DATA:  Initial evaluation for acute dizziness. EXAM: MRI HEAD WITHOUT CONTRAST TECHNIQUE: Multiplanar, multiecho pulse sequences of the brain and surrounding structures were obtained without intravenous contrast. COMPARISON:  Head CT from earlier the same day. FINDINGS: Brain: Cerebral volume within normal limits for age. Few scattered patchy subcentimeter T2/FLAIR hyperintensity noted about the periventricular and deep white matter both cerebral hemispheres, nonspecific, but most commonly related to chronic microvascular ischemic disease. Overall, changes are mild for age. Probable small remote lacunar infarct noted within the ventral right thalamus. Additional tiny remote lacunar infarct present at the right basal ganglia. 8 mm focus of mild diffusion signal seen involving the subcortical/deep white matter of the anterior left frontal lobe (series 5, image 85). Associated prominent T2/FLAIR signal abnormality (series 11, image 20). No associated ADC correlate. Finding is nonspecific, but favored to reflect changes of evolving late subacute small vessel ischemia. No associated hemorrhage or  mass effect. No other diffusion abnormality to  suggest acute or subacute ischemia elsewhere within the brain. Gray-white matter differentiation otherwise maintained. No encephalomalacia to suggest chronic cortical infarction. No evidence for acute or chronic intracranial hemorrhage. No mass lesion, midline shift or mass effect. No hydrocephalus or extra-axial fluid collection. Pituitary gland suprasellar region within normal limits. Midline structures intact and normal. Vascular: Major intracranial vascular flow voids are maintained. Skull and upper cervical spine: Craniocervical junction within normal limits. Bone marrow signal intensity normal. No scalp soft tissue abnormality. Sinuses/Orbits: Patient status post bilateral ocular lens replacement. Globes and orbital soft tissues demonstrate no acute finding. Paranasal sinuses are clear. Small left mastoid effusion noted. Inner ear structures grossly normal. Negative nasopharynx. Other: None. IMPRESSION: 1. 8 mm focus of mild diffusion signal abnormality involving the subcortical/deep white matter of the anterior left frontal lobe, nonspecific, but favored to reflect changes of evolving late subacute small vessel ischemia. No associated hemorrhage or mass effect. 2. No other acute intracranial abnormality. 3. Mild for age chronic microvascular ischemic disease with small remote lacunar infarcts involving the right thalamus and right basal ganglia. Electronically Signed   By: Rise Mu M.D.   On: 04/24/2021 01:28   ECHOCARDIOGRAM COMPLETE  Result Date: 04/24/2021    ECHOCARDIOGRAM REPORT   Patient Name:   Terry [name] Date of Exam: 04/24/2021 Medical Rec #:  [id number]     Height:       73.0 in Accession #:    [id number]    Weight:       184.0 lb Date of Birth:  10-01-1945     BSA:          2.077 m Patient Age:    74 years      BP:           164/61 mmHg Patient Gender: M             HR:           69 bpm. Exam Location:  Inpatient Procedure: 2D Echo,  Cardiac Doppler and Color Doppler Indications:    Stroke I63.9  History:        Patient has prior history of Echocardiogram examinations, most                 recent 10/31/2019. Carotid Disease; Risk Factors:Dyslipidemia and                 Hypertension. Parkinsons.  Sonographer:    Leta Jungling RDCS Referring Phys: [id number] Angie Fava IMPRESSIONS  1. Left ventricular ejection fraction, by estimation, is 65 to 70%. The left ventricle has normal function. The left ventricle has no regional wall motion abnormalities. There is moderate left ventricular hypertrophy. Left ventricular diastolic parameters are indeterminate.  2. Right ventricular systolic function is normal. The right ventricular size is normal.  3. Left atrial size was mildly dilated.  4. Right atrial size was mildly dilated.  5. The mitral valve is grossly normal. No evidence of mitral valve regurgitation.  6. The aortic valve is tricuspid. Aortic valve regurgitation is not visualized. Comparison(s): Prior images reviewed side by side. No significant change in LVEF. FINDINGS  Left Ventricle: Left ventricular ejection fraction, by estimation, is 65 to 70%. The left ventricle has normal function. The left ventricle has no regional wall motion abnormalities. The left ventricular internal cavity size was normal in size. There is  moderate left ventricular hypertrophy. Left ventricular diastolic parameters are indeterminate. Right Ventricle: The right ventricular size is normal. No increase in right ventricular wall  thickness. Right ventricular systolic function is normal. Left Atrium: Left atrial size was mildly dilated. Right Atrium: Right atrial size was mildly dilated. Pericardium: There is no evidence of pericardial effusion. Mitral Valve: The mitral valve is grossly normal. No evidence of mitral valve regurgitation. Tricuspid Valve: The tricuspid valve is grossly normal. Tricuspid valve regurgitation is mild. Aortic Valve: The aortic valve is  tricuspid. There is mild to moderate aortic valve annular calcification. Aortic valve regurgitation is not visualized. Pulmonic Valve: The pulmonic valve was grossly normal. Pulmonic valve regurgitation is trivial. Aorta: The aortic root is normal in size and structure. IAS/Shunts: The interatrial septum appears to be lipomatous. No atrial level shunt detected by color flow Doppler.  LEFT VENTRICLE PLAX 2D LVIDd:         4.60 cm  Diastology LV PW:         1.10 cm  LV e' medial:    6.00 cm/s LV IVS:        1.20 cm  LV E/e' medial:  11.4 LVOT diam:     2.00 cm  LV e' lateral:   6.08 cm/s LV SV:         62       LV E/e' lateral: 11.2 LV SV Index:   30 LVOT Area:     3.14 cm  RIGHT VENTRICLE RV S prime:     15.30 cm/s TAPSE (M-mode): 3.7 cm LEFT ATRIUM             Index       RIGHT ATRIUM           Index LA diam:        2.80 cm 1.35 cm/m  RA Area:     22.80 cm LA Vol (A2C):   58.5 ml 28.17 ml/m RA Volume:   71.10 ml  34.24 ml/m LA Vol (A4C):   75.2 ml 36.21 ml/m LA Biplane Vol: 67.9 ml 32.70 ml/m  AORTIC VALVE LVOT Vmax:   105.00 cm/s LVOT Vmean:  68.500 cm/s LVOT VTI:    0.197 m  AORTA Ao Root diam: 3.00 cm Ao Asc diam:  3.00 cm MITRAL VALVE MV Area (PHT): 3.16 cm    SHUNTS MV Decel Time: 240 msec    Systemic VTI:  0.20 m MV E velocity: 68.40 cm/s  Systemic Diam: 2.00 cm MV A velocity: 82.30 cm/s MV E/A ratio:  0.83 Nona Dell MD Electronically signed by Nona Dell MD Signature Date/Time: 04/24/2021/3:25:55 PM    Final      PHYSICAL EXAM Blood pressure (!) 141/66, pulse 65, temperature 97.7 F (36.5 C), resp. rate 16, height 6\' 1"  (1.854 m), weight 80 kg, SpO2 97 %.  General - Well nourished, well developed, in no apparent distress.   Ophthalmologic - fundi not visualized due to noncooperation.   Cardiovascular - Regular rate and rhythm.   Mental Status -  Level of arousal and orientation to time, place, and person were intact. +Hyophonia, Language  including expression, naming,  repetition, comprehension was assessed and found intact. Attention span and concentration were normal. Recent and remote memory were intact. Cranial Nerves II - XII - II - Visual field intact OU. III, IV, VI - Extraocular movements intact. V - Facial sensation intact bilaterally. VII - Facial movement intact bilaterally. VIII - Hearing & vestibular intact bilaterally. X - Palate elevates symmetrically. XI - Chin turning & shoulder shrug intact bilaterally. XII - Tongue protrusion intact.   Motor Strength - The patient's strength was  normal in all extremities and pronator drift was absent. Bulk was normal and fasciculations were absent.   Motor Tone - Muscle tone was assessed at the neck and was mildly rigid. Sensory - Light touch was assessed and were symmetrical.   Coordination - +Rigidity bilat UE, RAMS mildly slower than on the left. Tremor was currently absent. Gait and Station - deferred.    Assessment:   Oree Hislop is an 75 y.o. 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  Additionally, he is reporting severe elevated blood pressure to 200s for past 3 days and home log showing 180-190 systolic for two weeks prior to that. He reports symptoms present and waxing and waning for past week but worse on the day of presentation prompting EMS to be called. His BP management has been escalated over time in recent months. He reports compliance with his regimen. Currently he is reporting his occipital headache has returned.  Denies vision changes or dizziness at present. Of note, he has been taking Meloxicam daily for ongoing low back pain for 2 months. MRI is  showing possible subacute stroke in the left frontal lobe for which neurology has been called. Vascular surgery has been consulted for significant left carotid artery stenosis (approx 80%) noted on MRA.    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   MRA head and neck showed left ICA bulb 80% stenosis.   CTA head and neck left ICA 65%, right ICA 50% stenosis.   EF 55 to 70% LDL 25 HgbA1c 5.4  UDS pending VTE SCDs No anticoagulant or antiplatelet prior to admission, currently on ASA 81mg  and Plavix 75 DAPT, further antiplatelet regimen per vascular surgery   Therapy recommendations:  pending  Disposition:  TBD   Left carotid stenosis Left ICA severe stenosis up to 80% noted on MRA CTA head and neck left ICA 65%, right ICA 50% stenosis. Vascular surgery Dr. plan for left CEA on Wednesday.   Hypertensive Emergency Unstable 238/98 currently, requiring PRN pushes  On amlodipine 10 BP goal 140-180 in setting of CAS prior to intervention LTM BP goal normotensive   Hyperlipidemia Home meds:  Lipitor 20mg   resumed in hospital LDL 25, at goal < 70 Continue Lipitor 20 on discharge  Other Stroke Risk Factors Advanced age Smokeless tobacco, current, cessation education provided Current ETOH use, advised to drink no more than 1drink(s) a day Hx lacunar strokes, previously unknown, noted on imaging Family hx stroke (sister) Minimal Coronary artery disease  Hospital day # 0  Neurology will sign off. Please call with questions. Pt will follow up with his neurologist Dr. Saturday at [facility] on 05/23/2021. Thanks for the consult.  [facility], MD PhD Stroke Neurology 04/25/2021 2:30 PM   To contact Stroke Continuity provider, please refer to [facility] Plan. After hours, contact General Neurology

## 2021-04-25 NOTE — TOC Initial Note (Signed)
Transition of Care [facility]) - Initial/Assessment Note    Patient Details  Name: Terry [name] MRN: [id number] Date of Birth: 23-Oct-1945  Transition of Care [facility]) CM/SW Contact:    Joanne Chars, LCSW Phone Number: 04/25/2021, 10:03 AM  Clinical Narrative:   CSW met with pt regarding [place], also discussed DC plan.  Confirmed plan for Outpt PT.  Current DME in home: walker, cane, shower chair.  Permission given to speak with wife [name].                  Expected Discharge Plan: Home/Self Care Barriers to Discharge: Continued Medical Work up   Patient Goals and CMS Choice   CMS Medicare.gov Compare Post Acute Care list provided to::  (na)    Expected Discharge Plan and Services Expected Discharge Plan: Home/Self Care     Post Acute Care Choice: Resumption of Svcs/PTA Provider (outpt rehab-Neuro rehab) Living arrangements for the past 2 months: Single Family Home                                      Prior Living Arrangements/Services Living arrangements for the past 2 months: Single Family Home Lives with:: Spouse Patient language and need for interpreter reviewed:: Yes Do you feel safe going back to the place where you live?: Yes      Need for Family Participation in Patient Care: Yes (Comment) Care giver support system in place?: Yes (comment) Current home services: DME (cane walker grab bars in shower) Criminal Activity/Legal Involvement Pertinent to Current Situation/Hospitalization: No - Comment as needed  Activities of Daily Living Home Assistive Devices/Equipment: None ADL Screening (condition at time of admission) Patient's cognitive ability adequate to safely complete daily activities?: Yes Is the patient deaf or have difficulty hearing?: No Does the patient have difficulty seeing, even when wearing glasses/contacts?: No Does the patient have difficulty concentrating, remembering, or making decisions?: No Patient able to express need for assistance with  ADLs?: Yes Does the patient have difficulty dressing or bathing?: No Independently performs ADLs?: Yes (appropriate for developmental age) Does the patient have difficulty walking or climbing stairs?: No Weakness of Legs: None Weakness of Arms/Hands: Left (tremor)  Permission Sought/Granted Permission sought to share information with : Family Supports Permission granted to share information with : Yes, Verbal Permission Granted  Share Information with NAME: wife [name]           Emotional Assessment Appearance:: Appears stated age Attitude/Demeanor/Rapport: Engaged Affect (typically observed): Appropriate Orientation: : Oriented to Self, Oriented to Place, Oriented to  Time, Oriented to Situation Alcohol / Substance Use: Not Applicable Psych Involvement: No (comment)  Admission diagnosis:  Hypertensive crisis [I16.9] Hypertensive emergency [I16.1] Patient Active Problem List   Diagnosis Date Noted   Stroke ([place]) 04/24/2021   Headache 04/24/2021   Hypertensive crisis 04/23/2021   Chest tightness 10/09/2019   Shortness of breath 10/09/2019   Erectile dysfunction 03/12/2019   Essential hypertension 04/29/2018   Need for immunization against influenza 04/29/2018   Parkinson's disease ([place]) 04/29/2018   Carotid artery stenosis 04/29/2018   HLD (hyperlipidemia) 04/29/2018   GERD (gastroesophageal reflux disease) 04/29/2018   Colon cancer screening 04/29/2018   Chronic tension headaches 04/29/2018   PCP:  Merrilee Seashore, MD Pharmacy:   [id number]: [phone]: [phone]    Social Determinants of Health (  SDOH) Interventions    Readmission Risk Interventions No flowsheet data found.

## 2021-04-25 NOTE — Progress Notes (Addendum)
PROGRESS NOTE    Terry [name]  JWJ:[id number] DOB: 1945-09-27 DOA: 04/23/2021 PCP: Georgianne Fick, MD   Brief Narrative:  Terry [name] is a 75 y.o. male with medical history significant for Parkinson's disease, carotid artery stenosis, hypertension, who is admitted to [facility] on 04/23/2021 with subacute ischemic CVA after presenting from home to [facility] ED complaining of dizziness and evidence of significantly elevated blood pressure since last 4 days.  Upon arrival to ED, his other vitals were stable except his blood pressure which was 241/94, COVID-19/influenza PCR were negative. Noncontrast CT of the head showed no evidence of acute intracranial process, including no evidence of intracranial hemorrhage.  Neurology was consulted, MRI confirmed subacute left frontal stroke.  Assessment & Plan:   Principal Problem:   Stroke [facility]) Active Problems:   Essential hypertension   Parkinson's disease (HCC)   GERD (gastroesophageal reflux disease)   Hypertensive crisis   Headache  Acute ischemic stroke: MRI confirms 8 mm subcortical/deep white matter anterior left frontal lobe infarct.  MR angiogram of neck shows high-grade left ICA stenosis of 80%.  Patient was not on any antiplatelet prior to admission.  He has been started on aspirin 81 mg p.o. daily.  Vascular surgery consulted.  CT angiogram of neck completed.  Carotid endarterectomy recommended and scheduled for Wednesday morning.  I personally discussed with Dr. Myra Gianotti and he recommends keeping patient in the hospital. Goal to keep systolic between 621-308.  Patient on amlodipine 10 mg.  Blood pressure at goal now.  Echo shows normal ejection fraction with no wall motion abnormality.  No PFO.  Hypokalemia: Replaced yesterday.  We will recheck labs in the morning.  Hyperlipidemia: Currently on atorvastatin 20 mg, will continue that.  LDL very low.  HDL low as well.  Parkinson's disease: Continue home medications.  GERD:  Continue PPI  DVT prophylaxis: SCDs Start: 04/23/21 2354   Code Status: Full Code  Family Communication: None at bedside.  Plan of care discussed with patient.  He voiced understanding.  Status is: Observation  The patient will require care spanning > 2 midnights and should be moved to inpatient because: Ongoing diagnostic testing needed not appropriate for outpatient work up  Dispo: The patient is from: Home              Anticipated d/c is to: Home              Patient currently is not medically stable to d/c.   Difficult to place patient No        Estimated body mass index is 23.27 kg/m as calculated from the following:   Height as of this encounter:  (1.854 m).   Weight as of this encounter: 80 kg.     Nutritional Assessment: Body mass index is 23.27 kg/m.Marland Kitchen Seen by dietician.  I agree with the assessment and plan as outlined below: Nutrition Status:        .  Skin Assessment: I have examined the patient's skin and I agree with the wound assessment as performed by the wound care RN as outlined below:    Consultants:  Neurology Vascular surgery  Procedures:  None  Antimicrobials:  Anti-infectives (From admission, onward)    None          Subjective: Seen and examined.  He has no complaints.  He is fully aware of the plan.  Objective: Vitals:   04/25/21 0120 04/25/21 0444 04/25/21 0750 04/25/21 0810  BP: (!) 128/58 (!) 141/66  116/79  Pulse:  65  62  Resp:  16  18  Temp:  97.7 F (36.5 C)  98 F (36.7 C)  TempSrc:      SpO2:  97% 96% 94%  Weight:      Height:        Intake/Output Summary (Last 24 hours) at 04/25/2021 1116 Last data filed at 04/24/2021 2044 Gross per 24 hour  Intake --  Output 425 ml  Net -425 ml    Filed Weights   04/23/21 2006 04/24/21 2109  Weight: 83.5 kg 80 kg    Examination:  General exam: Appears calm and comfortable  Respiratory system: Clear to auscultation. Respiratory effort  normal. Cardiovascular system: S1 & S2 heard, RRR. No JVD, murmurs, rubs, gallops or clicks. No pedal edema. Gastrointestinal system: Abdomen is nondistended, soft and nontender. No organomegaly or masses felt. Normal bowel sounds heard. Central nervous system: Alert and oriented. No focal neurological deficits. Extremities: Symmetric 5 x 5 power. Skin: No rashes, lesions or ulcers.  Psychiatry: Judgement and insight appear normal. Mood & affect appropriate.    Data Reviewed: I have personally reviewed following labs and imaging studies  CBC: Recent Labs  Lab 04/20/21 2228 04/23/21 2023 04/24/21 0500  WBC 8.1 8.5 7.7  NEUTROABS  --  5.3  --   HGB 13.4 13.4 12.7*  HCT 40.5 41.2 39.1  MCV 89.6 90.4 90.5  PLT 180 182 166    Basic Metabolic Panel: Recent Labs  Lab 04/20/21 2228 04/23/21 2023 04/24/21 0500  NA 138 139 137  K 4.5 3.9 3.4*  CL 102 105 104  CO2 GLUCOSE 110* 101* 119*  BUN CREATININE 0.95 0.96 0.96  CALCIUM 9.1 8.6* 8.5*  MG  --   --  2.0    GFR: Estimated Creatinine Clearance: 76.3 mL/min (by C-G formula based on SCr of 0.96 mg/dL). Liver Function Tests: Recent Labs  Lab 04/20/21 2228 04/23/21 2023 04/24/21 0500  AST 15 14* 14*  ALT <5 <5 <5  ALKPHOS 55 52 51  BILITOT 1.0 0.7 0.5  PROT 4.5* 6.5 5.9*  ALBUMIN 4.0 4.1 3.8    No results for input(s): LIPASE, AMYLASE in the last 168 hours. No results for input(s): AMMONIA in the last 168 hours. Coagulation Profile: No results for input(s): INR, PROTIME in the last 168 hours. Cardiac Enzymes: No results for input(s): CKTOTAL, CKMB, CKMBINDEX, TROPONINI in the last 168 hours. BNP (last 3 results) No results for input(s): PROBNP in the last 8760 hours. HbA1C: Recent Labs    04/24/21 0500  HGBA1C 5.4    CBG: No results for input(s): GLUCAP in the last 168 hours. Lipid Profile: Recent Labs    04/24/21 0500  CHOL 91  HDL 37*  LDLCALC 25  TRIG 960  CHOLHDL 2.5     Thyroid Function Tests: Recent Labs    04/25/21 0559  TSH 1.272   Anemia Panel: No results for input(s): VITAMINB12, FOLATE, FERRITIN, TIBC, IRON, RETICCTPCT in the last 72 hours. Sepsis Labs: No results for input(s): PROCALCITON, LATICACIDVEN in the last 168 hours.  Recent Results (from the past 240 hour(s))  Resp Panel by RT-PCR (Flu A&B, Covid) Nasopharyngeal Swab     Status: None   Collection Time: 04/24/21 12:35 AM   Specimen: Nasopharyngeal Swab; Nasopharyngeal(NP) swabs in vial transport medium  Result Value Ref Range Status   SARS Coronavirus 2 by RT PCR NEGATIVE NEGATIVE Final    Comment: (NOTE)  SARS-CoV-2 target nucleic acids are NOT DETECTED.  The SARS-CoV-2 RNA is generally detectable in upper respiratory specimens during the acute phase of infection. The lowest concentration of SARS-CoV-2 viral copies this assay can detect is 138 copies/mL. A negative result does not preclude SARS-Cov-2 infection and should not be used as the sole basis for treatment or other patient management decisions. A negative result may occur with  improper specimen collection/handling, submission of specimen other than nasopharyngeal swab, presence of viral mutation(s) within the areas targeted by this assay, and inadequate number of viral copies(<138 copies/mL). A negative result must be combined with clinical observations, patient history, and epidemiological information. The expected result is Negative.  Fact Sheet for Patients:  BloggerCourse.com  Fact Sheet for Healthcare Providers:  SeriousBroker.it  This test is no t yet approved or cleared by the [place] FDA and  has been authorized for detection and/or diagnosis of SARS-CoV-2 by FDA under an Emergency Use Authorization (EUA). This EUA will remain  in effect (meaning this test can be used) for the duration of the COVID-19 declaration under Section 564(b)(1) of the Act,  21 U.S.C.section 360bbb-3(b)(1), unless the authorization is terminated  or revoked sooner.       Influenza A by PCR NEGATIVE NEGATIVE Final   Influenza B by PCR NEGATIVE NEGATIVE Final    Comment: (NOTE) The Xpert Xpress SARS-CoV-2/FLU/RSV plus assay is intended as an aid in the diagnosis of influenza from Nasopharyngeal swab specimens and should not be used as a sole basis for treatment. Nasal washings and aspirates are unacceptable for Xpert Xpress SARS-CoV-2/FLU/RSV testing.  Fact Sheet for Patients: BloggerCourse.com  Fact Sheet for Healthcare Providers: SeriousBroker.it  This test is not yet approved or cleared by the [place] FDA and has been authorized for detection and/or diagnosis of SARS-CoV-2 by FDA under an Emergency Use Authorization (EUA). This EUA will remain in effect (meaning this test can be used) for the duration of the COVID-19 declaration under Section 564(b)(1) of the Act, 21 U.S.C. section 360bbb-3(b)(1), unless the authorization is terminated or revoked.  Performed at [facility], [address] [place], [place] [address]        Radiology Studies: CT HEAD WO CONTRAST ( )  Result Date: 04/23/2021 CLINICAL DATA:  Neuro deficit EXAM: CT HEAD WITHOUT CONTRAST TECHNIQUE: Contiguous axial images were obtained from the base of the skull through the vertex without intravenous contrast. COMPARISON:  04/20/2021 FINDINGS: Brain: No evidence of acute infarction, hemorrhage, hydrocephalus, extra-axial collection or mass lesion/mass effect. Mild atrophic changes are noted. Vascular: No hyperdense vessel or unexpected calcification. Skull: Normal. Negative for fracture or focal lesion. Sinuses/Orbits: No acute finding. Other: None. IMPRESSION: No acute intracranial abnormality noted. Electronically Signed   By: Alcide Clever M.D.   On: 04/23/2021 21:05   CT ANGIO NECK W OR WO CONTRAST  Result Date:  04/24/2021 CLINICAL DATA:  Stroke/TIA, assess extracranial arteries. EXAM: CT ANGIOGRAPHY NECK TECHNIQUE: Multidetector CT imaging of the neck was performed using the standard protocol during bolus administration of intravenous contrast. Multiplanar CT image reconstructions and MIPs were obtained to evaluate the vascular anatomy. Carotid stenosis measurements (when applicable) are obtained utilizing NASCET criteria, using the distal internal carotid diameter as the denominator. CONTRAST:  75mL OMNIPAQUE IOHEXOL 350 MG/ML SOLN COMPARISON:  Neck MRA 04/24/2021 FINDINGS: Aortic arch: Standard 3 vessel aortic arch with mild atherosclerotic plaque. Widely patent brachiocephalic and subclavian arteries. Right carotid system: Patent with extensive calcified plaque at the carotid bifurcation and in the  carotid bulb resulting in 50% proximal ICA stenosis. Left carotid system: Patent with extensive calcified plaque at the carotid bifurcation resulting in 65% proximal ICA stenosis. Vertebral arteries: Patent with calcified plaque at the right greater than left vertebral artery origins. No evidence of a significant stenosis or dissection. Codominant. Skeleton: No acute osseous abnormality or suspicious osseous lesion. Moderate lower cervical disc degeneration. Advanced left facet arthrosis at C3-4. Other neck: No evidence of cervical lymphadenopathy or mass. Upper chest: Clear lung apices. IMPRESSION: 1. 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 2. 50% proximal right ICA stenosis. 3. Widely patent vertebral arteries. 4. Aortic Atherosclerosis (ICD10-I70.0). Electronically Signed   By: Sebastian Ache M.D.   On: 04/24/2021 16:44   MR ANGIO HEAD WO CONTRAST  Result Date: 04/24/2021 CLINICAL DATA:  Follow-up examination for acute stroke. EXAM: MRA NECK WITHOUT AND WITH CONTRAST MRA HEAD WITHOUT CONTRAST TECHNIQUE: Multiplanar and multiecho pulse sequences of the neck were obtained without and with intravenous contrast. Angiographic images  of the neck were obtained using MRA technique without and with intravenous contrast; Angiographic images of the Circle of Willis were obtained using MRA technique without intravenous contrast. CONTRAST:  39mL GADAVIST GADOBUTROL 1 MMOL/ML IV SOLN COMPARISON:  Prior MRI from 04/23/2021. FINDINGS: MRA NECK FINDINGS AORTIC ARCH: Visualized aortic arch normal in caliber with normal 3 vessel morphology. No hemodynamically significant stenosis or other abnormality seen about the origin of the great vessels. RIGHT CAROTID SYSTEM: Right CCA patent from its origin to the bifurcation without stenosis. Atheromatous irregularity seen about the right carotid bulb/proximal right ICA. Associated stenosis of up to 40% by NASCET criteria. Right ICA otherwise patent distally to the skull base without stenosis, evidence for dissection or occlusion. LEFT CAROTID SYSTEM: Left CCA patent from its origin to the bifurcation without stenosis. Prominent atheromatous change about the left carotid bulb/proximal left ICA. Associated severe short-segment stenosis of up to 80% by NASCET criteria (series 303, image 5). Left ICA otherwise patent distally to the skull base without stenosis, evidence for dissection, or occlusion. VERTEBRAL ARTERIES: Both vertebral arteries arise from the subclavian arteries. No proximal subclavian artery stenosis. Vertebral arteries patent within the neck without stenosis, evidence for dissection or occlusion. Other: None. MRA HEAD FINDINGS ANTERIOR CIRCULATION: Both internal carotid arteries widely patent to the termini without stenosis. A1 segments widely patent. Normal anterior communicating artery complex. Both anterior cerebral arteries widely patent to their distal aspects without stenosis. No M1 stenosis or occlusion. Normal MCA bifurcations. Distal MCA branches well perfused and symmetric. POSTERIOR CIRCULATION: Both V4 segments patent to the vertebrobasilar junction without stenosis. Right vertebral artery  slightly dominant. Both PICA origins patent and normal. Basilar widely patent to its distal aspect without stenosis. Tiny 2 mm outpouching at the origin of the right AICA most consistent with a small vascular infundibulum. Superior cerebellar arteries patent bilaterally. Both PCAs primarily supplied via the basilar and are well perfused to there distal aspects. Small right posterior communicating artery noted. Anatomic variant: None significant. Other: None.  No intracranial aneurysm. IMPRESSION: MRA HEAD IMPRESSION: Normal intracranial MRA. No large vessel occlusion, hemodynamically significant stenosis, or other acute vascular abnormality. MRA NECK IMPRESSION: 1. Atheromatous change about the left carotid bulb/proximal left ICA with associated short-segment severe stenosis of up to 80% by NASCET criteria. 2. Atheromatous change about the right carotid bulb/proximal right ICA with associated stenosis of up to 40% by NASCET criteria. 3. Wide patency of both vertebral arteries within the neck. Electronically Signed   By: Janell Quiet.D.  On: 04/24/2021 05:54   MR ANGIO NECK W WO CONTRAST  Result Date: 04/24/2021 CLINICAL DATA:  Follow-up examination for acute stroke. EXAM: MRA NECK WITHOUT AND WITH CONTRAST MRA HEAD WITHOUT CONTRAST TECHNIQUE: Multiplanar and multiecho pulse sequences of the neck were obtained without and with intravenous contrast. Angiographic images of the neck were obtained using MRA technique without and with intravenous contrast; Angiographic images of the Circle of Willis were obtained using MRA technique without intravenous contrast. CONTRAST:  8mL GADAVIST GADOBUTROL 1 MMOL/ML IV SOLN COMPARISON:  Prior MRI from 04/23/2021. FINDINGS: MRA NECK FINDINGS AORTIC ARCH: Visualized aortic arch normal in caliber with normal 3 vessel morphology. No hemodynamically significant stenosis or other abnormality seen about the origin of the great vessels. RIGHT CAROTID SYSTEM: Right CCA patent  from its origin to the bifurcation without stenosis. Atheromatous irregularity seen about the right carotid bulb/proximal right ICA. Associated stenosis of up to 40% by NASCET criteria. Right ICA otherwise patent distally to the skull base without stenosis, evidence for dissection or occlusion. LEFT CAROTID SYSTEM: Left CCA patent from its origin to the bifurcation without stenosis. Prominent atheromatous change about the left carotid bulb/proximal left ICA. Associated severe short-segment stenosis of up to 80% by NASCET criteria (series 303, image 5). Left ICA otherwise patent distally to the skull base without stenosis, evidence for dissection, or occlusion. VERTEBRAL ARTERIES: Both vertebral arteries arise from the subclavian arteries. No proximal subclavian artery stenosis. Vertebral arteries patent within the neck without stenosis, evidence for dissection or occlusion. Other: None. MRA HEAD FINDINGS ANTERIOR CIRCULATION: Both internal carotid arteries widely patent to the termini without stenosis. A1 segments widely patent. Normal anterior communicating artery complex. Both anterior cerebral arteries widely patent to their distal aspects without stenosis. No M1 stenosis or occlusion. Normal MCA bifurcations. Distal MCA branches well perfused and symmetric. POSTERIOR CIRCULATION: Both V4 segments patent to the vertebrobasilar junction without stenosis. Right vertebral artery slightly dominant. Both PICA origins patent and normal. Basilar widely patent to its distal aspect without stenosis. Tiny 2 mm outpouching at the origin of the right AICA most consistent with a small vascular infundibulum. Superior cerebellar arteries patent bilaterally. Both PCAs primarily supplied via the basilar and are well perfused to there distal aspects. Small right posterior communicating artery noted. Anatomic variant: None significant. Other: None.  No intracranial aneurysm. IMPRESSION: MRA HEAD IMPRESSION: Normal intracranial MRA.  No large vessel occlusion, hemodynamically significant stenosis, or other acute vascular abnormality. MRA NECK IMPRESSION: 1. Atheromatous change about the left carotid bulb/proximal left ICA with associated short-segment severe stenosis of up to 80% by NASCET criteria. 2. Atheromatous change about the right carotid bulb/proximal right ICA with associated stenosis of up to 40% by NASCET criteria. 3. Wide patency of both vertebral arteries within the neck. Electronically Signed   By: Rise Mu M.D.   On: 04/24/2021 05:54   MR BRAIN WO CONTRAST  Result Date: 04/24/2021 CLINICAL DATA:  Initial evaluation for acute dizziness. EXAM: MRI HEAD WITHOUT CONTRAST TECHNIQUE: Multiplanar, multiecho pulse sequences of the brain and surrounding structures were obtained without intravenous contrast. COMPARISON:  Head CT from earlier the same day. FINDINGS: Brain: Cerebral volume within normal limits for age. Few scattered patchy subcentimeter T2/FLAIR hyperintensity noted about the periventricular and deep white matter both cerebral hemispheres, nonspecific, but most commonly related to chronic microvascular ischemic disease. Overall, changes are mild for age. Probable small remote lacunar infarct noted within the ventral right thalamus. Additional tiny remote lacunar infarct present at the right basal  ganglia. 8 mm focus of mild diffusion signal seen involving the subcortical/deep white matter of the anterior left frontal lobe (series 5, image 85). Associated prominent T2/FLAIR signal abnormality (series 11, image 20). No associated ADC correlate. Finding is nonspecific, but favored to reflect changes of evolving late subacute small vessel ischemia. No associated hemorrhage or mass effect. No other diffusion abnormality to suggest acute or subacute ischemia elsewhere within the brain. Gray-white matter differentiation otherwise maintained. No encephalomalacia to suggest chronic cortical infarction. No evidence for  acute or chronic intracranial hemorrhage. No mass lesion, midline shift or mass effect. No hydrocephalus or extra-axial fluid collection. Pituitary gland suprasellar region within normal limits. Midline structures intact and normal. Vascular: Major intracranial vascular flow voids are maintained. Skull and upper cervical spine: Craniocervical junction within normal limits. Bone marrow signal intensity normal. No scalp soft tissue abnormality. Sinuses/Orbits: Patient status post bilateral ocular lens replacement. Globes and orbital soft tissues demonstrate no acute finding. Paranasal sinuses are clear. Small left mastoid effusion noted. Inner ear structures grossly normal. Negative nasopharynx. Other: None. IMPRESSION: 1. 8 mm focus of mild diffusion signal abnormality involving the subcortical/deep white matter of the anterior left frontal lobe, nonspecific, but favored to reflect changes of evolving late subacute small vessel ischemia. No associated hemorrhage or mass effect. 2. No other acute intracranial abnormality. 3. Mild for age chronic microvascular ischemic disease with small remote lacunar infarcts involving the right thalamus and right basal ganglia. Electronically Signed   By: Rise Mu M.D.   On: 04/24/2021 01:28   ECHOCARDIOGRAM COMPLETE  Result Date: 04/24/2021    ECHOCARDIOGRAM REPORT   Patient Name:   Terry [name] Date of Exam: 04/24/2021 Medical Rec #:  [id number]     Height:       73.0 in Accession #:    [id number]    Weight:       184.0 lb Date of Birth:  05-31-1946     BSA:          2.077 m Patient Age:    74 years      BP:           164/61 mmHg Patient Gender: M             HR:           69 bpm. Exam Location:  Inpatient Procedure: 2D Echo, Cardiac Doppler and Color Doppler Indications:    Stroke I63.9  History:        Patient has prior history of Echocardiogram examinations, most                 recent 10/31/2019. Carotid Disease; Risk Factors:Dyslipidemia and                  Hypertension. Parkinsons.  Sonographer:    Leta Jungling RDCS Referring Phys: [id number] Angie Fava IMPRESSIONS  1. Left ventricular ejection fraction, by estimation, is 65 to 70%. The left ventricle has normal function. The left ventricle has no regional wall motion abnormalities. There is moderate left ventricular hypertrophy. Left ventricular diastolic parameters are indeterminate.  2. Right ventricular systolic function is normal. The right ventricular size is normal.  3. Left atrial size was mildly dilated.  4. Right atrial size was mildly dilated.  5. The mitral valve is grossly normal. No evidence of mitral valve regurgitation.  6. The aortic valve is tricuspid. Aortic valve regurgitation is not visualized. Comparison(s): Prior images reviewed side by side. No significant change in LVEF. FINDINGS  Left Ventricle: Left ventricular ejection fraction, by estimation, is 65 to 70%. The left ventricle has normal function. The left ventricle has no regional wall motion abnormalities. The left ventricular internal cavity size was normal in size. There is  moderate left ventricular hypertrophy. Left ventricular diastolic parameters are indeterminate. Right Ventricle: The right ventricular size is normal. No increase in right ventricular wall thickness. Right ventricular systolic function is normal. Left Atrium: Left atrial size was mildly dilated. Right Atrium: Right atrial size was mildly dilated. Pericardium: There is no evidence of pericardial effusion. Mitral Valve: The mitral valve is grossly normal. No evidence of mitral valve regurgitation. Tricuspid Valve: The tricuspid valve is grossly normal. Tricuspid valve regurgitation is mild. Aortic Valve: The aortic valve is tricuspid. There is mild to moderate aortic valve annular calcification. Aortic valve regurgitation is not visualized. Pulmonic Valve: The pulmonic valve was grossly normal. Pulmonic valve regurgitation is trivial. Aorta: The aortic root is  normal in size and structure. IAS/Shunts: The interatrial septum appears to be lipomatous. No atrial level shunt detected by color flow Doppler.  LEFT VENTRICLE PLAX 2D LVIDd:         4.60 cm  Diastology LV PW:         1.10 cm  LV e' medial:    6.00 cm/s LV IVS:        1.20 cm  LV E/e' medial:  11.4 LVOT diam:     2.00 cm  LV e' lateral:   6.08 cm/s LV SV:         62       LV E/e' lateral: 11.2 LV SV Index:   30 LVOT Area:     3.14 cm  RIGHT VENTRICLE RV S prime:     15.30 cm/s TAPSE (M-mode): 3.7 cm LEFT ATRIUM             Index       RIGHT ATRIUM           Index LA diam:        2.80 cm 1.35 cm/m  RA Area:     22.80 cm LA Vol (A2C):   58.5 ml 28.17 ml/m RA Volume:   71.10 ml  34.24 ml/m LA Vol (A4C):   75.2 ml 36.21 ml/m LA Biplane Vol: 67.9 ml 32.70 ml/m  AORTIC VALVE LVOT Vmax:   105.00 cm/s LVOT Vmean:  68.500 cm/s LVOT VTI:    0.197 m  AORTA Ao Root diam: 3.00 cm Ao Asc diam:  3.00 cm MITRAL VALVE MV Area (PHT): 3.16 cm    SHUNTS MV Decel Time: 240 msec    Systemic VTI:  0.20 m MV E velocity: 68.40 cm/s  Systemic Diam: 2.00 cm MV A velocity: 82.30 cm/s MV E/A ratio:  0.83 Nona Dell MD Electronically signed by Nona Dell MD Signature Date/Time: 04/24/2021/3:25:55 PM    Final     Scheduled Meds:  amLODipine  10 mg Oral Daily   aspirin EC  81 mg Oral Daily   atorvastatin  20 mg Oral QPM   carbidopa-levodopa  1 tablet Oral QHS   carbidopa-levodopa  2.5 tablet Oral QID   clopidogrel  75 mg Oral Daily   irbesartan  75 mg Oral Daily   pantoprazole  40 mg Oral Daily   rotigotine  1 patch Transdermal Daily   Continuous Infusions:   LOS: 0 days   Time spent: 29 minutes   Hughie Closs, MD [facility]  04/25/2021, 11:16 AM  Please page  via Amion and do not message via secure chat for anything urgent. Secure chat can be used for anything non urgent.  How to contact the [facility] Attending or Consulting provider 7A - 7P or covering provider during after hours 7P -7A, for this  patient?  Check the care team in [facility] and look for a) attending/consulting TRH provider listed and b) the [facility] team listed. Page or secure chat 7A-7P. Log into [email or web address] and use [place]'s universal password to access. If you do not have the password, please contact the hospital operator. Locate the [facility] provider you are looking for under [facility] and page to a number that you can be directly reached. If you still have difficulty reaching the provider, please page the [facility] (Director on Call) for the Hospitalists listed on amion for assistance.

## 2021-04-25 NOTE — Care Management Obs Status (Signed)
MEDICARE OBSERVATION STATUS NOTIFICATION   Patient Details  Name: Terry [name] MRN: [id number] Date of Birth: Nov 26, 1945   Medicare Observation Status Notification Given:  Yes    Lorri Frederick, LCSW 04/25/2021, 9:56 AM

## 2021-04-25 NOTE — Progress Notes (Signed)
Physical Therapy Treatment Patient Details Name: Terry [name] MRN: [id number] DOB: 24-Sep-1945 Today's Date: 04/25/2021   History of Present Illness Pt is a 75 y.o. male who presented 04/23/21 with 4 day hx of dizziness and L-sided headache. 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 PMH: Parkinson's disease, carotid artery stenosis, hypertension    PT Comments    Pt making excellent progress.  Reports he feels much better today. Pt ambulating at independent level.  PT focused on higher level balance activities.  Pt states he feels back to baseline - balance deficits are similar to ones he has had in past from Parkinsons.  He was getting outpt PT previously , recommend continuing.  Decreased frequency to 3xweek due to pt's high level, does not need 4 x week acute PT.    Recommendations for follow up therapy are one component of a multi-disciplinary discharge planning process, led by the attending physician.  Recommendations may be updated based on patient status, additional functional criteria and insurance authorization.  Follow Up Recommendations  Outpatient PT     Equipment Recommendations       Recommendations for Other Services       Precautions / Restrictions Precautions Precautions: Fall     Mobility  Bed Mobility Overal bed mobility: Modified Independent                  Transfers Overall transfer level: Independent Equipment used: None Transfers: Sit to/from Stand Sit to Stand: Independent         General transfer comment: Had supervision during therapy but has been mobilizing in room independently  Ambulation/Gait Ambulation/Gait assistance: Supervision Gait Distance (Feet): 500 Feet Assistive device: None Gait Pattern/deviations: Step-through pattern Gait  velocity: reduced   General Gait Details: Near normal gait pattern.  He did catch R toe twice but recovered independently.  Reports that is his normal from Parkinsons (how he found out he had Parkinsons was catching R toe)   Stairs Stairs: Yes Stairs assistance: Supervision Stair Management: Alternating pattern;One rail Left;Forwards Number of Stairs: 10 General stair comments: close supervision for safety   Wheelchair Mobility    Modified Rankin (Stroke Patients Only) Modified Rankin (Stroke Patients Only) Pre-Morbid Rankin Score: No significant disability Modified Rankin: Slight disability     Balance Overall balance assessment: Needs assistance Sitting-balance support: No upper extremity supported;Feet supported Sitting balance-Leahy Scale: Normal     Standing balance support: No upper extremity supported Standing balance-Leahy Scale: Good                 High Level Balance Comments: Ambulation challenged with : stepping over object, looking Up/down/L/R, changing speeds.  Static balance: Feet together EO and EC 30 seconds without difficulty, SLS 5 reps each side able to hold 3-15 seconds unsteady, recovery reactions x 10 each direction (had pt lean into therapist then therapist removed support unannounced and at varying intervals).  With recovery, pt did well forward and to L.  At times difficulty stepping with R foot when leaning R, and requiring min A with posterior 5/10 trials.            Cognition Arousal/Alertness: Awake/alert Behavior During Therapy: WFL for tasks assessed/performed Overall Cognitive Status: Within Functional Limits for tasks assessed  Exercises      General Comments        Pertinent Vitals/Pain Pain Assessment: No/denies pain    Home Living                      Prior Function            PT Goals (current goals can now be found in the care plan section) Progress  towards PT goals: Progressing toward goals    Frequency    Min 3X/week      PT Plan Frequency needs to be updated    Co-evaluation              AM-PAC PT "6 Clicks" Mobility   Outcome Measure  Help needed turning from your back to your side while in a flat bed without using bedrails?: None Help needed moving from lying on your back to sitting on the side of a flat bed without using bedrails?: None Help needed moving to and from a bed to a chair (including a wheelchair)?: None Help needed standing up from a chair using your arms (e.g., wheelchair or bedside chair)?: None Help needed to walk in hospital room?: None Help needed climbing 3-5 steps with a railing? : A Little 6 Click Score: 23    End of Session Equipment Utilized During Treatment: Gait belt Activity Tolerance: Patient tolerated treatment well Patient left: in bed;with call bell/phone within reach;with family/visitor present Nurse Communication: Mobility status PT Visit Diagnosis: Unsteadiness on feet (R26.81);Other abnormalities of gait and mobility (R26.89);Muscle weakness (generalized) (M62.81);History of falling (Z91.81);Difficulty in walking, not elsewhere classified (R26.2);Other symptoms and signs involving the nervous system (R29.898);Dizziness and giddiness (R42)     Time: 9675-9163 PT Time Calculation (min) (ACUTE ONLY): 19 min  Charges:  $Neuromuscular Re-education: 8-22 mins                     Anise Salvo, PT Acute Rehab Services Pager [phone] [facility] [phone]    Rayetta Humphrey 04/25/2021, 2:39 PM

## 2021-04-26 ENCOUNTER — Ambulatory Visit: Payer: Medicare PPO | Admitting: Physical Therapy

## 2021-04-26 DIAGNOSIS — I63232 Cerebral infarction due to unspecified occlusion or stenosis of left carotid arteries: Secondary | ICD-10-CM | POA: Diagnosis not present

## 2021-04-26 DIAGNOSIS — I6522 Occlusion and stenosis of left carotid artery: Secondary | ICD-10-CM

## 2021-04-26 LAB — BASIC METABOLIC PANEL
Anion gap: 7 (ref 5–15)
BUN: 15 mg/dL (ref 8–23)
CO2: 28 mmol/L (ref 22–32)
Calcium: 8.9 mg/dL (ref 8.9–10.3)
Chloride: 101 mmol/L (ref 98–111)
Creatinine, Ser: 0.92 mg/dL (ref 0.61–1.24)
GFR, Estimated: >60 mL/min (ref >60)
Glucose, Bld: 115 mg/dL — ABNORMAL HIGH (ref 70–99)
Potassium: 4.2 mmol/L (ref 3.5–5.1)
Sodium: 136 mmol/L (ref 135–145)

## 2021-04-26 LAB — CBC
HCT: 41.1 % (ref 39.0–52.0)
Hemoglobin: 13.8 g/dL (ref 13.0–17.0)
MCH: 29.8 pg (ref 26.0–34.0)
MCHC: 33.6 g/dL (ref 30.0–36.0)
MCV: 88.8 fL (ref 80.0–100.0)
Platelets: 186 10*3/uL (ref 150–400)
RBC: 4.63 MIL/uL (ref 4.22–5.81)
RDW: 13.7 % (ref 11.5–15.5)
WBC: 8.8 10*3/uL (ref 4.0–10.5)
nRBC: 0 % (ref 0.0–0.2)

## 2021-04-26 LAB — GLUCOSE, CAPILLARY: Glucose-Capillary: 110 mg/dL — ABNORMAL HIGH (ref 70–99)

## 2021-04-26 MED ORDER — VANCOMYCIN HCL IN DEXTROSE 1-5 GM/200ML-% IV SOLN
1000.0000 mg | INTRAVENOUS | Status: AC
  Administered 2021-04-27: 1000 mg via INTRAVENOUS
  Filled 2021-04-26: qty 200

## 2021-04-26 NOTE — H&P (View-Only) (Signed)
    Subjective  -   No acute events overnight.  He denies any new neurologic issues   Physical Exam:  Motor and sensory function grossly intact Nonlabored breathing       Assessment/Plan:    Symptomatic left carotid stenosis: The patient's wife was at the bedside today.  I discussed that based off of his CT scan, I think he is a better candidate for carotid endarterectomy.  I discussed the risk and benefits including risk of stroke, nerve injury, and bleeding.  All questions were answered and they wish to proceed.  He will be n.p.o. after midnight.  Wells Daisy Mcneel 04/26/2021 1:05 PM --  Vitals:   04/26/21 0925 04/26/21 1259  BP: (!) 134/56 138/72  Pulse: 62 70  Resp: 18 18  Temp: 97.8 F (36.6 C) 98 F (36.7 C)  SpO2: 94% 97%    Intake/Output Summary (Last 24 hours) at 04/26/2021 1305 Last data filed at 04/26/2021 0900 Gross per 24 hour  Intake 390 ml  Output 600 ml  Net -210 ml     Laboratory CBC    Component Value Date/Time   WBC 8.8 04/26/2021 1153   HGB 13.8 04/26/2021 1153   HGB 15.9 11/18/2019 1216   HCT 41.1 04/26/2021 1153   HCT 47.8 11/18/2019 1216   PLT 186 04/26/2021 1153   PLT 233 11/18/2019 1216    BMET    Component Value Date/Time   NA 136 04/26/2021 1153   NA 143 11/18/2019 1216   K 4.2 04/26/2021 1153   CL 101 04/26/2021 1153   CO2 28 04/26/2021 1153   GLUCOSE 115 (H) 04/26/2021 1153   BUN 15 04/26/2021 1153   BUN 23 11/18/2019 1216   CREATININE 0.92 04/26/2021 1153   CALCIUM 8.9 04/26/2021 1153   GFRNONAA >60 04/26/2021 1153   GFRAA 85 11/18/2019 1216    COAG No results found for: INR, PROTIME No results found for: PTT  Antibiotics Anti-infectives (From admission, onward)    Start     Dose/Rate Route Frequency Ordered Stop   04/27/21 0815  vancomycin (VANCOCIN) IVPB 1000 mg/200 mL premix        1,000 mg 200 mL/hr over 60 Minutes Intravenous To ShortStay Surgical 04/26/21 0657 04/28/21 0815        V. Charlena Cross, M.D., [facility] [place] Office: [phone] Pager:  [phone]

## 2021-04-26 NOTE — Progress Notes (Signed)
    Subjective  -   No acute events overnight.  He denies any new neurologic issues   Physical Exam:  Motor and sensory function grossly intact Nonlabored breathing       Assessment/Plan:    Symptomatic left carotid stenosis: The patient's wife was at the bedside today.  I discussed that based off of his CT scan, I think he is a better candidate for carotid endarterectomy.  I discussed the risk and benefits including risk of stroke, nerve injury, and bleeding.  All questions were answered and they wish to proceed.  He will be n.p.o. after midnight.  Terry [name] 04/26/2021 1:05 PM --  Vitals:   04/26/21 0925 04/26/21 1259  BP: (!) 134/56 138/72  Pulse: 62 70  Resp: 18 18  Temp: 97.8 F (36.6 C) 98 F (36.7 C)  SpO2: 94% 97%    Intake/Output Summary (Last 24 hours) at 04/26/2021 1305 Last data filed at 04/26/2021 0900 Gross per 24 hour  Intake 390 ml  Output 600 ml  Net -210 ml     Laboratory CBC    Component Value Date/Time   WBC 8.8 04/26/2021 1153   HGB 13.8 04/26/2021 1153   HGB 15.9 11/18/2019 1216   HCT 41.1 04/26/2021 1153   HCT 47.8 11/18/2019 1216   PLT 186 04/26/2021 1153   PLT 233 11/18/2019 1216    BMET    Component Value Date/Time   NA 136 04/26/2021 1153   NA 143 11/18/2019 1216   K 4.2 04/26/2021 1153   CL 101 04/26/2021 1153   CO2 28 04/26/2021 1153   GLUCOSE 115 (H) 04/26/2021 1153   BUN 15 04/26/2021 1153   BUN 23 11/18/2019 1216   CREATININE 0.92 04/26/2021 1153   CALCIUM 8.9 04/26/2021 1153   GFRNONAA >60 04/26/2021 1153   GFRAA 85 11/18/2019 1216    COAG No results found for: INR, PROTIME No results found for: PTT  Antibiotics Anti-infectives (From admission, onward)    Start     Dose/Rate Route Frequency Ordered Stop   04/27/21 0815  vancomycin (VANCOCIN) IVPB 1000 mg/200 mL premix        1,000 mg 200 mL/hr over 60 Minutes Intravenous To ShortStay Surgical 04/26/21 0657 04/28/21 0815        V. Terry [name]  IV, M.D., FACS [facility] [place] Office: [phone] Pager:  [phone]  

## 2021-04-26 NOTE — Progress Notes (Signed)
PROGRESS NOTE    Terry [name]  RDE:[id number] DOB: 1945/08/13 DOA: 04/23/2021 PCP: Georgianne Fick, MD   Brief Narrative:  Terry [name] is a 75 y.o. male with medical history significant for Parkinson's disease, carotid artery stenosis, hypertension, who is admitted to [facility] on 04/23/2021 with subacute ischemic CVA after presenting from home to [facility] ED complaining of dizziness and evidence of significantly elevated blood pressure since last 4 days.  Upon arrival to ED, his other vitals were stable except his blood pressure which was 241/94, COVID-19/influenza PCR were negative. Noncontrast CT of the head showed no evidence of acute intracranial process, including no evidence of intracranial hemorrhage.  Neurology was consulted, MRI confirmed subacute left frontal stroke.  Assessment & Plan:   Principal Problem:   Stroke [facility]) Active Problems:   Essential hypertension   Parkinson's disease (HCC)   GERD (gastroesophageal reflux disease)   Hypertensive crisis   Headache  Acute ischemic stroke: MRI confirms 8 mm subcortical/deep white matter anterior left frontal lobe infarct.  MR angiogram of neck shows high-grade left ICA stenosis of 80%.  Patient was not on any antiplatelet prior to admission.  He has been started on aspirin 81 mg p.o. daily.  Vascular surgery consulted.  CT angiogram of neck completed.  Carotid endarterectomy recommended and scheduled for Wednesday morning. Goal to keep systolic between 631-497.  Patient on amlodipine 10 mg with holding parameters.  Blood pressure at goal now.  Echo shows normal ejection fraction with no wall motion abnormality.  No PFO.  Hypokalemia: Replaced.  Checking BMP today.  Hyperlipidemia: Currently on atorvastatin 20 mg, will continue that.  LDL very low.  HDL low as well.  Parkinson's disease: Continue home medications.  GERD: Continue PPI  DVT prophylaxis: SCDs Start: 04/23/21 2354   Code Status: Full Code  Family  Communication: None at bedside.  Plan of care discussed with patient.  He voiced understanding.  Status is: Observation  The patient will require care spanning > 2 midnights and should be moved to inpatient because: Ongoing diagnostic testing needed not appropriate for outpatient work up  Dispo: The patient is from: Home              Anticipated d/c is to: Home              Patient currently is not medically stable to d/c.   Difficult to place patient No        Estimated body mass index is 23.27 kg/m as calculated from the following:   Height as of this encounter: 6\' 1"  (1.854 m).   Weight as of this encounter: 80 kg.     Nutritional Assessment: Body mass index is 23.27 kg/m. Seen by dietician.  I agree with the assessment and plan as outlined below: Nutrition Status:        .  Skin Assessment: I have examined the patient's skin and I agree with the wound assessment as performed by the wound care RN as outlined below:    Consultants:  Neurology Vascular surgery  Procedures:  None  Antimicrobials:  Anti-infectives (From admission, onward)    Start     Dose/Rate Route Frequency Ordered Stop   04/27/21 0815  vancomycin (VANCOCIN) IVPB 1000 mg/200 mL premix        1,000 mg 200 mL/hr over 60 Minutes Intravenous To ShortStay Surgical 04/26/21 0657 04/28/21 0815          Subjective: Seen and examined.  No complaints.  Objective: Vitals:  04/25/21 1631 04/25/21 2016 04/26/21 0500 04/26/21 0925  BP: (!) 160/67 (!) 147/74 138/71 (!) 134/56  Pulse: 75 72 61 62  Resp: 19   18  Temp: 97.7 F (36.5 C) 97.6 F (36.4 C) 97.9 F (36.6 C) 97.8 F (36.6 C)  TempSrc:   Oral Oral  SpO2: 97% 95% 96% 94%  Weight:      Height:        Intake/Output Summary (Last 24 hours) at 04/26/2021 1125 Last data filed at 04/26/2021 0900 Gross per 24 hour  Intake 390 ml  Output 600 ml  Net -210 ml    Filed Weights   04/23/21 2006 04/24/21 2109  Weight: 83.5 kg 80 kg     Examination:  General exam: Appears calm and comfortable  Respiratory system: Clear to auscultation. Respiratory effort normal. Cardiovascular system: S1 & S2 heard, RRR. No JVD, murmurs, rubs, gallops or clicks. No pedal edema. Gastrointestinal system: Abdomen is nondistended, soft and nontender. No organomegaly or masses felt. Normal bowel sounds heard. Central nervous system: Alert and oriented. No focal neurological deficits. Extremities: Symmetric 5 x 5 power. Skin: No rashes, lesions or ulcers.  Psychiatry: Judgement and insight appear normal. Mood & affect appropriate.   Data Reviewed: I have personally reviewed following labs and imaging studies  CBC: Recent Labs  Lab 04/20/21 2228 04/23/21 2023 04/24/21 0500  WBC 8.1 8.5 7.7  NEUTROABS  --  5.3  --   HGB 13.4 13.4 12.7*  HCT 40.5 41.2 39.1  MCV 89.6 90.4 90.5  PLT 180 182 166    Basic Metabolic Panel: Recent Labs  Lab 04/20/21 2228 04/23/21 2023 04/24/21 0500  NA 138 139 137  K 4.5 3.9 3.4*  CL 102 105 104  CO2 30 25 26   GLUCOSE 110* 101* 119*  BUN 13 18 17   CREATININE 0.95 0.96 0.96  CALCIUM 9.1 8.6* 8.5*  MG  --   --  2.0    GFR: Estimated Creatinine Clearance: 76.3 mL/min (by C-G formula based on SCr of 0.96 mg/dL). Liver Function Tests: Recent Labs  Lab 04/20/21 2228 04/23/21 2023 04/24/21 0500  AST 15 14* 14*  ALT <5 <5 <5  ALKPHOS 55 52 51  BILITOT 1.0 0.7 0.5  PROT 4.5* 6.5 5.9*  ALBUMIN 4.0 4.1 3.8    No results for input(s): LIPASE, AMYLASE in the last 168 hours. No results for input(s): AMMONIA in the last 168 hours. Coagulation Profile: No results for input(s): INR, PROTIME in the last 168 hours. Cardiac Enzymes: No results for input(s): CKTOTAL, CKMB, CKMBINDEX, TROPONINI in the last 168 hours. BNP (last 3 results) No results for input(s): PROBNP in the last 8760 hours. HbA1C: Recent Labs    04/24/21 0500  HGBA1C 5.4    CBG: No results for input(s): GLUCAP in the  last 168 hours. Lipid Profile: Recent Labs    04/24/21 0500  CHOL 91  HDL 37*  LDLCALC 25  TRIG 06/24/21  CHOLHDL 2.5    Thyroid Function Tests: Recent Labs    04/25/21 0559  TSH 1.272    Anemia Panel: No results for input(s): VITAMINB12, FOLATE, FERRITIN, TIBC, IRON, RETICCTPCT in the last 72 hours. Sepsis Labs: No results for input(s): PROCALCITON, LATICACIDVEN in the last 168 hours.  Recent Results (from the past 240 hour(s))  Resp Panel by RT-PCR (Flu A&B, Covid) Nasopharyngeal Swab     Status: None   Collection Time: 04/24/21 12:35 AM   Specimen: Nasopharyngeal Swab; Nasopharyngeal(NP) swabs in vial transport  medium  Result Value Ref Range Status   SARS Coronavirus 2 by RT PCR NEGATIVE NEGATIVE Final    Comment: (NOTE) SARS-CoV-2 target nucleic acids are NOT DETECTED.  The SARS-CoV-2 RNA is generally detectable in upper respiratory specimens during the acute phase of infection. The lowest concentration of SARS-CoV-2 viral copies this assay can detect is 138 copies/mL. A negative result does not preclude SARS-Cov-2 infection and should not be used as the sole basis for treatment or other patient management decisions. A negative result may occur with  improper specimen collection/handling, submission of specimen other than nasopharyngeal swab, presence of viral mutation(s) within the areas targeted by this assay, and inadequate number of viral copies(<138 copies/mL). A negative result must be combined with clinical observations, patient history, and epidemiological information. The expected result is Negative.  Fact Sheet for Patients:  BloggerCourse.com  Fact Sheet for Healthcare Providers:  SeriousBroker.it  This test is no t yet approved or cleared by the [place] FDA and  has been authorized for detection and/or diagnosis of SARS-CoV-2 by FDA under an Emergency Use Authorization (EUA). This EUA will remain   in effect (meaning this test can be used) for the duration of the COVID-19 declaration under Section 564(b)(1) of the Act, 21 U.S.C.section 360bbb-3(b)(1), unless the authorization is terminated  or revoked sooner.       Influenza A by PCR NEGATIVE NEGATIVE Final   Influenza B by PCR NEGATIVE NEGATIVE Final    Comment: (NOTE) The Xpert Xpress SARS-CoV-2/FLU/RSV plus assay is intended as an aid in the diagnosis of influenza from Nasopharyngeal swab specimens and should not be used as a sole basis for treatment. Nasal washings and aspirates are unacceptable for Xpert Xpress SARS-CoV-2/FLU/RSV testing.  Fact Sheet for Patients: BloggerCourse.com  Fact Sheet for Healthcare Providers: SeriousBroker.it  This test is not yet approved or cleared by the [place] FDA and has been authorized for detection and/or diagnosis of SARS-CoV-2 by FDA under an Emergency Use Authorization (EUA). This EUA will remain in effect (meaning this test can be used) for the duration of the COVID-19 declaration under Section 564(b)(1) of the Act, 21 U.S.C. section 360bbb-3(b)(1), unless the authorization is terminated or revoked.  Performed at [facility], [address] [place], [place] [address]        Radiology Studies: CT ANGIO NECK W OR WO CONTRAST  Result Date: 04/24/2021 CLINICAL DATA:  Stroke/TIA, assess extracranial arteries. EXAM: CT ANGIOGRAPHY NECK TECHNIQUE: Multidetector CT imaging of the neck was performed using the standard protocol during bolus administration of intravenous contrast. Multiplanar CT image reconstructions and MIPs were obtained to evaluate the vascular anatomy. Carotid stenosis measurements (when applicable) are obtained utilizing NASCET criteria, using the distal internal carotid diameter as the denominator. CONTRAST:  9mL OMNIPAQUE IOHEXOL 350 MG/ML SOLN COMPARISON:  Neck MRA 04/24/2021 FINDINGS: Aortic arch:  Standard 3 vessel aortic arch with mild atherosclerotic plaque. Widely patent brachiocephalic and subclavian arteries. Right carotid system: Patent with extensive calcified plaque at the carotid bifurcation and in the carotid bulb resulting in 50% proximal ICA stenosis. Left carotid system: Patent with extensive calcified plaque at the carotid bifurcation resulting in 65% proximal ICA stenosis. Vertebral arteries: Patent with calcified plaque at the right greater than left vertebral artery origins. No evidence of a significant stenosis or dissection. Codominant. Skeleton: No acute osseous abnormality or suspicious osseous lesion. Moderate lower cervical disc degeneration. Advanced left facet arthrosis at C3-4. Other neck: No evidence of cervical lymphadenopathy or mass. Upper  chest: Clear lung apices. IMPRESSION: 1. 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 2. 50% proximal right ICA stenosis. 3. Widely patent vertebral arteries. 4. Aortic Atherosclerosis (ICD10-I70.0). Electronically Signed   By: Sebastian Ache M.D.   On: 04/24/2021 16:44    Scheduled Meds:  amLODipine  10 mg Oral Daily   aspirin EC  81 mg Oral Daily   atorvastatin  20 mg Oral QPM   carbidopa-levodopa  1 tablet Oral QHS   carbidopa-levodopa  2.5 tablet Oral QID   clopidogrel  75 mg Oral Daily   irbesartan  75 mg Oral Daily   pantoprazole  40 mg Oral Daily   rotigotine  1 patch Transdermal Daily   Continuous Infusions:  [START ON 04/27/2021] vancomycin       LOS: 1 day   Time spent: 27 minutes   Hughie Closs, MD [facility]  04/26/2021, 11:25 AM  Please page via Loretha Stapler and do not message via secure chat for anything urgent. Secure chat can be used for anything non urgent.  How to contact the [facility] Attending or Consulting provider 7A - 7P or covering provider during after hours 7P -7A, for this patient?  Check the care team in [facility] and look for a) attending/consulting TRH provider listed and b) the [facility] team listed. Page or secure  chat 7A-7P. Log into [email or web address] and use [place]'s universal password to access. If you do not have the password, please contact the hospital operator. Locate the [facility] provider you are looking for under [facility] and page to a number that you can be directly reached. If you still have difficulty reaching the provider, please page the [facility] (Director on Call) for the Hospitalists listed on amion for assistance.

## 2021-04-26 NOTE — Progress Notes (Signed)
   Symptomatic left ICA stenosis NPO past MN for left CEA by Dr. Myra Gianotti 04/27/21  Mosetta Pigeon PA-C

## 2021-04-26 NOTE — Plan of Care (Signed)
  Problem: Education: Goal: Knowledge of General Education information will improve Description Including pain rating scale, medication(s)/side effects and non-pharmacologic comfort measures Outcome: Progressing   Problem: Health Behavior/Discharge Planning: Goal: Ability to manage health-related needs will improve Outcome: Progressing   

## 2021-04-27 ENCOUNTER — Inpatient Hospital Stay (HOSPITAL_COMMUNITY): Payer: Medicare PPO | Admitting: Certified Registered"

## 2021-04-27 ENCOUNTER — Encounter (HOSPITAL_COMMUNITY): Payer: Self-pay | Admitting: Family Medicine

## 2021-04-27 ENCOUNTER — Encounter (HOSPITAL_COMMUNITY)
Admission: EM | Disposition: A | Discharge status: Home / Self Care | Payer: Self-pay | Source: Home / Self Care | Attending: Family Medicine

## 2021-04-27 DIAGNOSIS — I6502 Occlusion and stenosis of left vertebral artery: Secondary | ICD-10-CM

## 2021-04-27 DIAGNOSIS — I63232 Cerebral infarction due to unspecified occlusion or stenosis of left carotid arteries: Secondary | ICD-10-CM | POA: Diagnosis not present

## 2021-04-27 HISTORY — PX: ENDARTERECTOMY: SHX5162

## 2021-04-27 HISTORY — PX: PATCH ANGIOPLASTY: SHX6230

## 2021-04-27 LAB — TYPE AND SCREEN
ABO/RH(D): O POS
Antibody Screen: NEGATIVE

## 2021-04-27 LAB — CBC
HCT: 39.8 % (ref 39.0–52.0)
Hemoglobin: 13.3 g/dL (ref 13.0–17.0)
MCH: 29.7 pg (ref 26.0–34.0)
MCHC: 33.4 g/dL (ref 30.0–36.0)
MCV: 88.8 fL (ref 80.0–100.0)
Platelets: 180 10*3/uL (ref 150–400)
RBC: 4.48 MIL/uL (ref 4.22–5.81)
RDW: 13.3 % (ref 11.5–15.5)
WBC: 14.1 10*3/uL — ABNORMAL HIGH (ref 4.0–10.5)
nRBC: 0 % (ref 0.0–0.2)

## 2021-04-27 LAB — ABO/RH: ABO/RH(D): O POS

## 2021-04-27 LAB — POCT ACTIVATED CLOTTING TIME: Activated Clotting Time: 260 seconds

## 2021-04-27 LAB — CREATININE, SERUM
Creatinine, Ser: 0.97 mg/dL (ref 0.61–1.24)
GFR, Estimated: >60 mL/min (ref >60)

## 2021-04-27 SURGERY — ENDARTERECTOMY, CAROTID
Anesthesia: General | Site: Neck | Laterality: Left

## 2021-04-27 MED ORDER — SODIUM CHLORIDE 0.9 % IV SOLN: INTRAVENOUS | Status: DC

## 2021-04-27 MED ORDER — BISACODYL 5 MG PO TBEC: 5.0000 mg | DELAYED_RELEASE_TABLET | Freq: Every day | ORAL | Status: DC | PRN

## 2021-04-27 MED ORDER — ROCURONIUM BROMIDE 10 MG/ML (PF) SYRINGE
PREFILLED_SYRINGE | INTRAVENOUS | Status: DC | PRN
  Administered 2021-04-27: 50 mg via INTRAVENOUS

## 2021-04-27 MED ORDER — GUAIFENESIN-DM 100-10 MG/5ML PO SYRP: 15.0000 mL | ORAL_SOLUTION | ORAL | Status: DC | PRN

## 2021-04-27 MED ORDER — FENTANYL CITRATE (PF) 250 MCG/5ML IJ SOLN
INTRAMUSCULAR | Status: DC | PRN
  Administered 2021-04-27: 100 ug via INTRAVENOUS

## 2021-04-27 MED ORDER — SODIUM CHLORIDE 0.9 % IV SOLN: 500.0000 mL | Freq: Once | INTRAVENOUS | Status: DC | PRN

## 2021-04-27 MED ORDER — HEPARIN SODIUM (PORCINE) 5000 UNIT/ML IJ SOLN
5000.0000 [IU] | Freq: Three times a day (TID) | INTRAMUSCULAR | Status: DC
  Administered 2021-04-28: 5000 [IU] via SUBCUTANEOUS
  Filled 2021-04-27: qty 1

## 2021-04-27 MED ORDER — ONDANSETRON HCL 4 MG/2ML IJ SOLN
INTRAMUSCULAR | Status: DC | PRN
  Administered 2021-04-27: 4 mg via INTRAVENOUS

## 2021-04-27 MED ORDER — PHENYLEPHRINE HCL-NACL 20-0.9 MG/250ML-% IV SOLN
INTRAVENOUS | Status: DC | PRN
  Administered 2021-04-27: 30 ug/min via INTRAVENOUS

## 2021-04-27 MED ORDER — LIDOCAINE HCL (PF) 1 % IJ SOLN: INTRAMUSCULAR | Status: DC | PRN

## 2021-04-27 MED ORDER — FENTANYL CITRATE (PF) 100 MCG/2ML IJ SOLN
INTRAMUSCULAR | Status: AC
  Administered 2021-04-27: 25 ug via INTRAVENOUS
  Filled 2021-04-27: qty 2

## 2021-04-27 MED ORDER — ROCURONIUM BROMIDE 10 MG/ML (PF) SYRINGE
PREFILLED_SYRINGE | INTRAVENOUS | Status: AC
  Filled 2021-04-27: qty 10

## 2021-04-27 MED ORDER — HEMOSTATIC AGENTS (NO CHARGE) OPTIME
TOPICAL | Status: DC | PRN
  Administered 2021-04-27: 1 via TOPICAL

## 2021-04-27 MED ORDER — SODIUM CHLORIDE 0.9 % IV SOLN
0.0125 ug/kg/min | INTRAVENOUS | Status: AC
  Administered 2021-04-27: .05 ug/kg/min via INTRAVENOUS
  Administered 2021-04-27: .1 ug/kg/min via INTRAVENOUS
  Filled 2021-04-27: qty 1000

## 2021-04-27 MED ORDER — ONDANSETRON HCL 4 MG/2ML IJ SOLN: 4.0000 mg | Freq: Four times a day (QID) | INTRAMUSCULAR | Status: DC | PRN

## 2021-04-27 MED ORDER — POTASSIUM CHLORIDE CRYS ER 20 MEQ PO TBCR: 20.0000 meq | EXTENDED_RELEASE_TABLET | Freq: Every day | ORAL | Status: DC | PRN

## 2021-04-27 MED ORDER — PHENOL 1.4 % MT LIQD: 1.0000 | OROMUCOSAL | Status: DC | PRN

## 2021-04-27 MED ORDER — FENTANYL CITRATE (PF) 100 MCG/2ML IJ SOLN
25.0000 ug | INTRAMUSCULAR | Status: DC | PRN
  Administered 2021-04-27: 25 ug via INTRAVENOUS

## 2021-04-27 MED ORDER — LIDOCAINE 2% (20 MG/ML) 5 ML SYRINGE
INTRAMUSCULAR | Status: AC
  Filled 2021-04-27: qty 5

## 2021-04-27 MED ORDER — CLEVIDIPINE BUTYRATE 0.5 MG/ML IV EMUL
INTRAVENOUS | Status: DC | PRN
  Administered 2021-04-27: 6 mg/h via INTRAVENOUS

## 2021-04-27 MED ORDER — LABETALOL HCL 5 MG/ML IV SOLN
10.0000 mg | INTRAVENOUS | Status: DC | PRN
Start: 2021-04-27 — End: 2021-04-28

## 2021-04-27 MED ORDER — GLYCOPYRROLATE PF 0.2 MG/ML IJ SOSY
PREFILLED_SYRINGE | INTRAMUSCULAR | Status: DC | PRN
  Administered 2021-04-27: .2 mg via INTRAVENOUS

## 2021-04-27 MED ORDER — PROTAMINE SULFATE 10 MG/ML IV SOLN
INTRAVENOUS | Status: DC | PRN
  Administered 2021-04-27 (×2): 20 mg via INTRAVENOUS
  Administered 2021-04-27: 10 mg via INTRAVENOUS

## 2021-04-27 MED ORDER — SUGAMMADEX SODIUM 200 MG/2ML IV SOLN
INTRAVENOUS | Status: DC | PRN
Start: 2021-04-27 — End: 2021-04-27
  Administered 2021-04-27: 200 mg via INTRAVENOUS

## 2021-04-27 MED ORDER — HYDROMORPHONE HCL 1 MG/ML IJ SOLN
0.5000 mg | INTRAMUSCULAR | Status: DC | PRN
  Administered 2021-04-27 (×2): 1 mg via INTRAVENOUS
  Administered 2021-04-27: 0.5 mg via INTRAVENOUS
  Filled 2021-04-27 (×3): qty 1

## 2021-04-27 MED ORDER — OXYCODONE-ACETAMINOPHEN 5-325 MG PO TABS: 1.0000 | ORAL_TABLET | ORAL | Status: DC | PRN

## 2021-04-27 MED ORDER — DOCUSATE SODIUM 100 MG PO CAPS
100.0000 mg | ORAL_CAPSULE | Freq: Every day | ORAL | Status: DC
  Administered 2021-04-28: 100 mg via ORAL
  Filled 2021-04-27: qty 1

## 2021-04-27 MED ORDER — HEPARIN SODIUM (PORCINE) 1000 UNIT/ML IJ SOLN
INTRAMUSCULAR | Status: AC
  Filled 2021-04-27: qty 1

## 2021-04-27 MED ORDER — LACTATED RINGERS IV SOLN: INTRAVENOUS | Status: DC | PRN

## 2021-04-27 MED ORDER — DEXAMETHASONE SODIUM PHOSPHATE 10 MG/ML IJ SOLN
INTRAMUSCULAR | Status: DC | PRN
  Administered 2021-04-27: 10 mg via INTRAVENOUS

## 2021-04-27 MED ORDER — SENNOSIDES-DOCUSATE SODIUM 8.6-50 MG PO TABS: 1.0000 | ORAL_TABLET | Freq: Every evening | ORAL | Status: DC | PRN

## 2021-04-27 MED ORDER — PROPOFOL 10 MG/ML IV BOLUS
INTRAVENOUS | Status: DC | PRN
  Administered 2021-04-27: 130 mg via INTRAVENOUS

## 2021-04-27 MED ORDER — DEXAMETHASONE SODIUM PHOSPHATE 10 MG/ML IJ SOLN
INTRAMUSCULAR | Status: AC
  Filled 2021-04-27: qty 1

## 2021-04-27 MED ORDER — FENTANYL CITRATE (PF) 250 MCG/5ML IJ SOLN
INTRAMUSCULAR | Status: AC
  Filled 2021-04-27: qty 5

## 2021-04-27 MED ORDER — PROPOFOL 10 MG/ML IV BOLUS
INTRAVENOUS | Status: AC
  Filled 2021-04-27: qty 20

## 2021-04-27 MED ORDER — MAGNESIUM SULFATE 2 GM/50ML IV SOLN: 2.0000 g | Freq: Every day | INTRAVENOUS | Status: DC | PRN

## 2021-04-27 MED ORDER — LIDOCAINE 2% (20 MG/ML) 5 ML SYRINGE
INTRAMUSCULAR | Status: DC | PRN
  Administered 2021-04-27: 60 mg via INTRAVENOUS

## 2021-04-27 MED ORDER — HEPARIN SODIUM (PORCINE) 1000 UNIT/ML IJ SOLN
INTRAMUSCULAR | Status: DC | PRN
  Administered 2021-04-27: 8000 [IU] via INTRAVENOUS

## 2021-04-27 MED ORDER — 0.9 % SODIUM CHLORIDE (POUR BTL) OPTIME
TOPICAL | Status: DC | PRN
  Administered 2021-04-27: 2000 mL

## 2021-04-27 MED ORDER — HEPARIN 6000 UNIT IRRIGATION SOLUTION
Status: DC | PRN
  Administered 2021-04-27: 1

## 2021-04-27 SURGICAL SUPPLY — 52 items
BAG COUNTER SPONGE SURGICOUNT (BAG) ×2 IMPLANT
CANISTER SUCT 3000ML PPV (MISCELLANEOUS) ×2 IMPLANT
CATH ROBINSON RED A/P 18FR (CATHETERS) ×2 IMPLANT
CATH SUCT 10FR WHISTLE TIP (CATHETERS) ×2 IMPLANT
CATH SUCT ARGYLE CHIMNEY 10FR (CATHETERS) IMPLANT
CLIP VESOCCLUDE MED 6/CT (CLIP) ×2 IMPLANT
CLIP VESOCCLUDE SM WIDE 6/CT (CLIP) ×2 IMPLANT
COVER PROBE W GEL 5X96 (DRAPES) IMPLANT
DERMABOND ADVANCED (GAUZE/BANDAGES/DRESSINGS) ×1
DERMABOND ADVANCED .7 DNX12 (GAUZE/BANDAGES/DRESSINGS) ×1 IMPLANT
DRAIN CHANNEL 15F RND FF W/TCR (WOUND CARE) IMPLANT
ELECT REM PT RETURN 9FT ADLT (ELECTROSURGICAL) ×2
ELECTRODE REM PT RTRN 9FT ADLT (ELECTROSURGICAL) ×1 IMPLANT
EVACUATOR SILICONE 100CC (DRAIN) IMPLANT
GAUZE 4X4 16PLY ~~LOC~~+RFID DBL (SPONGE) ×1 IMPLANT
GLOVE SRG 8 PF TXTR STRL LF DI (GLOVE) ×1 IMPLANT
GLOVE SURG POLYISO LF SZ7.5 (GLOVE) ×2 IMPLANT
GLOVE SURG UNDER POLY LF SZ8 (GLOVE) ×1
GOWN STRL REUS W/ TWL LRG LVL3 (GOWN DISPOSABLE) ×2 IMPLANT
GOWN STRL REUS W/ TWL XL LVL3 (GOWN DISPOSABLE) ×1 IMPLANT
GOWN STRL REUS W/TWL LRG LVL3 (GOWN DISPOSABLE) ×2
GOWN STRL REUS W/TWL XL LVL3 (GOWN DISPOSABLE) ×1
HEMOSTAT SNOW SURGICEL 2X4 (HEMOSTASIS) IMPLANT
INSERT FOGARTY SM (MISCELLANEOUS) IMPLANT
KIT BASIN OR (CUSTOM PROCEDURE TRAY) ×2 IMPLANT
KIT SHUNT ARGYLE CAROTID ART 6 (VASCULAR PRODUCTS) IMPLANT
KIT SUCTION CATH 10FR (CATHETERS) ×1
KIT TURNOVER KIT B (KITS) ×2 IMPLANT
NDL HYPO 25GX1X1/2 BEV (NEEDLE) IMPLANT
NEEDLE HYPO 25GX1X1/2 BEV (NEEDLE) IMPLANT
NS IRRIG 1000ML POUR BTL (IV SOLUTION) ×6 IMPLANT
PACK CAROTID (CUSTOM PROCEDURE TRAY) ×2 IMPLANT
PAD ARMBOARD 7.5X6 YLW CONV (MISCELLANEOUS) ×4 IMPLANT
PATCH VASC XENOSURE 1CMX6CM (Vascular Products) ×1 IMPLANT
PATCH VASC XENOSURE 1X6 (Vascular Products) IMPLANT
POSITIONER HEAD DONUT 9IN (MISCELLANEOUS) ×2 IMPLANT
SET WALTER ACTIVATION W/DRAPE (SET/KITS/TRAYS/PACK) IMPLANT
SHUNT CAROTID BYPASS 10 (VASCULAR PRODUCTS) IMPLANT
SHUNT CAROTID BYPASS 12FRX15.5 (VASCULAR PRODUCTS) IMPLANT
SPONGE T-LAP 18X18 ~~LOC~~+RFID (SPONGE) ×2 IMPLANT
SURGIFLO W/THROMBIN 8M KIT (HEMOSTASIS) ×1 IMPLANT
SUT ETHILON 3 0 PS 1 (SUTURE) IMPLANT
SUT PROLENE 6 0 BV (SUTURE) ×6 IMPLANT
SUT PROLENE 7 0 BV1 MDA (SUTURE) ×1 IMPLANT
SUT SILK 3 0 (SUTURE)
SUT SILK 3-0 18XBRD TIE 12 (SUTURE) IMPLANT
SUT VIC AB 3-0 SH 27 (SUTURE) ×2
SUT VIC AB 3-0 SH 27X BRD (SUTURE) ×2 IMPLANT
SUT VIC AB 3-0 X1 27 (SUTURE) ×2 IMPLANT
SYR CONTROL 10ML LL (SYRINGE) ×1 IMPLANT
TOWEL GREEN STERILE (TOWEL DISPOSABLE) ×2 IMPLANT
WATER STERILE IRR 1000ML POUR (IV SOLUTION) ×2 IMPLANT

## 2021-04-27 NOTE — Anesthesia Procedure Notes (Signed)
Procedure Name: Intubation Date/Time: 04/27/2021 8:42 AM Performed by: Myna Bright, CRNA Pre-anesthesia Checklist: Patient identified, Emergency Drugs available, Suction available and Patient being monitored Patient Re-evaluated:Patient Re-evaluated prior to induction Oxygen Delivery Method: Circle system utilized Preoxygenation: Pre-oxygenation with 100% oxygen Induction Type: IV induction Ventilation: Mask ventilation without difficulty Laryngoscope Size: Mac and 4 Grade View: Grade II Tube type: Oral Tube size: 7.5 mm Number of attempts: 1 Airway Equipment and Method: Stylet Placement Confirmation: ETT inserted through vocal cords under direct vision, positive ETCO2 and breath sounds checked- equal and bilateral Secured at: 22 cm Tube secured with: Tape Dental Injury: Teeth and Oropharynx as per pre-operative assessment

## 2021-04-27 NOTE — Progress Notes (Signed)
Pt arrived from PACU, VSS, CHG complete, orders released, neck level 0, oriented to unit, call light within reach.   Kalman Jewels, RN 04/27/2021 2:13 PM

## 2021-04-27 NOTE — Anesthesia Postprocedure Evaluation (Signed)
Anesthesia Post Note  Patient: Terry [name]  Procedure(s) Performed: LEFT CAROTID ENDARTERECTOMY (Left: Neck) PATCH ANGIOPLASTY USING XENOSURE BIOLOGIC PATCH (Left: Neck)     Patient location during evaluation: PACU Anesthesia Type: General Level of consciousness: awake and alert Pain management: pain level controlled Vital Signs Assessment: post-procedure vital signs reviewed and stable Respiratory status: spontaneous breathing, nonlabored ventilation, respiratory function stable and patient connected to nasal cannula oxygen Cardiovascular status: blood pressure returned to baseline and stable Postop Assessment: no apparent nausea or vomiting Anesthetic complications: no   No notable events documented.  Last Vitals:  Vitals:   04/27/21 1500 04/27/21 1600  BP: (!) 164/83 (!) 154/70  Pulse: 80 79  Resp: 14 14  Temp:  36.8 C  SpO2: 97% 97%    Last Pain:  Vitals:   04/27/21 1734  TempSrc:   PainSc: 7                  Kennieth Rad

## 2021-04-27 NOTE — Anesthesia Procedure Notes (Signed)
Arterial Line Insertion Start/End10/11/2020 8:00 AM, 04/27/2021 8:10 AM Performed by: Lucinda Dell, CRNA, CRNA  Patient location: Pre-op. Preanesthetic checklist: patient identified, IV checked, site marked, risks and benefits discussed, surgical consent, monitors and equipment checked, pre-op evaluation and timeout performed Lidocaine 1% used for infiltration Left, radial was placed Catheter size: 20 G Hand hygiene performed  and maximum sterile barriers used  Allen's test indicative of satisfactory collateral circulation Attempts: 1 Procedure performed without using ultrasound guided technique. Following insertion, dressing applied and Biopatch. Post procedure assessment: normal  Patient tolerated the procedure well with no immediate complications.

## 2021-04-27 NOTE — Anesthesia Preprocedure Evaluation (Signed)
Anesthesia Evaluation  Patient identified by MRN, date of birth, ID band Patient awake    Reviewed: Allergy & Precautions, NPO status , Patient's Chart, lab work & pertinent test results  Airway Mallampati: II  TM Distance: >3 FB Neck ROM: Full    Dental  (+) Dental Advisory Given   Pulmonary neg pulmonary ROS,    breath sounds clear to auscultation       Cardiovascular hypertension, Pt. on medications  Rhythm:Regular Rate:Normal     Neuro/Psych  Headaches, Parkinsons dz  CVA    GI/Hepatic Neg liver ROS, GERD  Medicated,  Endo/Other  negative endocrine ROS  Renal/GU negative Renal ROS     Musculoskeletal   Abdominal   Peds  Hematology negative hematology ROS (+)   Anesthesia Other Findings   Reproductive/Obstetrics                             Lab Results  Component Value Date   WBC 8.8 04/26/2021   HGB 13.8 04/26/2021   HCT 41.1 04/26/2021   MCV 88.8 04/26/2021   PLT 186 04/26/2021   Lab Results  Component Value Date   CREATININE 0.92 04/26/2021   BUN 15 04/26/2021   NA 136 04/26/2021   K 4.2 04/26/2021   CL 101 04/26/2021   CO2 28 04/26/2021    Anesthesia Physical Anesthesia Plan  ASA: 3  Anesthesia Plan: General   Post-op Pain Management:    Induction: Intravenous  PONV Risk Score and Plan: 2 and Dexamethasone, Ondansetron and Treatment may vary due to age or medical condition  Airway Management Planned: Oral ETT  Additional Equipment: Arterial line  Intra-op Plan:   Post-operative Plan: Extubation in OR  Informed Consent: I have reviewed the patients History and Physical, chart, labs and discussed the procedure including the risks, benefits and alternatives for the proposed anesthesia with the patient or authorized representative who has indicated his/her understanding and acceptance.     Dental advisory given  Plan Discussed with: CRNA  Anesthesia  Plan Comments:         Anesthesia Quick Evaluation

## 2021-04-27 NOTE — Interval H&P Note (Signed)
History and Physical Interval Note:  04/27/2021 7:58 AM  Terry [name]  has presented today for surgery, with the diagnosis of CAROTID ARTERY STENOSIS.  The various methods of treatment have been discussed with the patient and family. After consideration of risks, benefits and other options for treatment, the patient has consented to  Procedure(s): LEFT CAROTID ENDARTERECTOMY (Left) as a surgical intervention.  The patient's history has been reviewed, patient examined, no change in status, stable for surgery.  I have reviewed the patient's chart and labs.  Questions were answered to the patient's satisfaction.     Durene Cal

## 2021-04-27 NOTE — Progress Notes (Signed)
PROGRESS NOTE    Chozen Latulippe  XTG:[id number] DOB: 07-27-1945 DOA: 04/23/2021 PCP: Georgianne Fick, MD   Brief Narrative:  Terry [name] is a 75 y.o. male with medical history significant for Parkinson's disease, carotid artery stenosis, hypertension, who is admitted to [facility] on 04/23/2021 with subacute ischemic CVA after presenting from home to [facility] ED complaining of dizziness and evidence of significantly elevated blood pressure since last 4 days.  Upon arrival to ED, his other vitals were stable except his blood pressure which was 241/94, COVID-19/influenza PCR were negative. Noncontrast CT of the head showed no evidence of acute intracranial process, including no evidence of intracranial hemorrhage.  Neurology was consulted, MRI confirmed subacute left frontal stroke.  Assessment & Plan:   Principal Problem:   Stroke [facility]) Active Problems:   Essential hypertension   Parkinson's disease (HCC)   GERD (gastroesophageal reflux disease)   Hypertensive crisis   Headache  Acute ischemic stroke: MRI confirms 8 mm subcortical/deep white matter anterior left frontal lobe infarct.  MR angiogram of neck shows high-grade left ICA stenosis of 80%.  Patient was not on any antiplatelet prior to admission.  He has been started on aspirin 81 mg p.o. daily.  Vascular surgery consulted.  CT angiogram of neck completed.  Carotid endarterectomy is a scheduled for today.  Goal to keep systolic between 270-350.  Patient on amlodipine 10 mg with holding parameters.  Blood pressure at goal now.  Echo shows normal ejection fraction with no wall motion abnormality.  No PFO.  Hypokalemia: Resolved  Hyperlipidemia: Currently on atorvastatin 20 mg, will continue that.  LDL very low.  HDL low as well.  Parkinson's disease: Continue home medications.  GERD: Continue PPI  DVT prophylaxis: SCDs Start: 04/23/21 2354   Code Status: Full Code  Family Communication: None at bedside.  Plan of care  discussed with patient.  He voiced understanding.  Status is: Inpatient  Remains inpatient appropriate because:Inpatient level of care appropriate due to severity of illness  Dispo: The patient is from: Home              Anticipated d/c is to: Home              Patient currently is not medically stable to d/c.   Difficult to place patient No        Estimated body mass index is 23.12 kg/m as calculated from the following:   Height as of this encounter: 6\' 1"  (1.854 m).   Weight as of this encounter: 79.5 kg.     Nutritional Assessment: Body mass index is 23.12 kg/m. Seen by dietician.  I agree with the assessment and plan as outlined below: Nutrition Status:        .  Skin Assessment: I have examined the patient's skin and I agree with the wound assessment as performed by the wound care RN as outlined below:    Consultants:  Neurology Vascular surgery  Procedures:  None  Antimicrobials:  Anti-infectives (From admission, onward)    Start     Dose/Rate Route Frequency Ordered Stop   04/27/21 0815  [MAR Hold]  vancomycin (VANCOCIN) IVPB 1000 mg/200 mL premix        (MAR Hold since Wed 04/27/2021 at 0742.Hold Reason: Transfer to a Procedural area)   1,000 mg 200 mL/hr over 60 Minutes Intravenous To [facility] 04/26/21 0657 04/27/21 0850          Subjective: Patient seen and examined.  He has no complaints.  Objective: Vitals:   04/27/21 1130 04/27/21 1155 04/27/21 1200 04/27/21 1210  BP: (!) 119/44 (!) 123/53  (!) 125/51  Pulse: 76 73 66 66  Resp: 13 18 13 19   Temp:    97.7 F (36.5 C)  TempSrc:      SpO2: 100% 97% 100% 97%  Weight:      Height:        Intake/Output Summary (Last 24 hours) at 04/27/2021 1235 Last data filed at 04/27/2021 1113 Gross per 24 hour  Intake 1000 ml  Output 75 ml  Net 925 ml    Filed Weights   04/23/21 2006 04/24/21 2109 04/27/21 0359  Weight: 83.5 kg 80 kg 79.5 kg    Examination:  General exam:  Appears calm and comfortable  Respiratory system: Clear to auscultation. Respiratory effort normal. Cardiovascular system: S1 & S2 heard, RRR. No JVD, murmurs, rubs, gallops or clicks. No pedal edema. Gastrointestinal system: Abdomen is nondistended, soft and nontender. No organomegaly or masses felt. Normal bowel sounds heard. Central nervous system: Alert and oriented. No focal neurological deficits. Extremities: Symmetric 5 x 5 power. Skin: No rashes, lesions or ulcers.  Psychiatry: Judgement and insight appear normal. Mood & affect appropriate.    Data Reviewed: I have personally reviewed following labs and imaging studies  CBC: Recent Labs  Lab 04/20/21 2228 04/23/21 2023 04/24/21 0500 04/26/21 1153  WBC 8.1 8.5 7.7 8.8  NEUTROABS  --  5.3  --   --   HGB 13.4 13.4 12.7* 13.8  HCT 40.5 41.2 39.1 41.1  MCV 89.6 90.4 90.5 88.8  PLT 180 182 166 186    Basic Metabolic Panel: Recent Labs  Lab 04/20/21 2228 04/23/21 2023 04/24/21 0500 04/26/21 1153  NA 138 139 137 136  K 4.5 3.9 3.4* 4.2  CL 102 105 104 101  CO2 30 25 26 28   GLUCOSE 110* 101* 119* 115*  BUN 13 18 17 15   CREATININE 0.95 0.96 0.96 0.92  CALCIUM 9.1 8.6* 8.5* 8.9  MG  --   --  2.0  --     GFR: Estimated Creatinine Clearance: 79.2 mL/min (by C-G formula based on SCr of 0.92 mg/dL). Liver Function Tests: Recent Labs  Lab 04/20/21 2228 04/23/21 2023 04/24/21 0500  AST 15 14* 14*  ALT <5 <5 <5  ALKPHOS 55 52 51  BILITOT 1.0 0.7 0.5  PROT 4.5* 6.5 5.9*  ALBUMIN 4.0 4.1 3.8    No results for input(s): LIPASE, AMYLASE in the last 168 hours. No results for input(s): AMMONIA in the last 168 hours. Coagulation Profile: No results for input(s): INR, PROTIME in the last 168 hours. Cardiac Enzymes: No results for input(s): CKTOTAL, CKMB, CKMBINDEX, TROPONINI in the last 168 hours. BNP (last 3 results) No results for input(s): PROBNP in the last 8760 hours. HbA1C: No results for input(s): HGBA1C in  the last 72 hours.  CBG: Recent Labs  Lab 04/26/21 2024  GLUCAP 110*   Lipid Profile: No results for input(s): CHOL, HDL, LDLCALC, TRIG, CHOLHDL, LDLDIRECT in the last 72 hours.  Thyroid Function Tests: Recent Labs    04/25/21 0559  TSH 1.272    Anemia Panel: No results for input(s): VITAMINB12, FOLATE, FERRITIN, TIBC, IRON, RETICCTPCT in the last 72 hours. Sepsis Labs: No results for input(s): PROCALCITON, LATICACIDVEN in the last 168 hours.  Recent Results (from the past 240 hour(s))  Resp Panel by RT-PCR (Flu A&B, Covid) Nasopharyngeal Swab     Status: None   Collection Time:  04/24/21 12:35 AM   Specimen: Nasopharyngeal Swab; Nasopharyngeal(NP) swabs in vial transport medium  Result Value Ref Range Status   SARS Coronavirus 2 by RT PCR NEGATIVE NEGATIVE Final    Comment: (NOTE) SARS-CoV-2 target nucleic acids are NOT DETECTED.  The SARS-CoV-2 RNA is generally detectable in upper respiratory specimens during the acute phase of infection. The lowest concentration of SARS-CoV-2 viral copies this assay can detect is 138 copies/mL. A negative result does not preclude SARS-Cov-2 infection and should not be used as the sole basis for treatment or other patient management decisions. A negative result may occur with  improper specimen collection/handling, submission of specimen other than nasopharyngeal swab, presence of viral mutation(s) within the areas targeted by this assay, and inadequate number of viral copies(<138 copies/mL). A negative result must be combined with clinical observations, patient history, and epidemiological information. The expected result is Negative.  Fact Sheet for Patients:  BloggerCourse.com  Fact Sheet for Healthcare Providers:  SeriousBroker.it  This test is no t yet approved or cleared by the [place] FDA and  has been authorized for detection and/or diagnosis of SARS-CoV-2 by FDA  under an Emergency Use Authorization (EUA). This EUA will remain  in effect (meaning this test can be used) for the duration of the COVID-19 declaration under Section 564(b)(1) of the Act, 21 U.S.C.section 360bbb-3(b)(1), unless the authorization is terminated  or revoked sooner.       Influenza A by PCR NEGATIVE NEGATIVE Final   Influenza B by PCR NEGATIVE NEGATIVE Final    Comment: (NOTE) The Xpert Xpress SARS-CoV-2/FLU/RSV plus assay is intended as an aid in the diagnosis of influenza from Nasopharyngeal swab specimens and should not be used as a sole basis for treatment. Nasal washings and aspirates are unacceptable for Xpert Xpress SARS-CoV-2/FLU/RSV testing.  Fact Sheet for Patients: BloggerCourse.com  Fact Sheet for Healthcare Providers: SeriousBroker.it  This test is not yet approved or cleared by the [place] FDA and has been authorized for detection and/or diagnosis of SARS-CoV-2 by FDA under an Emergency Use Authorization (EUA). This EUA will remain in effect (meaning this test can be used) for the duration of the COVID-19 declaration under Section 564(b)(1) of the Act, 21 U.S.C. section 360bbb-3(b)(1), unless the authorization is terminated or revoked.  Performed at [facility], [address], [place], [place] [address]        Radiology Studies: No results found.  Scheduled Meds:  [MAR Hold] amLODipine  10 mg Oral Daily   [MAR Hold] aspirin EC  81 mg Oral Daily   [MAR Hold] atorvastatin  20 mg Oral QPM   [MAR Hold] carbidopa-levodopa  1 tablet Oral QHS   [MAR Hold] carbidopa-levodopa  2.5 tablet Oral QID   [MAR Hold] clopidogrel  75 mg Oral Daily   fentaNYL       [MAR Hold] irbesartan  75 mg Oral Daily   [MAR Hold] pantoprazole  40 mg Oral Daily   [MAR Hold] rotigotine  1 patch Transdermal Daily   Continuous Infusions:    LOS: 2 days   Time spent: 25 minutes   Hughie Closs, MD [facility]  04/27/2021, 12:35 PM  Please page via Loretha Stapler and do not message via secure chat for anything urgent. Secure chat can be used for anything non urgent.  How to contact the [facility] Attending or Consulting provider 7A - 7P or covering provider during after hours 7P -7A, for this patient?  Check the care team in [facility] and  look for a) attending/consulting TR[facility] provider listed and b) the [facility] team listed. Page or secure chat 7A-7P. Log into [email or web address] and use [facility] universal password to access. If you do not have the password, please contact the hospital operator. Locate the [facility] provider you are looking for under [facility] and page to a number that you can be directly reached. If you still have difficulty reaching the provider, please page the [facility] (Director on Call) for the Hospitalists listed on amion for assistance.

## 2021-04-27 NOTE — Progress Notes (Signed)
PT Cancellation Note  Patient Details Name: Terry [name] MRN: [id number] DOB: Dec 19, 1945   Cancelled Treatment:    Reason Eval/Treat Not Completed: Patient at procedure or test/unavailable. Pt off the floor at carotid endarterectomy. PT to return as able to progress mobility.  Lewis Shock, PT, DPT Acute Rehabilitation Services Pager #: [phone] Office #: [phone]    Iona Hansen 04/27/2021, 8:06 AM

## 2021-04-27 NOTE — Op Note (Signed)
Patient name: Terry [name] MRN: [id number] DOB: [birth year] Sex: male  04/27/2021 Pre-operative Diagnosis: Symptomatic   left carotid stenosis Post-operative diagnosis:  Same Surgeon:  Durene Cal Assistants:  Clinton Gallant, PA Procedure:    left carotid Endarterectomy with bovine pericardial patch angioplasty Anesthesia:  General Blood Loss:  minimal Specimens:  none  Findings:  70 %stenosis; Thrombus:  none  Indications: This is a 75 year old gentleman who presented with a subacute left brain stroke thought to be secondary to left carotid stenosis.  His symptoms were trouble getting his words out.  This has been improving.  We discussed proceeding with left carotid endarterectomy.  He is not a good stent candidate because of the calcification within his carotid artery.  Procedure:  The patient was identified in the holding area and taken to [facility] OR ROOM 12  The patient was then placed supine on the table.   General endotrachial anesthesia was administered.  The patient was prepped and draped in the usual sterile fashion.  A time out was called and antibiotics were administered.  A PA was necessary to expedite the procedure and assist with technical details.  The incision was made along the anterior border of the left sternocleidomastoid muscle.  Cautery was used to dissect through the subcutaneous tissue.  The platysma muscle was divided with cautery.  The internal jugular vein was exposed along its anterior medial border.  The common facial vein was exposed and then divided between 2-0 silk ties and metal clips.  The common carotid artery was then circumferentially exposed and encircled with an umbilical tape.  The vagus nerve was identified and protected.  Next sharp dissection was used to expose the external carotid artery and the superior thyroid artery.  The were encircled with a blue vessel loop and a 2-0 silk tie respectively.  Finally, the internal carotid was carefully dissected free.   An umbilical tape was placed around the internal carotid artery distal to the diseased segment.  The hypoglossal nerve was visualized throughout and protected.  The patient was given systemic heparinization.  A bovine carotid patch was selected and prepared on the back table.  A 10 french shunt was also prepared.  After blood pressure readings were appropriate and the heparin had been given time to circulate, the internal carotid artery was occluded with a baby Gregory clamp.  The external and common carotid arteries were then occluded with vascular clamps and the 2-0 tie tightened on the superior thyroid artery.  A #11 blade was used to make an arteriotomy in the common carotid artery.  This was extended with Potts scissors along the anterior and lateral border of the common and internal carotid artery.  Approximately 70% stenosis was identified.  There was no thrombus identified.  The 10 french shunt was placed, as there was excellent backbleeding.  A kleiner kuntz elevator was used to perform endarterectomy.  An eversion endarterectomy was performed in the external carotid artery.  A good distal endpoint was obtained in the internal carotid artery.  The specimen was removed and sent to pathology.  Heparinized saline was used to irrigate the endarterectomized field.  All potential embolic debris was removed.  Bovine pericardial patch angioplasty was then performed using a running 6-0 Prolene. The common internal and external carotid arteries were all appropriately flushed. The artery was again irrigated with heparin saline.  The anastomosis was then secured. The clamp was first released on the external carotid artery followed by the common carotid artery approximately 30  seconds later, bloodflow was reestablish through the internal carotid artery.  Next, a hand-held  Doppler was used to evaluate the signals in the common, external, and internal  carotid arteries, all of which had appropriate signals. I then  administered  50 mg protamine. The wound was then irrigated.  After hemostasis was achieved, the carotid sheath was reapproximated with 3-0 Vicryl. The  platysma muscle was reapproximated with running 3-0 Vicryl. The skin  was closed with 4-0 Vicryl. Dermabond was placed on the skin. The  patient was then successfully extubated. His neurologic exam was  similar to his preprocedural exam. The patient was then taken to recovery room  in stable condition. There were no complications.     Disposition:  To PACU in stable condition.  Relevant Operative Details: Heavily calcified circumferential calcification at the carotid bifurcation.  There was sand-like material within the plaque which was the likely source of embolization.  Endarterectomy was performed and bovine patch was placed.  I did not use a shunt as he had pulsatile backbleeding.  He awoke from the surgery neurologically intact.  Juleen China, M.D., [facility] [place] Office: [phone] Pager:  [phone]

## 2021-04-27 NOTE — Transfer of Care (Signed)
Immediate Anesthesia Transfer of Care Note  Patient: Terry [name]  Procedure(s) Performed: LEFT CAROTID ENDARTERECTOMY (Left: Neck) PATCH ANGIOPLASTY USING XENOSURE BIOLOGIC PATCH (Left: Neck)  Patient Location: PACU  Anesthesia Type:General  Level of Consciousness: awake, alert , oriented and patient cooperative  Airway & Oxygen Therapy: Patient Spontanous Breathing and Patient connected to nasal cannula oxygen  Post-op Assessment: Report given to RN, Post -op Vital signs reviewed and stable and Patient moving all extremities  Post vital signs: Reviewed and stable  Last Vitals:  Vitals Value Taken Time  BP 137/59 04/27/21 1110  Temp    Pulse 75 04/27/21 1111  Resp 16 04/27/21 1112  SpO2 99 % 04/27/21 1112  Vitals shown include unvalidated device data.  Last Pain:  Vitals:   04/27/21 0359  TempSrc: Oral  PainSc:          Complications: No notable events documented.

## 2021-04-27 NOTE — Progress Notes (Signed)
Pt wife at bedside post surgery, wife stated she had neupro patch (listed in mar) I advised her that I needed to check if the hospital had it, and if not I would take it and send to our pharmacy to be checked. Wife then applied patch to husband after education to wait.   Kalman Jewels, RN 04/27/2021 4:01 PM

## 2021-04-28 ENCOUNTER — Ambulatory Visit: Payer: Medicare PPO

## 2021-04-28 ENCOUNTER — Encounter (HOSPITAL_COMMUNITY): Payer: Self-pay | Admitting: Surgery

## 2021-04-28 DIAGNOSIS — I63232 Cerebral infarction due to unspecified occlusion or stenosis of left carotid arteries: Secondary | ICD-10-CM | POA: Diagnosis not present

## 2021-04-28 LAB — CBC
HCT: 35.3 % — ABNORMAL LOW (ref 39.0–52.0)
Hemoglobin: 12.2 g/dL — ABNORMAL LOW (ref 13.0–17.0)
MCH: 30.2 pg (ref 26.0–34.0)
MCHC: 34.6 g/dL (ref 30.0–36.0)
MCV: 87.4 fL (ref 80.0–100.0)
Platelets: 171 10*3/uL (ref 150–400)
RBC: 4.04 MIL/uL — ABNORMAL LOW (ref 4.22–5.81)
RDW: 13.3 % (ref 11.5–15.5)
WBC: 13.9 10*3/uL — ABNORMAL HIGH (ref 4.0–10.5)
nRBC: 0 % (ref 0.0–0.2)

## 2021-04-28 LAB — BASIC METABOLIC PANEL
Anion gap: 9 (ref 5–15)
BUN: 21 mg/dL (ref 8–23)
CO2: 22 mmol/L (ref 22–32)
Calcium: 8.5 mg/dL — ABNORMAL LOW (ref 8.9–10.3)
Chloride: 104 mmol/L (ref 98–111)
Creatinine, Ser: 0.86 mg/dL (ref 0.61–1.24)
GFR, Estimated: >60 mL/min (ref >60)
Glucose, Bld: 120 mg/dL — ABNORMAL HIGH (ref 70–99)
Potassium: 4.1 mmol/L (ref 3.5–5.1)
Sodium: 135 mmol/L (ref 135–145)

## 2021-04-28 LAB — LIPID PANEL
Cholesterol: 78 mg/dL (ref 0–200)
HDL: 37 mg/dL — ABNORMAL LOW (ref >40)
LDL Cholesterol: 31 mg/dL (ref 0–99)
Total CHOL/HDL Ratio: 2.1 RATIO
Triglycerides: 52 mg/dL (ref <150)
VLDL: 10 mg/dL (ref 0–40)

## 2021-04-28 MED ORDER — ASPIRIN 81 MG PO TBEC: 81.0000 mg | DELAYED_RELEASE_TABLET | Freq: Every day | ORAL | 1 refills | Status: DC

## 2021-04-28 MED ORDER — CLOPIDOGREL BISULFATE 75 MG PO TABS: 75.0000 mg | ORAL_TABLET | Freq: Every day | ORAL | 0 refills | Status: AC

## 2021-04-28 NOTE — Progress Notes (Signed)
Physical Therapy Treatment Patient Details Name: Terry [name] MRN: [id number] DOB: [birth year] Today's Date: 04/28/2021   History of Present Illness Pt is a 75 y.o. male who presented 04/23/21 with 4 day hx of dizziness and L-sided headache. 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 S/p left carotid endarterectomy with bovine pericardial patch angioplasty 10/5. PMH: Parkinson's disease, carotid artery stenosis, hypertension    PT Comments    Pt continues to display good progress with safe gait without UE support, ambulating without LOB, toe catching, or physical assistance this date. Pt does display some instability, but no LOB, during static and dynamic balance challenges. Performed lower extremity and balance interventions to address his deficits. Will continue to follow acutely. Current recommendations remain appropriate.    Recommendations for follow up therapy are one component of a multi-disciplinary discharge planning process, led by the attending physician.  Recommendations may be updated based on patient status, additional functional criteria and insurance authorization.  Follow Up Recommendations  Outpatient PT     Equipment Recommendations  None recommended by PT    Recommendations for Other Services       Precautions / Restrictions Precautions Precautions: Fall Restrictions Weight Bearing Restrictions: No     Mobility  Bed Mobility Overal bed mobility: Modified Independent             General bed mobility comments: Pt able to transition supine <> sit on bed without assistance with HOB elevated.    Transfers Overall transfer level: Independent Equipment used: None Transfers: Sit to/from Stand Sit to Stand: Independent         General transfer comment: No LOB or  assistance needed to stand.  Ambulation/Gait Ambulation/Gait assistance: Supervision Gait Distance (Feet): 260 Feet Assistive device: None Gait Pattern/deviations: Step-through pattern Gait velocity: reduced Gait velocity interpretation: >2.62 ft/sec, indicative of community ambulatory General Gait Details: Pt with step-through gait pattern, near his normal. Pt with no LOB but some unsteadiness noted when performing zig-zag motions and high marching while ambulating. No LOB with changes in head position, speeds, and turning. Supervision for safety. Did not catch toe this date.   Stairs         General stair comments: Reports he does not need to practice today as he feels he can do it safely.   Wheelchair Mobility    Modified Rankin (Stroke Patients Only) Modified Rankin (Stroke Patients Only) Pre-Morbid Rankin Score: No significant disability Modified Rankin: Slight disability     Balance Overall balance assessment: Needs assistance Sitting-balance support: No upper extremity supported;Feet supported Sitting balance-Leahy Scale: Normal     Standing balance support: No upper extremity supported Standing balance-Leahy Scale: Good Standing balance comment: No UE or LOB noted with ambulating, but slight unsteadiness with balance challenges.                            Cognition Arousal/Alertness: Awake/alert Behavior During Therapy: WFL for tasks assessed/performed Overall Cognitive Status: Within Functional Limits for tasks assessed                                        Exercises General Exercises - Lower Extremity Hip ABduction/ADduction: Strengthening;Both;Standing;5 reps (with UE support at sink) Hip  Flexion/Marching: Strengthening;Both;10 reps;Standing (no UE support) Other Exercises Other Exercises: Bil hip extension strengthening in standing with UE support at sink, 10x Other Exercises: Sit <> stand without UE support, cuing to place  more weight through toes, 10x    General Comments General comments (skin integrity, edema, etc.): pt reported dizziness 1x during exercise, VSS on RA      Pertinent Vitals/Pain Pain Assessment: No/denies pain    Home Living                      Prior Function            PT Goals (current goals can now be found in the care plan section) Acute Rehab PT Goals Patient Stated Goal: to go home today PT Goal Formulation: With patient Time For Goal Achievement: 05/08/21 Potential to Achieve Goals: Good Progress towards PT goals: Progressing toward goals    Frequency    Min 3X/week      PT Plan Current plan remains appropriate    Co-evaluation              AM-PAC PT "6 Clicks" Mobility   Outcome Measure  Help needed turning from your back to your side while in a flat bed without using bedrails?: None Help needed moving from lying on your back to sitting on the side of a flat bed without using bedrails?: None Help needed moving to and from a bed to a chair (including a wheelchair)?: None Help needed standing up from a chair using your arms (e.g., wheelchair or bedside chair)?: None Help needed to walk in hospital room?: A Little Help needed climbing 3-5 steps with a railing? : A Little 6 Click Score: 22    End of Session   Activity Tolerance: Patient tolerated treatment well Patient left: in bed;with call bell/phone within reach;with bed alarm set   PT Visit Diagnosis: Unsteadiness on feet (R26.81);Other abnormalities of gait and mobility (R26.89);Muscle weakness (generalized) (M62.81);History of falling (Z91.81);Difficulty in walking, not elsewhere classified (R26.2);Other symptoms and signs involving the nervous system (R29.898);Dizziness and giddiness (R42)     Time: 9357-0177 PT Time Calculation (min) (ACUTE ONLY): 19 min  Charges:  $Therapeutic Exercise: 8-22 mins                     Raymond Gurney, PT, DPT Acute Rehabilitation Services  Pager:  [phone] Office: [phone]    Jewel Baize 04/28/2021, 8:57 AM

## 2021-04-28 NOTE — Care Management Important Message (Signed)
Important Message  Patient Details  Name: Terry [name] MRN: [id number] Date of Birth: Apr 20, 1946   Medicare Important Message Given:  Yes     Renie Ora 04/28/2021, 11:50 AM

## 2021-04-28 NOTE — Discharge Instructions (Signed)
   [facility]  Discharge Instructions   Carotid Surgery  Please refer to the following instructions for your post-procedure care. Your surgeon or physician assistant will discuss any changes with you.  Activity  You are encouraged to walk as much as you can. You can slowly return to normal activities but must avoid strenuous activity and heavy lifting until your doctor tell you it's okay. Avoid activities such as vacuuming or swinging a golf club. You can drive after one week if you are comfortable and you are no longer taking prescription pain medications. It is normal to feel tired for serval weeks after your surgery. It is also normal to have difficulty with sleep habits, eating, and bowel movements after surgery. These will go away with time.  Bathing/Showering  Shower daily after you go home. Do not soak in a bathtub, hot tub, or swim until the incision heals completely.  Incision Care  Shower every day. Clean your incision with mild soap and water. Pat the area dry with a clean towel. You do not need a bandage unless otherwise instructed. Do not apply any ointments or creams to your incision. You may have skin glue on your incision. Do not peel it off. It will come off on its own in about one week. Your incision may feel thickened and raised for several weeks after your surgery. This is normal and the skin will soften over time.   For Men Only: It's okay to shave around the incision but do not shave the incision itself for 2 weeks. It is common to have numbness under your chin that could last for several months.  Diet  Resume your normal diet. There are no special food restrictions following this procedure. A low fat/low cholesterol diet is recommended for all patients with vascular disease. In order to heal from your surgery, it is CRITICAL to get adequate nutrition. Your body requires vitamins, minerals, and protein. Vegetables are the best source of  vitamins and minerals. Vegetables also provide the perfect balance of protein. Processed food has little nutritional value, so try to avoid this.  Medications  Resume taking all of your medications unless your doctor or physician assistant tells you not to. If your incision is causing pain, you may take over-the- counter pain relievers such as acetaminophen (Tylenol). If you were prescribed a stronger pain medication, please be aware these medications can cause nausea and constipation. Prevent nausea by taking the medication with a snack or meal. Avoid constipation by drinking plenty of fluids and eating foods with a high amount of fiber, such as fruits, vegetables, and grains.   Do not take Tylenol if you are taking prescription pain medications.  Follow Up  Our office will schedule a follow up appointment 2-3 weeks following discharge.  Please call us immediately for any of the following conditions  . Increased pain, redness, drainage (pus) from your incision site. . Fever of 101 degrees or higher. . If you should develop stroke (slurred speech, difficulty swallowing, weakness on one side of your body, loss of vision) you should call 911 and go to the nearest emergency room. .  Reduce your risk of vascular disease:  . Stop smoking. If you would like help call [facility] at 1-800-QUIT-NOW (1-800-784-8669) or [place] at 336-586-4000. . Manage your cholesterol . Maintain a desired weight . Control your diabetes . Keep your blood pressure down .  If you have any questions, please call the office at [phone]. 

## 2021-04-28 NOTE — Progress Notes (Addendum)
  Progress Note    04/28/2021 7:24 AM 1 Day Post-Op  Subjective:  no complaints. No pain. Has a little sore throat and hoarseness this morning. Has not yet ambulated. Tolerating diet   Vitals:   04/28/21 0110 04/28/21 0300  BP: (!) 127/57 (!) 122/55  Pulse: 63 62  Resp: 14 20  Temp: 98.8 F (37.1 C) (!) 97.5 F (36.4 C)  SpO2: 94% 94%   Physical Exam: Cardiac:  regular Lungs:  non labored Incisions:  left neck incision is clean, dry and intact. No swelling or hematoma Extremities:  moving all extremities without deficits Neurologic: alert and oriented. CN intact. Smile symmetric. Tongue midline. Speech coherent. Sensation is intact and equal bilaterally  CBC    Component Value Date/Time   WBC 13.9 (H) 04/28/2021 0445   RBC 4.04 (L) 04/28/2021 0445   HGB 12.2 (L) 04/28/2021 0445   HGB 15.9 11/18/2019 1216   HCT 35.3 (L) 04/28/2021 0445   HCT 47.8 11/18/2019 1216   PLT 171 04/28/2021 0445   PLT 233 11/18/2019 1216   MCV 87.4 04/28/2021 0445   MCV 88 11/18/2019 1216   MCH 30.2 04/28/2021 0445   MCHC 34.6 04/28/2021 0445   RDW 13.3 04/28/2021 0445   RDW 13.1 11/18/2019 1216   LYMPHSABS 2.4 04/23/2021 2023   MONOABS 0.7 04/23/2021 2023   EOSABS 0.1 04/23/2021 2023   BASOSABS 0.0 04/23/2021 2023    BMET    Component Value Date/Time   NA 135 04/28/2021 0445   NA 143 11/18/2019 1216   K 4.1 04/28/2021 0445   CL 104 04/28/2021 0445   CO2 22 04/28/2021 0445   GLUCOSE 120 (H) 04/28/2021 0445   BUN 21 04/28/2021 0445   BUN 23 11/18/2019 1216   CREATININE 0.86 04/28/2021 0445   CALCIUM 8.5 (L) 04/28/2021 0445   GFRNONAA >60 04/28/2021 0445   GFRAA 85 11/18/2019 1216    INR No results found for: INR   Intake/Output Summary (Last 24 hours) at 04/28/2021 0724 Last data filed at 04/28/2021 0600 Gross per 24 hour  Intake 2515.26 ml  Output 775 ml  Net 1740.26 ml     Assessment/Plan:  75 y.o. male is s/p left CEA 1 Day Post-Op   Doing well post  op Neurologically intact Hemodynamically stable Left neck incision is c/d/I without swelling or hematoma He is stable for discharge home today from vascular standpoint Patient has pain medication at home so no new script will be given at d/c  Continue Aspirin and Statin Recommend having him ambulate prior to discharge He will have follow up in 2 weeks in our office   Graceann Congress, PA-C [facility] [phone] 04/28/2021 7:24 AM

## 2021-04-28 NOTE — Progress Notes (Signed)
Patient given discharge instructions. PIVs removed. Telemetry box removed, CCMD notified. Patients personal belongings packed up. Patient taken to vehicle in wheelchair by staff.  Kenard Gower, RN

## 2021-04-28 NOTE — Discharge Summary (Signed)
Physician Discharge Summary  Author Slaven XBD:[id number] DOB: 25-Mar-1946 DOA: 04/23/2021  PCP: Georgianne Fick, MD  Admit date: 04/23/2021 Discharge date: 04/28/2021 30 Day Unplanned Readmission Risk Score    Flowsheet Row ED to Hosp-Admission (Current) from 04/23/2021 in [facility] 4E CV SURGICAL PROGRESSIVE CARE  30 Day Unplanned Readmission Risk Score (%) 12.41 Filed at 04/28/2021 0801       This score is the patient's risk of an unplanned readmission within 30 days of being discharged (0 -100%). The score is based on dignosis, age, lab data, medications, orders, and past utilization.   Low:  0-14.9   Medium: 15-21.9   High: 22-29.9   Extreme: 30 and above          Admitted From: Home Disposition: Home  Recommendations for Outpatient Follow-up:  Follow up with PCP in 1-2 weeks Please obtain BMP/CBC in one week Follow up with vascular surgery in 2 weeks Follow-up with neurology in 4 to 6 weeks Please follow up with your PCP on the following pending results: Unresulted Labs (From admission, onward)     Start     Ordered   04/24/21 0009  Rapid urine drug screen (hospital performed)  Add-on,   AD        04/24/21 0008              Home Health: None Equipment/Devices: None  Discharge Condition: Stable CODE STATUS: Full code Diet recommendation: Cardiac  Subjective: Seen and examined.  Doing well.  No complaints.  Ready to go home.  Brief/Interim Summary: Terry [name] is a 75 y.o. male with medical history significant for Parkinson's disease, carotid artery stenosis, hypertension, who is admitted to [facility] on 04/23/2021 with subacute ischemic CVA after presenting from home to [facility] ED complaining of dizziness and evidence of significantly elevated blood pressure since last 4 days.   Upon arrival to ED, his other vitals were stable except his blood pressure which was 241/94, COVID-19/influenza PCR were negative. Noncontrast CT of the head showed no evidence of  acute intracranial process, including no evidence of intracranial hemorrhage.  Neurology was consulted, MRI confirmed 8 mm subcortical/deep white matter anterior left frontal lobe infarct.  MR angiogram of neck shows high-grade left ICA stenosis of 80%.  Patient was not on any antiplatelet prior to admission.  He was started on aspirin 81 mg.  Vascular surgery consulted.  CT angiogram of neck completed.  He underwent carotid endarterectomy on 04/27/2021.  Was then started on Plavix.  Blood pressure remained controlled.  He has been cleared from both neurology and vascular surgery for discharge with instruction to continue DAPT for 3 weeks and then stop Plavix but continue aspirin.  Patient was assessed by PT OT and no need for further PT OT was observed.   Hypokalemia: Resolved   Hyperlipidemia: Continue atorvastatin.   Parkinson's disease: Continue home medications.   GERD: Continue PPI  Discharge Diagnoses:  Principal Problem:   Stroke [facility]) Active Problems:   Essential hypertension   Parkinson's disease (HCC)   GERD (gastroesophageal reflux disease)   Hypertensive crisis   Headache    Discharge Instructions   Allergies as of 04/28/2021       Reactions   Penicillins Rash   Erythromycin Rash   Calm Nausea And Vomiting        Medication List     STOP taking these medications    Tadalafil 2.5 MG Tabs Commonly known as: Cialis       TAKE these medications  acetaminophen 500 MG tablet Commonly known as: TYLENOL Take 500 mg by mouth every 6 (six) hours as needed for moderate pain or headache.   amLODipine 10 MG tablet Commonly known as: NORVASC Take 1 tablet (10 mg total) by mouth daily.   aspirin 81 MG EC tablet Take 1 tablet (81 mg total) by mouth daily. Swallow whole. Start taking on: April 29, 2021   atorvastatin 20 MG tablet Commonly known as: LIPITOR TAKE ONE TABLET BY MOUTH EVERY NIGHT AT BEDTIME What changed: when to take this   betamethasone  acetate-betamethasone sodium phosphate 6 (3-3) MG/ML injection Commonly known as: CELESTONE Every 6 months   carbidopa-levodopa 25-100 MG tablet Commonly known as: SINEMET IR Take 2.5 tablets by mouth 4 (four) times daily. 7am, 12pm, 5pm, 10pm   carbidopa-levodopa 50-200 MG tablet Commonly known as: SINEMET CR Take 1 tablet by mouth at bedtime.   cetirizine 10 MG tablet Commonly known as: ZYRTEC Take 10 mg by mouth as needed for allergies.   clopidogrel 75 MG tablet Commonly known as: PLAVIX Take 1 tablet (75 mg total) by mouth daily for 20 days. Start taking on: April 29, 2021   fluticasone 50 MCG/ACT nasal spray Commonly known as: FLONASE Place 1 spray into both nostrils daily as needed for allergies.   meloxicam 15 MG tablet Commonly known as: MOBIC Take 15 mg by mouth at bedtime as needed for pain.   Neupro 4 MG/24HR Generic drug: rotigotine Place 1 patch onto the skin daily.   nitroGLYCERIN 0.4 MG SL tablet Commonly known as: NITROSTAT Place 1 tablet (0.4 mg total) under the tongue every 5 (five) minutes as needed for chest pain.   omeprazole 40 MG capsule Commonly known as: PRILOSEC Take 1 capsule (40 mg total) by mouth daily.   tiZANidine 2 MG tablet Commonly known as: ZANAFLEX Take 2 mg by mouth daily as needed for muscle spasms.   traZODone 50 MG tablet Commonly known as: DESYREL Take 1 tablet (50 mg total) by mouth at bedtime as needed for sleep.   valsartan 80 MG tablet Commonly known as: DIOVAN Take 80 mg by mouth daily. What changed: Another medication with the same name was removed. Continue taking this medication, and follow the directions you see here.        Follow-up Information     Jaquita Folds, MD. Go on 05/23/2021.   Specialty: Neurology Contact information: [facility] [place] [address] [phone]         [facility] Follow up in 2 week(s).   Why: The office will call the patient with  an appointment Contact information: [address] [place] [address] [phone]        Georgianne Fick, MD Follow up in 1 week(s).   Specialty: Internal Medicine Contact information: [address] [place] [address] [place] [address] [phone]         Sande Rives, MD .   Specialties: Cardiology, Internal Medicine, Radiology Contact information: [address] [place] [address] [phone]                Allergies  Allergen Reactions   Penicillins Rash   Erythromycin Rash   Calm Nausea And Vomiting    Consultations: Neurology and vascular surgery   Procedures/Studies: DG Chest 2 View  Result Date: 04/20/2021 CLINICAL DATA:  Chest pain, hypertension EXAM: CHEST - 2 VIEW COMPARISON:  None. FINDINGS: Lungs are clear.  No pleural effusion or pneumothorax. The heart is normal  in size. Visualized osseous structures are within normal limits. IMPRESSION: Normal chest radiographs. Electronically Signed   By: Charline Bills M.D.   On: 04/20/2021 22:36   CT HEAD WO CONTRAST ( )  Result Date: 04/23/2021 CLINICAL DATA:  Neuro deficit EXAM: CT HEAD WITHOUT CONTRAST TECHNIQUE: Contiguous axial images were obtained from the base of the skull through the vertex without intravenous contrast. COMPARISON:  04/20/2021 FINDINGS: Brain: No evidence of acute infarction, hemorrhage, hydrocephalus, extra-axial collection or mass lesion/mass effect. Mild atrophic changes are noted. Vascular: No hyperdense vessel or unexpected calcification. Skull: Normal. Negative for fracture or focal lesion. Sinuses/Orbits: No acute finding. Other: None. IMPRESSION: No acute intracranial abnormality noted. Electronically Signed   By: Alcide Clever M.D.   On: 04/23/2021 21:05   CT Head Wo Contrast  Result Date: 04/20/2021 CLINICAL DATA:  Headache, hypertension EXAM: CT HEAD WITHOUT CONTRAST TECHNIQUE: Contiguous axial images were obtained from the base of the  skull through the vertex without intravenous contrast. COMPARISON:  None. FINDINGS: Brain: Normal anatomic configuration. Parenchymal volume loss is commensurate with the patient's age. Mild periventricular white matter changes are present likely reflecting the sequela of small vessel ischemia. No abnormal intra or extra-axial mass lesion or fluid collection. No abnormal mass effect or midline shift. No evidence of acute intracranial hemorrhage or infarct. Ventricular size is normal. Cerebellum unremarkable. Vascular: No asymmetric hyperdense vasculature at the skull base. Skull: Intact Sinuses/Orbits: Middle turbinectomy has been performed. The visualized paranasal sinuses are clear. Orbits are unremarkable. Other: Mastoid air cells and middle ear cavities are clear. Remote nasal fracture noted. IMPRESSION: No acute intracranial abnormality.  Mild senescent change. Electronically Signed   By: Helyn Numbers M.D.   On: 04/20/2021 22:59   CT ANGIO NECK W OR WO CONTRAST  Result Date: 04/24/2021 CLINICAL DATA:  Stroke/TIA, assess extracranial arteries. EXAM: CT ANGIOGRAPHY NECK TECHNIQUE: Multidetector CT imaging of the neck was performed using the standard protocol during bolus administration of intravenous contrast. Multiplanar CT image reconstructions and MIPs were obtained to evaluate the vascular anatomy. Carotid stenosis measurements (when applicable) are obtained utilizing NASCET criteria, using the distal internal carotid diameter as the denominator. CONTRAST:  57mL OMNIPAQUE IOHEXOL 350 MG/ML SOLN COMPARISON:  Neck MRA 04/24/2021 FINDINGS: Aortic arch: Standard 3 vessel aortic arch with mild atherosclerotic plaque. Widely patent brachiocephalic and subclavian arteries. Right carotid system: Patent with extensive calcified plaque at the carotid bifurcation and in the carotid bulb resulting in 50% proximal ICA stenosis. Left carotid system: Patent with extensive calcified plaque at the carotid bifurcation  resulting in 65% proximal ICA stenosis. Vertebral arteries: Patent with calcified plaque at the right greater than left vertebral artery origins. No evidence of a significant stenosis or dissection. Codominant. Skeleton: No acute osseous abnormality or suspicious osseous lesion. Moderate lower cervical disc degeneration. Advanced left facet arthrosis at C3-4. Other neck: No evidence of cervical lymphadenopathy or mass. Upper chest: Clear lung apices. IMPRESSION: 1. 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 2. 50% proximal right ICA stenosis. 3. Widely patent vertebral arteries. 4. Aortic Atherosclerosis (ICD10-I70.0). Electronically Signed   By: Sebastian Ache M.D.   On: 04/24/2021 16:44   MR ANGIO HEAD WO CONTRAST  Result Date: 04/24/2021 CLINICAL DATA:  Follow-up examination for acute stroke. EXAM: MRA NECK WITHOUT AND WITH CONTRAST MRA HEAD WITHOUT CONTRAST TECHNIQUE: Multiplanar and multiecho pulse sequences of the neck were obtained without and with intravenous contrast. Angiographic images of the neck were obtained using MRA technique without and with intravenous contrast; Angiographic images  of the Circle of Willis were obtained using MRA technique without intravenous contrast. CONTRAST:  10mL GADAVIST GADOBUTROL 1 MMOL/ML IV SOLN COMPARISON:  Prior MRI from 04/23/2021. FINDINGS: MRA NECK FINDINGS AORTIC ARCH: Visualized aortic arch normal in caliber with normal 3 vessel morphology. No hemodynamically significant stenosis or other abnormality seen about the origin of the great vessels. RIGHT CAROTID SYSTEM: Right CCA patent from its origin to the bifurcation without stenosis. Atheromatous irregularity seen about the right carotid bulb/proximal right ICA. Associated stenosis of up to 40% by NASCET criteria. Right ICA otherwise patent distally to the skull base without stenosis, evidence for dissection or occlusion. LEFT CAROTID SYSTEM: Left CCA patent from its origin to the bifurcation without stenosis. Prominent  atheromatous change about the left carotid bulb/proximal left ICA. Associated severe short-segment stenosis of up to 80% by NASCET criteria (series 303, image 5). Left ICA otherwise patent distally to the skull base without stenosis, evidence for dissection, or occlusion. VERTEBRAL ARTERIES: Both vertebral arteries arise from the subclavian arteries. No proximal subclavian artery stenosis. Vertebral arteries patent within the neck without stenosis, evidence for dissection or occlusion. Other: None. MRA HEAD FINDINGS ANTERIOR CIRCULATION: Both internal carotid arteries widely patent to the termini without stenosis. A1 segments widely patent. Normal anterior communicating artery complex. Both anterior cerebral arteries widely patent to their distal aspects without stenosis. No M1 stenosis or occlusion. Normal MCA bifurcations. Distal MCA branches well perfused and symmetric. POSTERIOR CIRCULATION: Both V4 segments patent to the vertebrobasilar junction without stenosis. Right vertebral artery slightly dominant. Both PICA origins patent and normal. Basilar widely patent to its distal aspect without stenosis. Tiny 2 mm outpouching at the origin of the right AICA most consistent with a small vascular infundibulum. Superior cerebellar arteries patent bilaterally. Both PCAs primarily supplied via the basilar and are well perfused to there distal aspects. Small right posterior communicating artery noted. Anatomic variant: None significant. Other: None.  No intracranial aneurysm. IMPRESSION: MRA HEAD IMPRESSION: Normal intracranial MRA. No large vessel occlusion, hemodynamically significant stenosis, or other acute vascular abnormality. MRA NECK IMPRESSION: 1. Atheromatous change about the left carotid bulb/proximal left ICA with associated short-segment severe stenosis of up to 80% by NASCET criteria. 2. Atheromatous change about the right carotid bulb/proximal right ICA with associated stenosis of up to 40% by NASCET  criteria. 3. Wide patency of both vertebral arteries within the neck. Electronically Signed   By: Rise Mu M.D.   On: 04/24/2021 05:54   MR ANGIO NECK W WO CONTRAST  Result Date: 04/24/2021 CLINICAL DATA:  Follow-up examination for acute stroke. EXAM: MRA NECK WITHOUT AND WITH CONTRAST MRA HEAD WITHOUT CONTRAST TECHNIQUE: Multiplanar and multiecho pulse sequences of the neck were obtained without and with intravenous contrast. Angiographic images of the neck were obtained using MRA technique without and with intravenous contrast; Angiographic images of the Circle of Willis were obtained using MRA technique without intravenous contrast. CONTRAST:  21mL GADAVIST GADOBUTROL 1 MMOL/ML IV SOLN COMPARISON:  Prior MRI from 04/23/2021. FINDINGS: MRA NECK FINDINGS AORTIC ARCH: Visualized aortic arch normal in caliber with normal 3 vessel morphology. No hemodynamically significant stenosis or other abnormality seen about the origin of the great vessels. RIGHT CAROTID SYSTEM: Right CCA patent from its origin to the bifurcation without stenosis. Atheromatous irregularity seen about the right carotid bulb/proximal right ICA. Associated stenosis of up to 40% by NASCET criteria. Right ICA otherwise patent distally to the skull base without stenosis, evidence for dissection or occlusion. LEFT CAROTID SYSTEM: Left CCA  patent from its origin to the bifurcation without stenosis. Prominent atheromatous change about the left carotid bulb/proximal left ICA. Associated severe short-segment stenosis of up to 80% by NASCET criteria (series 303, image 5). Left ICA otherwise patent distally to the skull base without stenosis, evidence for dissection, or occlusion. VERTEBRAL ARTERIES: Both vertebral arteries arise from the subclavian arteries. No proximal subclavian artery stenosis. Vertebral arteries patent within the neck without stenosis, evidence for dissection or occlusion. Other: None. MRA HEAD FINDINGS ANTERIOR  CIRCULATION: Both internal carotid arteries widely patent to the termini without stenosis. A1 segments widely patent. Normal anterior communicating artery complex. Both anterior cerebral arteries widely patent to their distal aspects without stenosis. No M1 stenosis or occlusion. Normal MCA bifurcations. Distal MCA branches well perfused and symmetric. POSTERIOR CIRCULATION: Both V4 segments patent to the vertebrobasilar junction without stenosis. Right vertebral artery slightly dominant. Both PICA origins patent and normal. Basilar widely patent to its distal aspect without stenosis. Tiny 2 mm outpouching at the origin of the right AICA most consistent with a small vascular infundibulum. Superior cerebellar arteries patent bilaterally. Both PCAs primarily supplied via the basilar and are well perfused to there distal aspects. Small right posterior communicating artery noted. Anatomic variant: None significant. Other: None.  No intracranial aneurysm. IMPRESSION: MRA HEAD IMPRESSION: Normal intracranial MRA. No large vessel occlusion, hemodynamically significant stenosis, or other acute vascular abnormality. MRA NECK IMPRESSION: 1. Atheromatous change about the left carotid bulb/proximal left ICA with associated short-segment severe stenosis of up to 80% by NASCET criteria. 2. Atheromatous change about the right carotid bulb/proximal right ICA with associated stenosis of up to 40% by NASCET criteria. 3. Wide patency of both vertebral arteries within the neck. Electronically Signed   By: Rise Mu M.D.   On: 04/24/2021 05:54   MR BRAIN WO CONTRAST  Result Date: 04/24/2021 CLINICAL DATA:  Initial evaluation for acute dizziness. EXAM: MRI HEAD WITHOUT CONTRAST TECHNIQUE: Multiplanar, multiecho pulse sequences of the brain and surrounding structures were obtained without intravenous contrast. COMPARISON:  Head CT from earlier the same day. FINDINGS: Brain: Cerebral volume within normal limits for age. Few  scattered patchy subcentimeter T2/FLAIR hyperintensity noted about the periventricular and deep white matter both cerebral hemispheres, nonspecific, but most commonly related to chronic microvascular ischemic disease. Overall, changes are mild for age. Probable small remote lacunar infarct noted within the ventral right thalamus. Additional tiny remote lacunar infarct present at the right basal ganglia. 8 mm focus of mild diffusion signal seen involving the subcortical/deep white matter of the anterior left frontal lobe (series 5, image 85). Associated prominent T2/FLAIR signal abnormality (series 11, image 20). No associated ADC correlate. Finding is nonspecific, but favored to reflect changes of evolving late subacute small vessel ischemia. No associated hemorrhage or mass effect. No other diffusion abnormality to suggest acute or subacute ischemia elsewhere within the brain. Gray-white matter differentiation otherwise maintained. No encephalomalacia to suggest chronic cortical infarction. No evidence for acute or chronic intracranial hemorrhage. No mass lesion, midline shift or mass effect. No hydrocephalus or extra-axial fluid collection. Pituitary gland suprasellar region within normal limits. Midline structures intact and normal. Vascular: Major intracranial vascular flow voids are maintained. Skull and upper cervical spine: Craniocervical junction within normal limits. Bone marrow signal intensity normal. No scalp soft tissue abnormality. Sinuses/Orbits: Patient status post bilateral ocular lens replacement. Globes and orbital soft tissues demonstrate no acute finding. Paranasal sinuses are clear. Small left mastoid effusion noted. Inner ear structures grossly normal. Negative nasopharynx. Other: None.  IMPRESSION: 1. 8 mm focus of mild diffusion signal abnormality involving the subcortical/deep white matter of the anterior left frontal lobe, nonspecific, but favored to reflect changes of evolving late  subacute small vessel ischemia. No associated hemorrhage or mass effect. 2. No other acute intracranial abnormality. 3. Mild for age chronic microvascular ischemic disease with small remote lacunar infarcts involving the right thalamus and right basal ganglia. Electronically Signed   By: Rise Mu M.D.   On: 04/24/2021 01:28   ECHOCARDIOGRAM COMPLETE  Result Date: 04/24/2021    ECHOCARDIOGRAM REPORT   Patient Name:   Terry [name] Date of Exam: 04/24/2021 Medical Rec #:  [id number]     Height:       73.0 in Accession #:    [id number]    Weight:       184.0 lb Date of Birth:  [birth year]     BSA:          2.077 m Patient Age:    74 years      BP:           164/61 mmHg Patient Gender: M             HR:           69 bpm. Exam Location:  Inpatient Procedure: 2D Echo, Cardiac Doppler and Color Doppler Indications:    Stroke I63.9  History:        Patient has prior history of Echocardiogram examinations, most                 recent 10/31/2019. Carotid Disease; Risk Factors:Dyslipidemia and                 Hypertension. Parkinsons.  Sonographer:    Leta Jungling RDCS Referring Phys: [id number] Angie Fava IMPRESSIONS  1. Left ventricular ejection fraction, by estimation, is 65 to 70%. The left ventricle has normal function. The left ventricle has no regional wall motion abnormalities. There is moderate left ventricular hypertrophy. Left ventricular diastolic parameters are indeterminate.  2. Right ventricular systolic function is normal. The right ventricular size is normal.  3. Left atrial size was mildly dilated.  4. Right atrial size was mildly dilated.  5. The mitral valve is grossly normal. No evidence of mitral valve regurgitation.  6. The aortic valve is tricuspid. Aortic valve regurgitation is not visualized. Comparison(s): Prior images reviewed side by side. No significant change in LVEF. FINDINGS  Left Ventricle: Left ventricular ejection fraction, by estimation, is 65 to 70%. The left ventricle has  normal function. The left ventricle has no regional wall motion abnormalities. The left ventricular internal cavity size was normal in size. There is  moderate left ventricular hypertrophy. Left ventricular diastolic parameters are indeterminate. Right Ventricle: The right ventricular size is normal. No increase in right ventricular wall thickness. Right ventricular systolic function is normal. Left Atrium: Left atrial size was mildly dilated. Right Atrium: Right atrial size was mildly dilated. Pericardium: There is no evidence of pericardial effusion. Mitral Valve: The mitral valve is grossly normal. No evidence of mitral valve regurgitation. Tricuspid Valve: The tricuspid valve is grossly normal. Tricuspid valve regurgitation is mild. Aortic Valve: The aortic valve is tricuspid. There is mild to moderate aortic valve annular calcification. Aortic valve regurgitation is not visualized. Pulmonic Valve: The pulmonic valve was grossly normal. Pulmonic valve regurgitation is trivial. Aorta: The aortic root is normal in size and structure. IAS/Shunts: The interatrial septum appears to be lipomatous. No atrial level shunt detected by  color flow Doppler.  LEFT VENTRICLE PLAX 2D LVIDd:         4.60 cm  Diastology LV PW:         1.10 cm  LV e' medial:    6.00 cm/s LV IVS:        1.20 cm  LV E/e' medial:  11.4 LVOT diam:     2.00 cm  LV e' lateral:   6.08 cm/s LV SV:         62       LV E/e' lateral: 11.2 LV SV Index:   30 LVOT Area:     3.14 cm  RIGHT VENTRICLE RV S prime:     15.30 cm/s TAPSE (M-mode): 3.7 cm LEFT ATRIUM             Index       RIGHT ATRIUM           Index LA diam:        2.80 cm 1.35 cm/m  RA Area:     22.80 cm LA Vol (A2C):   58.5 ml 28.17 ml/m RA Volume:   71.10 ml  34.24 ml/m LA Vol (A4C):   75.2 ml 36.21 ml/m LA Biplane Vol: 67.9 ml 32.70 ml/m  AORTIC VALVE LVOT Vmax:   105.00 cm/s LVOT Vmean:  68.500 cm/s LVOT VTI:    0.197 m  AORTA Ao Root diam: 3.00 cm Ao Asc diam:  3.00 cm MITRAL VALVE MV  Area (PHT): 3.16 cm    SHUNTS MV Decel Time: 240 msec    Systemic VTI:  0.20 m MV E velocity: 68.40 cm/s  Systemic Diam: 2.00 cm MV A velocity: 82.30 cm/s MV E/A ratio:  0.83 Nona Dell MD Electronically signed by Nona Dell MD Signature Date/Time: 04/24/2021/3:25:55 PM    Final      Discharge Exam: Vitals:   04/28/21 0300 04/28/21 0724  BP: (!) 122/55 126/60  Pulse: 62 69  Resp: 20 18  Temp: (!) 97.5 F (36.4 C) 97.7 F (36.5 C)  SpO2: 94% 95%   Vitals:   04/28/21 0100 04/28/21 0110 04/28/21 0300 04/28/21 0724  BP: (!) 127/57 (!) 127/57 (!) 122/55 126/60  Pulse: 63 63 62 69  Resp: Temp:  98.8 F (37.1 C) (!) 97.5 F (36.4 C) 97.7 F (36.5 C)  TempSrc:   Oral Oral  SpO2: 94% 94% 94% 95%  Weight:   83.9 kg   Height:    (1.854 m)     General: Pt is alert, awake, not in acute distress Cardiovascular: RRR, S1/S2 +, no rubs, no gallops Respiratory: CTA bilaterally, no wheezing, no rhonchi Abdominal: Soft, NT, ND, bowel sounds + Extremities: no edema, no cyanosis    The results of significant diagnostics from this hospitalization (including imaging, microbiology, ancillary and laboratory) are listed below for reference.     Microbiology: Recent Results (from the past 240 hour(s))  Resp Panel by RT-PCR (Flu A&B, Covid) Nasopharyngeal Swab     Status: None   Collection Time: 04/24/21 12:35 AM   Specimen: Nasopharyngeal Swab; Nasopharyngeal(NP) swabs in vial transport medium  Result Value Ref Range Status   SARS Coronavirus 2 by RT PCR NEGATIVE NEGATIVE Final    Comment: (NOTE) SARS-CoV-2 target nucleic acids are NOT DETECTED.  The SARS-CoV-2 RNA is generally detectable in upper respiratory specimens during the acute phase of infection. The lowest concentration of SARS-CoV-2 viral copies this assay can detect is 138 copies/mL. A  negative result does not preclude SARS-Cov-2 infection and should not be used as the sole basis for treatment  or other patient management decisions. A negative result may occur with  improper specimen collection/handling, submission of specimen other than nasopharyngeal swab, presence of viral mutation(s) within the areas targeted by this assay, and inadequate number of viral copies(<138 copies/mL). A negative result must be combined with clinical observations, patient history, and epidemiological information. The expected result is Negative.  Fact Sheet for Patients:  BloggerCourse.com  Fact Sheet for Healthcare Providers:  SeriousBroker.it  This test is no t yet approved or cleared by the [place] FDA and  has been authorized for detection and/or diagnosis of SARS-CoV-2 by FDA under an Emergency Use Authorization (EUA). This EUA will remain  in effect (meaning this test can be used) for the duration of the COVID-19 declaration under Section 564(b)(1) of the Act, 21 U.S.C.section 360bbb-3(b)(1), unless the authorization is terminated  or revoked sooner.       Influenza A by PCR NEGATIVE NEGATIVE Final   Influenza B by PCR NEGATIVE NEGATIVE Final    Comment: (NOTE) The Xpert Xpress SARS-CoV-2/FLU/RSV plus assay is intended as an aid in the diagnosis of influenza from Nasopharyngeal swab specimens and should not be used as a sole basis for treatment. Nasal washings and aspirates are unacceptable for Xpert Xpress SARS-CoV-2/FLU/RSV testing.  Fact Sheet for Patients: BloggerCourse.com  Fact Sheet for Healthcare Providers: SeriousBroker.it  This test is not yet approved or cleared by the [place] FDA and has been authorized for detection and/or diagnosis of SARS-CoV-2 by FDA under an Emergency Use Authorization (EUA). This EUA will remain in effect (meaning this test can be used) for the duration of the COVID-19 declaration under Section 564(b)(1) of the Act, 21 U.S.C. section  360bbb-3(b)(1), unless the authorization is terminated or revoked.  Performed at [facility], [address] [place], [place] [address]      Labs: BNP (last 3 results) No results for input(s): BNP in the last 8760 hours. Basic Metabolic Panel: Recent Labs  Lab 04/23/21 2023 04/24/21 0500 04/26/21 1153 04/27/21 1434 04/28/21 0445  NA 139 137 136  --  135  K 3.9 3.4* 4.2  --  4.1  CL 105 104 101  --  104  CO2 --  22  GLUCOSE 101* 119* 115*  --  120*  BUN --  21  CREATININE 0.96 0.96 0.92 0.97 0.86  CALCIUM 8.6* 8.5* 8.9  --  8.5*  MG  --  2.0  --   --   --    Liver Function Tests: Recent Labs  Lab 04/23/21 2023 04/24/21 0500  AST 14* 14*  ALT <5 <5  ALKPHOS 52 51  BILITOT 0.7 0.5  PROT 6.5 5.9*  ALBUMIN 4.1 3.8   No results for input(s): LIPASE, AMYLASE in the last 168 hours. No results for input(s): AMMONIA in the last 168 hours. CBC: Recent Labs  Lab 04/23/21 2023 04/24/21 0500 04/26/21 1153 04/27/21 1434 04/28/21 0445  WBC 8.5 7.7 8.8 14.1* 13.9*  NEUTROABS 5.3  --   --   --   --   HGB 13.4 12.7* 13.8 13.3 12.2*  HCT 41.2 39.1 41.1 39.8 35.3*  MCV 90.4 90.5 88.8 88.8 87.4  PLT 182 166 186 180 171   Cardiac Enzymes: No results for input(s): CKTOTAL, CKMB, CKMBINDEX, TROPONINI in the last 168 hours. BNP: Invalid input(s): POCBNP CBG: Recent  Labs  Lab 04/26/21 2024  GLUCAP 110*   D-Dimer No results for input(s): DDIMER in the last 72 hours. Hgb A1c No results for input(s): HGBA1C in the last 72 hours. Lipid Profile Recent Labs    04/28/21 0445  CHOL 78  HDL 37*  LDLCALC 31  TRIG 52  CHOLHDL 2.1   Thyroid function studies No results for input(s): TSH, T4TOTAL, T3FREE, THYROIDAB in the last 72 hours.  Invalid input(s): FREET3 Anemia work up No results for input(s): VITAMINB12, FOLATE, FERRITIN, TIBC, IRON, RETICCTPCT in the last 72 hours. Urinalysis    Component Value Date/Time   COLORURINE YELLOW  04/23/2021 2120   APPEARANCEUR CLEAR 04/23/2021 2120   LABSPEC 1.015 04/23/2021 2120   PHURINE 7.0 04/23/2021 2120   GLUCOSEU NEGATIVE 04/23/2021 2120   HGBUR SMALL (A) 04/23/2021 2120   BILIRUBINUR NEGATIVE 04/23/2021 2120   KETONESUR 5 (A) 04/23/2021 2120   PROTEINUR NEGATIVE 04/23/2021 2120   NITRITE NEGATIVE 04/23/2021 2120   LEUKOCYTESUR NEGATIVE 04/23/2021 2120   Sepsis Labs Invalid input(s): PROCALCITONIN,  WBC,  LACTICIDVEN Microbiology Recent Results (from the past 240 hour(s))  Resp Panel by RT-PCR (Flu A&B, Covid) Nasopharyngeal Swab     Status: None   Collection Time: 04/24/21 12:35 AM   Specimen: Nasopharyngeal Swab; Nasopharyngeal(NP) swabs in vial transport medium  Result Value Ref Range Status   SARS Coronavirus 2 by RT PCR NEGATIVE NEGATIVE Final    Comment: (NOTE) SARS-CoV-2 target nucleic acids are NOT DETECTED.  The SARS-CoV-2 RNA is generally detectable in upper respiratory specimens during the acute phase of infection. The lowest concentration of SARS-CoV-2 viral copies this assay can detect is 138 copies/mL. A negative result does not preclude SARS-Cov-2 infection and should not be used as the sole basis for treatment or other patient management decisions. A negative result may occur with  improper specimen collection/handling, submission of specimen other than nasopharyngeal swab, presence of viral mutation(s) within the areas targeted by this assay, and inadequate number of viral copies(<138 copies/mL). A negative result must be combined with clinical observations, patient history, and epidemiological information. The expected result is Negative.  Fact Sheet for Patients:  BloggerCourse.com  Fact Sheet for Healthcare Providers:  SeriousBroker.it  This test is no t yet approved or cleared by the [place] FDA and  has been authorized for detection and/or diagnosis of SARS-CoV-2 by FDA under an  Emergency Use Authorization (EUA). This EUA will remain  in effect (meaning this test can be used) for the duration of the COVID-19 declaration under Section 564(b)(1) of the Act, 21 U.S.C.section 360bbb-3(b)(1), unless the authorization is terminated  or revoked sooner.       Influenza A by PCR NEGATIVE NEGATIVE Final   Influenza B by PCR NEGATIVE NEGATIVE Final    Comment: (NOTE) The Xpert Xpress SARS-CoV-2/FLU/RSV plus assay is intended as an aid in the diagnosis of influenza from Nasopharyngeal swab specimens and should not be used as a sole basis for treatment. Nasal washings and aspirates are unacceptable for Xpert Xpress SARS-CoV-2/FLU/RSV testing.  Fact Sheet for Patients: BloggerCourse.com  Fact Sheet for Healthcare Providers: SeriousBroker.it  This test is not yet approved or cleared by the [place] FDA and has been authorized for detection and/or diagnosis of SARS-CoV-2 by FDA under an Emergency Use Authorization (EUA). This EUA will remain in effect (meaning this test can be used) for the duration of the COVID-19 declaration under Section 564(b)(1) of the Act, 21 U.S.C. section 360bbb-3(b)(1), unless the authorization is terminated  or revoked.  Performed at [facility], [address] [place], [place] [address]      Time coordinating discharge: Over 30 minutes  SIGNED:   Hughie Closs, MD  [facility] 04/28/2021, 11:01 AM  If 7PM-7AM, please contact night-coverage [email or web address]

## 2021-04-29 ENCOUNTER — Telehealth: Payer: Self-pay

## 2021-04-29 NOTE — Telephone Encounter (Signed)
Patient called in to say he was discharged yesterday after surgery and was supposed to have a prescription for amlodipine. Looked on patients DC list, patient was only prescribed ASA and plavix.

## 2021-05-03 ENCOUNTER — Ambulatory Visit: Payer: Medicare PPO | Admitting: Physical Therapy

## 2021-05-04 DIAGNOSIS — I10 Essential (primary) hypertension: Secondary | ICD-10-CM | POA: Diagnosis not present

## 2021-05-04 DIAGNOSIS — G903 Multi-system degeneration of the autonomic nervous system: Secondary | ICD-10-CM | POA: Diagnosis not present

## 2021-05-04 DIAGNOSIS — R410 Disorientation, unspecified: Secondary | ICD-10-CM | POA: Diagnosis not present

## 2021-05-05 ENCOUNTER — Ambulatory Visit: Payer: Medicare PPO

## 2021-05-06 ENCOUNTER — Other Ambulatory Visit: Payer: Self-pay

## 2021-05-06 ENCOUNTER — Encounter: Payer: Self-pay | Admitting: *Deleted

## 2021-05-06 NOTE — Progress Notes (Unsigned)

## 2021-05-06 NOTE — Patient Outreach (Signed)
[facility]) Care Management  05/06/2021  Nicholas Trompeter 10-06-1945 [id number]   EMMI- Stroke RED ON EMMI ALERT Day # 1 Date: 05/07/21 Red Alert Reason:Problems  setting up rehab.  Outreach attempt: Telephone call to patient for EMMI stroke follow up. Patient reports he is doing good.  Addressed red alert. Patient reports that he misunderstood the question.  He denies any other problems presently.  Declined further follow up at this time. Reiterated CM service and support.  He again declined for CM to follow up call.    CM did advised that he would get future automated calls and if answers trigger a questionable answer that CM would call again. He is agreeable to that.    Plan: RN CM will close case.    Bary Leriche, RN, MSN [facility] Care Management Care Management Coordinator Direct Line [phone] Toll Free: [phone]  Fax: [phone]

## 2021-05-06 NOTE — Progress Notes (Signed)
Hi Ms Terry [name] that patient's PCP is not with [facility] at this time.  Thank you, Leanna Sato

## 2021-05-09 DIAGNOSIS — G20 Parkinson's disease: Secondary | ICD-10-CM | POA: Diagnosis not present

## 2021-05-09 DIAGNOSIS — E611 Iron deficiency: Secondary | ICD-10-CM | POA: Diagnosis not present

## 2021-05-09 DIAGNOSIS — Z0001 Encounter for general adult medical examination with abnormal findings: Secondary | ICD-10-CM | POA: Diagnosis not present

## 2021-05-09 DIAGNOSIS — I169 Hypertensive crisis, unspecified: Secondary | ICD-10-CM | POA: Diagnosis not present

## 2021-05-09 DIAGNOSIS — E559 Vitamin D deficiency, unspecified: Secondary | ICD-10-CM | POA: Diagnosis not present

## 2021-05-09 DIAGNOSIS — R519 Headache, unspecified: Secondary | ICD-10-CM | POA: Diagnosis not present

## 2021-05-09 DIAGNOSIS — I10 Essential (primary) hypertension: Secondary | ICD-10-CM | POA: Diagnosis not present

## 2021-05-09 DIAGNOSIS — I639 Cerebral infarction, unspecified: Secondary | ICD-10-CM | POA: Diagnosis not present

## 2021-05-10 ENCOUNTER — Ambulatory Visit: Payer: Medicare PPO | Admitting: Physical Therapy

## 2021-05-10 NOTE — Progress Notes (Signed)
POST OPERATIVE OFFICE NOTE    CC:  F/u for surgery  HPI:  This is a 75 y.o. male who is s/p left CEA on 04/27/2021 by Dr. Myra Gianotti for symptomatic left carotid artery stenosis.    He had presented to the hospital on 04/24/2021 with a 4-day history of dizziness and a left-sided headache.  He also noticed some difficulty getting his words out.  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  He denies any other neurological symptoms.  The patient takes a statin for hypercholesterolemia.  He is medically managed for hypertension with an ARB.  He does have a history of Parkinson's disease.  Pt returns today for follow up accompanied by his wife.  Pt states he has been doing well.  He has not had any pain.  He sates that every now and then he has a little trouble with finding his words.  He has driven once yesterday to church and will increase this over time as he is comfortable.  His wife states that they are going to try to increase their walking.    Allergies  Allergen Reactions   Penicillins Rash   Erythromycin Rash   Calm Nausea And Vomiting    Current Outpatient Medications  Medication Sig Dispense Refill   acetaminophen (TYLENOL) 500 MG tablet Take 500 mg by mouth every 6 (six) hours as needed for moderate pain or headache.     amLODipine (NORVASC) 10 MG tablet Take 1 tablet (10 mg total) by mouth daily. (Patient not taking: Reported on 04/23/2021) 90 tablet 2   aspirin EC 81 MG EC tablet Take 1 tablet (81 mg total) by mouth daily. Swallow whole. 30 tablet 1   atorvastatin (LIPITOR) 20 MG tablet TAKE ONE TABLET BY MOUTH EVERY NIGHT AT BEDTIME (Patient taking differently: Take 20 mg by mouth every evening.) 90 tablet 0   betamethasone acetate-betamethasone sodium phosphate (CELESTONE) 6 (3-3) MG/ML injection Every 6 months     carbidopa-levodopa (SINEMET CR) 50-200 MG tablet Take 1 tablet by mouth at bedtime. 90 tablet 1    carbidopa-levodopa (SINEMET IR) 25-100 MG tablet Take 2.5 tablets by mouth 4 (four) times daily. 7am, 12pm, 5pm, 10pm     cetirizine (ZYRTEC) 10 MG tablet Take 10 mg by mouth as needed for allergies.      clopidogrel (PLAVIX) 75 MG tablet Take 1 tablet (75 mg total) by mouth daily for 20 days. 20 tablet 0   fluticasone (FLONASE) 50 MCG/ACT nasal spray Place 1 spray into both nostrils daily as needed for allergies.      meloxicam (MOBIC) 15 MG tablet Take 15 mg by mouth at bedtime as needed for pain.     NEUPRO 4 MG/24HR Place 1 patch onto the skin daily.     nitroGLYCERIN (NITROSTAT) 0.4 MG SL tablet Place 1 tablet (0.4 mg total) under the tongue every 5 (five) minutes as needed for chest pain. 90 tablet 3   omeprazole (PRILOSEC) 40 MG capsule Take 1 capsule (40 mg total) by mouth daily. 90 capsule 0   tiZANidine (ZANAFLEX) 2 MG tablet Take 2 mg by mouth daily as needed for muscle spasms.     traZODone (DESYREL) 50 MG tablet Take 1 tablet (50 mg total) by mouth at bedtime as needed for sleep. 90 tablet 0   valsartan (DIOVAN) 80 MG tablet Take 80 mg by mouth daily.     No current facility-administered medications for this  visit.     ROS:  See HPI  Physical Exam:  Today's Vitals   05/16/21 0841  BP: 139/71  Pulse: 67  Temp: 98 F (36.7 C)  TempSrc: Skin  SpO2: 100%  Weight: 180 lb 3.2 oz (81.7 kg)   Body mass index is 23.77 kg/m.   Incision:  healed nicely Extremities:  moving all extremities equally Neuro: in tact; speech is clear; tongue is midline; smile is equal.    Assessment/Plan:  This is a 75 y.o. male who is s/p: left CEA on 04/27/2021 by Dr. Myra Gianotti for symptomatic left carotid artery stenosis.  -doing well since surgery and slowly getting strength back.  Speech is clear today and improving.   --f/u in 9 months with carotid duplex -continue statin/asa/plavix   Doreatha Massed, [facility] [phone]   Clinic MD:  pt seen with Dr.  Myra Gianotti

## 2021-05-12 ENCOUNTER — Ambulatory Visit: Payer: Medicare PPO

## 2021-05-16 ENCOUNTER — Other Ambulatory Visit: Payer: Self-pay

## 2021-05-16 ENCOUNTER — Ambulatory Visit (INDEPENDENT_AMBULATORY_CARE_PROVIDER_SITE_OTHER): Payer: Medicare PPO | Admitting: Physician Assistant

## 2021-05-16 ENCOUNTER — Encounter: Payer: Self-pay | Admitting: Physician Assistant

## 2021-05-16 VITALS — BP 139/71 | HR 67 | Temp 98.0°F | Wt 180.2 lb

## 2021-05-16 DIAGNOSIS — M7989 Other specified soft tissue disorders: Secondary | ICD-10-CM

## 2021-05-16 DIAGNOSIS — I6523 Occlusion and stenosis of bilateral carotid arteries: Secondary | ICD-10-CM

## 2021-05-16 DIAGNOSIS — Z9889 Other specified postprocedural states: Secondary | ICD-10-CM

## 2021-05-17 ENCOUNTER — Ambulatory Visit: Payer: Medicare PPO | Admitting: Physical Therapy

## 2021-05-19 ENCOUNTER — Ambulatory Visit (INDEPENDENT_AMBULATORY_CARE_PROVIDER_SITE_OTHER): Payer: Medicare PPO | Admitting: Otolaryngology

## 2021-05-19 ENCOUNTER — Other Ambulatory Visit: Payer: Self-pay

## 2021-05-19 DIAGNOSIS — H6123 Impacted cerumen, bilateral: Secondary | ICD-10-CM | POA: Diagnosis not present

## 2021-05-19 NOTE — Progress Notes (Signed)
HPI: Terry [name] is a 75 y.o. male who presents for evaluation of wax buildup in his ears.  He follows up on a regular basis have his ears cleaned.  Of note he had a recent stroke and left carotid endarterectomy performed a couple months ago..  Past Medical History:  Diagnosis Date   Carotid artery stenosis 04/29/2018   Chronic tension headaches 04/29/2018   Essential hypertension 04/29/2018   GERD (gastroesophageal reflux disease) 04/29/2018   HLD (hyperlipidemia) 04/29/2018   Parkinson's disease (HCC) 04/29/2018   Past Surgical History:  Procedure Laterality Date   ENDARTERECTOMY Left 04/27/2021   Procedure: LEFT CAROTID ENDARTERECTOMY;  Surgeon: Nada Libman, MD;  Location: [facility] OR;  Service: Vascular;  Laterality: Left;   LEFT HEART CATH AND CORONARY ANGIOGRAPHY N/A 11/25/2019   Procedure: LEFT HEART CATH AND CORONARY ANGIOGRAPHY;  Surgeon: Lyn Records, MD;  Location: [facility] INVASIVE CV LAB;  Service: Cardiovascular;  Laterality: N/A;   PATCH ANGIOPLASTY Left 04/27/2021   Procedure: PATCH ANGIOPLASTY USING Livia Snellen BIOLOGIC PATCH;  Surgeon: Nada Libman, MD;  Location: [facility] OR;  Service: Vascular;  Laterality: Left;   TONSILLECTOMY     Social History   Socioeconomic History   Marital status: Widowed    Spouse name: Not on file   Number of children: Not on file   Years of education: Not on file   Highest education level: Not on file  Occupational History   Not on file  Tobacco Use   Smoking status: Never   Smokeless tobacco: Current    Types: Snuff  Substance and Sexual Activity   Alcohol use: Yes    Comment: Occasionally.   Drug use: Never   Sexual activity: Not on file  Other Topics Concern   Not on file  Social History Narrative   Not on file   Social Determinants of Health   Financial Resource Strain: Not on file  Food Insecurity: Not on file  Transportation Needs: Not on file  Physical Activity: Not on file  Stress: Not on file  Social Connections: Not on file    Family History  Problem Relation Age of Onset   Heart disease Mother    Cancer Father    Allergies  Allergen Reactions   Penicillins Rash   Erythromycin Rash   Calm Nausea And Vomiting   Prior to Admission medications   Medication Sig Start Date End Date Taking? Authorizing Provider  acetaminophen (TYLENOL) 500 MG tablet Take 500 mg by mouth every 6 (six) hours as needed for moderate pain or headache.    [provider]  amLODipine (NORVASC) 10 MG tablet Take 1 tablet (10 mg total) by mouth daily. Patient taking differently: Take 5 mg by mouth in the morning and at bedtime. 11/18/19 02/16/20  Sande Rives, MD  aspirin EC 81 MG EC tablet Take 1 tablet (81 mg total) by mouth daily. Swallow whole. 04/29/21   Hughie Closs, MD  atorvastatin (LIPITOR) 20 MG tablet TAKE ONE TABLET BY MOUTH EVERY NIGHT AT BEDTIME Patient taking differently: Take 20 mg by mouth every evening. 05/19/20   [place], Karma Ganja D, DO  betamethasone acetate-betamethasone sodium phosphate (CELESTONE) 6 (3-3) MG/ML injection Every 6 months 11/11/19   [provider]  carbidopa-levodopa (SINEMET CR) 50-200 MG tablet Take 1 tablet by mouth at bedtime. 07/29/18   Lanelle Bal, MD  carbidopa-levodopa (SINEMET IR) 25-100 MG tablet Take 2.5 tablets by mouth 4 (four) times daily. 7am, 12pm, 5pm, 10pm    [provider]  cetirizine (ZYRTEC) 10 MG tablet Take 10 mg by mouth as needed for allergies.     [provider]  clopidogrel (PLAVIX) 75 MG tablet Take 1 tablet (75 mg total) by mouth daily for 20 days. 04/29/21 05/19/21  Hughie Closs, MD  fluticasone (FLONASE) 50 MCG/ACT nasal spray Place 1 spray into both nostrils daily as needed for allergies.     [provider]  meloxicam (MOBIC) 15 MG tablet Take 15 mg by mouth at bedtime as needed for pain. 11/03/19   [provider]  NEUPRO 4 MG/24HR Place 1 patch onto the skin daily. 04/19/21   [provider]   nitroGLYCERIN (NITROSTAT) 0.4 MG SL tablet Place 1 tablet (0.4 mg total) under the tongue every 5 (five) minutes as needed for chest pain. 10/16/19 04/23/21  O'NealRonnald Ramp, MD  omeprazole (PRILOSEC) 40 MG capsule Take 1 capsule (40 mg total) by mouth daily. 04/13/20   Elige Radon, MD  tiZANidine (ZANAFLEX) 2 MG tablet Take 2 mg by mouth daily as needed for muscle spasms. 04/18/21   [provider]  traZODone (DESYREL) 50 MG tablet Take 1 tablet (50 mg total) by mouth at bedtime as needed for sleep. 09/03/18   Lanelle Bal, MD  valsartan (DIOVAN) 80 MG tablet Take 80 mg by mouth daily. 03/23/21   [provider]     Positive ROS: Otherwise negative  All other systems have been reviewed and were otherwise negative with the exception of those mentioned in the HPI and as above.  Physical Exam: Constitutional: Alert, well-appearing, no acute distress Ears: External ears without lesions or tenderness. Ear canals with moderate wax buildup in both ear canals.  Of note left ear canal smaller than the right ear canal.  Ear canals were cleaned with curette and suction.  TMs were otherwise clear.. Nasal: External nose without lesions. Clear nasal passages Oral: Oropharynx clear. Neck: No palpable adenopathy or masses Respiratory: Breathing comfortably  Skin: No facial/neck lesions or rash noted.  Cerumen impaction removal  Date/Time: 05/19/2021 1:16 PM Performed by: Drema Halon, MD Authorized by: Drema Halon, MD   Consent:    Consent obtained:  Verbal   Consent given by:  Patient   Risks discussed:  Pain and bleeding Procedure details:    Location:  L ear and R ear   Procedure type: curette and suction   Post-procedure details:    Inspection:  TM intact and canal normal   Hearing quality:  Improved   Procedure completion:  Tolerated well, no immediate complications Comments:     Left ear canal is little bit smaller than the right ear  canal.  Ear canals were cleaned with curette and suction.  TMs were clear bilaterally.  Assessment: Cerumen buildup bilaterally.  Plan: Ear canals were cleaned in the office.  He will follow-up with one of the other ENT groups in the future as I will be retiring.  Narda Bonds, MD

## 2021-05-20 ENCOUNTER — Ambulatory Visit: Payer: Medicare PPO | Admitting: Physical Therapy

## 2021-05-20 ENCOUNTER — Other Ambulatory Visit: Payer: Self-pay

## 2021-05-20 DIAGNOSIS — I6523 Occlusion and stenosis of bilateral carotid arteries: Secondary | ICD-10-CM

## 2021-05-23 DIAGNOSIS — K219 Gastro-esophageal reflux disease without esophagitis: Secondary | ICD-10-CM | POA: Diagnosis not present

## 2021-05-23 DIAGNOSIS — I10 Essential (primary) hypertension: Secondary | ICD-10-CM | POA: Diagnosis not present

## 2021-05-23 DIAGNOSIS — R0609 Other forms of dyspnea: Secondary | ICD-10-CM | POA: Diagnosis not present

## 2021-05-23 DIAGNOSIS — F4320 Adjustment disorder, unspecified: Secondary | ICD-10-CM | POA: Diagnosis not present

## 2021-05-23 DIAGNOSIS — R400 Somnolence: Secondary | ICD-10-CM | POA: Diagnosis not present

## 2021-05-23 DIAGNOSIS — Z0000 Encounter for general adult medical examination without abnormal findings: Secondary | ICD-10-CM | POA: Diagnosis not present

## 2021-05-23 DIAGNOSIS — G20 Parkinson's disease: Secondary | ICD-10-CM | POA: Diagnosis not present

## 2021-05-24 ENCOUNTER — Ambulatory Visit: Payer: Medicare PPO | Admitting: Physical Therapy

## 2021-05-25 DIAGNOSIS — G903 Multi-system degeneration of the autonomic nervous system: Secondary | ICD-10-CM | POA: Diagnosis not present

## 2021-05-25 DIAGNOSIS — R269 Unspecified abnormalities of gait and mobility: Secondary | ICD-10-CM | POA: Diagnosis not present

## 2021-05-25 DIAGNOSIS — G20 Parkinson's disease: Secondary | ICD-10-CM | POA: Diagnosis not present

## 2021-05-25 DIAGNOSIS — E782 Mixed hyperlipidemia: Secondary | ICD-10-CM | POA: Diagnosis not present

## 2021-05-25 DIAGNOSIS — I10 Essential (primary) hypertension: Secondary | ICD-10-CM | POA: Diagnosis not present

## 2021-05-25 DIAGNOSIS — I700 Atherosclerosis of aorta: Secondary | ICD-10-CM | POA: Diagnosis not present

## 2021-05-26 ENCOUNTER — Ambulatory Visit: Payer: Medicare PPO

## 2021-05-30 ENCOUNTER — Encounter: Payer: Self-pay | Admitting: Nurse Practitioner

## 2021-05-30 DIAGNOSIS — I10 Essential (primary) hypertension: Secondary | ICD-10-CM | POA: Diagnosis not present

## 2021-05-30 DIAGNOSIS — K5792 Diverticulitis of intestine, part unspecified, without perforation or abscess without bleeding: Secondary | ICD-10-CM | POA: Diagnosis not present

## 2021-05-30 DIAGNOSIS — R109 Unspecified abdominal pain: Secondary | ICD-10-CM | POA: Diagnosis not present

## 2021-05-30 DIAGNOSIS — G20 Parkinson's disease: Secondary | ICD-10-CM | POA: Diagnosis not present

## 2021-05-30 NOTE — Progress Notes (Signed)
Cardiology Clinic Note   Patient Name: Terry [name] Date of Encounter: 05/30/2021  Primary Care Provider:  Merrilee Seashore, MD Primary Cardiologist:  Evalina Field, MD  Patient Profile    Terry [name] 75 year old male presents to the clinic today for follow-up evaluation of his coronary artery disease and essential hypertension.  Past Medical History    Past Medical History:  Diagnosis Date   Carotid artery stenosis 04/29/2018   Chronic tension headaches 04/29/2018   Essential hypertension 04/29/2018   GERD (gastroesophageal reflux disease) 04/29/2018   HLD (hyperlipidemia) 04/29/2018   Parkinson's disease ([place]) 04/29/2018   Past Surgical History:  Procedure Laterality Date   ENDARTERECTOMY Left 04/27/2021   Procedure: LEFT CAROTID ENDARTERECTOMY;  Surgeon: Serafina Mitchell, MD;  Location: [facility];  Service: Vascular;  Laterality: Left;   LEFT HEART CATH AND CORONARY ANGIOGRAPHY N/A 11/25/2019   Procedure: LEFT HEART CATH AND CORONARY ANGIOGRAPHY;  Surgeon: Belva Crome, MD;  Location: [place] [facility];  Service: Cardiovascular;  Laterality: N/A;   PATCH ANGIOPLASTY Left 04/27/2021   Procedure: PATCH ANGIOPLASTY USING Rueben Bash BIOLOGIC PATCH;  Surgeon: Serafina Mitchell, MD;  Location: [facility] OR;  Service: Vascular;  Laterality: Left;   TONSILLECTOMY      Allergies  Allergies  Allergen Reactions   Penicillins Rash   Erythromycin Rash   Calm Nausea And Vomiting    History of Present Illness    Terry [name] has a PMH of hyperlipidemia, hypertension, Parkinson's, coronary artery disease, and ED.  He underwent cardiac catheterization which showed minimal coronary artery disease.  He was evaluated for typical angina.  On follow-up with Dr. Audie Box 12/05/2019 he reported that he was relieved to know that his coronary arteries looked okay.  He was noted to have mild plaque.  His lipid panel at that time showed an LDL of 38.  Statin medication was not started.  His EKG was normal.   He indicated that he was having issues with ED.  He had tried several ED medications.  He was recommended to seek advice from urology.  He was not noted significant risk factors for PAD.  He reported minimal tobacco use in the past.  He noted 3 years of smoking and 83 years of snuff use.  He was not using tobacco products at the time of the visit.  His medications were reviewed.  He also discussed the issue with his neurologist to see if his medications could be changed.  On exam he was noted to have 2+ bilateral lower extremity pulses.  He denied claudication.  He denied chest discomfort shortness of breath and palpitations.  He was advised to stop taking nitroglycerin because his chest discomfort was not related to cardiac issues.  He presents to the clinic today for follow-up evaluation states he feels well.  He underwent carotid endarterectomy for his stenosed left carotid artery.  He tolerated the surgery well.  His incision is clean dry intact no drainage and with no signs of infection.  He reports that he continues to recover well.  We reviewed his angiography and he expressed understanding.  We reviewed his lipid panel and his LDL cholesterol.  He expressed understanding.  He is tolerating his medications well without side effects.  He reports that he married his high school sweetheart recently and is excited to spend time with her.  We will plan follow-up for 1 year, continue his current medication regimen, and have him continue his current diet (vegan/plant-based).  Today he denies  chest pain, shortness of breath, lower extremity edema, fatigue, palpitations, melena, hematuria, hemoptysis, diaphoresis, weakness, presyncope, syncope, orthopnea, and PND.   Home Medications    Prior to Admission medications   Medication Sig Start Date End Date Taking? Authorizing Provider  acetaminophen (TYLENOL) 500 MG tablet Take 500 mg by mouth every 6 (six) hours as needed for moderate pain or headache.     [provider]  amLODipine (NORVASC) 10 MG tablet Take 1 tablet (10 mg total) by mouth daily. Patient taking differently: Take 5 mg by mouth in the morning and at bedtime. 11/18/19 02/16/20  Sande Rives, MD  aspirin EC 81 MG EC tablet Take 1 tablet (81 mg total) by mouth daily. Swallow whole. 04/29/21   Hughie Closs, MD  atorvastatin (LIPITOR) 20 MG tablet TAKE ONE TABLET BY MOUTH EVERY NIGHT AT BEDTIME Patient taking differently: Take 20 mg by mouth every evening. 05/19/20   [place], Karma Ganja D, DO  betamethasone acetate-betamethasone sodium phosphate (CELESTONE) 6 (3-3) MG/ML injection Every 6 months 11/11/19   [provider]  carbidopa-levodopa (SINEMET CR) 50-200 MG tablet Take 1 tablet by mouth at bedtime. 07/29/18   Lanelle Bal, MD  carbidopa-levodopa (SINEMET IR) 25-100 MG tablet Take 2.5 tablets by mouth 4 (four) times daily. 7am, 12pm, 5pm, 10pm    [provider]  cetirizine (ZYRTEC) 10 MG tablet Take 10 mg by mouth as needed for allergies.     [provider]  fluticasone (FLONASE) 50 MCG/ACT nasal spray Place 1 spray into both nostrils daily as needed for allergies.     [provider]  meloxicam (MOBIC) 15 MG tablet Take 15 mg by mouth at bedtime as needed for pain. 11/03/19   [provider]  NEUPRO 4 MG/24HR Place 1 patch onto the skin daily. 04/19/21   [provider]  nitroGLYCERIN (NITROSTAT) 0.4 MG SL tablet Place 1 tablet (0.4 mg total) under the tongue every 5 (five) minutes as needed for chest pain. 10/16/19 04/23/21  O'NealRonnald Ramp, MD  omeprazole (PRILOSEC) 40 MG capsule Take 1 capsule (40 mg total) by mouth daily. 04/13/20   Elige Radon, MD  tiZANidine (ZANAFLEX) 2 MG tablet Take 2 mg by mouth daily as needed for muscle spasms. 04/18/21   [provider]  traZODone (DESYREL) 50 MG tablet Take 1 tablet (50 mg total) by mouth at bedtime as needed for sleep. 09/03/18   Lanelle Bal, MD  valsartan (DIOVAN) 80 MG tablet Take 80 mg by mouth daily. 03/23/21   [provider]    Family History    Family History  Problem Relation Age of Onset   Heart disease Mother    Cancer Father    He indicated that the status of his mother is unknown. He indicated that the status of his father is unknown.  Social History    Social History   Socioeconomic History   Marital status: Widowed    Spouse name: Not on file   Number of children: Not on file   Years of education: Not on file   Highest education level: Not on file  Occupational History   Not on file  Tobacco Use   Smoking status: Never   Smokeless tobacco: Current    Types: Snuff  Substance and Sexual Activity   Alcohol use: Yes    Comment: Occasionally.   Drug use: Never   Sexual activity: Not on file  Other Topics Concern   Not on file  Social History Narrative  Not on file   Social Determinants of Health   Financial Resource Strain: Not on file  Food Insecurity: Not on file  Transportation Needs: Not on file  Physical Activity: Not on file  Stress: Not on file  Social Connections: Not on file  Intimate Partner Violence: Not on file     Review of Systems    General:  No chills, fever, night sweats or weight changes.  Cardiovascular:  No chest pain, dyspnea on exertion, edema, orthopnea, palpitations, paroxysmal nocturnal dyspnea. Dermatological: No rash, lesions/masses Respiratory: No cough, dyspnea Urologic: No hematuria, dysuria Abdominal:   No nausea, vomiting, diarrhea, bright red blood per rectum, melena, or hematemesis Neurologic:  No visual changes, wkns, changes in mental status. All other systems reviewed and are otherwise negative except as noted above.  Physical Exam    VS:  There were no vitals taken for this visit. , BMI There is no height or weight on file to calculate BMI. GEN: Well nourished, well developed, in no acute distress. HEENT: normal. Neck:  Supple, no JVD, carotid bruits, or masses. Cardiac: RRR, no murmurs, rubs, or gallops. No clubbing, cyanosis, edema.  Radials/DP/PT 2+ and equal bilaterally.  Respiratory:  Respirations regular and unlabored, clear to auscultation bilaterally. GI: Soft, nontender, nondistended, BS + x 4. MS: no deformity or atrophy. Skin: warm and dry, no rash. Neuro:  Strength and sensation are intact. Psych: Normal affect.  Accessory Clinical Findings    Recent Labs: 04/24/2021: ALT <5; Magnesium 2.0 04/25/2021: TSH 1.272 04/28/2021: BUN 21; Creatinine, Ser 0.86; Hemoglobin 12.2; Platelets 171; Potassium 4.1; Sodium 135   Recent Lipid Panel    Component Value Date/Time   CHOL 78 04/28/2021 0445   CHOL 99 (L) 12/10/2019 1112   TRIG 52 04/28/2021 0445   HDL 37 (L) 04/28/2021 0445   HDL 47 12/10/2019 1112   CHOLHDL 2.1 04/28/2021 0445   VLDL 10 04/28/2021 0445   LDLCALC 31 04/28/2021 0445   LDLCALC 39 12/10/2019 1112    ECG personally reviewed by me today-none today.  Cardiac catheterization 11/25/2019 Normal left ventricular function with EF 65%.  Normal LVEDP. Normal left main. Luminal irregularities in the proximal to mid LAD less than 20%. Large ramus intermedius with luminal irregularities.  Widely patent. Dominant right coronary without evidence of obstruction or atherosclerosis.   RECOMMENDATIONS:   Continue preventive therapy for CAD. Dyspnea on exertion could be related to diastolic dysfunction related to age or deconditioning.  Diagnostic Dominance: Right Intervention   Echocardiogram 04/24/2021 IMPRESSIONS     1. Left ventricular ejection fraction, by estimation, is 65 to 70%. The  left ventricle has normal function. The left ventricle has no regional  wall motion abnormalities. There is moderate left ventricular hypertrophy.  Left ventricular diastolic  parameters are indeterminate.   2. Right ventricular systolic function is normal. The right ventricular  size is  normal.   3. Left atrial size was mildly dilated.   4. Right atrial size was mildly dilated.   5. The mitral valve is grossly normal. No evidence of mitral valve  regurgitation.   6. The aortic valve is tricuspid. Aortic valve regurgitation is not  visualized.   Comparison(s): Prior images reviewed side by side. No significant change  in LVEF.   Assessment & Plan   1.  Coronary artery disease-denies recent episodes of arm neck back or chest discomfort.  Underwent cardiac catheterization 11/25/2019 which showed nonobstructive coronary artery disease.  Details above. Continue aspirin, amlodipine, nitroglycerin as needed Heart  healthy low-sodium diet-salty 6 given Increase physical activity as tolerated  Essential hypertension-BP today 132/64.  Well-controlled at home. Continue amlodipine, valsartan Heart healthy low-sodium diet-salty 6 given Increase physical activity as tolerated  Hyperlipidemia-LDL 38. Continue atorvastatin Heart healthy low-sodium high-fiber diet Maintain physical activity  Parkinson's disease-stable at this time. Continue current medical therapy Follows with PCP/neurology  Disposition: Follow-up with Dr. Audie Box or me in 9-12 months.  Jossie Ng. Sabah Zucco NP-C    05/30/2021, 10:01 AM [place] [address] Office [phone] Fax [phone]  Notice: This dictation was prepared with Dragon dictation along with smaller phrase technology. Any transcriptional errors that result from this process are unintentional and may not be corrected upon review.  I spent 13 minutes examining this patient, reviewing medications, and using patient centered shared decision making involving her cardiac care.  Prior to her visit I spent greater than 20 minutes reviewing her past medical history,  medications, and prior cardiac tests.

## 2021-05-31 ENCOUNTER — Ambulatory Visit: Payer: Medicare PPO | Admitting: Physical Therapy

## 2021-06-01 ENCOUNTER — Encounter: Payer: Self-pay | Admitting: General Practice

## 2021-06-01 ENCOUNTER — Other Ambulatory Visit: Payer: Self-pay

## 2021-06-01 ENCOUNTER — Ambulatory Visit: Payer: Medicare PPO | Admitting: General Practice

## 2021-06-01 VITALS — BP 132/64 | HR 69 | Ht 72.0 in | Wt 175.6 lb

## 2021-06-01 DIAGNOSIS — I10 Essential (primary) hypertension: Secondary | ICD-10-CM

## 2021-06-01 DIAGNOSIS — E782 Mixed hyperlipidemia: Secondary | ICD-10-CM | POA: Diagnosis not present

## 2021-06-01 DIAGNOSIS — I2510 Atherosclerotic heart disease of native coronary artery without angina pectoris: Secondary | ICD-10-CM | POA: Diagnosis not present

## 2021-06-01 NOTE — Patient Instructions (Signed)
Medication Instructions:  The current medical regimen is effective;  continue present plan and medications as directed. Please refer to the Current Medication list given to you today.   *If you need a refill on your cardiac medications before your next appointment, please call your pharmacy*  Lab Work:   Testing/Procedures:  NON    NONE  Special Instructions   Follow-Up: Your next appointment:  12 month(s) In [name] with Terry Harps, MD  or Terry Fabian, FNP, Terry Flesher, PA-C, Terry Skiff, PA-C, Terry Leu, PA-C, Terry Crumble, PA-C, Terry Reining, DNP, ANP, Terry Course, PA-C, or Terry Person, NP      Please call our office 2 months in advance to schedule this appointment   At [facility], you and your health needs are our priority.  As part of our continuing mission to provide you with exceptional heart care, we have created designated Provider Care Teams.  These Care Teams include your primary Cardiologist (physician) and Advanced Practice Providers (APPs -  Physician Assistants and Nurse Practitioners) who all work together to provide you with the care you need, when you need it.

## 2021-06-02 ENCOUNTER — Ambulatory Visit: Payer: Medicare PPO

## 2021-06-02 DIAGNOSIS — G259 Extrapyramidal and movement disorder, unspecified: Secondary | ICD-10-CM | POA: Diagnosis not present

## 2021-06-02 DIAGNOSIS — I951 Orthostatic hypotension: Secondary | ICD-10-CM | POA: Diagnosis not present

## 2021-06-02 DIAGNOSIS — Z9889 Other specified postprocedural states: Secondary | ICD-10-CM | POA: Diagnosis not present

## 2021-06-02 DIAGNOSIS — R42 Dizziness and giddiness: Secondary | ICD-10-CM | POA: Diagnosis not present

## 2021-06-02 DIAGNOSIS — Z7182 Exercise counseling: Secondary | ICD-10-CM | POA: Diagnosis not present

## 2021-06-02 DIAGNOSIS — I959 Hypotension, unspecified: Secondary | ICD-10-CM | POA: Diagnosis not present

## 2021-06-02 DIAGNOSIS — Z79899 Other long term (current) drug therapy: Secondary | ICD-10-CM | POA: Diagnosis not present

## 2021-06-02 DIAGNOSIS — R269 Unspecified abnormalities of gait and mobility: Secondary | ICD-10-CM | POA: Diagnosis not present

## 2021-06-02 DIAGNOSIS — G20 Parkinson's disease: Secondary | ICD-10-CM | POA: Diagnosis not present

## 2021-06-02 DIAGNOSIS — Z8673 Personal history of transient ischemic attack (TIA), and cerebral infarction without residual deficits: Secondary | ICD-10-CM | POA: Diagnosis not present

## 2021-06-02 DIAGNOSIS — R4781 Slurred speech: Secondary | ICD-10-CM | POA: Diagnosis not present

## 2021-06-07 ENCOUNTER — Ambulatory Visit: Payer: Medicare PPO | Admitting: Physical Therapy

## 2021-06-07 DIAGNOSIS — M25462 Effusion, left knee: Secondary | ICD-10-CM | POA: Diagnosis not present

## 2021-06-07 DIAGNOSIS — M25461 Effusion, right knee: Secondary | ICD-10-CM | POA: Diagnosis not present

## 2021-06-07 DIAGNOSIS — K5752 Diverticulitis of both small and large intestine without perforation or abscess without bleeding: Secondary | ICD-10-CM | POA: Diagnosis not present

## 2021-06-09 ENCOUNTER — Ambulatory Visit: Payer: Medicare PPO

## 2021-06-09 DIAGNOSIS — K551 Chronic vascular disorders of intestine: Secondary | ICD-10-CM | POA: Diagnosis not present

## 2021-06-09 DIAGNOSIS — G20 Parkinson's disease: Secondary | ICD-10-CM | POA: Diagnosis not present

## 2021-06-09 DIAGNOSIS — R109 Unspecified abdominal pain: Secondary | ICD-10-CM | POA: Diagnosis not present

## 2021-06-09 DIAGNOSIS — I959 Hypotension, unspecified: Secondary | ICD-10-CM | POA: Diagnosis not present

## 2021-06-10 ENCOUNTER — Telehealth: Payer: Self-pay

## 2021-06-10 NOTE — Telephone Encounter (Signed)
Patient is s/p carotid in October 2022 and has been doing well. Over the last few days he has developed abdominal pain and hypotension. Per wife, he was seen at PCP yesterday and they gave him an infusion for dehydration. They were concerned it was diverticulitis and gave him antibiotics, but per CT - diverticulitis was not the issue. I am unable to see results of CT on Epic. Says the PCP wants him to be seen by [place] for possible vascular abdominal issues. Advised her we would need a referral and she will have PCP send that in. In the meantime, patient is still having labile pressures and 8/10 pain. Recommended visit to [facility] ED for evaluation.

## 2021-06-14 DIAGNOSIS — I6529 Occlusion and stenosis of unspecified carotid artery: Secondary | ICD-10-CM | POA: Insufficient documentation

## 2021-06-14 DIAGNOSIS — M1712 Unilateral primary osteoarthritis, left knee: Secondary | ICD-10-CM | POA: Diagnosis not present

## 2021-06-14 DIAGNOSIS — I700 Atherosclerosis of aorta: Secondary | ICD-10-CM | POA: Insufficient documentation

## 2021-06-14 DIAGNOSIS — M1711 Unilateral primary osteoarthritis, right knee: Secondary | ICD-10-CM | POA: Diagnosis not present

## 2021-06-14 DIAGNOSIS — K5730 Diverticulosis of large intestine without perforation or abscess without bleeding: Secondary | ICD-10-CM | POA: Insufficient documentation

## 2021-06-20 ENCOUNTER — Ambulatory Visit: Payer: Medicare PPO | Admitting: Nurse Practitioner

## 2021-06-21 DIAGNOSIS — M170 Bilateral primary osteoarthritis of knee: Secondary | ICD-10-CM | POA: Diagnosis not present

## 2021-06-22 ENCOUNTER — Telehealth: Payer: Self-pay

## 2021-06-22 NOTE — Telephone Encounter (Signed)
Pt's wife called to inquire about making appt. I explained to her since we have not received his referral yet, that we can not make his appt. She is going to call back that office to let them know we have not received it. I attempted to confirm she had our fax # and she had already hung up on me.

## 2021-06-26 ENCOUNTER — Other Ambulatory Visit: Payer: Self-pay

## 2021-06-26 DIAGNOSIS — K551 Chronic vascular disorders of intestine: Secondary | ICD-10-CM

## 2021-06-27 ENCOUNTER — Other Ambulatory Visit: Payer: Self-pay

## 2021-06-27 ENCOUNTER — Ambulatory Visit (HOSPITAL_COMMUNITY)
Admission: RE | Admit: 2021-06-27 | Discharge: 2021-06-27 | Disposition: A | Discharge status: Home / Self Care | Payer: Medicare PPO | Source: Ambulatory Visit | Attending: Surgery | Admitting: Surgery

## 2021-06-27 ENCOUNTER — Encounter: Payer: Self-pay | Admitting: Surgery

## 2021-06-27 ENCOUNTER — Ambulatory Visit: Payer: Medicare PPO | Admitting: Surgery

## 2021-06-27 VITALS — BP 178/71 | HR 61 | Temp 98.1°F | Resp 20 | Ht 72.0 in | Wt 171.0 lb

## 2021-06-27 DIAGNOSIS — K551 Chronic vascular disorders of intestine: Secondary | ICD-10-CM | POA: Diagnosis not present

## 2021-06-27 NOTE — Progress Notes (Signed)
[facility]  Patient name: Terry [name] MRN: [id number] DOB: 1946/04/25 Sex: male   REQUESTING PROVIDER:    Dr. Hal Hope   REASON FOR CONSULT:    ? Mesenteric stenosis  HISTORY OF PRESENT ILLNESS:   Terry [name] is a 75 y.o. male, who is known to me having recently undergone left carotid endarterectomy for symptomatic stenosis on 04/27/2021.  He is now here for further evaluation of weight loss.  He states that the last 6 weeks he has lost approximately 30 pounds.  He denies postprandial abdominal pain, however he will get lower abdominal pain when he wakes up in the morning and slowly gets better throughout the day.  He also endorses not eating as much and getting full quicker.   The patient takes a statin for hypercholesterolemia.  He is medically managed for hypertension with an ARB.  He does have a history of Parkinson's disease.  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[employer] [place]  PAST MEDICAL HISTORY    Past Medical History:  Diagnosis Date   Carotid artery stenosis 04/29/2018   Chronic tension headaches 04/29/2018   Essential hypertension 04/29/2018   GERD (gastroesophageal reflux disease) 04/29/2018   HLD (hyperlipidemia) 04/29/2018   Parkinson's disease ([facility]) 04/29/2018     FAMILY HISTORY   Family History  Problem Relation Age of Onset   Heart disease Mother    Cancer Father     SOCIAL HISTORY:   Social History   Socioeconomic History   Marital status: Married    Spouse name: Not on file   Number of children: Not on file   Years of education: Not on file   Highest education level: Not on file  Occupational History   Not on file  Tobacco Use   Smoking status: Never   Smokeless tobacco: Current    Types: Snuff  Vaping Use   Vaping Use: Never used  Substance and Sexual Activity   Alcohol use: Yes    Comment: Occasionally.   Drug use: Never   Sexual activity: Not on file  Other  Topics Concern   Not on file  Social History Narrative   Not on file   Social Determinants of Health   Financial Resource Strain: Not on file  Food Insecurity: Not on file  Transportation Needs: Not on file  Physical Activity: Not on file  Stress: Not on file  Social Connections: Not on file  Intimate Partner Violence: Not on file    ALLERGIES:    Allergies  Allergen Reactions   Penicillins Rash   Erythromycin Rash   Calm Nausea And Vomiting    CURRENT MEDICATIONS:    Current Outpatient Medications  Medication Sig Dispense Refill   acetaminophen (TYLENOL) 500 MG tablet Take 500 mg by mouth every 6 (six) hours as needed for moderate pain or headache.     aspirin EC 81 MG EC tablet Take 1 tablet (81 mg total) by mouth daily. Swallow whole. 30 tablet 1   atorvastatin (LIPITOR) 20 MG tablet TAKE ONE TABLET BY MOUTH EVERY NIGHT AT BEDTIME (Patient taking differently: Take 20 mg by mouth every evening.) 90 tablet 0   carbidopa-levodopa (SINEMET CR) 50-200 MG tablet Take 1 tablet by mouth at bedtime. 90 tablet 1   carbidopa-levodopa (SINEMET IR) 25-100 MG tablet Take 2.5 tablets by mouth 4 (four) times daily. 7am, 12pm, 5pm, 10pm     cetirizine (ZYRTEC) 10 MG tablet Take 10 mg by mouth as needed for  allergies.      fluticasone (FLONASE) 50 MCG/ACT nasal spray Place 1 spray into both nostrils daily as needed for allergies.      meloxicam (MOBIC) 15 MG tablet Take 15 mg by mouth at bedtime as needed for pain.     NEUPRO 4 MG/24HR Place 1 patch onto the skin daily.     omeprazole (PRILOSEC) 40 MG capsule Take 1 capsule (40 mg total) by mouth daily. 90 capsule 0   tiZANidine (ZANAFLEX) 2 MG tablet Take 2 mg by mouth daily as needed for muscle spasms.     traZODone (DESYREL) 50 MG tablet Take 1 tablet (50 mg total) by mouth at bedtime as needed for sleep. 90 tablet 0   valsartan (DIOVAN) 80 MG tablet Take 80 mg by mouth daily.     amLODipine (NORVASC) 10 MG tablet Take 1 tablet (10 mg  total) by mouth daily. (Patient taking differently: Take 5 mg by mouth in the morning and at bedtime.) 90 tablet 2   nitroGLYCERIN (NITROSTAT) 0.4 MG SL tablet Place 1 tablet (0.4 mg total) under the tongue every 5 (five) minutes as needed for chest pain. 90 tablet 3   No current facility-administered medications for this visit.    REVIEW OF SYSTEMS:   [X]  denotes positive finding, [ ]  denotes negative finding Cardiac  Comments:  Chest pain or chest pressure:    Shortness of breath upon exertion:    Short of breath when lying flat:    Irregular heart rhythm:        Vascular    Pain in calf, thigh, or hip brought on by ambulation:    Pain in feet at night that wakes you up from your sleep:     Blood clot in your veins:    Leg swelling:         Pulmonary    Oxygen at home:    Productive cough:     Wheezing:         Neurologic    Sudden weakness in arms or legs:     Sudden numbness in arms or legs:     Sudden onset of difficulty speaking or slurred speech:    Temporary loss of vision in one eye:     Problems with dizziness:         Gastrointestinal    Blood in stool:      Vomited blood:         Genitourinary    Burning when urinating:     Blood in urine:        Psychiatric    Major depression:         Hematologic    Bleeding problems:    Problems with blood clotting too easily:        Skin    Rashes or ulcers:        Constitutional    Fever or chills:     PHYSICAL EXAM:   Vitals:   06/27/21 1216  BP: (!) 178/71  Pulse: 61  Resp: 20  Temp: 98.1 F (36.7 C)  SpO2: 96%  Weight: 171 lb (77.6 kg)  Height: 6' (1.829 m)    GENERAL: The patient is a well-nourished male, in no acute distress. The vital signs are documented above. CARDIAC: There is a regular rate and rhythm.  PULMONARY: Nonlabored respirations ABDOMEN: Soft and non-tender.  No pulsatile mass, no other masses.  No hernia MUSCULOSKELETAL: There are no major deformities or  cyanosis. NEUROLOGIC: No focal  weakness or paresthesias are detected. SKIN: There are no ulcers or rashes noted. PSYCHIATRIC: The patient has a normal affect.  STUDIES:   I have reviewed his mesenteric ultrasound with the following findings: Velocities in the proximal SMA are in the >70% stenosis range.  Velocities in the proximal IMA are in the >70% stenosis range.      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  I am going to order a CT angiogram to better define his anatomy and see if there are other intra-abdominal explanations.  I am also making referral to GI given his early satiety, weight loss, and lower abdominal pain.  He will follow-up with me after his CT scan.   Charlena Cross, MD, FACS [facility] [phone] Pager [phone]

## 2021-07-01 ENCOUNTER — Ambulatory Visit (HOSPITAL_COMMUNITY)
Admission: RE | Admit: 2021-07-01 | Discharge: 2021-07-01 | Disposition: A | Discharge status: Home / Self Care | Payer: Medicare PPO | Source: Ambulatory Visit | Attending: Surgery | Admitting: Surgery

## 2021-07-01 ENCOUNTER — Other Ambulatory Visit: Payer: Self-pay

## 2021-07-01 DIAGNOSIS — K551 Chronic vascular disorders of intestine: Secondary | ICD-10-CM | POA: Diagnosis not present

## 2021-07-01 DIAGNOSIS — K449 Diaphragmatic hernia without obstruction or gangrene: Secondary | ICD-10-CM | POA: Diagnosis not present

## 2021-07-01 LAB — POCT I-STAT CREATININE: Creatinine, Ser: 0.9 mg/dL (ref 0.61–1.24)

## 2021-07-01 IMAGING — CT CT CTA ABD/PEL W/CM AND/OR W/O CM
2 of 9 series · 11 of 46 positions shown, 17 images · IV contrast (APPLIED)
Comparison: None.

CLINICAL DATA: 75-year-old male with mesenteric ischemia

EXAM:
CTA ABDOMEN AND PELVIS WITHOUT AND WITH CONTRAST
TECHNIQUE: Multidetector CT imaging of the abdomen and pelvis was performed
using the standard protocol during bolus administration of
intravenous contrast. Multiplanar reconstructed images and MIPs were
obtained and reviewed to evaluate the vascular anatomy.
CONTRAST:  80mL OMNIPAQUE IOHEXOL 350 MG/ML SOLN

[Series 7: coronals · coronal · 0.74mm/px · 2 of 158 slices shown]
[im 53/158  soft-tissue]
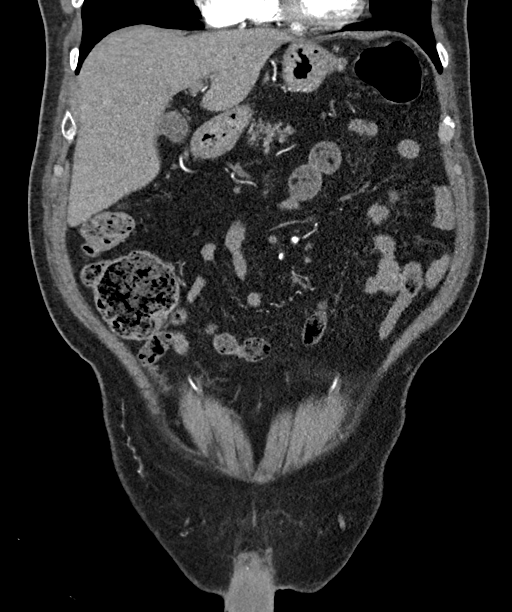
[im 105/158  soft-tissue]
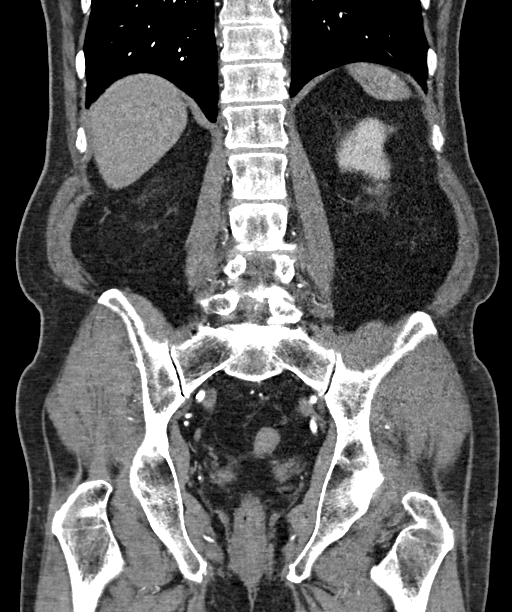

[Series 9: venous 5.0 i30f 1 · axial · portal-venous · 0.79mm/px · z∈[+747,+1107]mm · 9 of 91 slices shown, 15 images]
[im 10/91  soft-tissue]
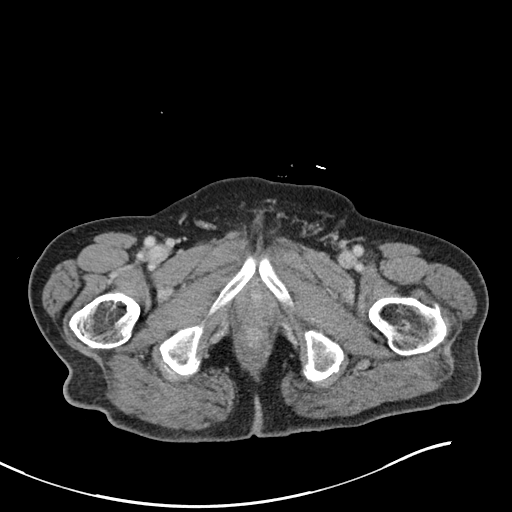
[im 10/91  bone]
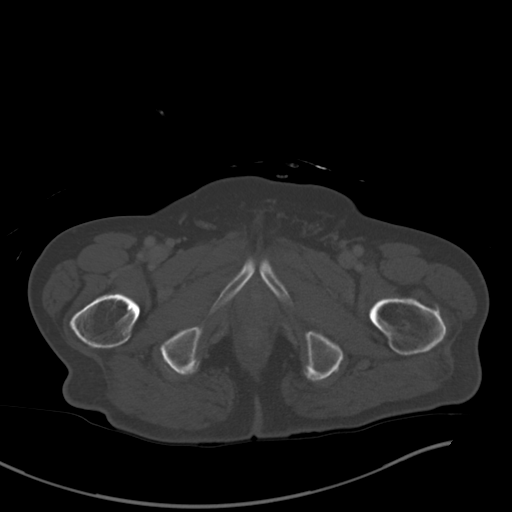
[im 19/91  soft-tissue]
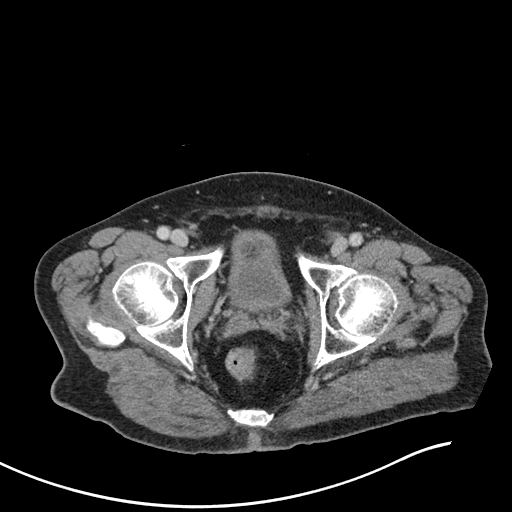
[im 28/91  soft-tissue]
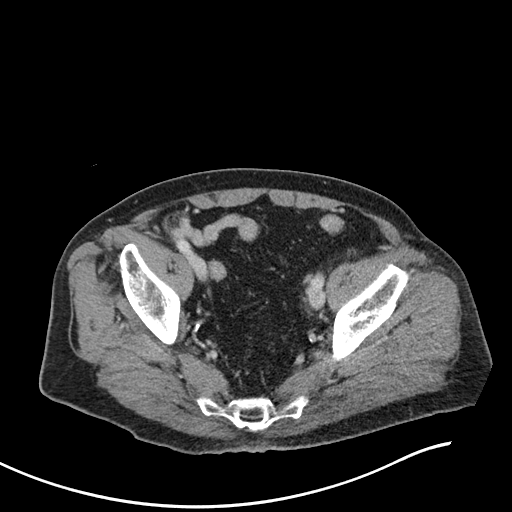
[im 37/91  soft-tissue]
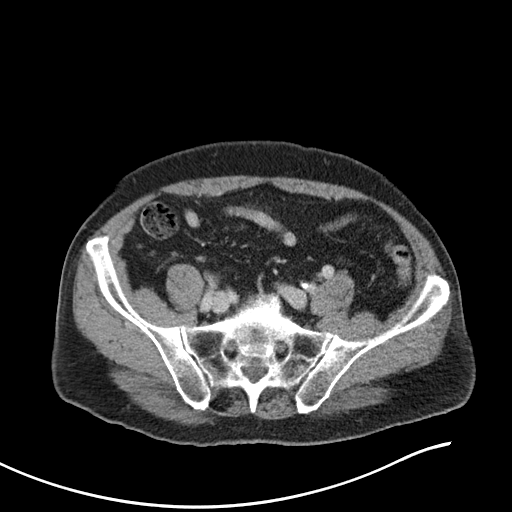
[im 46/91  soft-tissue]
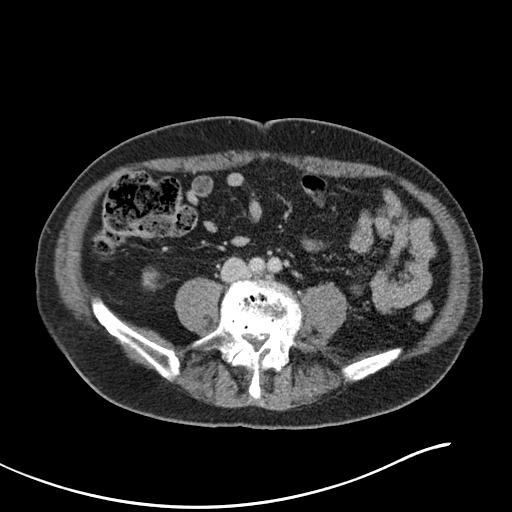
[im 55/91  soft-tissue]
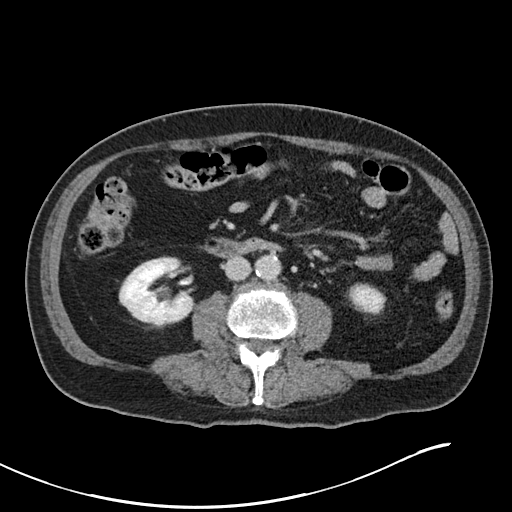
[im 55/91  lung]
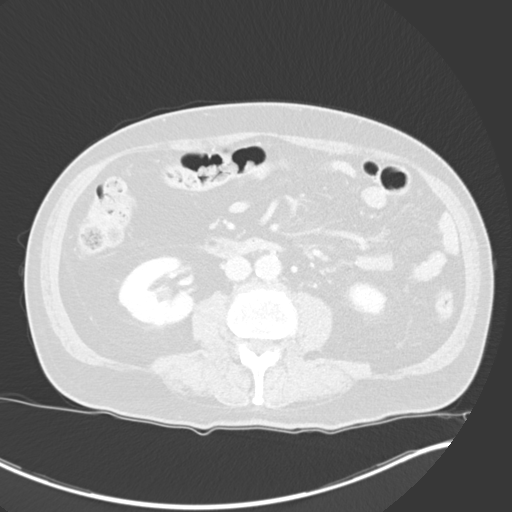
[im 64/91  soft-tissue]
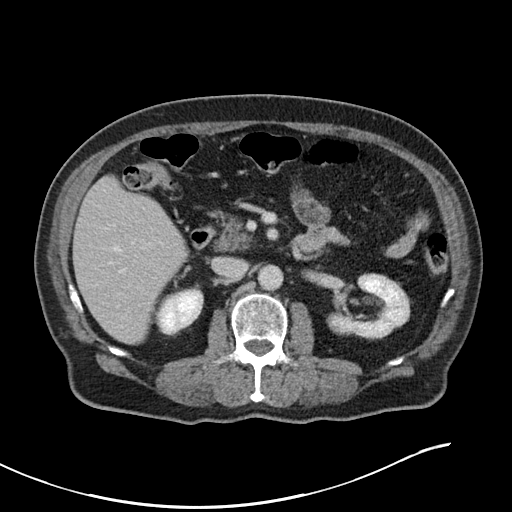
[im 64/91  lung]
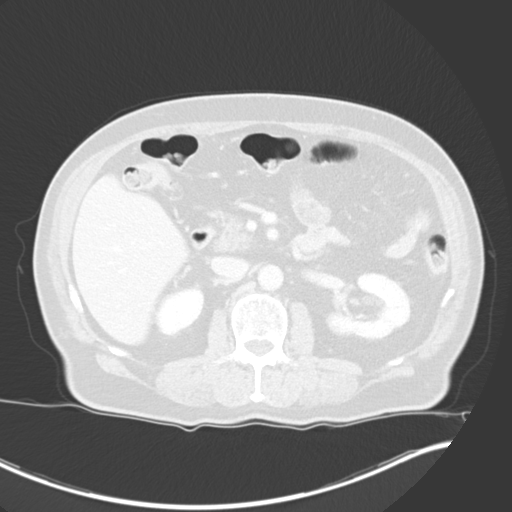
[im 73/91  soft-tissue]
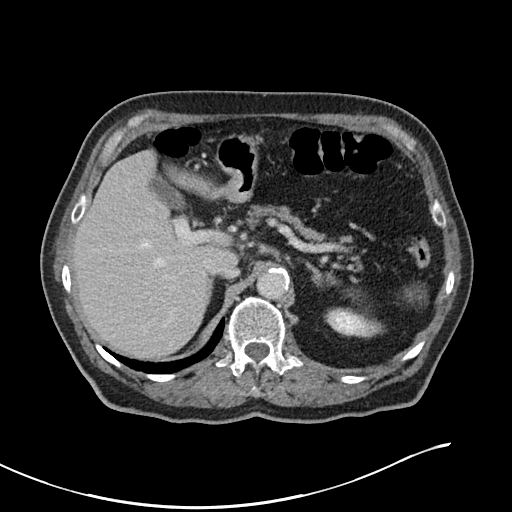
[im 73/91  lung]
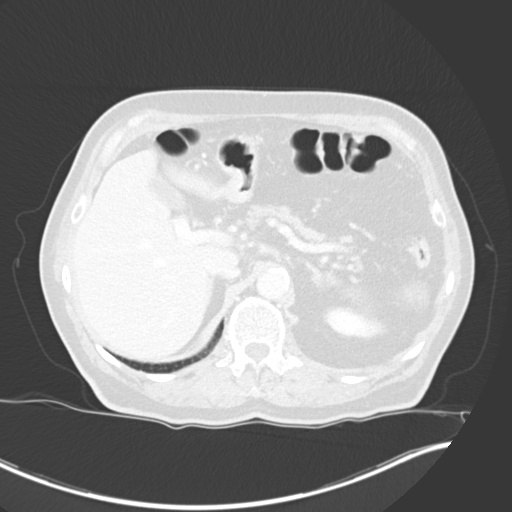
[im 82/91  soft-tissue]
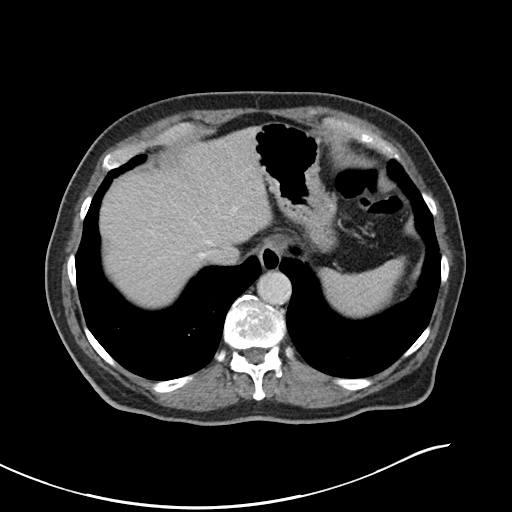
[im 82/91  lung]
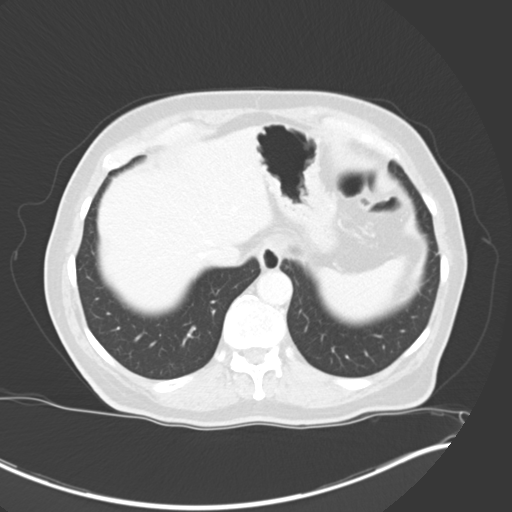
[im 82/91  bone]
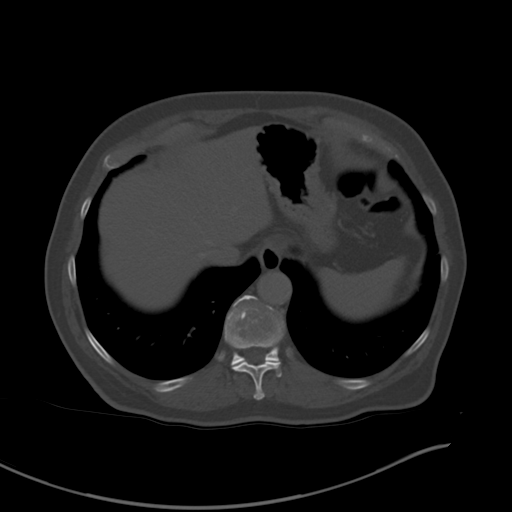

[11 of 46 positions shown; findings below may reference images not displayed]

FINDINGS: VASCULAR

Aorta: Estimated diameter at the aortic hiatus, 2.2 cm.

Mild atherosclerotic changes of the abdominal aorta. No dissection
or aneurysm. No periaortic fluid.

Celiac: Celiac artery patent with dense calcifications at the
origin. The sagittal reformatted images suggest no more than 50%
narrowing at the origin. Branches are patent

SMA: Atherosclerotic changes at the origin of the superior
mesenteric artery. The parasagittal images demonstrate no greater
than 50% narrowing at the origin. Branches are patent.

Renals:

- Right: Main right renal artery with minimal atherosclerosis at the
origin. Normal course caliber and contour. Accessory right renal
artery from the 11 o'clock position below the main renal artery, to
the lower pole segment.

- Left: Mild atherosclerosis of the main left renal artery origin.
Normal course caliber and contour. Accessory left renal artery of
small caliber arising above the main renal artery, to the superior
segment/capsule.

IMA: IMA is patent with dense calcifications at the origin, likely
stenotic though difficult to quantify.

Right lower extremity:

Unremarkable caliber, and contour of the right iliac system. Mild
tortuosity. Minimal atherosclerosis. No aneurysm, dissection, or
occlusion. Hypogastric artery is patent. Common femoral artery
patent. Proximal SFA and profunda femoris patent.

Left lower extremity:

Unremarkable caliber, and contour of the left iliac system. Mild
tortuosity. Minimal atherosclerosis. No aneurysm, dissection, or
occlusion. Hypogastric artery is patent. Common femoral artery
patent. Proximal SFA and profunda femoris patent.

Veins: Unremarkable appearance of the venous system.

Review of the MIP images confirms the above findings.

NON-VASCULAR

Lower chest: No acute.  Coronary artery disease.

Hepatobiliary: Unremarkable appearance of the liver. Unremarkable
gall bladder.

Pancreas: Unremarkable.

Spleen: Unremarkable.

Adrenals/Urinary Tract:

- Right adrenal gland: Unremarkable

- Left adrenal gland: Unremarkable.

- Right kidney: No hydronephrosis, nephrolithiasis, inflammation, or
ureteral dilation. No focal lesion.

- Left Kidney: No hydronephrosis, nephrolithiasis, inflammation, or
ureteral dilation. No focal lesion.

- Urinary Bladder: Unremarkable.

Stomach/Bowel:

- Stomach: Small hiatal hernia.  Otherwise unremarkable stomach.

- Small bowel: Unremarkable

- Appendix: Normal.

- Colon: Small stool burden. Left-sided colonic diverticula with no
inflammatory changes.

Lymphatic: No adenopathy.

Mesenteric: No free fluid or air. No mesenteric adenopathy.

Reproductive: Transverse diameter of the prostate measures 46 mm

Other: No hernia.

Musculoskeletal: Multilevel degenerative changes of the visualized
thoracolumbar spine. Vacuum disc phenomenon spanning L3-S1. 50% bony
canal narrowing at the L5-S1 level. No acute displaced fracture.
IMPRESSION: Mesenteric arterial disease, with patency of all 3 vessels and
estimated 50% narrowing at the celiac artery and SMA origin on the
sagittal images. The IMA is likely stenotic though quantifying is
difficult given the presence calcium and small caliber vessel.

Aortic atherosclerosis and coronary artery disease. Aortic
Atherosclerosis ([VZ]-[VZ]).

Mild bilateral renal arterial disease.

Additional ancillary findings as above.

## 2021-07-01 MED ORDER — IOHEXOL 350 MG/ML SOLN
80.0000 mL | Freq: Once | INTRAVENOUS | Status: AC | PRN
  Administered 2021-07-01: 80 mL via INTRAVENOUS

## 2021-07-19 ENCOUNTER — Other Ambulatory Visit: Payer: Self-pay

## 2021-07-19 ENCOUNTER — Emergency Department (HOSPITAL_BASED_OUTPATIENT_CLINIC_OR_DEPARTMENT_OTHER): Payer: Medicare PPO

## 2021-07-19 ENCOUNTER — Emergency Department (HOSPITAL_BASED_OUTPATIENT_CLINIC_OR_DEPARTMENT_OTHER)
Admission: EM | Admit: 2021-07-19 | Discharge: 2021-07-19 | Disposition: A | Discharge status: Home / Self Care | Payer: Medicare PPO | Attending: Emergency Medicine | Admitting: Emergency Medicine

## 2021-07-19 ENCOUNTER — Encounter (HOSPITAL_BASED_OUTPATIENT_CLINIC_OR_DEPARTMENT_OTHER): Payer: Self-pay | Admitting: Obstetrics and Gynecology

## 2021-07-19 DIAGNOSIS — F17290 Nicotine dependence, other tobacco product, uncomplicated: Secondary | ICD-10-CM | POA: Diagnosis not present

## 2021-07-19 DIAGNOSIS — G20 Parkinson's disease: Secondary | ICD-10-CM | POA: Diagnosis not present

## 2021-07-19 DIAGNOSIS — Z79899 Other long term (current) drug therapy: Secondary | ICD-10-CM | POA: Insufficient documentation

## 2021-07-19 DIAGNOSIS — K449 Diaphragmatic hernia without obstruction or gangrene: Secondary | ICD-10-CM | POA: Diagnosis not present

## 2021-07-19 DIAGNOSIS — R109 Unspecified abdominal pain: Secondary | ICD-10-CM | POA: Diagnosis not present

## 2021-07-19 DIAGNOSIS — I10 Essential (primary) hypertension: Secondary | ICD-10-CM | POA: Insufficient documentation

## 2021-07-19 DIAGNOSIS — R1084 Generalized abdominal pain: Secondary | ICD-10-CM | POA: Diagnosis not present

## 2021-07-19 DIAGNOSIS — Z7982 Long term (current) use of aspirin: Secondary | ICD-10-CM | POA: Insufficient documentation

## 2021-07-19 LAB — CBC WITH DIFFERENTIAL/PLATELET
Abs Immature Granulocytes: 0.02 10*3/uL (ref 0.00–0.07)
Basophils Absolute: 0 10*3/uL (ref 0.0–0.1)
Basophils Relative: 0 %
Eosinophils Absolute: 0.1 10*3/uL (ref 0.0–0.5)
Eosinophils Relative: 1 %
HCT: 38.4 % — ABNORMAL LOW (ref 39.0–52.0)
Hemoglobin: 12.8 g/dL — ABNORMAL LOW (ref 13.0–17.0)
Immature Granulocytes: 0 %
Lymphocytes Relative: 27 %
Lymphs Abs: 2.5 10*3/uL (ref 0.7–4.0)
MCH: 28.9 pg (ref 26.0–34.0)
MCHC: 33.3 g/dL (ref 30.0–36.0)
MCV: 86.7 fL (ref 80.0–100.0)
Monocytes Absolute: 0.7 10*3/uL (ref 0.1–1.0)
Monocytes Relative: 8 %
Neutro Abs: 5.9 10*3/uL (ref 1.7–7.7)
Neutrophils Relative %: 64 %
Platelets: 205 10*3/uL (ref 150–400)
RBC: 4.43 MIL/uL (ref 4.22–5.81)
RDW: 13.8 % (ref 11.5–15.5)
WBC: 9.3 10*3/uL (ref 4.0–10.5)
nRBC: 0 % (ref 0.0–0.2)

## 2021-07-19 LAB — COMPREHENSIVE METABOLIC PANEL
ALT: <5 U/L (ref 0–44)
AST: 8 U/L — ABNORMAL LOW (ref 15–41)
Albumin: 4.3 g/dL (ref 3.5–5.0)
Alkaline Phosphatase: 55 U/L (ref 38–126)
Anion gap: 7 (ref 5–15)
BUN: 18 mg/dL (ref 8–23)
CO2: 30 mmol/L (ref 22–32)
Calcium: 8.9 mg/dL (ref 8.9–10.3)
Chloride: 102 mmol/L (ref 98–111)
Creatinine, Ser: 1.03 mg/dL (ref 0.61–1.24)
GFR, Estimated: >60 mL/min (ref >60)
Glucose, Bld: 103 mg/dL — ABNORMAL HIGH (ref 70–99)
Potassium: 4.2 mmol/L (ref 3.5–5.1)
Sodium: 139 mmol/L (ref 135–145)
Total Bilirubin: 0.8 mg/dL (ref 0.3–1.2)
Total Protein: 6.7 g/dL (ref 6.5–8.1)

## 2021-07-19 LAB — TROPONIN I (HIGH SENSITIVITY): Troponin I (High Sensitivity): <2 ng/L (ref <18)

## 2021-07-19 IMAGING — CT CT CTA ABD/PEL W/CM AND/OR W/O CM
3 of 9 series · 11 of 46 positions shown, 17 images · IV contrast (APPLIED)
Comparison: [DATE]

CLINICAL DATA: 75-year-old male with a history of acute mesenteric
ischemia

EXAM:
CTA ABDOMEN AND PELVIS WITHOUT AND WITH CONTRAST
TECHNIQUE: Multidetector CT imaging of the abdomen and pelvis was performed
using the standard protocol during bolus administration of
intravenous contrast. Multiplanar reconstructed images and MIPs were
obtained and reviewed to evaluate the vascular anatomy.
CONTRAST:  100mL OMNIPAQUE IOHEXOL 350 MG/ML SOLN

[Series 4: arterial · axial · arterial · 0.77mm/px · z∈[+821,+917]mm · 3 of 217 slices shown]
[im 25/217  soft-tissue]
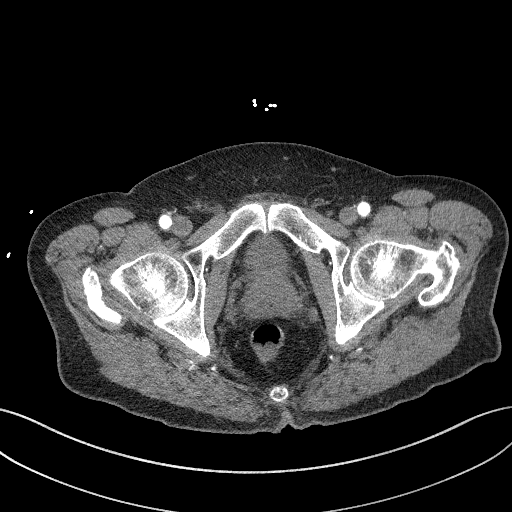
[im 49/217  soft-tissue]
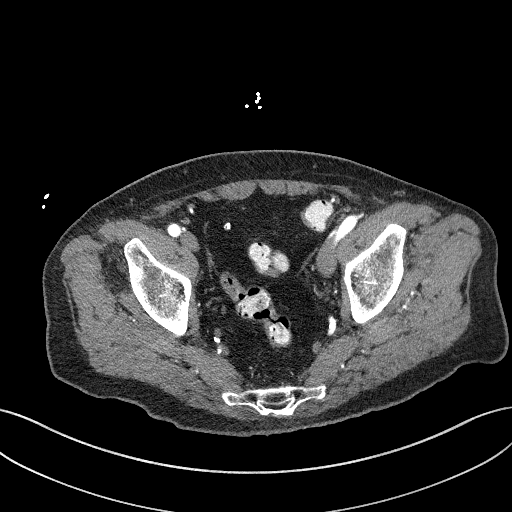
[im 73/217  soft-tissue]
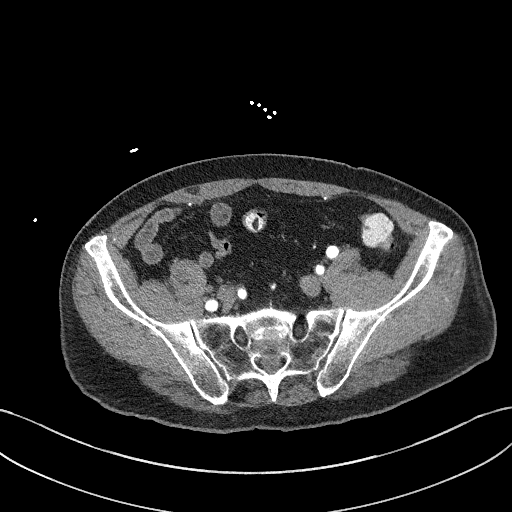

[Series 7: cor art · coronal · 0.80mm/px · 2 of 143 slices shown, 3 images]
[im 48/143  soft-tissue]
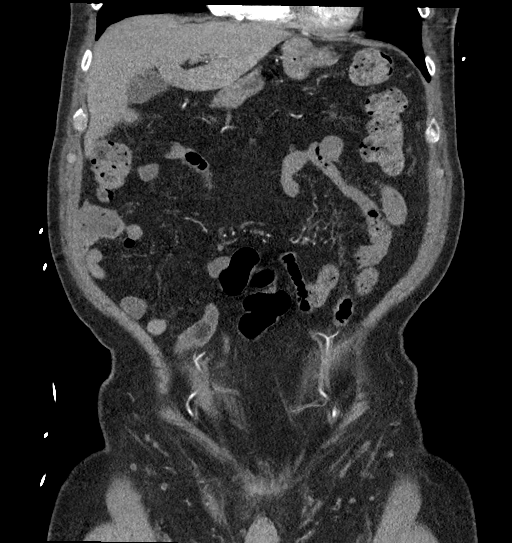
[im 48/143  bone]
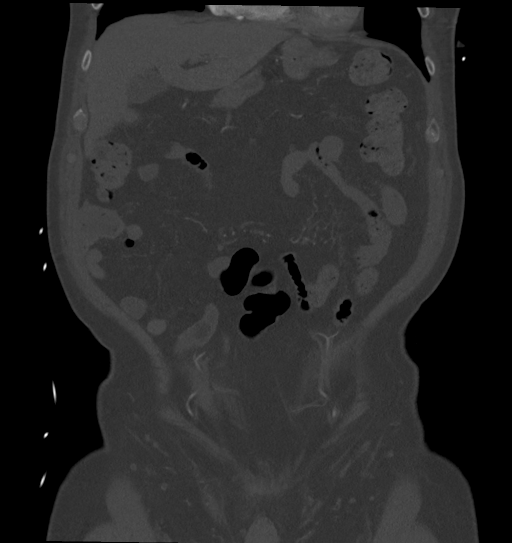
[im 95/143  soft-tissue]
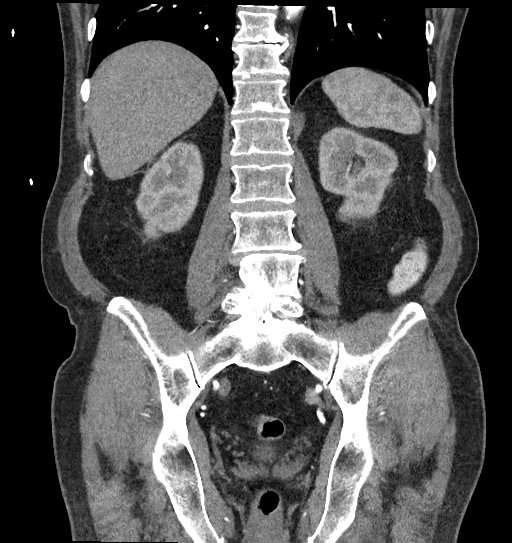

[Series 11: portal venous · axial · portal-venous · 0.77mm/px · z∈[+835,+1140]mm · 6 of 87 slices shown, 11 images]
[im 13/87  soft-tissue]
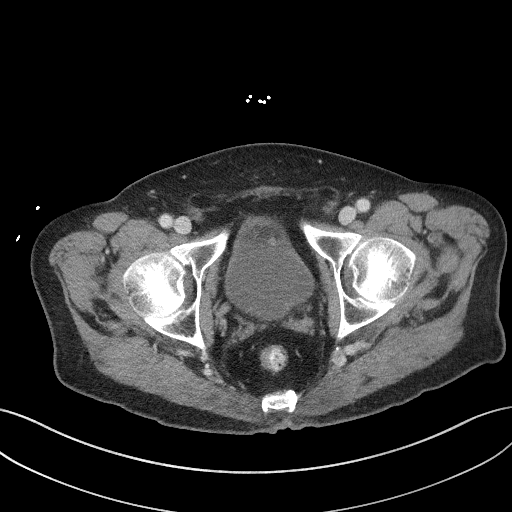
[im 13/87  bone]
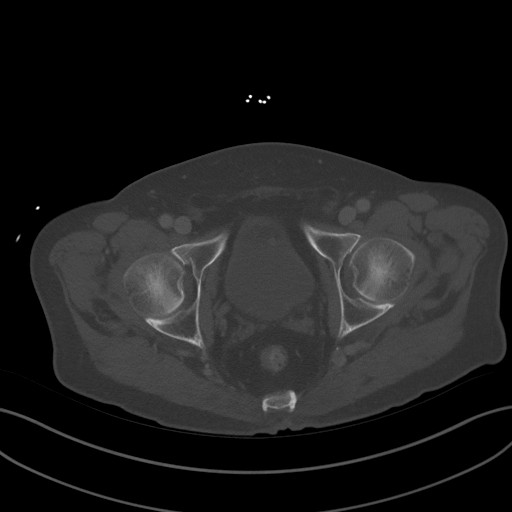
[im 25/87  soft-tissue]
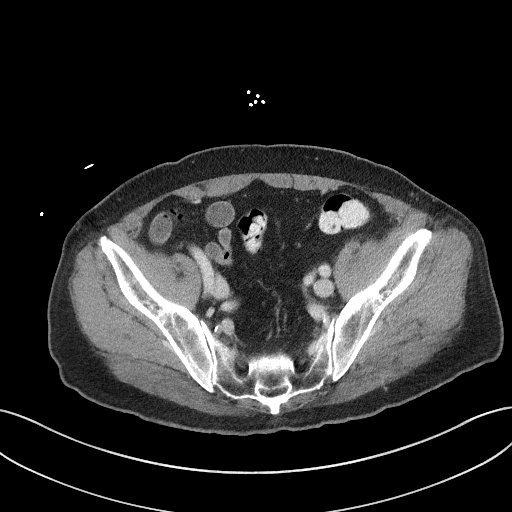
[im 37/87  soft-tissue]
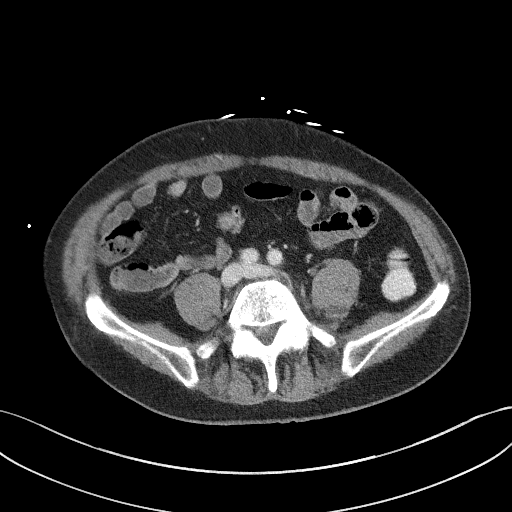
[im 37/87  lung]
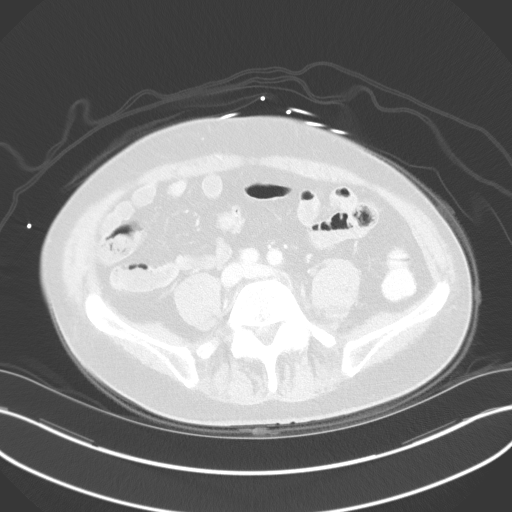
[im 50/87  soft-tissue]
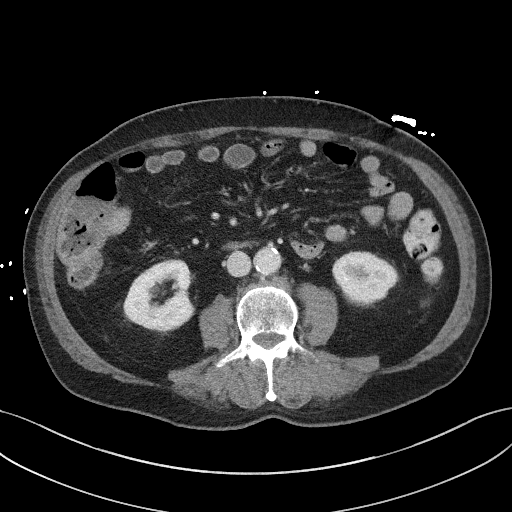
[im 50/87  lung]
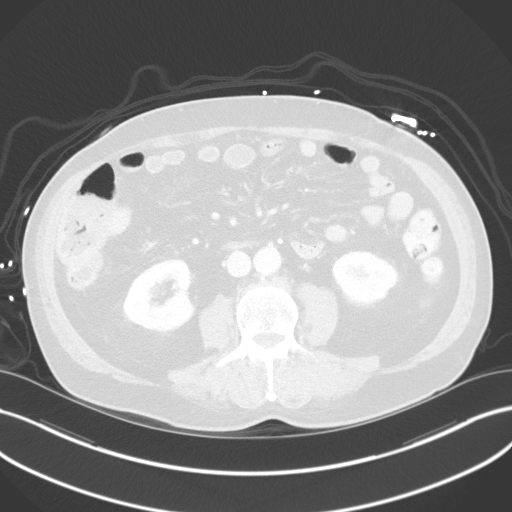
[im 62/87  soft-tissue]
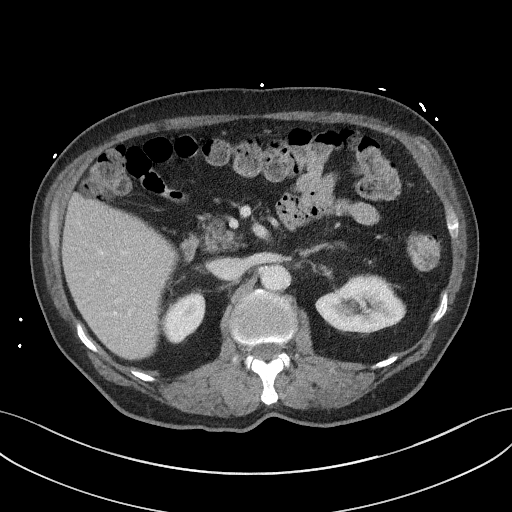
[im 62/87  lung]
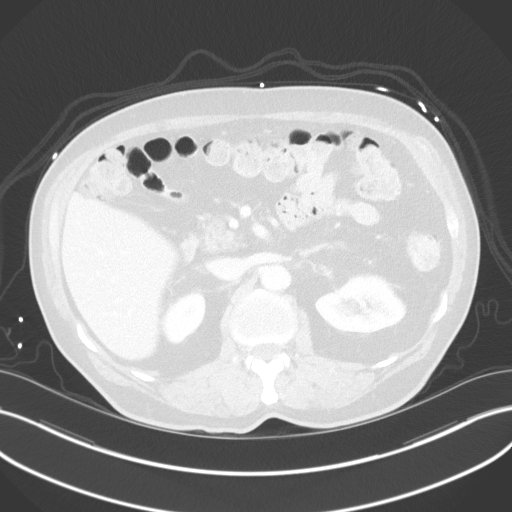
[im 74/87  soft-tissue]
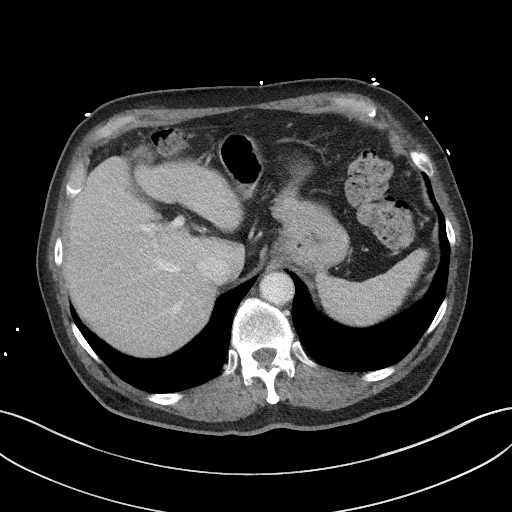
[im 74/87  lung]
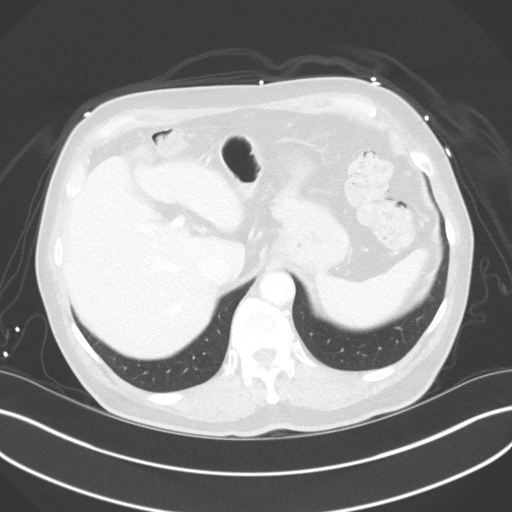

[11 of 46 positions shown; findings below may reference images not displayed]

FINDINGS: VASCULAR

Aorta: Mild atherosclerotic changes of the abdominal aorta. No wall
thickening, periaortic fluid, dissection. No infrarenal abdominal
aortic aneurysm.

Celiac: Celiac artery patent. Mild calcified atherosclerotic plaque
at the origin with no greater than 50% narrowing at the origin.
Branches patent.

SMA: Atherosclerotic changes at the origin of the SMA. No greater
than 50% narrowing at the origin. Branches are patent.

Renals:

- Right: Main right renal artery patent with minimal atherosclerotic
changes. Accessory right renal artery of small caliber is patent to
the lower segment.

- Left: Main left renal artery with mild to moderate atherosclerotic
changes at the origin and no high-grade stenosis. Small accessory
left renal artery to the upper pole segment.

IMA: IMA patent with atherosclerotic changes at the origin,
estimated 50% or greater narrowing.

Right lower extremity:

Mild atherosclerosis of the right iliac system without high-grade
stenosis. Mild tortuosity. No aneurysm, dissection, or occlusion.
Hypogastric artery is patent. Common femoral artery patent. Proximal
SFA and profunda femoris patent.

Left lower extremity:

Mild atherosclerosis of the left iliac system. No high-grade
stenosis or occlusion. Tortuosity. No aneurysm, dissection, or
occlusion. Hypogastric artery is patent. Common femoral artery
patent. Proximal SFA and profunda femoris patent.

Veins: Unremarkable appearance of the venous system.

Review of the MIP images confirms the above findings.

NON-VASCULAR

Lower chest: No acute.

Hepatobiliary: Unremarkable appearance of the liver. Unremarkable
gall bladder.

Pancreas: Unremarkable.

Spleen: Unremarkable.

Adrenals/Urinary Tract:

- Right adrenal gland: Unremarkable

- Left adrenal gland: Unremarkable.

- Right kidney: No hydronephrosis, nephrolithiasis, inflammation, or
ureteral dilation. No focal lesion.

- Left Kidney: No hydronephrosis, nephrolithiasis, inflammation, or
ureteral dilation. No focal lesion.

- Urinary Bladder: Unremarkable.

Stomach/Bowel:

- Stomach: Small hiatal hernia with otherwise unremarkable stomach.

- Small bowel: Unremarkable

- Appendix: Normal.

- Colon: Unremarkable.

Lymphatic: No adenopathy.

Mesenteric: No free fluid or air. No mesenteric adenopathy.

Reproductive: Transverse diameter of the prostate 44 mm.

Other: No hernia.

Musculoskeletal: Degenerative changes of the visualized spine. 50%
bony canal narrowing at the L5-S1 level. No acute displaced
fracture. Vacuum disc phenomenon spans L3-S1.
IMPRESSION: No acute CT finding.

Aortic and mild mesenteric arterial disease, with the CT angiogram
negative for any findings that would support acute mesenteric
ischemia. Aortic Atherosclerosis ([I9]-[I9]).

Mild renal arterial disease.

Additional ancillary findings as above.

## 2021-07-19 MED ORDER — IOHEXOL 350 MG/ML SOLN
100.0000 mL | Freq: Once | INTRAVENOUS | Status: AC | PRN
  Administered 2021-07-19: 14:00:00 100 mL via INTRAVENOUS

## 2021-07-19 NOTE — ED Triage Notes (Signed)
Patient reports to the ER for hypotension. Patient reports his BP was 87/42 at home. Patient reports he normally has high blood pressure. Patient reports he has parkinson's and they have been adjusting his medications to try and decrease the chance of him bottoming out. Patient reports he was recently hospitalized with carotid artery surgery and he has a blood flow problem to the mesentery.

## 2021-07-19 NOTE — ED Provider Notes (Signed)
[facility] EMERGENCY DEPT Provider Note   CSN: [id number] Arrival date & time: 07/19/21  1150     History Chief Complaint  Patient presents with   Hypotension    Terry [name] is a 75 y.o. male.  Patient is a 75 yo male with pmh of mesenteric artery disease presenting for abdominal pain. Patient admits to episode of severe generalized abdominal pain, radiating to the back, that began this morning and lasted several hours. Denies pain at this time. Admits to feeling light headed and episode of hypotension that occurred. Denies fevers, chills, nausea, or vomiting. Denies dysruia or hematuria.   The history is provided by the patient. No language interpreter was used.      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/2021   Carotid artery occlusion 06/14/2021   Stroke ([place]) 04/24/2021   Headache 04/24/2021   Hypertensive crisis 04/23/2021   Chest tightness 10/09/2019   Shortness of breath 10/09/2019   Erectile dysfunction 03/12/2019   Essential hypertension 04/29/2018   Need for immunization against influenza 04/29/2018   Parkinson's disease ([place]) 04/29/2018   Carotid artery stenosis 04/29/2018   HLD (hyperlipidemia) 04/29/2018   GERD (gastroesophageal reflux disease) 04/29/2018   Colon cancer screening 04/29/2018   Chronic tension headaches 04/29/2018   Murmur, cardiac 02/12/2018   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   Pure hypercholesterolemia 04/28/2016    Past Surgical History:  Procedure Laterality Date    ENDARTERECTOMY Left 04/27/2021   Procedure: LEFT CAROTID ENDARTERECTOMY;  Surgeon: Serafina Mitchell, MD;  Location: [facility] OR;  Service: Vascular;  Laterality: Left;   LEFT HEART CATH AND CORONARY ANGIOGRAPHY N/A 11/25/2019   Procedure: LEFT HEART CATH AND CORONARY ANGIOGRAPHY;  Surgeon: Belva Crome, MD;  Location: [facility];  Service: Cardiovascular;  Laterality: N/A;   PATCH ANGIOPLASTY Left 04/27/2021   Procedure: PATCH ANGIOPLASTY USING [employer]ue[employer] Bash BIOLOGIC PATCH;  Surgeon: Serafina Mitchell, MD;  Location: [facility] OR;  Service: Vascular;  Laterality: Left;   TONSILLECTOMY         Family History  Problem Relation Age of Onset   Heart disease Mother    Cancer Father     Social History   Tobacco Use   Smoking status: Never   Smokeless tobacco: Current    Types: Snuff  Vaping Use   Vaping Use: Never used  Substance Use Topics   Alcohol use: Yes    Comment: Occasionally.   Drug use: Never    Home Medications Prior to Admission medications   Medication Sig Start Date End Date Taking? Authorizing Provider  acetaminophen (TYLENOL) 500 MG tablet Take 500 mg by mouth every 6 (six) hours as needed for moderate pain or headache.   Yes [provider]  amLODipine (NORVASC) 10 MG tablet Take 1 tablet (10 mg total) by mouth daily. Patient taking differently: Take 5 mg by mouth in the morning and at bedtime. 11/18/19 07/19/21 Yes O'Neal, Cassie Freer, MD  aspirin EC 81 MG EC tablet Take 1 tablet (81 mg total) by mouth daily. Swallow whole. 04/29/21  Yes Pahwani, [place],  MD  atorvastatin (LIPITOR) 20 MG tablet TAKE ONE TABLET BY MOUTH EVERY NIGHT AT BEDTIME Patient taking differently: Take 20 mg by mouth every evening. 05/19/20  Yes [place], Carley D, DO  carbidopa-levodopa (SINEMET CR) 50-200 MG tablet Take 1 tablet by mouth at bedtime. 07/29/18  Yes Lanelle Bal, MD  carbidopa-levodopa (SINEMET IR) 25-100 MG tablet Take 2.5 tablets by mouth 4 (four) times daily. 7am, 12pm,  5pm, 10pm   Yes [provider]  cetirizine (ZYRTEC) 10 MG tablet Take 10 mg by mouth as needed for allergies.    Yes [provider]  fluticasone (FLONASE) 50 MCG/ACT nasal spray Place 1 spray into both nostrils daily as needed for allergies.    Yes [provider]  meloxicam (MOBIC) 15 MG tablet Take 15 mg by mouth at bedtime as needed for pain. 11/03/19  Yes [provider]  NEUPRO 4 MG/24HR Place 1 patch onto the skin daily. 04/19/21  Yes [provider]  omeprazole (PRILOSEC) 40 MG capsule Take 1 capsule (40 mg total) by mouth daily. 04/13/20  Yes Christian, Rylee, MD  tiZANidine (ZANAFLEX) 2 MG tablet Take 2 mg by mouth daily as needed for muscle spasms. 04/18/21  Yes [provider]  traZODone (DESYREL) 50 MG tablet Take 1 tablet (50 mg total) by mouth at bedtime as needed for sleep. 09/03/18  Yes Lanelle Bal, MD  valsartan (DIOVAN) 80 MG tablet Take 80 mg by mouth daily. 03/23/21  Yes [provider]  nitroGLYCERIN (NITROSTAT) 0.4 MG SL tablet Place 1 tablet (0.4 mg total) under the tongue every 5 (five) minutes as needed for chest pain. 10/16/19 04/23/21  O'NealRonnald Ramp, MD    Allergies    Penicillins, Erythromycin, and Clam shell  Review of Systems   Review of Systems  Constitutional:  Negative for chills and fever.  HENT:  Negative for ear pain and sore throat.   Eyes:  Negative for pain and visual disturbance.  Respiratory:  Negative for cough and shortness of breath.   Cardiovascular:  Negative for chest pain and palpitations.  Gastrointestinal:  Positive for abdominal pain. Negative for vomiting.  Genitourinary:  Negative for dysuria and hematuria.  Musculoskeletal:  Negative for arthralgias and back pain.  Skin:  Negative for color change and rash.  Neurological:  Negative for seizures and syncope.  All other systems reviewed and are negative.  Physical Exam Updated Vital Signs BP (!) 191/97 (BP  Location: Left Arm)    Pulse 71    Temp 97.6 F (36.4 C) (Oral)    Resp 17    SpO2 100%   Physical Exam Vitals and nursing note reviewed.  Constitutional:      General: He is not in acute distress.    Appearance: He is well-developed.  HENT:     Head: Normocephalic and atraumatic.  Eyes:     Conjunctiva/sclera: Conjunctivae normal.  Cardiovascular:     Rate and Rhythm: Normal rate and regular rhythm.     Pulses:          Radial pulses are 2+ on the right side and 2+ on the left side.       Dorsalis pedis pulses are 2+ on the right side and 2+ on the left side.     Heart sounds: No murmur heard. Pulmonary:     Effort: Pulmonary effort is normal. No respiratory distress.     Breath sounds: Normal breath sounds.  Abdominal:     Palpations: Abdomen is soft.  Tenderness: There is no abdominal tenderness.  Musculoskeletal:        General: No swelling.     Cervical back: Neck supple.  Skin:    General: Skin is warm and dry.     Capillary Refill: Capillary refill takes less than 2 seconds.  Neurological:     Mental Status: He is alert.  Psychiatric:        Mood and Affect: Mood normal.    ED Results / Procedures / Treatments   Labs (all labs ordered are listed, but only abnormal results are displayed) Labs Reviewed  COMPREHENSIVE METABOLIC PANEL - Abnormal; Notable for the following components:      Result Value   Glucose, Bld 103 (*)    AST 8 (*)    All other components within normal limits  CBC WITH DIFFERENTIAL/PLATELET - Abnormal; Notable for the following components:   Hemoglobin 12.8 (*)    HCT 38.4 (*)    All other components within normal limits  TROPONIN I (HIGH SENSITIVITY)    EKG EKG Interpretation  Date/Time:  Tuesday July 19 2021 13:52:12 EST Ventricular Rate:  58 PR Interval:  247 QRS Duration: 100 QT Interval:  420 QTC Calculation: 413 R Axis:   63 Text Interpretation: Sinus rhythm Prolonged PR interval Confirmed by Sherwood Gambler [phone])  on 07/20/2021 8:27:08 AM  Radiology CT Angio Abd/Pel W and/or Wo Contrast  Result Date: 07/19/2021 CLINICAL DATA:  75 year old male with a history of acute mesenteric ischemia EXAM: CTA ABDOMEN AND PELVIS WITHOUT AND WITH CONTRAST TECHNIQUE: Multidetector CT imaging of the abdomen and pelvis was performed using the standard protocol during bolus administration of intravenous contrast. Multiplanar reconstructed images and MIPs were obtained and reviewed to evaluate the vascular anatomy. CONTRAST:  112mL OMNIPAQUE IOHEXOL 350 MG/ML SOLN COMPARISON:  07/01/2021 FINDINGS: VASCULAR Aorta: Mild atherosclerotic changes of the abdominal aorta. No wall thickening, periaortic fluid, dissection. No infrarenal abdominal aortic aneurysm. Celiac: Celiac artery patent. Mild calcified atherosclerotic plaque at the origin with no greater than 50% narrowing at the origin. Branches patent. SMA: Atherosclerotic changes at the origin of the SMA. No greater than 50% narrowing at the origin. Branches are patent. Renals: - Right: Main right renal artery patent with minimal atherosclerotic changes. Accessory right renal artery of small caliber is patent to the lower segment. - Left: Main left renal artery with mild to moderate atherosclerotic changes at the origin and no high-grade stenosis. Small accessory left renal artery to the upper pole segment. IMA: IMA patent with atherosclerotic changes at the origin, estimated 50% or greater narrowing. Right lower extremity: Mild atherosclerosis of the right iliac system without high-grade stenosis. Mild tortuosity. No aneurysm, dissection, or occlusion. Hypogastric artery is patent. Common femoral artery patent. Proximal SFA and profunda femoris patent. Left lower extremity: Mild atherosclerosis of the left iliac system. No high-grade stenosis or occlusion. Tortuosity. No aneurysm, dissection, or occlusion. Hypogastric artery is patent. Common femoral artery patent. Proximal SFA and  profunda femoris patent. Veins: Unremarkable appearance of the venous system. Review of the MIP images confirms the above findings. NON-VASCULAR Lower chest: No acute. Hepatobiliary: Unremarkable appearance of the liver. Unremarkable gall bladder. Pancreas: Unremarkable. Spleen: Unremarkable. Adrenals/Urinary Tract: - Right adrenal gland: Unremarkable - Left adrenal gland: Unremarkable. - Right kidney: No hydronephrosis, nephrolithiasis, inflammation, or ureteral dilation. No focal lesion. - Left Kidney: No hydronephrosis, nephrolithiasis, inflammation, or ureteral dilation. No focal lesion. - Urinary Bladder: Unremarkable. Stomach/Bowel: - Stomach: Small hiatal hernia with otherwise unremarkable stomach. - Small bowel:  Unremarkable - Appendix: Normal. - Colon: Unremarkable. Lymphatic: No adenopathy. Mesenteric: No free fluid or air. No mesenteric adenopathy. Reproductive: Transverse diameter of the prostate 44 mm. Other: No hernia. Musculoskeletal: Degenerative changes of the visualized spine. 50% bony canal narrowing at the L5-S1 level. No acute displaced fracture. Vacuum disc phenomenon spans L3-S1. IMPRESSION: No acute CT finding. Aortic and mild mesenteric arterial disease, with the CT angiogram negative for any findings that would support acute mesenteric ischemia. Aortic Atherosclerosis (ICD10-I70.0). Mild renal arterial disease. Additional ancillary findings as above. Signed, Dulcy Fanny. Dellia Nims, RPVI [facility] Electronically Signed   By: Corrie Mckusick D.O.   On: 07/19/2021 15:02    Procedures Procedures   Medications Ordered in ED Medications  iohexol (OMNIPAQUE) 350 MG/ML injection 100 mL (100 mLs Intravenous Contrast Given 07/19/21 1409)    ED Course  I have reviewed the triage vital signs and the nursing notes.  Pertinent labs & imaging results that were available during my care of the patient were reviewed by me and considered in  my medical decision making (see chart for details).    MDM Rules/Calculators/A&P                         7:24 PM  75 yo male with pmh of mesenteric artery disease presenting for abdominal pain. Pt is Aox3, no acute distress, febrile, with stable vitals. Abdomen soft and non tender at this time. Equal bilateral dorsalis pulses.   ECG demonstrates no acute process. No STEMI. ACS workup up remarkable.  CTA abdomen demonstrates no significant mesenteric artery changes. No acute process. Stable liver profile, lipase, and renal function.  On re-eval patient remains pain free.   Patient in no distress and overall condition improved here in the ED. Detailed discussions were had with the patient regarding current findings, and need for close f/u with PCP or GI specialist. The patient has been instructed to return immediately if the symptoms worsen in any way for re-evaluation. Patient verbalized understanding and is in agreement with current care plan. All questions answered prior to discharge.    Final Clinical Impression(s) / ED Diagnoses Final diagnoses:  Abdominal pain, unspecified abdominal location    Rx / DC Orders ED Discharge Orders     None        Lianne Cure, DO [phone]

## 2021-07-21 ENCOUNTER — Ambulatory Visit (INDEPENDENT_AMBULATORY_CARE_PROVIDER_SITE_OTHER): Payer: Medicare PPO | Admitting: Otolaryngology

## 2021-07-22 ENCOUNTER — Other Ambulatory Visit: Payer: Self-pay

## 2021-07-22 DIAGNOSIS — I10 Essential (primary) hypertension: Secondary | ICD-10-CM | POA: Diagnosis not present

## 2021-07-22 DIAGNOSIS — G903 Multi-system degeneration of the autonomic nervous system: Secondary | ICD-10-CM | POA: Diagnosis not present

## 2021-07-22 DIAGNOSIS — E782 Mixed hyperlipidemia: Secondary | ICD-10-CM | POA: Diagnosis not present

## 2021-07-22 DIAGNOSIS — I6529 Occlusion and stenosis of unspecified carotid artery: Secondary | ICD-10-CM | POA: Diagnosis not present

## 2021-07-25 NOTE — Progress Notes (Signed)
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 He presents to our office today as referred by Dr. Trula Slade for further evaluation regarding early satiety, weight loss and lower abdominal pain. He developed lower back pain early Oct. 2022 during his hospital admission 10/1 - 04/28/2021 with a subacute ischemic CVA and s/p right carotid endarterectomy. His lower back pain started to radiated to his central lower abdomen which occurs daily since then. He awakens in the morning without noticeable lower abdominal pain. He sits up at the side of the bed and shortly after his lower abdominal pain starts. He tries to stand up  but his lower abdominal pain is so intense he sits back back down and lays back in bed for about 10 minutes. He sits up on the bedside and pushes on the the side of his night stand and forces himself to stand up. He walks to the bathroom, walks through the pain then sits down in his rocking chair near the fireplace. He eats breakfast and his abdominal pain significantly improves and is nearly gone by the end of the day. However, he goes to bed at night and awakens in the morning and the abdominal pain cycle recurs. No associated N/V.  He has heartburn once or twice daily for the past 8 weeks.  No dysphagia.  He reports losing 30 to 35 lbs over the past 8 weeks but recently gained back about 7 or 8 lbs. No fevers, sweats or chills.  He has constipation.  He is passing a bowel movement every 3 to 4 days for the past 2 months.  He previously passed a normal formed brown bowel movement on a daily basis.  No rectal bleeding or melena.  He reported undergoing a colonoscopy in  2004 in [place] which showed a few polyps which were removed.  A repeat colonoscopy in 2007 in [place] was reported as normal, no polyps.  He is on ASA 81mg  daily and he takes Meloxicam as needed.  He was evaluated by vascular surgeon Dr. Trula Slade on 06/27/2021. A mesenteric ultrasound showed the following: Velocities in the proximal SMA are in the >70% stenosis range.  Velocities in the proximal IMA are in the >70% stenosis range.   Dr. Trula Slade discussed with the patient that the mesenteric ultrasound identified mesenteric stenosis. However, Dr. Trula Slade did not assess his abdominal pain was secondary to mesenteric ischemia.  An abdominal/pelvic CTA was ordered to further evaluate his vasculature.  He subsequently developed severe generalized abdominal pain and he presented to the ED 07/19/2021 for further evaluation. A 12 lead EKG without acute ischemia.  Troponin level < 2. CTA abdomen/pelvics without significant mesenteric artery changes. No acute process. He was hemodynamically stable and he was discharged home.  No further episodes of severe abdominal pain and is being discharged from the ED.  He reported having 4 episodes of near syncope over the past 4 weeks.  The first episode occurred approximately 4 weeks ago in the morning while he was home alone.  At that time, he ate breakfast and walked down the hall towards the bedroom and he passed out on the ground briefly.  The other 3 episodes were witnessed by his wife and occurred  typically when he was walking in the kitchen or in the hallway, he felt weak and called out and he was able to sit on the ground without falling during these episodes.  No associated chest pain or shortness of breath but he did recall having some dizziness.  CBC Latest Ref Rng & Units 07/19/2021 04/28/2021 04/27/2021  WBC 4.0 - 10.5 K/uL 9.3 13.9(H) 14.1(H)  Hemoglobin 13.0 - 17.0 g/dL 12.8(L) 12.2(L) 13.3  Hematocrit 39.0 - 52.0 % 38.4(L) 35.3(L) 39.8  Platelets 150 -  400 K/uL 205 171 180  MCV 86.7 CMP Latest Ref Rng & Units 07/19/2021 07/01/2021 04/28/2021  Glucose 70 - 99 mg/dL 103(H) - 120(H)  BUN 8 - 23 mg/dL 18 - 21  Creatinine 0.61 - 1.24 mg/dL 1.03 0.90 0.86  Sodium 135 - 145 mmol/L 139 - 135  Potassium 3.5 - 5.1 mmol/L 4.2 - 4.1  Chloride 98 - 111 mmol/L 102 - 104  CO2 22 - 32 mmol/L 30 - 22  Calcium 8.9 - 10.3 mg/dL 8.9 - 8.5(L)  Total Protein 6.5 - 8.1 g/dL 6.7 - -  Total Bilirubin 0.3 - 1.2 mg/dL 0.8 - -  Alkaline Phos 38 - 126 U/L 55 - -  AST 15 - 41 U/L 8(L) - -  ALT 0 - 44 U/L <5 - -    Abd/Pelvic CTA w/wo 07/19/2021: TECHNIQUE: Multidetector CT imaging of the abdomen and pelvis was performed using the standard protocol during bolus administration of intravenous contrast. Multiplanar reconstructed images and MIPs were obtained and reviewed to evaluate the vascular anatomy.   CONTRAST:  131mL OMNIPAQUE IOHEXOL 350 MG/ML SOLN   COMPARISON:  07/01/2021   FINDINGS: VASCULAR   Aorta: Mild atherosclerotic changes of the abdominal aorta. No wall thickening, periaortic fluid, dissection. No infrarenal abdominal aortic aneurysm.   Celiac: Celiac artery patent. Mild calcified atherosclerotic plaque at the origin with no greater than 50% narrowing at the origin. Branches patent.   SMA: Atherosclerotic changes at the origin of the SMA. No greater than 50% narrowing at the origin. Branches are patent.   Renals:   - Right: Main right renal artery patent with minimal atherosclerotic changes. Accessory right renal artery of small caliber is patent to the lower segment.   - Left: Main left renal artery with mild to moderate atherosclerotic changes at the origin and no high-grade stenosis. Small accessory left renal artery to the upper pole segment.   IMA: IMA patent with atherosclerotic changes at the origin, estimated 50% or greater narrowing.   Right lower extremity:   Mild atherosclerosis of the right iliac system without  high-grade stenosis. Mild tortuosity. No aneurysm, dissection, or occlusion. Hypogastric artery is patent. Common femoral artery patent. Proximal SFA and profunda femoris patent.   Left lower extremity:   Mild atherosclerosis of the left iliac system. No high-grade stenosis or occlusion. Tortuosity. No aneurysm, dissection, or occlusion. Hypogastric artery is patent. Common femoral artery patent. Proximal SFA and profunda femoris patent.   Veins: Unremarkable appearance of the venous system.   Review of the MIP images confirms the above findings.   NON-VASCULAR   Lower chest: No acute.   Hepatobiliary: Unremarkable appearance of the liver. Unremarkable gall bladder.   Pancreas: Unremarkable.   Spleen: Unremarkable.   Adrenals/Urinary Tract:   - Right adrenal gland: Unremarkable   - Left adrenal gland: Unremarkable.   - Right kidney: No hydronephrosis, nephrolithiasis, inflammation, or ureteral dilation. No focal lesion.   - Left Kidney: No hydronephrosis, nephrolithiasis, inflammation, or  ureteral dilation. No focal lesion.   - Urinary Bladder: Unremarkable.   Stomach/Bowel:   - Stomach: Small hiatal hernia with otherwise unremarkable stomach.   - Small bowel: Unremarkable   - Appendix: Normal.   - Colon: Unremarkable.   Lymphatic: No adenopathy.   Mesenteric: No free fluid or air. No mesenteric adenopathy.   Reproductive: Transverse diameter of the prostate 44 mm.   Other: No hernia.   Musculoskeletal: Degenerative changes of the visualized spine. 50% bony canal narrowing at the L5-S1 level. No acute displaced fracture. Vacuum disc phenomenon spans L3-S1.   IMPRESSION: No acute CT finding.   Aortic and mild mesenteric arterial disease, with the CT angiogram negative for any findings that would support acute mesenteric ischemia. Aortic Atherosclerosis (ICD10-I70.0).   Mild renal arterial disease.  ECHO 04/24/2021: Left ventricular ejection  fraction, by estimation, is 65 to 70%. The left ventricle has normal function. The left ventricle has no regional wall motion abnormalities. There is moderate left ventricular hypertrophy. Left ventricular diastolic parameters are indeterminate. 1. 2. Right ventricular systolic function is normal. The right ventricular size is normal. 3. Left atrial size was mildly dilated. 4. Right atrial size was mildly dilated. 5. The mitral valve is grossly normal. No evidence of mitral valve regurgitation. 6. The aortic valve is tricuspid. Aortic valve regurgitation is not visualized.  Past Medical History:  Diagnosis Date   Carotid artery stenosis 04/29/2018   Chronic tension headaches 04/29/2018   Essential hypertension 04/29/2018   GERD (gastroesophageal reflux disease) 04/29/2018   HLD (hyperlipidemia) 04/29/2018   Parkinson's disease ([place]) 04/29/2018   Past Surgical History:  Procedure Laterality Date   ENDARTERECTOMY Left 04/27/2021   Procedure: LEFT CAROTID ENDARTERECTOMY;  Surgeon: Serafina Mitchell, MD;  Location: [facility] OR;  Service: Vascular;  Laterality: Left;   LEFT HEART CATH AND CORONARY ANGIOGRAPHY N/A 11/25/2019   Procedure: LEFT HEART CATH AND CORONARY ANGIOGRAPHY;  Surgeon: Belva Crome, MD;  Location: [facility];  Service: Cardiovascular;  Laterality: N/A;   PATCH ANGIOPLASTY Left 04/27/2021   Procedure: PATCH ANGIOPLASTY USING Rueben Bash BIOLOGIC PATCH;  Surgeon: Serafina Mitchell, MD;  Location: [facility] OR;  Service: Vascular;  Laterality: Left;   TONSILLECTOMY     Social History: He is married.  He is retired.  Non-smoker.  Rare alcohol intake.  Family History: Father had lung cancer.  Mother with history of heart disease.  Nephew with esophageal cancer    Allergies  Allergen Reactions   Penicillins Rash   Erythromycin Rash   Clam Shell Nausea And Vomiting     Outpatient Encounter Medications as of 07/26/2021  Medication Sig   acetaminophen (TYLENOL) 500 MG tablet Take 500 mg  by mouth every 6 (six) hours as needed for moderate pain or headache.   amLODipine (NORVASC) 10 MG tablet Take 1 tablet (10 mg total) by mouth daily. (Patient taking differently: Take 5 mg by mouth in the morning and at bedtime.)   amLODipine (NORVASC) 2.5 MG tablet Take 2.5 mg by mouth daily.   aspirin EC 81 MG EC tablet Take 1 tablet (81 mg total) by mouth daily. Swallow whole.   atorvastatin (LIPITOR) 20 MG tablet TAKE ONE TABLET BY MOUTH EVERY NIGHT AT BEDTIME (Patient taking differently: Take 20 mg by mouth every evening.)   carbidopa-levodopa (SINEMET CR) 50-200 MG tablet Take 1 tablet by mouth at bedtime.   carbidopa-levodopa (SINEMET IR) 25-100 MG tablet Take 2.5 tablets by mouth 4 (four) times daily. 7am, 12pm, 5pm, 10pm  cetirizine (ZYRTEC) 10 MG tablet Take 10 mg by mouth as needed for allergies.    fluticasone (FLONASE) 50 MCG/ACT nasal spray Place 1 spray into both nostrils daily as needed for allergies.    meloxicam (MOBIC) 15 MG tablet Take 15 mg by mouth at bedtime as needed for pain.   NEUPRO 4 MG/24HR Place 1 patch onto the skin daily.   nitroGLYCERIN (NITROSTAT) 0.4 MG SL tablet Place 1 tablet (0.4 mg total) under the tongue every 5 (five) minutes as needed for chest pain.   omeprazole (PRILOSEC) 40 MG capsule Take 1 capsule (40 mg total) by mouth daily.   tiZANidine (ZANAFLEX) 2 MG tablet Take 2 mg by mouth daily as needed for muscle spasms.   traZODone (DESYREL) 50 MG tablet Take 1 tablet (50 mg total) by mouth at bedtime as needed for sleep.   valsartan (DIOVAN) 80 MG tablet Take 80 mg by mouth daily.   No facility-administered encounter medications on file as of 07/26/2021.    REVIEW OF SYSTEMS:  Gen: See HPI. + Fatigue.  CV: Denies chest pain, palpitations or edema. Resp: Denies cough, shortness of breath of hemoptysis.  GI: See HPI.  GU : Denies urinary burning, blood in urine, increased urinary frequency or incontinence. MS:+ Lower back pain.  Derm: Denies rash,  itchiness, skin lesions or unhealing ulcers. Psych: Denies depression, anxiety, memory loss, suicidal ideation and confusion. Heme: Denies bruising, bleeding. Neuro:  Denies headaches, dizziness or paresthesias. Endo:  Denies any problems with DM, thyroid or adrenal function.  PHYSICAL EXAM: BP 130/60    Pulse 74    Ht 6' (1.829 m)    Wt 168 lb (76.2 kg)    SpO2 97%    BMI 22.78 kg/m   General: 76 year old male in no acute distress. Head: Normocephalic and atraumatic. Eyes:  Sclerae non-icteric, conjunctive pink. Ears: Normal auditory acuity. Mouth: No ulcers or lesions.  Neck: Supple, no lymphadenopathy or thyromegaly.  Lungs: Clear bilaterally to auscultation without wheezes, crackles or rhonchi. Heart: Regular rate and rhythm. No murmur, rub or gallop appreciated.  Abdomen: Soft, non distended.  Nontender.  No masses. No hepatosplenomegaly. Normoactive bowel sounds x 4 quadrants.  Rectal: Deferred.  Musculoskeletal: Symmetrical with no gross deformities. Skin: Warm and dry. No rash or lesions on visible extremities. Extremities: No edema. Neurological: Alert oriented x 4, no focal deficits.  Psychological:  Alert and cooperative. Normal mood and affect.  ASSESSMENT AND PLAN:  37) 76 year old male with GERD symptoms, early satiety, weight loss. Ab/pelvic CTA 07/19/2021 without evidence of intra abdominal/pelvic mass. -Pantoprazole 40mg  QD -Eventual EGD if symptoms persist, he will require cardiac clearance if EGD pursued  -Ensure or Boost 1 bottle daily, diet as tolerated   2) Lower abdominal pain secondary to constipation (Parkinson's disease likely contributing to constipation). No evidence of mesenteric ischemia per abd/pelvic CTA.  -Miralax Q HS to increase stool output -He will require cardiac clearance if a colonoscopy is pursued  -Tylenol PRN  3) Normocytic anemia  -CBC, iron, iron saturation, TIBC, ferritin level, B12 and folate level  -EGD/colonoscopy if the above  labs show iron deficiency   4) Mesenteric stenosis per ultrasound, no evidence of mesenteric ischemia per abd/pelvic CTA  5) Four near syncopal episodes x 4 weeks  -Follow up with cardiology regarding syncopal episodes. He will require cardiac clearance if future endoscopic evaluation pursued.   6) S/P subacute CVA, 04/23/2021, S/P right endarterectomy 04/27/2021  7) Lower back pain -Consider lumbar/sacral spine MRI,  defer to PCP  8) Patient reported history of colon polyps in 2004, last colonoscopy in 2007 reported as normal, no polyps.  No known family history of colorectal cancer.  I will consult with Dr. Rush Landmark prior to scheduling any endoscopic evaluation as the patient is at high risk for procedure/sedation complications due to his multiple comorbidities and recent CVA/endarterectomy surgery.  Due to his recurrent near syncopal episodes, he will require a cardiac clearance prior to any future endoscopic evaluation as noted above.    CC:  Merrilee Seashore, MD

## 2021-07-26 ENCOUNTER — Other Ambulatory Visit (INDEPENDENT_AMBULATORY_CARE_PROVIDER_SITE_OTHER): Payer: Medicare Other

## 2021-07-26 ENCOUNTER — Encounter: Payer: Self-pay | Admitting: Nurse Practitioner

## 2021-07-26 ENCOUNTER — Ambulatory Visit: Payer: Medicare Other | Admitting: Nurse Practitioner

## 2021-07-26 VITALS — BP 130/60 | HR 74 | Ht 72.0 in | Wt 168.0 lb

## 2021-07-26 DIAGNOSIS — D649 Anemia, unspecified: Secondary | ICD-10-CM

## 2021-07-26 DIAGNOSIS — K5900 Constipation, unspecified: Secondary | ICD-10-CM

## 2021-07-26 DIAGNOSIS — R1030 Lower abdominal pain, unspecified: Secondary | ICD-10-CM

## 2021-07-26 LAB — CBC WITH DIFFERENTIAL/PLATELET
Basophils Absolute: 0 10*3/uL (ref 0.0–0.1)
Basophils Relative: 0.4 % (ref 0.0–3.0)
Eosinophils Absolute: 0.1 10*3/uL (ref 0.0–0.7)
Eosinophils Relative: 0.7 % (ref 0.0–5.0)
HCT: 38.8 % — ABNORMAL LOW (ref 39.0–52.0)
Hemoglobin: 13 g/dL (ref 13.0–17.0)
Lymphocytes Relative: 20.4 % (ref 12.0–46.0)
Lymphs Abs: 1.6 10*3/uL (ref 0.7–4.0)
MCHC: 33.4 g/dL (ref 30.0–36.0)
MCV: 85.6 fl (ref 78.0–100.0)
Monocytes Absolute: 0.5 10*3/uL (ref 0.1–1.0)
Monocytes Relative: 6.2 % (ref 3.0–12.0)
Neutro Abs: 5.6 10*3/uL (ref 1.4–7.7)
Neutrophils Relative %: 72.3 % (ref 43.0–77.0)
Platelets: 202 10*3/uL (ref 150.0–400.0)
RBC: 4.53 Mil/uL (ref 4.22–5.81)
RDW: 13.9 % (ref 11.5–15.5)
WBC: 7.7 10*3/uL (ref 4.0–10.5)

## 2021-07-26 LAB — B12 AND FOLATE PANEL
Folate: 6.1 ng/mL (ref >5.9)
Vitamin B-12: 175 pg/mL — ABNORMAL LOW (ref 211–911)

## 2021-07-26 NOTE — Progress Notes (Signed)
Attending Physician's Attestation   I have reviewed the chart.   I agree with the Advanced Practitioner's note, impression, and recommendations with any updates as below.  Difficult situation overall and in the setting of his recent stroke makes endoscopic evaluation even higher risk.  With this being said, I do think that endoscopic evaluation from above and below is likely what we will end up needing to perform.  This would have to be done in the hospital-based setting.  If the patient has evidence of true anemia and iron deficiency this will give even more sense for [place] to be able to move forward with procedures.  However I agree that additional optimization will likely be required.  That will need to be done through his PCP/cardiology/neurology.  With his history of recent potential syncopal episodes cardiology will help guide this and probably repeat imaging of his brain may need to be performed as well.  Certainly appreciate the opportunity to try and rule some things out for the patient if we can but again he is an increased risk individual for issues.  May be reasonable for [place] to start looking for slots at the end of February or beginning of March to further evaluate symptoms (since we have such a wait for inpatients in the hospital-based setting for procedures) I will ask my team to tentatively find a slot for the patient while further work-up is being pursued.  I will get additional information after patient's laboratories returned and we will discuss things further with NP Kennedy-Smith.   Justice Britain, MD [facility] Office # [phone]

## 2021-07-26 NOTE — Patient Instructions (Addendum)
LABS:  Lab work has been ordered for you today. Our lab is located in the basement. Press "B" on the elevator. The lab is located at the first door on the left as you exit the elevator.  HEALTHCARE LAWS AND MY CHART RESULTS: Due to recent changes in healthcare laws, you may see the results of your imaging and laboratory studies on MyChart before your provider has had a chance to review them.   We understand that in some cases there may be results that are confusing or concerning to you. Not all laboratory results come back in the same time frame and the provider may be waiting for multiple results in order to interpret others.  Please give [place] 48 hours in order for your provider to thoroughly review all the results before contacting the office for clarification of your results.   RECOMMENDATIONS: Miralax- Dissolve one capful in 8 ounces of water and drink before bed to increase stool output. Follow up with your cardiologist regarding near syncope episodes and to obtain cardiac clearance prior to scheduling any procedures.  It was great seeing you today! Thank you for entrusting me with your care and choosing [facility].  Arnaldo Natal, CRNP  The [facility] providers would like to encourage you to use [facility] to communicate with providers for non-urgent requests or questions.  Due to long hold times on the telephone, sending your provider a message by [facility] may be faster and more efficient way to get a response. Please allow 48 business hours for a response.  Please remember that this is for non-urgent requests/questions.  If you are age 42 or older, your body mass index should be between 23-30. Your Body mass index is 22.78 kg/m. If this is out of the aforementioned range listed, please consider follow up with your Primary Care Provider.  If you are age 63 or younger, your body mass index should be between 19-25. Your Body mass index is 22.78 kg/m. If this is out of the  aformentioned range listed, please consider follow up with your Primary Care Provider.

## 2021-07-27 ENCOUNTER — Telehealth: Payer: Self-pay

## 2021-07-27 LAB — IRON,TIBC AND FERRITIN PANEL
%SAT: 22 % (calc) (ref 20–48)
Ferritin: 63 ng/mL (ref 24–380)
Iron: 66 ug/dL (ref 50–180)
TIBC: 295 mcg/dL (calc) (ref 250–425)

## 2021-07-27 NOTE — Telephone Encounter (Signed)
I spoke with the pt and he will come in for the appt on 2/15 to discuss possible hospital case.

## 2021-07-27 NOTE — Telephone Encounter (Signed)
I will call and have a hold placed on 3/2 at 830 am at [facility].

## 2021-07-27 NOTE — Telephone Encounter (Signed)
°  I have reviewed the chart.    I agree with the Advanced Practitioner's note, impression, and recommendations with any updates as below.   Difficult situation overall and in the setting of his recent stroke makes endoscopic evaluation even higher risk.  With this being said, I do think that endoscopic evaluation from above and below is likely what we will end up needing to perform.  This would have to be done in the hospital-based setting.  If the patient has evidence of true anemia and iron deficiency this will give even more sense for [place] to be able to move forward with procedures.  However I agree that additional optimization will likely be required.  That will need to be done through his PCP/cardiology/neurology.  With his history of recent potential syncopal episodes cardiology will help guide this and probably repeat imaging of his brain may need to be performed as well.  Certainly appreciate the opportunity to try and rule some things out for the patient if we can but again he is an increased risk individual for issues.  May be reasonable for [place] to start looking for slots at the end of February or beginning of March to further evaluate symptoms (since we have such a wait for inpatients in the hospital-based setting for procedures) I will ask my team to tentatively find a slot for the patient while further work-up is being pursued.  I will get additional information after patient's laboratories returned and we will discuss things further with NP Kennedy-Smith.     Corliss Parish, MD [facility] Office # [phone]

## 2021-07-27 NOTE — Telephone Encounter (Signed)
Appt made for the pt to see Dr Meridee Score on 09/07/21 at 2:10 pm.

## 2021-07-27 NOTE — Telephone Encounter (Signed)
-----   Message from Lemar Lofty., MD sent at 07/26/2021  5:25 PM EST -----    ----- Message ----- From: Arnaldo Natal, NP Sent: 07/26/2021   4:39 PM EST To: Lemar Lofty., MD  Dr. Meridee Score, a lot to sort through with Mr. Kirkendall, I will forward his lab results to you when received. I will await your input/recommendations. THX

## 2021-07-28 NOTE — Progress Notes (Signed)
See lab note with Dr. Elesa Hacker recommendations as follows:  Patty,  Thanks for holding that spot.  I think that is the right call.  Why do not we work on having the patient return to clinic with CKS or myself at least 3 weeks before the procedure date so that we can finalize a plan of action based on how the patient is doing.  Hopefully by that time, he will also have cardiology approval/optimization.  Thanks.  GM  Patient has an office appt with Dr. Meridee Score on 09/07/2021 and he will determine at that time if patient ok to proceed with any endoscopic evaluation.

## 2021-07-29 ENCOUNTER — Ambulatory Visit (INDEPENDENT_AMBULATORY_CARE_PROVIDER_SITE_OTHER): Payer: Medicare Other

## 2021-07-29 ENCOUNTER — Ambulatory Visit: Payer: Medicare Other | Admitting: Adult Health

## 2021-07-29 ENCOUNTER — Other Ambulatory Visit: Payer: Self-pay | Admitting: Adult Health

## 2021-07-29 ENCOUNTER — Encounter: Payer: Self-pay | Admitting: Adult Health

## 2021-07-29 ENCOUNTER — Other Ambulatory Visit: Payer: Self-pay

## 2021-07-29 VITALS — BP 160/70 | HR 67 | Ht 72.0 in | Wt 171.4 lb

## 2021-07-29 DIAGNOSIS — R55 Syncope and collapse: Secondary | ICD-10-CM

## 2021-07-29 DIAGNOSIS — I6529 Occlusion and stenosis of unspecified carotid artery: Secondary | ICD-10-CM

## 2021-07-29 DIAGNOSIS — E7800 Pure hypercholesterolemia, unspecified: Secondary | ICD-10-CM

## 2021-07-29 DIAGNOSIS — R1031 Right lower quadrant pain: Secondary | ICD-10-CM

## 2021-07-29 DIAGNOSIS — K551 Chronic vascular disorders of intestine: Secondary | ICD-10-CM | POA: Diagnosis not present

## 2021-07-29 DIAGNOSIS — I10 Essential (primary) hypertension: Secondary | ICD-10-CM

## 2021-07-29 DIAGNOSIS — G20 Parkinson's disease: Secondary | ICD-10-CM | POA: Diagnosis not present

## 2021-07-29 NOTE — Progress Notes (Signed)
Cardiology Clinic Note   Patient Name: Terry [name] Date of Encounter: 07/29/2021  Primary Care Provider:  Hayden Rasmussen, MD Primary Cardiologist:  Terry Field, MD  Patient Profile  76 year old male with known history of nonobstructive CAD per cardiac catheterization 11/25/2019, echo on 11/11/2019 revealed EF of 65% to 70% with no regional wall motion abnormalities with moderate asymmetric LVH and grade 2 diastolic dysfunction with left atrial pressure elevation. He also has a history of a CVA and underwent left carotid endarterectomy on 04/27/2021 and started on Plavix.  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 Trula [name], with symptoms of abdominal pain and weight loss.  He is also due to follow with GI for in-hospital endoscopy.  Other history includes Parkinson's disease.  Past Medical History    Past Medical History:  Diagnosis Date   Carotid artery stenosis 04/29/2018   Chronic tension headaches 04/29/2018   Essential hypertension 04/29/2018   GERD (gastroesophageal reflux disease) 04/29/2018   HLD (hyperlipidemia) 04/29/2018   Parkinson's disease ([place]) 04/29/2018   Past Surgical History:  Procedure Laterality Date   ENDARTERECTOMY Left 04/27/2021   Procedure: LEFT CAROTID ENDARTERECTOMY;  Surgeon: Terry Mitchell, MD;  Location: [facility] OR;  Service: Vascular;  Laterality: Left;   LEFT HEART CATH AND CORONARY ANGIOGRAPHY N/A 11/25/2019   Procedure: LEFT HEART CATH AND CORONARY ANGIOGRAPHY;  Surgeon: Terry Crome, MD;  Location: [facility];  Service: Cardiovascular;  Laterality: N/A;   PATCH ANGIOPLASTY Left 04/27/2021   Procedure: PATCH ANGIOPLASTY USING Terry [name] BIOLOGIC PATCH;  Surgeon: Terry Mitchell, MD;  Location: [facility] OR;  Service: Vascular;  Laterality: Left;   TONSILLECTOMY      Allergies  Allergies  Allergen Reactions   Penicillins Rash   Erythromycin Rash   Clam Shell Nausea And Vomiting     History of Present Illness    Terry [name] is a 76 year old male with above-mentioned history who is been having frequent syncopal episodes.  He has a history of Parkinson's disease and has been seen by neurology for medication adjustments which could be causing the syncope related to positive orthostatic blood pressures.  He continues to have syncopal episodes which occur a minimum of 5 times a week usually very transient.  He often collapses and does not remember falling but wakens very quickly once he "lands", not remembering his fall.  He denies any aura, palpitations, or dizziness prior to this episode.  These are sudden and unexpected.  They can occur while standing or getting up from a chair, or with walking.  His wife has witnessed at least 5 of these episodes but he tells her that he has had more that he has not let her know about.  Because of the symptoms he is concerned about undergoing endoscopy without having cardiac evaluation.  Home Medications    Current Outpatient Medications  Medication Sig Dispense Refill   acetaminophen (TYLENOL) 500 MG tablet Take 500 mg by mouth every 6 (six) hours as needed for moderate pain or headache.     amLODipine (NORVASC) 2.5 MG tablet Take 2.5 mg by mouth daily.     aspirin EC 81 MG EC tablet Take 1 tablet (81 mg total) by mouth daily. Swallow whole. 30 tablet 1   atorvastatin (LIPITOR) 20 MG tablet TAKE ONE TABLET BY MOUTH EVERY NIGHT AT BEDTIME (Patient taking differently: Take 20 mg by mouth every evening.) 90 tablet 0   carbidopa-levodopa (SINEMET CR)  50-200 MG tablet Take 1 tablet by mouth at bedtime. 90 tablet 1   carbidopa-levodopa (SINEMET IR) 25-100 MG tablet Take 2.5 tablets by mouth 4 (four) times daily. 7am, 12pm, 5pm, 10pm     cetirizine (ZYRTEC) 10 MG tablet Take 10 mg by mouth as needed for allergies.      fluticasone (FLONASE) 50 MCG/ACT nasal spray Place 1 spray into both nostrils daily as needed for allergies.      meloxicam  (MOBIC) 15 MG tablet Take 15 mg by mouth at bedtime as needed for pain.     NEUPRO 4 MG/24HR Place 1 patch onto the skin daily.     pantoprazole (PROTONIX) 40 MG tablet Take 40 mg by mouth daily.     tiZANidine (ZANAFLEX) 2 MG tablet Take 2 mg by mouth daily as needed for muscle spasms.     traZODone (DESYREL) 50 MG tablet Take 1 tablet (50 mg total) by mouth at bedtime as needed for sleep. 90 tablet 0   valsartan (DIOVAN) 80 MG tablet Take 80 mg by mouth daily.     amLODipine (NORVASC) 10 MG tablet Take 1 tablet (10 mg total) by mouth daily. (Patient taking differently: Take 5 mg by mouth in the morning and at bedtime.) 90 tablet 2   nitroGLYCERIN (NITROSTAT) 0.4 MG SL tablet Place 1 tablet (0.4 mg total) under the tongue every 5 (five) minutes as needed for chest pain. 90 tablet 3   No current facility-administered medications for this visit.     Family History    Family History  Problem Relation Age of Onset   Heart disease Mother    Cancer Father    Colon cancer Neg Hx    Stomach cancer Neg Hx    He indicated that the status of his mother is unknown. He indicated that the status of his father is unknown. He indicated that the status of his neg hx is unknown.  Social History    Social History   Socioeconomic History   Marital status: Married    Spouse name: Not on file   Number of children: Not on file   Years of education: Not on file   Highest education level: Not on file  Occupational History   Not on file  Tobacco Use   Smoking status: Never   Smokeless tobacco: Current    Types: Snuff  Vaping Use   Vaping Use: Never used  Substance and Sexual Activity   Alcohol use: Yes    Comment: Occasionally.   Drug use: Never   Sexual activity: Not on file  Other Topics Concern   Not on file  Social History Narrative   Not on file   Social Determinants of Health   Financial Resource Strain: Not on file  Food Insecurity: Not on file  Transportation Needs: Not on file   Physical Activity: Not on file  Stress: Not on file  Social Connections: Not on file  Intimate Partner Violence: Not on file     Review of Systems    General:  No chills, fever, night sweats or weight changes.  Cardiovascular:  No chest pain, dyspnea on exertion, edema, orthopnea, palpitations, paroxysmal nocturnal dyspnea. Dermatological: No rash, lesions/masses Respiratory: No cough, dyspnea Urologic: No hematuria, dysuria Abdominal:   No nausea, vomiting, diarrhea, bright red blood per rectum, melena, or hematemesis Neurologic:  No visual changes, wkns, changes in mental status.  Frequent syncopal episodes All other systems reviewed and are otherwise negative except as noted above.  Physical Exam    VS:  BP (!) 160/70 (BP Location: Right Arm, Patient Position: Sitting)    Pulse 67    Ht 6' (1.829 m)    Wt 171 lb 6.4 oz (77.7 kg)    SpO2 98%    BMI 23.25 kg/m  , BMI Body mass index is 23.25 kg/m.     GEN: Well nourished, well developed, in no acute distress. HEENT: normal. Neck: Supple, no JVD, carotid bruits, or masses. Cardiac: RRR, no murmurs, rubs, or gallops. No clubbing, cyanosis, edema.  Radials/DP/PT 2+ and equal bilaterally.  Respiratory:  Respirations regular and unlabored, clear to auscultation bilaterally. GI: Soft, nontender, nondistended, BS + x 4. MS: no deformity or atrophy. Skin: warm and dry, no rash. Neuro:  Strength and sensation are intact.  Left hand essential tremor Psych: Normal affect.  Accessory Clinical Findings    ECG personally reviewed by me today-sinus rhythm with first-degree AV block, PR interval 218 ms, QRS 96 ms, QTC 409 ms, heart rate of 67 bpm, LVH is noted.- No acute changes  Lab Results  Component Value Date   WBC 7.7 07/26/2021   HGB 13.0 07/26/2021   HCT 38.8 (L) 07/26/2021   MCV 85.6 07/26/2021   PLT 202.0 07/26/2021   Lab Results  Component Value Date   CREATININE 1.03 07/19/2021   BUN 18 07/19/2021   NA 139  07/19/2021   K 4.2 07/19/2021   CL 102 07/19/2021   CO2 30 07/19/2021   Lab Results  Component Value Date   ALT <5 07/19/2021   AST 8 (L) 07/19/2021   ALKPHOS 55 07/19/2021   BILITOT 0.8 07/19/2021   Lab Results  Component Value Date   CHOL 78 04/28/2021   HDL 37 (L) 04/28/2021   LDLCALC 31 04/28/2021   TRIG 52 04/28/2021   CHOLHDL 2.1 04/28/2021    Lab Results  Component Value Date   HGBA1C 5.4 04/24/2021    Review of Prior Studies: Mesenteric Ultrasound: 06/27/2021 Mesenteric:     Velocities in the proximal SMA are in the >70% stenosis range.  Velocities in the proximal IMA are in the >70% stenosis range.    Echocardiogram: 04/24/2021 1. Left ventricular ejection fraction, by estimation, is 65 to 70%. The  left ventricle has normal function. The left ventricle has no regional  wall motion abnormalities. There is moderate left ventricular hypertrophy.  Left ventricular diastolic  parameters are indeterminate.   2. Right ventricular systolic function is normal. The right ventricular  size is normal.   3. Left atrial size was mildly dilated.   4. Right atrial size was mildly dilated.   5. The mitral valve is grossly normal. No evidence of mitral valve  regurgitation.   6. The aortic valve is tricuspid. Aortic valve regurgitation is not  visualized.   Left Heart Cath 11/24/2020  Normal left ventricular function with EF 65%.  Normal LVEDP. Normal left main. Luminal irregularities in the proximal to mid LAD less than 20%. Large ramus intermedius with luminal irregularities.  Widely patent. Dominant right coronary without evidence of obstruction or atherosclerosis.   Assessment & Plan   1.  Syncope: From a cardiac standpoint he may be having arrhythmias, bradycardia or pauses.  Although I feel the most likely cause could be orthostatic hypotension related to his Parkinson's disease and medication management.  However to be thorough I will place a 7-day ZIO cardiac  monitor real-time evaluation on this patient to rule out any cardiac arrhythmias or etiology  for his syncopal episodes.  I have discussed this with the patient and his wife who verbalized understanding and agree to have this monitor placed.  He will follow-up in 2 to 3 weeks with Dr.O'Neal.  2.  Hypertension: He remains on amlodipine 2.5 mg daily and valsartan 80 mg daily.  Blood pressure slightly elevated in the office today, at home they are recordings are 130s over 60s consistently.  I will not make any changes in his medication regimen based upon the blood pressure here in the office today to avoid worsening hypotension.  3.  Parkinson's disease: Being followed by neurology and remains on carbidopa-levodopa with medication adjustments per neurology.  He was positive for orthostatic hypotension on recent neurology office visit and therefore medications were decreased.  Defer to neurology for any further changes.  4.  Mesenteric artery disease: Most recent ultrasound revealed greater than 70% bilateral mesenteric artery disease.  He is being followed by Dr. Trula [name].  Defer to Dr. Trula [name] for intervention at his discretion.  The patient is having abdominal pain and has been losing weight.  He does not have postprandial abdominal pain however.  5.  Abdominal pain: GI would like to do an in-hospital endoscopy.  The patient and GI would like cardiac evaluation prior to undergoing this procedure.  Plans as above  6. Hyperlipidemia: Currently on atorvastatin 20 mg daily goal of LDL less than 70.  Most recent lipid profile completed on 04/28/2021, total cholesterol 78 triglycerides 52 HDL 37 LDL 31.  Current medicines are reviewed at length with the patient today.  I have spent 25 min's  dedicated to the care of this patient on the date of this encounter to include pre-visit review of records, assessment, management and diagnostic testing,with shared decision making.   Signed, Phill Myron. West Pugh, ANP,  AACC   07/29/2021 3:56 PM    [facility] [address] Office [phone] Fax [phone]  Notice: This dictation was prepared with Dragon dictation along with smaller phrase technology. Any transcriptional errors that result from this process are unintentional and may not be corrected upon review.

## 2021-07-29 NOTE — Progress Notes (Unsigned)
Enrolled patient for a 14 day Zio AT monitor to be mailed to patients home   O' Neal to read

## 2021-07-29 NOTE — Patient Instructions (Signed)
Medication Instructions:  No Changes *If you need a refill on your cardiac medications before your next appointment, please call your pharmacy*   Lab Work: No Labs If you have labs (blood work) drawn today and your tests are completely normal, you will receive your results only by: [place] (if you have MyChart) OR A paper copy in the mail If you have any lab test that is abnormal or we need to change your treatment, we will call you to review the results.   Testing/Procedures: ZIO AT Long term monitor-Live Telemetry  Your physician has requested you wear a ZIO patch monitor for 7 days.  This is a single patch monitor. [employer] supplies one patch monitor per enrollment. Additional  stickers are not available.  Please do not apply patch if you will be having a Nuclear Stress Test, Echocardiogram, Cardiac CT, MRI,  or Chest Xray during the period you would be wearing the monitor. The patch cannot be worn during  these tests. You cannot remove and re-apply the ZIO AT patch monitor.  Your ZIO patch monitor will be mailed 3 day USPS to your address on file. It may take 3-5 days to  receive your monitor after you have been enrolled.  Once you have received your monitor, please review the enclosed instructions. Your monitor has  already been registered assigning a specific monitor serial # to you.   Billing and Patient Assistance Program information  Terry [name] has been supplied with any insurance information on record for billing. [employer] offers a sliding scale Patient Assistance Program for patients without insurance, or whose  insurance does not completely cover the cost of the ZIO patch monitor. You must apply for the  Patient Assistance Program to qualify for the discounted rate. To apply, call [employer] at [phone],  select option 4, select option 2 , ask to apply for the Patient Assistance Program, (you can request an  interpreter if needed). [employer] will ask your household  income and how many people are in your  household. [employer] will quote your out-of-pocket cost based on this information. They will also be able  to set up a 12 month interest free payment plan if needed.  Applying the monitor   Shave hair from upper left chest.  Hold the abrader disc by orange tab. Rub the abrader in 40 strokes over left upper chest as indicated in  your monitor instructions.  Clean area with 4 enclosed alcohol pads. Use all pads to ensure the area is cleaned thoroughly. Let  dry.  Apply patch as indicated in monitor instructions. Patch will be placed under collarbone on left side of  chest with arrow pointing upward.  Rub patch adhesive wings for 2 minutes. Remove the white label marked "1". Remove the white label  marked "2". Rub patch adhesive wings for 2 additional minutes.  While looking in a mirror, press and release button in center of patch. A small green light will flash 3-4  times. This will be your only indicator that the monitor has been turned on.  Do not shower for the first 24 hours. You may shower after the first 24 hours.  Press the button if you feel a symptom. You will hear a small click. Record Date, Time and Symptom in  the Patient Log.   Starting the Gateway  In your kit there is a Hydrographic surveyor box the size of a cellphone. This is Airline pilot. It transmits all your  recorded data to [facility]. This box must  always stay within 10 feet of you. Open the box and push the *  button. There will be a light that blinks orange and then green a few times. When the light stops  blinking, the Gateway is connected to the ZIO patch. Call [employer] at [phone] to confirm your monitor is transmitting.  Returning your monitor  Remove your patch and place it inside the [place]. In the lower half of the Gateway there is a white  bag with prepaid postage on it. Place Gateway in bag and seal. Mail package back to Terry [name] as soon as  possible. Your physician should  have your final report approximately 7 days after you have mailed back  your monitor. Call [place] at [phone] if you have questions regarding your ZIO AT  patch monitor. Call them immediately if you see an orange light blinking on your monitor.  If your monitor falls off in less than 4 days, contact our Monitor department at [phone]. If your  monitor becomes loose or falls off after 4 days call [employer] at [phone] for suggestions on  securing your monitor    Follow-Up: At [facility], you and your health needs are our priority.  As part of our continuing mission to provide you with exceptional heart care, we have created designated Provider Care Teams.  These Care Teams include your primary Cardiologist (physician) and Advanced Practice Providers (APPs -  Physician Assistants and Nurse Practitioners) who all work together to provide you with the care you need, when you need it.  We recommend signing up for the patient portal called "MyChart".  Sign up information is provided on this After Visit Summary.  MyChart is used to connect with patients for Virtual Visits (Telemedicine).  Patients are able to view lab/test results, encounter notes, upcoming appointments, etc.  Non-urgent messages can be sent to your provider as well.   To learn more about what you can do with MyChart, go to [email or web address].    Your next appointment:   1 month(s)  The format for your next appointment:   In Person  Provider:   Evalina Field, MD     Other Instructions If Abdominal Pain Continues or Increases . Please go to the Emergency Department.

## 2021-08-01 ENCOUNTER — Ambulatory Visit: Payer: Medicare PPO | Admitting: Surgery

## 2021-08-01 DIAGNOSIS — R55 Syncope and collapse: Secondary | ICD-10-CM | POA: Diagnosis not present

## 2021-08-15 ENCOUNTER — Encounter (HOSPITAL_BASED_OUTPATIENT_CLINIC_OR_DEPARTMENT_OTHER): Payer: Self-pay

## 2021-08-15 ENCOUNTER — Emergency Department (HOSPITAL_BASED_OUTPATIENT_CLINIC_OR_DEPARTMENT_OTHER)
Admission: EM | Admit: 2021-08-15 | Discharge: 2021-08-15 | Disposition: A | Discharge status: Home / Self Care | Payer: Medicare Other | Attending: Emergency Medicine | Admitting: Emergency Medicine

## 2021-08-15 ENCOUNTER — Emergency Department (HOSPITAL_BASED_OUTPATIENT_CLINIC_OR_DEPARTMENT_OTHER): Payer: Medicare Other

## 2021-08-15 ENCOUNTER — Other Ambulatory Visit: Payer: Self-pay

## 2021-08-15 DIAGNOSIS — I10 Essential (primary) hypertension: Secondary | ICD-10-CM | POA: Diagnosis not present

## 2021-08-15 DIAGNOSIS — R1032 Left lower quadrant pain: Secondary | ICD-10-CM | POA: Diagnosis not present

## 2021-08-15 DIAGNOSIS — I2510 Atherosclerotic heart disease of native coronary artery without angina pectoris: Secondary | ICD-10-CM | POA: Insufficient documentation

## 2021-08-15 DIAGNOSIS — R109 Unspecified abdominal pain: Secondary | ICD-10-CM

## 2021-08-15 DIAGNOSIS — Z7982 Long term (current) use of aspirin: Secondary | ICD-10-CM | POA: Diagnosis not present

## 2021-08-15 DIAGNOSIS — Z20822 Contact with and (suspected) exposure to covid-19: Secondary | ICD-10-CM | POA: Insufficient documentation

## 2021-08-15 DIAGNOSIS — Z79899 Other long term (current) drug therapy: Secondary | ICD-10-CM | POA: Insufficient documentation

## 2021-08-15 DIAGNOSIS — M545 Low back pain, unspecified: Secondary | ICD-10-CM | POA: Diagnosis not present

## 2021-08-15 LAB — CBC
HCT: 40.6 % (ref 39.0–52.0)
Hemoglobin: 13.4 g/dL (ref 13.0–17.0)
MCH: 28.6 pg (ref 26.0–34.0)
MCHC: 33 g/dL (ref 30.0–36.0)
MCV: 86.8 fL (ref 80.0–100.0)
Platelets: 197 10*3/uL (ref 150–400)
RBC: 4.68 MIL/uL (ref 4.22–5.81)
RDW: 13.8 % (ref 11.5–15.5)
WBC: 11.3 10*3/uL — ABNORMAL HIGH (ref 4.0–10.5)
nRBC: 0 % (ref 0.0–0.2)

## 2021-08-15 LAB — URINALYSIS, ROUTINE W REFLEX MICROSCOPIC
Bilirubin Urine: NEGATIVE
Glucose, UA: NEGATIVE mg/dL
Hgb urine dipstick: NEGATIVE
Ketones, ur: NEGATIVE mg/dL
Leukocytes,Ua: NEGATIVE
Nitrite: NEGATIVE
Protein, ur: NEGATIVE mg/dL
Specific Gravity, Urine: 1.018 (ref 1.005–1.030)
pH: 7 (ref 5.0–8.0)

## 2021-08-15 LAB — COMPREHENSIVE METABOLIC PANEL
ALT: <5 U/L (ref 0–44)
AST: 11 U/L — ABNORMAL LOW (ref 15–41)
Albumin: 4.4 g/dL (ref 3.5–5.0)
Alkaline Phosphatase: 53 U/L (ref 38–126)
Anion gap: 9 (ref 5–15)
BUN: 15 mg/dL (ref 8–23)
CO2: 26 mmol/L (ref 22–32)
Calcium: 9.1 mg/dL (ref 8.9–10.3)
Chloride: 101 mmol/L (ref 98–111)
Creatinine, Ser: 0.88 mg/dL (ref 0.61–1.24)
GFR, Estimated: >60 mL/min (ref >60)
Glucose, Bld: 112 mg/dL — ABNORMAL HIGH (ref 70–99)
Potassium: 4.2 mmol/L (ref 3.5–5.1)
Sodium: 136 mmol/L (ref 135–145)
Total Bilirubin: 0.8 mg/dL (ref 0.3–1.2)
Total Protein: 7.1 g/dL (ref 6.5–8.1)

## 2021-08-15 LAB — DIFFERENTIAL
Abs Immature Granulocytes: 0.05 10*3/uL (ref 0.00–0.07)
Basophils Absolute: 0 10*3/uL (ref 0.0–0.1)
Basophils Relative: 0 %
Eosinophils Absolute: 0.1 10*3/uL (ref 0.0–0.5)
Eosinophils Relative: 1 %
Immature Granulocytes: 1 %
Lymphocytes Relative: 25 %
Lymphs Abs: 2.7 10*3/uL (ref 0.7–4.0)
Monocytes Absolute: 0.7 10*3/uL (ref 0.1–1.0)
Monocytes Relative: 6 %
Neutro Abs: 7.6 10*3/uL (ref 1.7–7.7)
Neutrophils Relative %: 67 %

## 2021-08-15 LAB — RESP PANEL BY RT-PCR (FLU A&B, COVID) ARPGX2
Influenza A by PCR: NEGATIVE
Influenza B by PCR: NEGATIVE
SARS Coronavirus 2 by RT PCR: NEGATIVE

## 2021-08-15 LAB — LIPASE, BLOOD: Lipase: 14 U/L (ref 11–51)

## 2021-08-15 LAB — TROPONIN I (HIGH SENSITIVITY)
Troponin I (High Sensitivity): 3 ng/L (ref <18)
Troponin I (High Sensitivity): 3 ng/L (ref <18)

## 2021-08-15 IMAGING — DX DG CHEST 1V PORT
1 series · 1 of 1 positions shown · non-contrast
Comparison: [DATE].

CLINICAL DATA: Acute lower abdominal pain.

EXAM:
PORTABLE CHEST 1 VIEW

[chest]
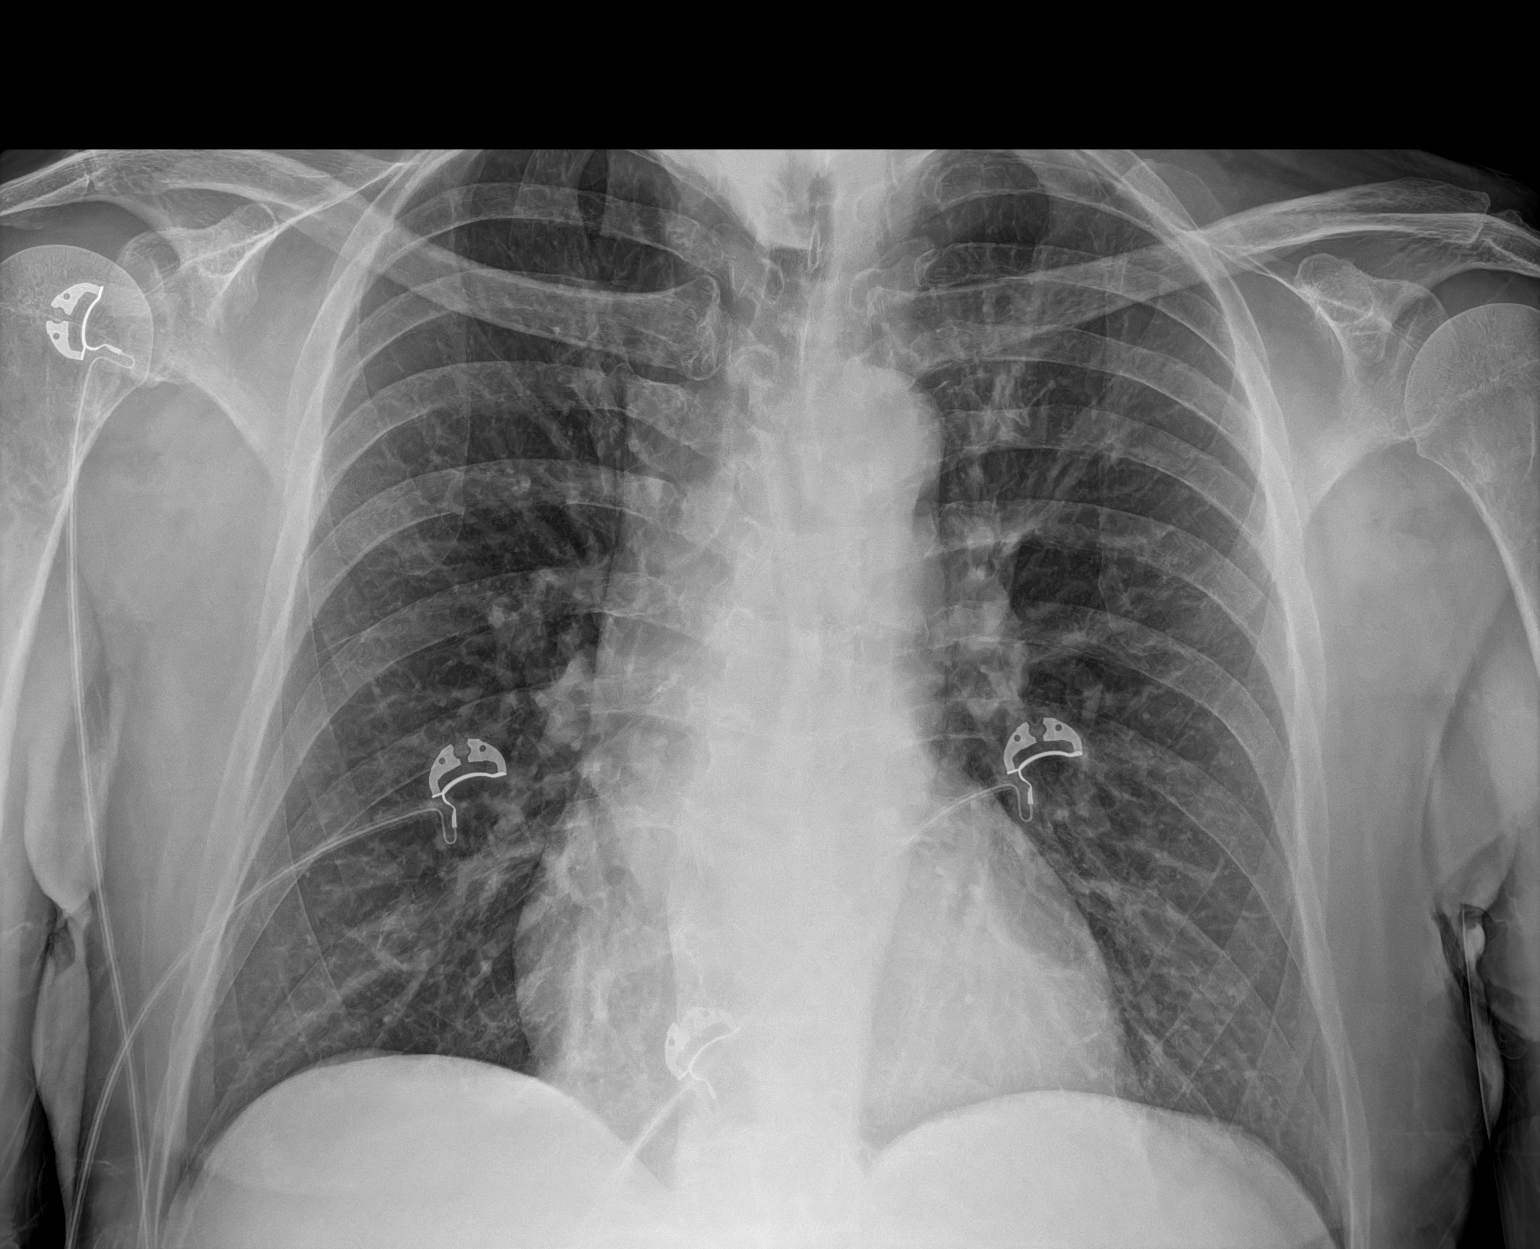

[1 of 1 positions shown; findings below may reference images not displayed]

FINDINGS: The heart size and mediastinal contours are within normal limits.
Both lungs are clear. The visualized skeletal structures are
unremarkable.
IMPRESSION: No active disease.

## 2021-08-15 IMAGING — CT CT ABD-PELV W/ CM
2 of 5 series · 16 of 46 positions shown, 18 images · IV contrast (APPLIED)
Comparison: [DATE]

CLINICAL DATA: Left lower quadrant abdominal pain since this
morning.

EXAM:
CT ABDOMEN AND PELVIS WITH CONTRAST
TECHNIQUE: Multidetector CT imaging of the abdomen and pelvis was performed
using the standard protocol following bolus administration of
intravenous contrast.

[Series 2: abd pel w · axial · 0.75mm/px · z∈[-341,+59]mm · 13 of 90 slices shown, 15 images]
[im 5/90  soft-tissue]
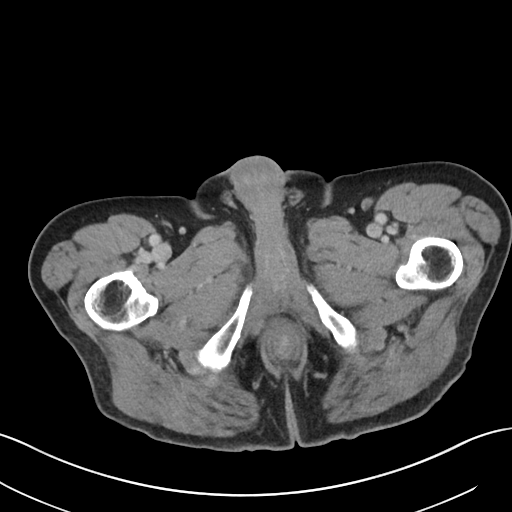
[im 5/90  bone]
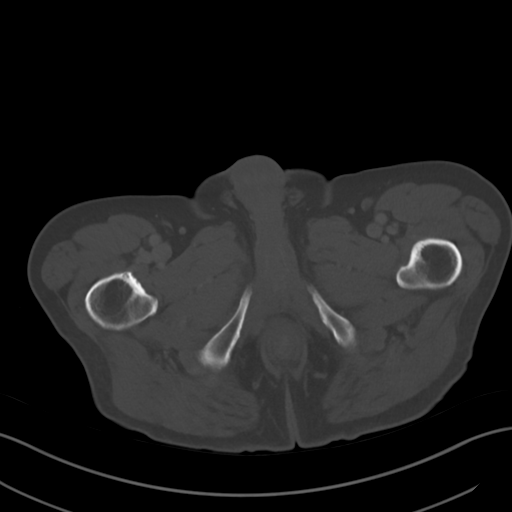
[im 10/90  soft-tissue]
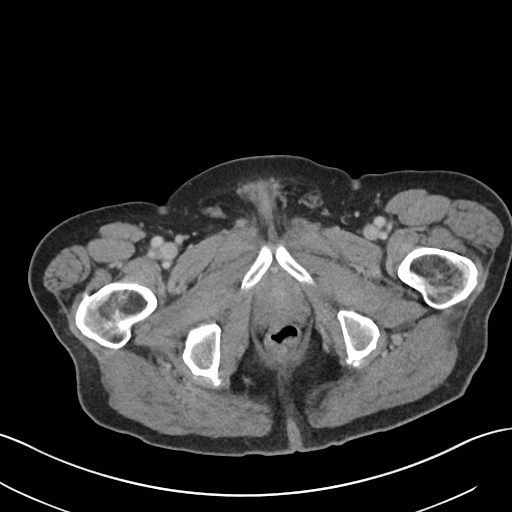
[im 20/90  soft-tissue]
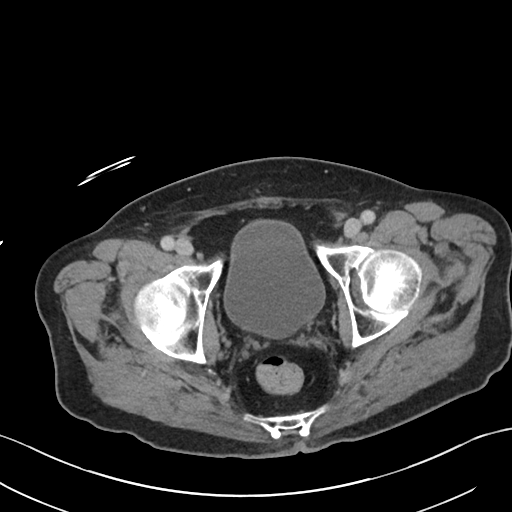
[im 25/90  soft-tissue]
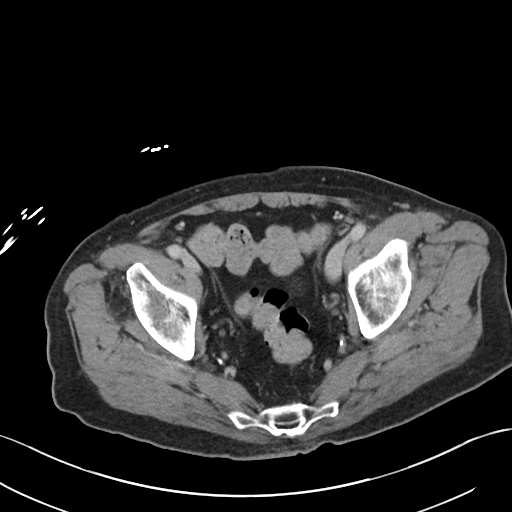
[im 30/90  soft-tissue]
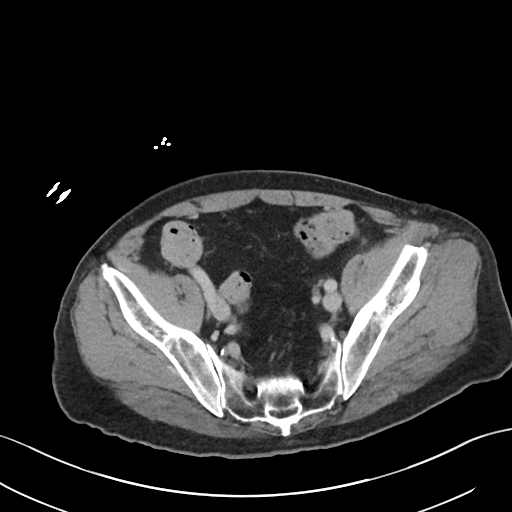
[im 40/90  soft-tissue]
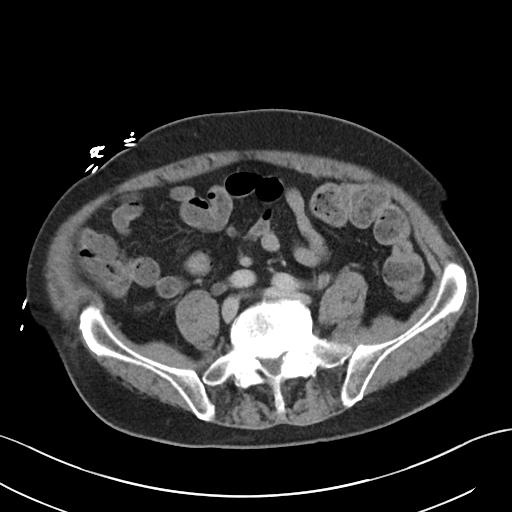
[im 45/90  soft-tissue]
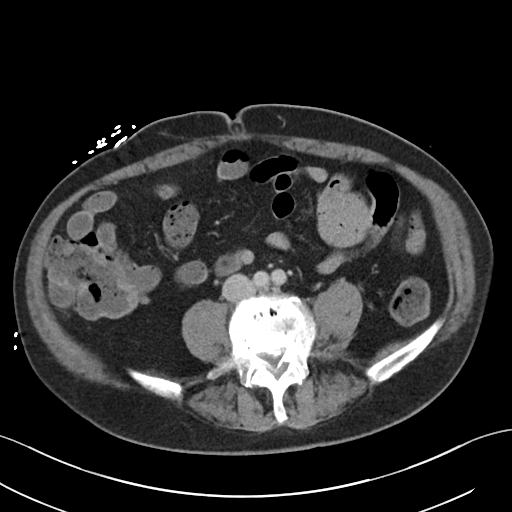
[im 50/90  soft-tissue]
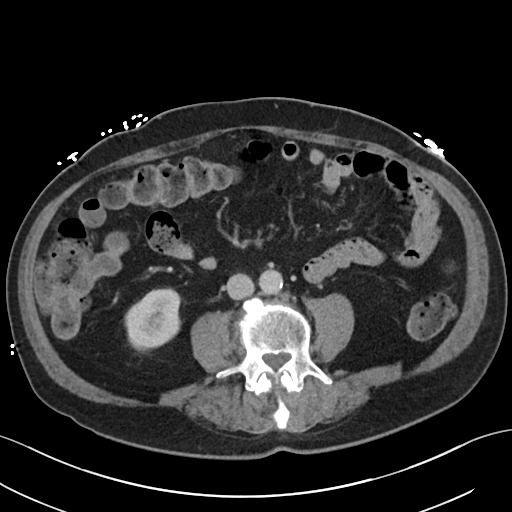
[im 60/90  soft-tissue]
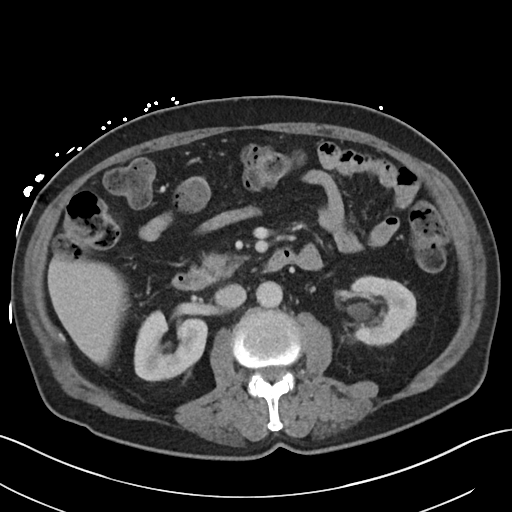
[im 60/90  bone]
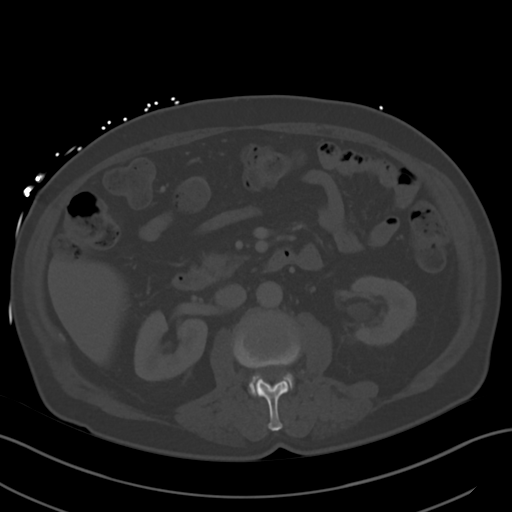
[im 65/90  soft-tissue]
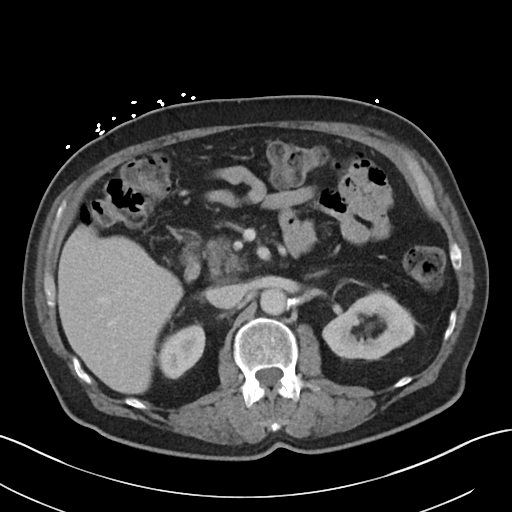
[im 70/90  soft-tissue]
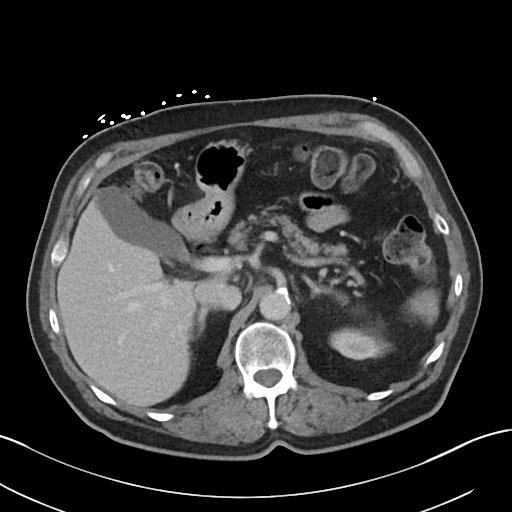
[im 80/90  soft-tissue]
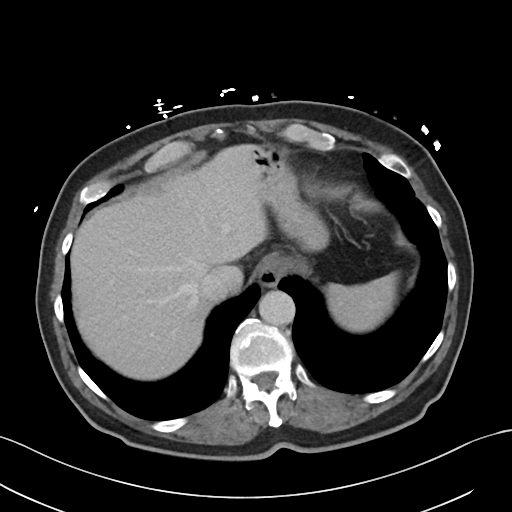
[im 85/90  soft-tissue]
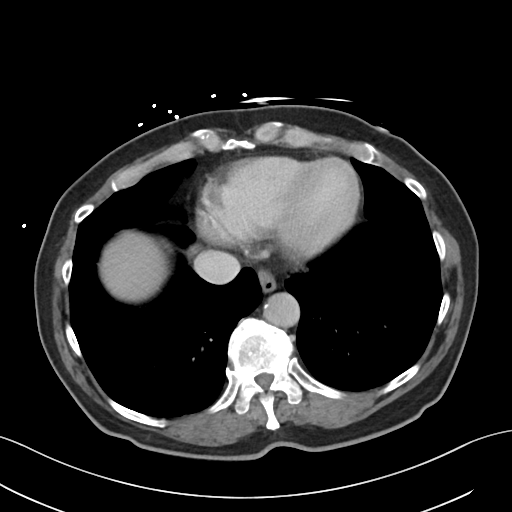

[Series 5: coronal · coronal · 0.84mm/px · 3 of 93 slices shown]
[im 31/93  soft-tissue]
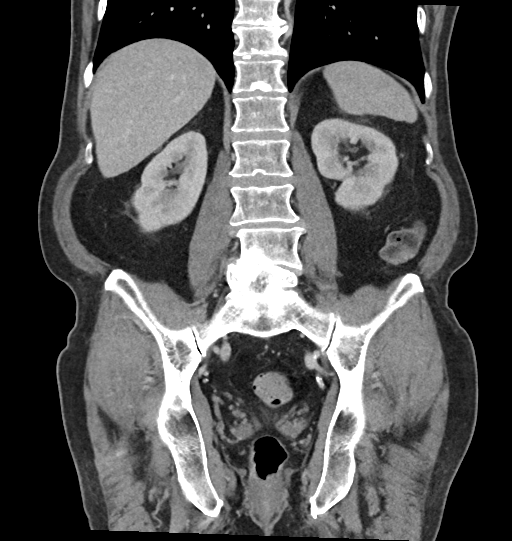
[im 41/93  soft-tissue]
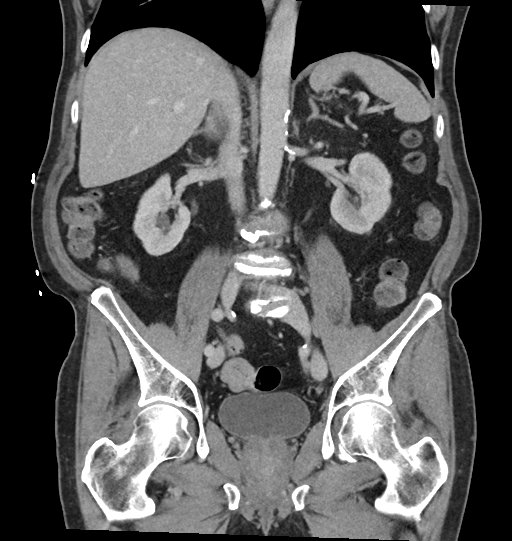
[im 52/93  soft-tissue]
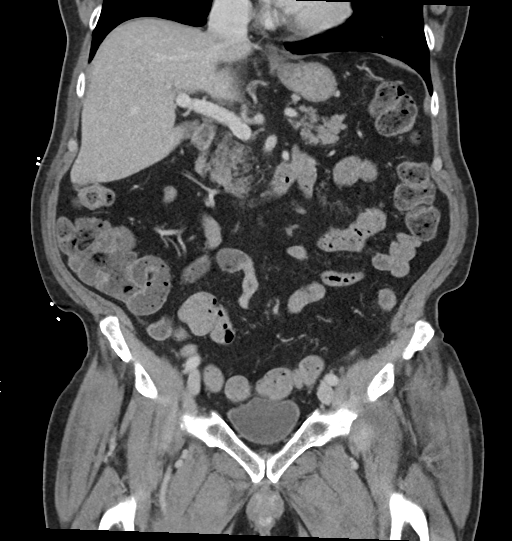

[16 of 46 positions shown; findings below may reference images not displayed]

RADIATION DOSE REDUCTION: This exam was performed according to the
departmental dose-optimization program which includes automated
exposure control, adjustment of the mA and/or kV according to
patient size and/or use of iterative reconstruction technique.

CONTRAST:  85mL OMNIPAQUE IOHEXOL 300 MG/ML  SOLN
FINDINGS: Lower chest: Unremarkable.

Hepatobiliary: No focal liver abnormality is seen. No gallstones,
gallbladder wall thickening, or biliary dilatation.

Pancreas: Unremarkable. No pancreatic ductal dilatation or
surrounding inflammatory changes.

Spleen: Normal in size without focal abnormality.

Adrenals/Urinary Tract: Adrenal glands are unremarkable. Right
kidney is normal, without renal calculi, focal lesion, or
hydronephrosis. Bladder is unremarkable. Stable small left renal
parapelvic cysts. Otherwise, normal left kidney.

Stomach/Bowel: Stomach is within normal limits. Appendix appears
normal. No evidence of bowel wall thickening, distention, or
inflammatory changes.

Vascular/Lymphatic: Atheromatous arterial calcifications without
aneurysm. No enlarged lymph nodes.

Reproductive: Mildly enlarged prostate gland.

Other: Small bilateral inguinal hernias containing fat. Tiny
umbilical hernia containing fat.

Musculoskeletal: Lumbar and lower thoracic spine degenerative
changes and straightening of the normal lower lumbar lordosis. There
is also grade 1 retrolisthesis at the L3-4 and L4-5 levels without
significant change.
IMPRESSION: No acute abnormality.

## 2021-08-15 MED ORDER — ONDANSETRON HCL 4 MG/2ML IJ SOLN
4.0000 mg | Freq: Once | INTRAMUSCULAR | Status: AC
Start: 2021-08-15 — End: 2021-08-15
  Administered 2021-08-15: 4 mg via INTRAVENOUS
  Filled 2021-08-15: qty 2

## 2021-08-15 MED ORDER — DICYCLOMINE HCL 10 MG/ML IM SOLN: 20.0000 mg | Freq: Once | INTRAMUSCULAR | Status: DC

## 2021-08-15 MED ORDER — DICYCLOMINE HCL 10 MG PO CAPS
10.0000 mg | ORAL_CAPSULE | Freq: Once | ORAL | Status: AC
  Administered 2021-08-15: 10 mg via ORAL
  Filled 2021-08-15: qty 1

## 2021-08-15 MED ORDER — IOHEXOL 300 MG/ML  SOLN
75.0000 mL | Freq: Once | INTRAMUSCULAR | Status: AC | PRN
  Administered 2021-08-15: 85 mL via INTRAVENOUS

## 2021-08-15 MED ORDER — MORPHINE SULFATE (PF) 2 MG/ML IV SOLN
2.0000 mg | Freq: Once | INTRAVENOUS | Status: AC
  Administered 2021-08-15: 2 mg via INTRAVENOUS
  Filled 2021-08-15: qty 1

## 2021-08-15 MED ORDER — SODIUM CHLORIDE 0.9 % IV BOLUS
1000.0000 mL | Freq: Once | INTRAVENOUS | Status: AC
  Administered 2021-08-15: 1000 mL via INTRAVENOUS

## 2021-08-15 MED ORDER — ACETAMINOPHEN 325 MG PO TABS: 650.0000 mg | ORAL_TABLET | Freq: Four times a day (QID) | ORAL | 0 refills | Status: DC | PRN

## 2021-08-15 MED ORDER — DICYCLOMINE HCL 20 MG PO TABS: 10.0000 mg | ORAL_TABLET | Freq: Every day | ORAL | 0 refills | Status: DC

## 2021-08-15 MED ORDER — SUCRALFATE 1 G PO TABS: 1.0000 g | ORAL_TABLET | Freq: Three times a day (TID) | ORAL | 0 refills | Status: DC

## 2021-08-15 MED ORDER — FAMOTIDINE IN NACL 20-0.9 MG/50ML-% IV SOLN
20.0000 mg | Freq: Once | INTRAVENOUS | Status: AC
  Administered 2021-08-15: 20 mg via INTRAVENOUS
  Filled 2021-08-15: qty 50

## 2021-08-15 NOTE — ED Triage Notes (Signed)
Pt reports left lower abdominal pain that started this morning. Pt denies any associated symptoms.

## 2021-08-15 NOTE — ED Notes (Signed)
Patient walked to the bathroom with minimal/no assist without incident. Currently in bathroom at this time.

## 2021-08-15 NOTE — ED Provider Notes (Signed)
[facility] EMERGENC[facility] DEPT Provider Note   CSN: [id number] Arrival date & time: 08/15/21  1130     History  Chief Complaint  Patient presents with   Abdominal Pain    Terry [name] is a 76 y.o. male.  This is a 76 y.o. male  with significant medical history as below, including essential hypertension, GERD, Parkinson's disease who presents to the ED with complaint of left lower quadrant abdominal pain.  Patient reports onset of pain suddenly this morning.  Pain described as sharp, stabbing, intermittent radiation to his lumbar region..  Worsened with ambulation or sitting upright.  Worse with moving his leg.  No trauma.  No change in bowel or bladder function.  No pain to his penis or scrotum, no testicular or penile swelling.  No hematuria.  No nausea or vomiting.  No chest pain or dyspnea.  No fevers or chills.  Denies similar symptoms in the past.  Compliant with home medications.  No recent diet changes or medication changes.  No change with p.o. intake or defecation.  No melena or blood per rectum.  Accompanied by spouse      Past Medical History: 04/29/2018: Carotid artery stenosis 04/29/2018: Chronic tension headaches 04/29/2018: Essential hypertension 04/29/2018: GERD (gastroesophageal reflux disease) 04/29/2018: HLD (hyperlipidemia) 04/29/2018: Parkinson's disease ([place])  Past Surgical History: 04/27/2021: ENDARTERECTOMY; Left     Comment:  Procedure: LEFT CAROTID ENDARTERECTOMY;  Surgeon:               Serafina Mitchell, MD;  Location: [facility] OR;  Service:               Vascular;  Laterality: Left; 11/25/2019: LEFT HEART CATH AND CORONARY ANGIOGRAPHY; N/A     Comment:  Procedure: LEFT HEART CATH AND CORONARY ANGIOGRAPHY;                Surgeon: Belva Crome, MD;  Location: [facility]               [facility];  Service: Cardiovascular;  Laterality: N/A; 04/27/2021: PATCH ANGIOPLASTY; Left     Comment:  Procedure: [place];  Surgeon: Serafina Mitchell, MD;  Location: [facility];               Service: Vascular;  Laterality: Left; No date: TONSILLECTOMY    The history is provided by the patient and the spouse. No language interpreter was used.  Abdominal Pain Associated symptoms: no chest pain, no chills, no cough, no fever, no hematuria, no nausea, no shortness of breath and no vomiting       Home Medications Prior to Admission medications   Medication Sig Start Date End Date Taking? Authorizing Provider  acetaminophen (TYLENOL) 325 MG tablet Take 2 tablets (650 mg total) by mouth every 6 (six) hours as needed. 08/15/21  Yes Jeanell Sparrow, DO  dicyclomine (BENTYL) 20 MG tablet Take 0.5 tablets (10 mg total) by mouth daily for 5 days. 08/15/21 08/20/21 Yes Wynona Dove A, DO  sucralfate (CARAFATE) 1 g tablet Take 1 tablet (1 g total) by mouth 4 (four) times daily -  with meals and at bedtime for 7 days. 08/15/21 08/22/21 Yes Jeanell Sparrow, DO  acetaminophen (TYLENOL) 500 MG tablet Take 500 mg by mouth every 6 (six) hours as needed for moderate pain or headache.    [provider]  amLODipine (NORVASC) 10  MG tablet Take 1 tablet (10 mg total) by mouth daily. Patient taking differently: Take 5 mg by mouth in the morning and at bedtime. 11/18/19 07/19/21  Geralynn Rile, MD  amLODipine (NORVASC) 2.5 MG tablet Take 2.5 mg by mouth daily. 07/06/21   [provider]  aspirin EC 81 MG EC tablet Take 1 tablet (81 mg total) by mouth daily. Swallow whole. 04/29/21   Darliss Cheney, MD  atorvastatin (LIPITOR) 20 MG tablet TAKE ONE TABLET BY MOUTH EVERY NIGHT AT BEDTIME Patient taking differently: Take 20 mg by mouth every evening. 05/19/20   [place], Carley D, DO  carbidopa-levodopa (SINEMET CR) 50-200 MG tablet Take 1 tablet by mouth at bedtime. 07/29/18   Kathi Ludwig, MD  carbidopa-levodopa (SINEMET IR) 25-100 MG tablet Take 2.5 tablets by mouth 4 (four) times daily. 7am, 12pm, 5pm, 10pm     [provider]  cetirizine (ZYRTEC) 10 MG tablet Take 10 mg by mouth as needed for allergies.     [provider]  fluticasone (FLONASE) 50 MCG/ACT nasal spray Place 1 spray into both nostrils daily as needed for allergies.     [provider]  meloxicam (MOBIC) 15 MG tablet Take 15 mg by mouth at bedtime as needed for pain. 11/03/19   [provider]  NEUPRO 4 MG/24HR Place 1 patch onto the skin daily. 04/19/21   [provider]  nitroGLYCERIN (NITROSTAT) 0.4 MG SL tablet Place 1 tablet (0.4 mg total) under the tongue every 5 (five) minutes as needed for chest pain. 10/16/19 04/23/21  Geralynn Rile, MD  pantoprazole (PROTONIX) 40 MG tablet Take 40 mg by mouth daily.    [provider]  tiZANidine (ZANAFLEX) 2 MG tablet Take 2 mg by mouth daily as needed for muscle spasms. 04/18/21   [provider]  traZODone (DESYREL) 50 MG tablet Take 1 tablet (50 mg total) by mouth at bedtime as needed for sleep. 09/03/18   Kathi Ludwig, MD  valsartan (DIOVAN) 80 MG tablet Take 80 mg by mouth daily. 03/23/21   [provider]      Allergies    Penicillins, Erythromycin, and Clam shell    Review of Systems   Review of Systems  Constitutional:  Negative for chills and fever.  HENT:  Negative for facial swelling and trouble swallowing.   Eyes:  Negative for photophobia and visual disturbance.  Respiratory:  Negative for cough and shortness of breath.   Cardiovascular:  Negative for chest pain and palpitations.  Gastrointestinal:  Positive for abdominal pain. Negative for nausea and vomiting.  Endocrine: Negative for polydipsia and polyuria.  Genitourinary:  Negative for difficulty urinating, hematuria, penile pain, penile swelling, scrotal swelling, testicular pain and urgency.  Musculoskeletal:  Negative for gait problem and joint swelling.  Skin:  Negative for pallor and rash.  Neurological:  Negative for syncope and  headaches.  Psychiatric/Behavioral:  Negative for agitation and confusion.    Physical Exam Updated Vital Signs BP (!) 148/66    Pulse (!) 58    Temp 98.1 F (36.7 C)    Resp 17    Ht 6\' 1"  (1.854 m)    Wt 77.1 kg    SpO2 100%    BMI 22.43 kg/m  Physical Exam Vitals and nursing note reviewed.  Constitutional:      General: He is not in acute distress.    Appearance: He is well-developed.  HENT:     Head: Normocephalic and atraumatic.  Right Ear: External ear normal.     Left Ear: External ear normal.     Mouth/Throat:     Mouth: Mucous membranes are moist.  Eyes:     General: No scleral icterus. Cardiovascular:     Rate and Rhythm: Normal rate and regular rhythm.     Pulses: Normal pulses.          Radial pulses are 2+ on the right side and 2+ on the left side.       Dorsalis pedis pulses are 2+ on the right side and 2+ on the left side.     Heart sounds: Normal heart sounds.     Comments: LE warm, well perfused Pulmonary:     Effort: Pulmonary effort is normal. No respiratory distress.     Breath sounds: Normal breath sounds.  Abdominal:     General: Abdomen is flat.     Palpations: Abdomen is soft.     Tenderness: There is abdominal tenderness in the left lower quadrant. There is no guarding or rebound.       Comments: Nonperitoneal, no cellulitic findings at site of discomfort, no rash, no zoster, no hernia.   Musculoskeletal:        General: Normal range of motion.     Cervical back: Normal range of motion.     Right lower leg: No edema.     Left lower leg: No edema.  Skin:    General: Skin is warm and dry.     Capillary Refill: Capillary refill takes less than 2 seconds.  Neurological:     Mental Status: He is alert and oriented to person, place, and time.  Psychiatric:        Mood and Affect: Mood normal.        Behavior: Behavior normal.    ED Results / Procedures / Treatments   Labs (all labs ordered are listed, but only abnormal results are  displayed) Labs Reviewed  COMPREHENSIVE METABOLIC PANEL - Abnormal; Notable for the following components:      Result Value   Glucose, Bld 112 (*)    AST 11 (*)    All other components within normal limits  CBC - Abnormal; Notable for the following components:   WBC 11.3 (*)    All other components within normal limits  URINALYSIS, ROUTINE W REFLEX MICROSCOPIC - Abnormal; Notable for the following components:   Color, Urine COLORLESS (*)    Bacteria, UA RARE (*)    All other components within normal limits  RESP PANEL BY RT-PCR (FLU A&B, COVID) ARPGX2  LIPASE, BLOOD  DIFFERENTIAL  TROPONIN I (HIGH SENSITIVITY)  TROPONIN I (HIGH SENSITIVITY)    EKG EKG Interpretation  Date/Time:  Monday August 15 2021 12:07:31 EST Ventricular Rate:  67 PR Interval:  245 QRS Duration: 90 QT Interval:  378 QTC Calculation: 399 R Axis:   52 Text Interpretation: Sinus rhythm Prolonged PR interval Borderline repol abnormality, lateral leads similar to prior no stemi Confirmed by Wynona Dove ([id number]) on 08/15/2021 12:58:41 PM  Radiology CT ABDOMEN PELVIS W CONTRAST  Result Date: 08/15/2021 CLINICAL DATA:  Left lower quadrant abdominal pain since this morning. EXAM: CT ABDOMEN AND PELVIS WITH CONTRAST TECHNIQUE: Multidetector CT imaging of the abdomen and pelvis was performed using the standard protocol following bolus administration of intravenous contrast. RADIATION DOSE REDUCTION: This exam was performed according to the departmental dose-optimization program which includes automated exposure control, adjustment of the mA and/or kV according to patient size and/or  use of iterative reconstruction technique. CONTRAST:  4mL OMNIPAQUE IOHEXOL 300 MG/ML  SOLN COMPARISON:  07/19/2021 FINDINGS: Lower chest: Unremarkable. Hepatobiliary: No focal liver abnormality is seen. No gallstones, gallbladder wall thickening, or biliary dilatation. Pancreas: Unremarkable. No pancreatic ductal dilatation or surrounding  inflammatory changes. Spleen: Normal in size without focal abnormality. Adrenals/Urinary Tract: Adrenal glands are unremarkable. Right kidney is normal, without renal calculi, focal lesion, or hydronephrosis. Bladder is unremarkable. Stable small left renal parapelvic cysts. Otherwise, normal left kidney. Stomach/Bowel: Stomach is within normal limits. Appendix appears normal. No evidence of bowel wall thickening, distention, or inflammatory changes. Vascular/Lymphatic: Atheromatous arterial calcifications without aneurysm. No enlarged lymph nodes. Reproductive: Mildly enlarged prostate gland. Other: Small bilateral inguinal hernias containing fat. Tiny umbilical hernia containing fat. Musculoskeletal: Lumbar and lower thoracic spine degenerative changes and straightening of the normal lower lumbar lordosis. There is also grade 1 retrolisthesis at the L3-4 and L4-5 levels without significant change. IMPRESSION: No acute abnormality. Electronically Signed   By: Claudie Revering M.D.   On: 08/15/2021 13:01   DG Chest Portable 1 View  Result Date: 08/15/2021 CLINICAL DATA:  Acute lower abdominal pain. EXAM: PORTABLE CHEST 1 VIEW COMPARISON:  April 20, 2021. FINDINGS: The heart size and mediastinal contours are within normal limits. Both lungs are clear. The visualized skeletal structures are unremarkable. IMPRESSION: No active disease. Electronically Signed   By: Marijo Conception M.D.   On: 08/15/2021 12:59    Procedures Procedures    Medications Ordered in ED Medications  sodium chloride 0.9 % bolus 1,000 mL (0 mLs Intravenous Stopped 08/15/21 1416)  iohexol (OMNIPAQUE) 300 MG/ML solution 75 mL (85 mLs Intravenous Contrast Given 08/15/21 1244)  morphine 2 MG/ML injection 2 mg (2 mg Intravenous Given 08/15/21 1346)  ondansetron (ZOFRAN) injection 4 mg (4 mg Intravenous Given 08/15/21 1347)  famotidine (PEPCID) IVPB 20 mg premix (0 mg Intravenous Stopped 08/15/21 1417)  dicyclomine (BENTYL) capsule 10 mg (10  mg Oral Given 08/15/21 1348)    ED Course/ Medical Decision Making/ A&P                           Medical Decision Making Amount and/or Complexity of Data Reviewed Labs: ordered. Radiology: ordered.  Risk OTC drugs. Prescription drug management.    CC: Left lower quadrant abdominal pain  This patient presents to the Emergency Department for the above complaint. This involves an extensive number of treatment options and is a complaint that carries with it a high risk of complications and morbidity. Vital signs were reviewed. Serious etiologies considered.  Record review:  Previous records obtained and reviewed   Additional history obtained from spouse  Work up as above, notable for:  Labs & imaging results that were available during my care of the patient were reviewed by me and considered in my medical decision making.   I ordered imaging studies which included CT a/p, cxr and I independently visualized and interpreted imaging which showed no acute process  Cardiac monitoring reviewed and interpreted personally which shows normal sinus rhythm  Social determinants of health include -Parkinson's disease   Management: IV fluids, analgesics, antiemetic.  Reassessment:  Pt reports feeling better. Rpt exam is soft, non-tender. Etiology of his abdominal pain is unclear, perhaps MSK. No GU complaints. LE pulses intact and LE are warm, well perfused, normal ROM to LE, ambulatory at typical gait (uses cane). Acute intra-abdominal etiology is less likely.   The patient's overall condition has  improved, the patient presents with abdominal pain without signs of peritonitis, or other life-threatening serious etiology. The patient understands that at this time there is no evidence for a more malignant underlying process, but the patient also understands that early in the process of an illness, an emergency department workup can be falsely reassuring. Detailed discussions were had with the  patient regarding current findings, and need for close f/u with PCP or on call doctor. The patient appears stable for discharge and has been instructed to return immediately if the symptoms worsen in any way, or in 12-24hr if not improved for re-evaluation. Patient verbalized understanding and is in agreement with current care plan.  All questions answered prior to discharge. D/w spouse regarding plan at bedside who is in agreement.       This chart was dictated using voice recognition software.  Despite best efforts to proofread,  errors can occur which can change the documentation meaning.         Final Clinical Impression(s) / ED Diagnoses Final diagnoses:  Nonspecific abdominal pain    Rx / DC Orders ED Discharge Orders          Ordered    dicyclomine (BENTYL) 20 MG tablet  Daily        08/15/21 1458    sucralfate (CARAFATE) 1 g tablet  3 times daily with meals & bedtime        08/15/21 1458    acetaminophen (TYLENOL) 325 MG tablet  Every 6 hours PRN        08/15/21 [place], Elsa Ploch A, DO 08/16/21 [phone]

## 2021-08-15 NOTE — ED Notes (Signed)
EMT-P provided AVS using Teachback Method. Patient verbalizes understanding of Discharge Instructions. Opportunity for Questioning and Answers were provided by EMT-P. Patient Discharged from ED.  ? ?

## 2021-08-15 NOTE — Discharge Instructions (Addendum)
]

## 2021-08-17 ENCOUNTER — Other Ambulatory Visit: Payer: Self-pay | Admitting: Family Medicine

## 2021-08-17 DIAGNOSIS — M5416 Radiculopathy, lumbar region: Secondary | ICD-10-CM

## 2021-08-18 ENCOUNTER — Other Ambulatory Visit (HOSPITAL_COMMUNITY): Payer: Self-pay | Admitting: Family Medicine

## 2021-08-18 DIAGNOSIS — I701 Atherosclerosis of renal artery: Secondary | ICD-10-CM

## 2021-08-26 ENCOUNTER — Telehealth: Payer: Self-pay

## 2021-08-26 ENCOUNTER — Encounter: Payer: Self-pay | Admitting: Cardiovascular Disease

## 2021-08-26 NOTE — Telephone Encounter (Signed)
-----   Message from Sande Rives, MD sent at 08/22/2021 12:19 PM EST ----- Can we get him in to see me to discuss? Looks benign per discussion with EP but would like to follow-up.   Gerri Spore T. Flora Lipps, MD, [facility]   [facility]  [address] [place], [place] [address] [phone]  12:19 PM

## 2021-08-26 NOTE — Telephone Encounter (Signed)
Called patient, LVM to call back- we would like for him to see Dr.O'Neal, I did go ahead and schedule around the same time and day as office visit with [name], NP. Advised patient to call back to discuss further.  Left call back number.

## 2021-08-29 ENCOUNTER — Ambulatory Visit (HOSPITAL_COMMUNITY)
Admission: RE | Admit: 2021-08-29 | Discharge: 2021-08-29 | Disposition: A | Discharge status: Home / Self Care | Payer: Medicare Other | Source: Ambulatory Visit | Attending: Family Medicine | Admitting: Family Medicine

## 2021-08-29 ENCOUNTER — Other Ambulatory Visit: Payer: Self-pay

## 2021-08-29 DIAGNOSIS — I701 Atherosclerosis of renal artery: Secondary | ICD-10-CM | POA: Diagnosis present

## 2021-08-29 NOTE — Progress Notes (Signed)
Renal artery duplex has been completed.   Preliminary results in CV Proc.   Aundra Millet Vola Beneke 08/29/2021 9:40 AM

## 2021-08-29 NOTE — Telephone Encounter (Signed)
Patient aware via mychart message of date and time of appointment.  Patient viewed [employer].

## 2021-09-01 NOTE — Progress Notes (Signed)
Cardiology Office Note:   Date:  09/02/2021  NAME:  Terry [name]    MRN: [id number] DOB:  08-Nov-1945   PCP:  Dois Davenport, MD  Cardiologist:  [name], MD  Electrophysiologist:  None   Referring MD: Dois Davenport, MD   Chief Complaint  Patient presents with   Follow-up   History of Present Illness:   Terry [name] is a 76 y.o. male with a hx of nonobstructive CAD, carotid artery disease status post left carotid endarterectomy, mesenteric stenosis, hyperlipidemia who presents for follow-up.  Evaluated recently for syncope.  Monitor shows sinus pause of 4 seconds.  Echo in the past normal.  Left heart cath with minimal disease.  He reports he has had 5-6 episodes of dizziness and lightheadedness.  He reports he is passed out once or twice.  Symptoms occur with change in position.  He reports when he stands up or changes position he can get lightheaded and dizzy.  He reports blurry vision.  He reports he can circumvent the episodes by sitting down.  He does have a known history of Parkinson's disease.  Recent monitor shows a 4-second pause.  His EKG is narrow QRS.  There is no evidence of high-grade conduction disease.  We did check orthostatic vital signs.  Laying 180/78 heart rate 78, sitting 154/75 heart rate 78, sitting 121/89 heart rate 83, standing 114/61 heart rate 82.  He did report dizziness upon standing.  He reports no chest pain or trouble breathing prior to the episodes.  He has been diagnosed with extensive peripheral vascular disease.  He is undergone carotid arterectomy.  He also has severe stenoses and abdominal vasculature.  He is been losing up to 40 pounds.  He reports no difficulty losing weight but just no energy.  He does snore.  He has Parkinson's disease.  He apparently needs to be reevaluated by his neurologist.  I do agree with this.  All other vital signs are stable.  Seems to be doing well otherwise.  We did discuss that he is not drinking enough water.  He  may drink 2 glasses of water daily.  I suspect dehydration is contributing.  Problem List 1. Non-obstructive CAD -LHC 11/25/2019 with 20% mid LAD 2. Parkinson's  3. 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 PAD (abdominal) -SMA >70% -IMA >70% 6. HLD -Total cholesterol 78, HDL 33, LDL 38, triglycerides 34 7. Sinus pauses   Past Medical History: Past Medical History:  Diagnosis Date   Carotid artery stenosis 04/29/2018   Chronic tension headaches 04/29/2018   Essential hypertension 04/29/2018   GERD (gastroesophageal reflux disease) 04/29/2018   HLD (hyperlipidemia) 04/29/2018   Parkinson's disease (HCC) 04/29/2018    Past Surgical History: Past Surgical History:  Procedure Laterality Date   ENDARTERECTOMY Left 04/27/2021   Procedure: LEFT CAROTID ENDARTERECTOMY;  Surgeon: Nada Libman, MD;  Location: [facility] OR;  Service: Vascular;  Laterality: Left;   LEFT HEART CATH AND CORONARY ANGIOGRAPHY N/A 11/25/2019   Procedure: LEFT HEART CATH AND CORONARY ANGIOGRAPHY;  Surgeon: Lyn Records, MD;  Location: [facility] INVASIVE CV LAB;  Service: Cardiovascular;  Laterality: N/A;   PATCH ANGIOPLASTY Left 04/27/2021   Procedure: PATCH ANGIOPLASTY USING Livia Snellen BIOLOGIC PATCH;  Surgeon: Nada Libman, MD;  Location: [facility] OR;  Service: Vascular;  Laterality: Left;   TONSILLECTOMY      Current Medications: No outpatient medications have been marked as taking for the 09/02/21 encounter (Office  Visit) with Sande Rives, MD.     Allergies:    Penicillins, Erythromycin, and Clam shell   Social History: Social History   Socioeconomic History   Marital status: Married    Spouse name: Not on file   Number of children: Not on file   Years of education: Not on file   Highest education level: Not on file  Occupational History   Not on file  Tobacco Use   Smoking status: Never   Smokeless tobacco: Current    Types: Snuff  Vaping Use    Vaping Use: Never used  Substance and Sexual Activity   Alcohol use: Yes    Comment: Occasionally.   Drug use: Never   Sexual activity: Not on file  Other Topics Concern   Not on file  Social History Narrative   Not on file   Social Determinants of Health   Financial Resource Strain: Not on file  Food Insecurity: Not on file  Transportation Needs: Not on file  Physical Activity: Not on file  Stress: Not on file  Social Connections: Not on file     Family History: The patient's family history includes Cancer in his father; Heart disease in his mother. There is no history of Colon cancer or Stomach cancer.  ROS:   All other ROS reviewed and negative. Pertinent positives noted in the HPI.     EKGs/Labs/Other Studies Reviewed:   The following studies were personally reviewed by me today:  Abdominal Vasc US 08/29/2021 Right: Normal size right kidney. 1-59% stenosis of the right renal         artery. Abnormal right Resistive Index.  Left:  Normal size of left kidney. 1-59% stenosis of the left renal         artery. Abnormal left Resisitve Index.  Mesenteric:  70 to 99% stenosis in the celiac artery and superior mesenteric artery.   Cardiac Telemetry 08/22/2021 Impression: 1. Episode of sinus pause 08/07/2021 @ 2:32 pm which possibly represent paroxysmal AV block (vagal mediated) due to lengthening of the P-P interval with dropped QRS complexes. Total duration 4.4 seconds.  2. Second degree AV block, Mobitz 1 present.  3. Brief SVT was present, likely atrial tachycardia (6 episodes, longest 9.5 seconds).   TTE 04/24/2021  1. Left ventricular ejection fraction, by estimation, is 65 to 70%. The  left ventricle has normal function. The left ventricle has no regional  wall motion abnormalities. There is moderate left ventricular hypertrophy.  Left ventricular diastolic  parameters are indeterminate.   2. Right ventricular systolic function is normal. The right ventricular  size is  normal.   3. Left atrial size was mildly dilated.   4. Right atrial size was mildly dilated.   5. The mitral valve is grossly normal. No evidence of mitral valve  regurgitation.   6. The aortic valve is tricuspid. Aortic valve regurgitation is not  visualized.   LHC 11/25/2019 Normal left ventricular function with EF 65%.  Normal LVEDP. Normal left main. Luminal irregularities in the proximal to mid LAD less than 20%. Large ramus intermedius with luminal irregularities.  Widely patent. Dominant right coronary without evidence of obstruction or atherosclerosis.  Recent Labs: 04/24/2021: Magnesium 2.0 04/25/2021: TSH 1.272 08/15/2021: ALT <5; BUN 15; Creatinine, Ser 0.88; Hemoglobin 13.4; Platelets 197; Potassium 4.2; Sodium 136   Recent Lipid Panel    Component Value Date/Time   CHOL 78 04/28/2021 0445   CHOL 99 (L) 12/10/2019 1112   TRIG 52 04/28/2021 0445  HDL 37 (L) 04/28/2021 0445   HDL 47 12/10/2019 1112   CHOLHDL 2.1 04/28/2021 0445   VLDL 10 04/28/2021 0445   LDLCALC 31 04/28/2021 0445   LDLCALC 39 12/10/2019 1112    Physical Exam:   VS:  BP 132/60    Pulse 66    Ht 6\' 1"  (1.854 m)    Wt 170 lb 12.8 oz (77.5 kg)    SpO2 (!) 9%    BMI 22.53 kg/m    Wt Readings from Last 3 Encounters:  09/02/21 170 lb 12.8 oz (77.5 kg)  08/15/21 170 lb (77.1 kg)  07/29/21 171 lb 6.4 oz (77.7 kg)    General: Well nourished, well developed, in no acute distress Head: Atraumatic, normal size  Eyes: PEERLA, EOMI  Neck: Supple, no JVD Endocrine: No thryomegaly Cardiac: Normal S1, S2; RRR; no murmurs, rubs, or gallops Lungs: Clear to auscultation bilaterally, no wheezing, rhonchi or rales  Abd: Soft, nontender, no hepatomegaly  Ext: No edema, pulses 2+ Musculoskeletal: No deformities, BUE and BLE strength normal and equal Skin: Warm and dry, no rashes   Neuro: Alert and oriented to person, place, time, and situation, CNII-XII grossly intact, no focal deficits  Psych: Normal mood and  affect   ASSESSMENT:   Terry [name] is a 76 y.o. male who presents for the following: 1. Neurogenic orthostatic hypotension (HCC)   2. Sinus pause   3. Syncope and collapse   4. Mesenteric artery insufficiency (HCC)   5. Stenosis of left carotid artery   6. Mixed hyperlipidemia     PLAN:   1. Neurogenic orthostatic hypotension (HCC) 2. Sinus pause 3. Syncope and collapse -He presents for evaluation of episodes of dizziness and lightheadedness upon standing.  He has suffered syncope.  Recent monitor with 4-second pause.  To me this appears to be paroxysmal AV block likely from increased vagal tone.  The PP interval slows down.  His symptoms are more consistent with neurogenic orthostatic hypotension.  Vitals upon laying are 180/78 with heart rate 72.  Upon standing blood pressure drops to 114/61 with heart rate 82.  He does report dizziness upon standing.  He has Parkinson's disease so this is a logical diagnosis for him.  We will he also was not drinking enough water.  We discussed increasing up to 60 to 80 ounces of water per day.  I also recommended compression stockings as well as to minimize his position changes.  This is very difficult to treat but I think he can start with a conservative approach first.  Regarding his sinus pause I will have him see EP.  Overall I do not believe he will benefit from pacemaker implantation but given his neurological disorder I would like their input.  I think the bigger issue is neurogenic orthostatic hypotension. -For now we will keep a close eye on his medications.  Conservative approach as above.  4. Mesenteric artery insufficiency (HCC) -Extensive stenoses in the mesenteric vasculature.  He is losing weight.  He will see vascular surgery to follow-up. -Currently on aspirin 81 mg daily.  He is also on Lipitor 20 mg daily.  His most recent LDL cholesterol is 31.  5. Stenosis of left carotid artery -Symptomatic left carotid stenosis.  Status post left  endarterectomy.  Continue aspirin and statin.  Cholesterol level at goal.  6. Mixed hyperlipidemia -Continue statin.  Cholesterol level at goal.  Disposition: Return in about 6 months (around 03/02/2022).  Medication Adjustments/Labs and Tests Ordered: Current medicines are  reviewed at length with the patient today.  Concerns regarding medicines are outlined above.  Orders Placed This Encounter  Procedures   Ambulatory referral to Cardiac Electrophysiology   No orders of the defined types were placed in this encounter.   Patient Instructions  Medication Instructions:  The current medical regimen is effective;  continue present plan and medications.  *If you need a refill on your cardiac medications before your next appointment, please call your pharmacy*   Follow-Up: At [facility], you and your health needs are our priority.  As part of our continuing mission to provide you with exceptional heart care, we have created designated Provider Care Teams.  These Care Teams include your primary Cardiologist (physician) and Advanced Practice Providers (APPs -  Physician Assistants and Nurse Practitioners) who all work together to provide you with the care you need, when you need it.  We recommend signing up for the patient portal called "MyChart".  Sign up information is provided on this After Visit Summary.  MyChart is used to connect with patients for Virtual Visits (Telemedicine).  Patients are able to view lab/test results, encounter notes, upcoming appointments, etc.  Non-urgent messages can be sent to your provider as well.   To learn more about what you can do with MyChart, go to [email or web address].    Your next appointment:   6 month(s)  The format for your next appointment:   In Person  Provider:   Reatha HarpsWesley T O'Neal, MD     Other Instructions Referral to EP- they will contact you for an appointment.   Wear compression stockings, and drink water.     Time Spent with  Patient: I have spent a total of 35 minutes with patient reviewing hospital notes, telemetry, EKGs, labs and examining the patient as well as establishing an assessment and plan that was discussed with the patient.  > 50% of time was spent in direct patient care.  Signed, Lenna GilfordWesley T. Flora Lipps'Neal, MD, [facility] [place]   [facility]  [address], [address] [place], [place] [address] [phone]  09/02/2021 3:44 PM

## 2021-09-02 ENCOUNTER — Ambulatory Visit: Payer: Medicare Other | Admitting: Cardiovascular Disease

## 2021-09-02 ENCOUNTER — Encounter: Payer: Self-pay | Admitting: Cardiovascular Disease

## 2021-09-02 ENCOUNTER — Other Ambulatory Visit: Payer: Self-pay

## 2021-09-02 ENCOUNTER — Ambulatory Visit: Payer: Medicare Other | Admitting: General Practice

## 2021-09-02 VITALS — BP 132/60 | HR 66 | Ht 73.0 in | Wt 170.8 lb

## 2021-09-02 DIAGNOSIS — E782 Mixed hyperlipidemia: Secondary | ICD-10-CM

## 2021-09-02 DIAGNOSIS — K551 Chronic vascular disorders of intestine: Secondary | ICD-10-CM | POA: Diagnosis not present

## 2021-09-02 DIAGNOSIS — R55 Syncope and collapse: Secondary | ICD-10-CM

## 2021-09-02 DIAGNOSIS — I455 Other specified heart block: Secondary | ICD-10-CM | POA: Diagnosis not present

## 2021-09-02 DIAGNOSIS — G903 Multi-system degeneration of the autonomic nervous system: Secondary | ICD-10-CM

## 2021-09-02 DIAGNOSIS — I6522 Occlusion and stenosis of left carotid artery: Secondary | ICD-10-CM

## 2021-09-02 NOTE — Patient Instructions (Signed)
Medication Instructions:  The current medical regimen is effective;  continue present plan and medications.  *If you need a refill on your cardiac medications before your next appointment, please call your pharmacy*   Follow-Up: At [facility], you and your health needs are our priority.  As part of our continuing mission to provide you with exceptional heart care, we have created designated Provider Care Teams.  These Care Teams include your primary Cardiologist (physician) and Advanced Practice Providers (APPs -  Physician Assistants and Nurse Practitioners) who all work together to provide you with the care you need, when you need it.  We recommend signing up for the patient portal called "MyChart".  Sign up information is provided on this After Visit Summary.  MyChart is used to connect with patients for Virtual Visits (Telemedicine).  Patients are able to view lab/test results, encounter notes, upcoming appointments, etc.  Non-urgent messages can be sent to your provider as well.   To learn more about what you can do with MyChart, go to [email or web address].    Your next appointment:   6 month(s)  The format for your next appointment:   In Person  Provider:   [name], MD     Other Instructions Referral to EP- they will contact you for an appointment.   Wear compression stockings, and drink water.

## 2021-09-06 ENCOUNTER — Ambulatory Visit
Admission: RE | Admit: 2021-09-06 | Discharge: 2021-09-06 | Disposition: A | Discharge status: Home / Self Care | Payer: Medicare Other | Source: Ambulatory Visit | Attending: Family Medicine | Admitting: Family Medicine

## 2021-09-06 DIAGNOSIS — M5416 Radiculopathy, lumbar region: Secondary | ICD-10-CM

## 2021-09-06 IMAGING — MR MR LUMBAR SPINE WO/W CM
4 of 7 series · 19 of 48 positions shown · IV contrast (15 ml multihance)
Comparison: None.

CLINICAL DATA: Lumbar radiculopathy

EXAM:
MRI LUMBAR SPINE WITHOUT AND WITH CONTRAST
TECHNIQUE: Multiplanar and multiecho pulse sequences of the lumbar spine were
obtained without and with intravenous contrast.
CONTRAST:  15mL MULTIHANCE GADOBENATE DIMEGLUMINE 529 MG/ML IV SOLN

[Series 5: T1 · sagittal · 4.0mm · 0.73mm/px · 3 of 15 slices shown (1 of 2)]
[im 1/15]
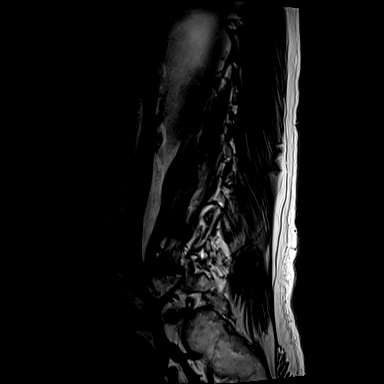
[im 8/15]
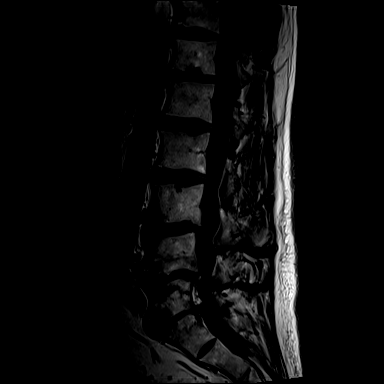
[im 15/15]
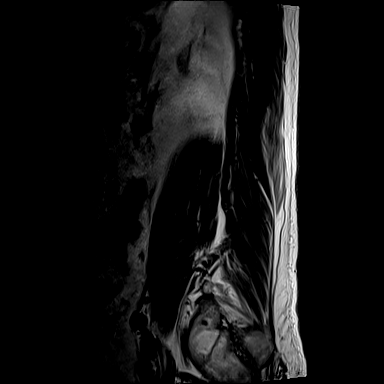

[Series 9: T1 · axial · 4.0mm · 0.28mm/px · z∈[-66,+111]mm · 3 of 39 slices shown (2 of 2)]
[im 4/39]
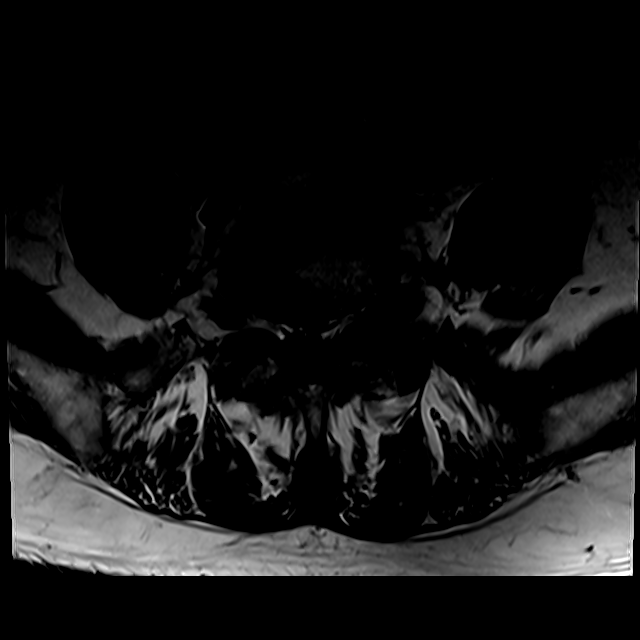
[im 20/39]
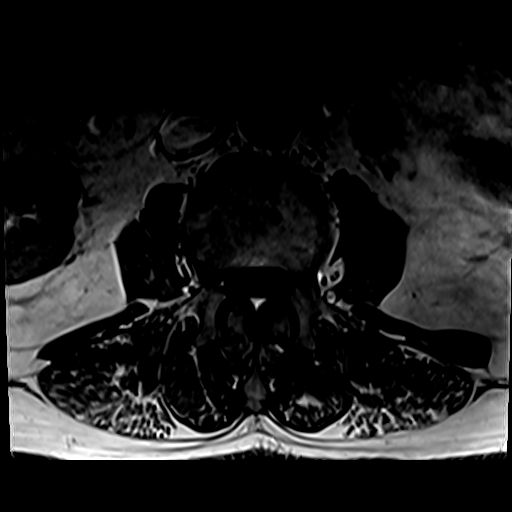
[im 35/39]
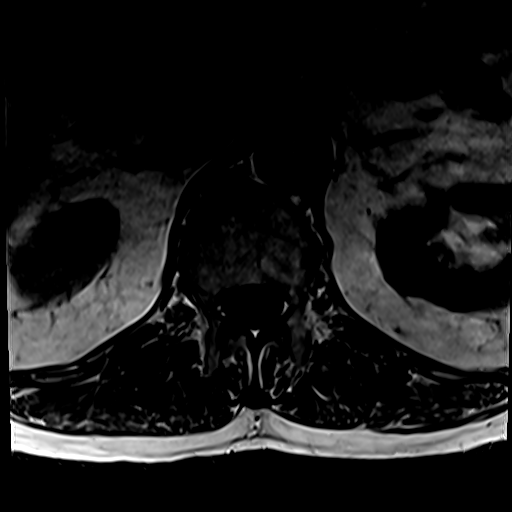

[Series 12: T2 · axial · 4.0mm · 0.28mm/px · z∈[-80,+111]mm · 10 of 39 slices shown (1 of 2)]
[im 1/39]
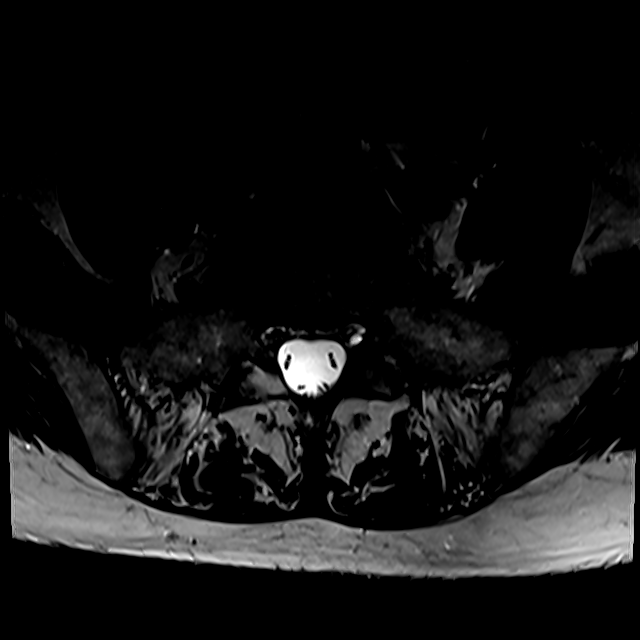
[im 4/39]
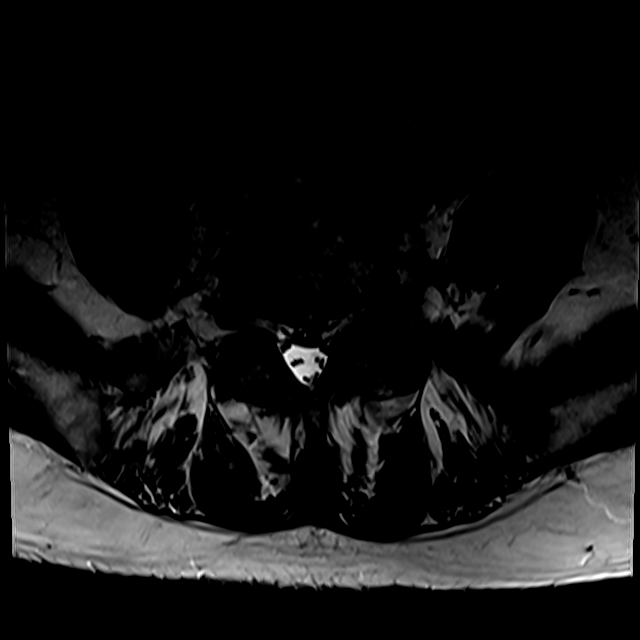
[im 8/39]
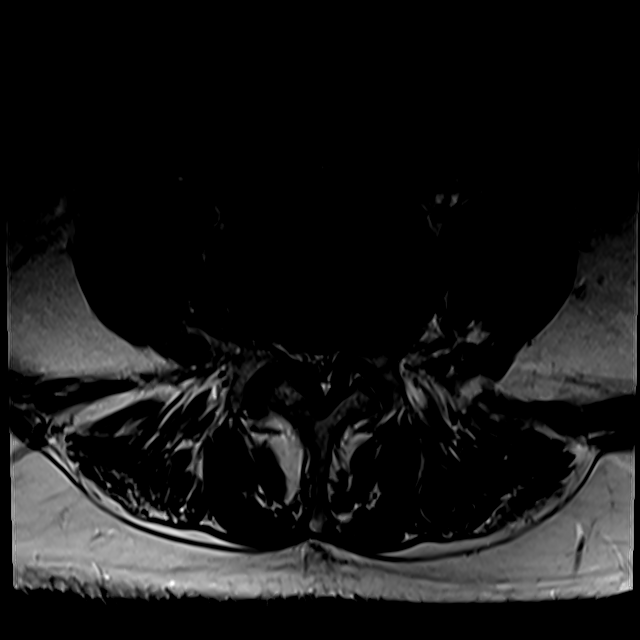
[im 12/39]
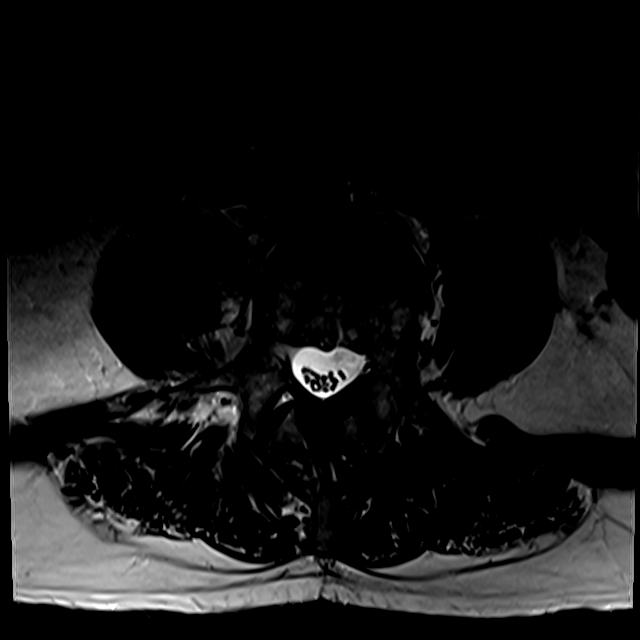
[im 16/39]
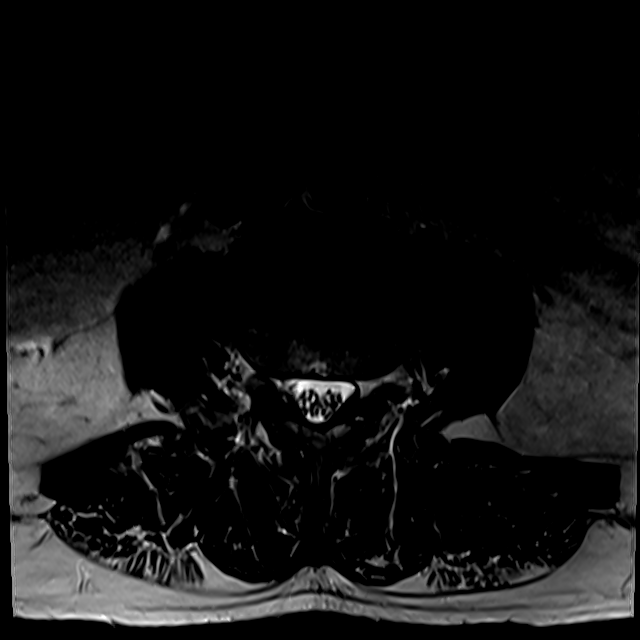
[im 20/39]
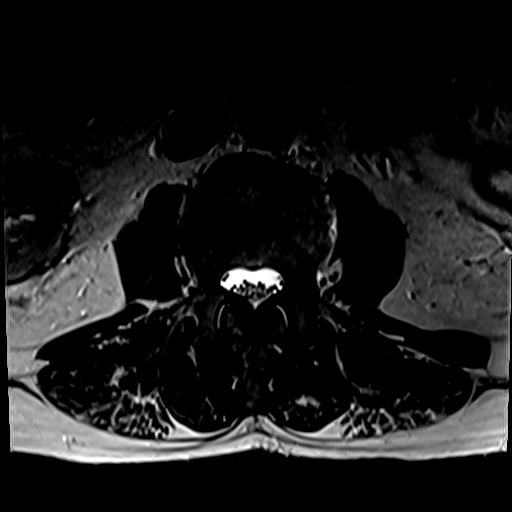
[im 23/39]
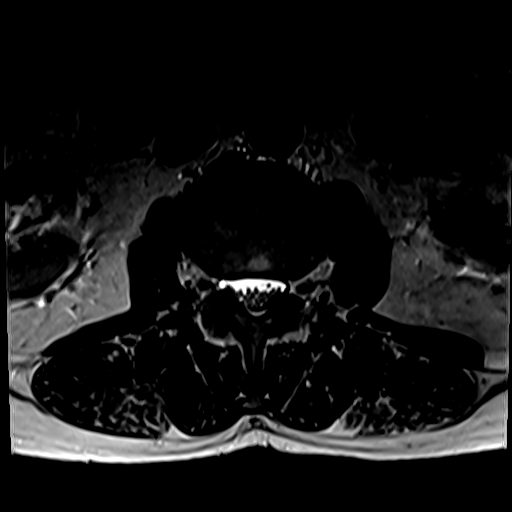
[im 27/39]
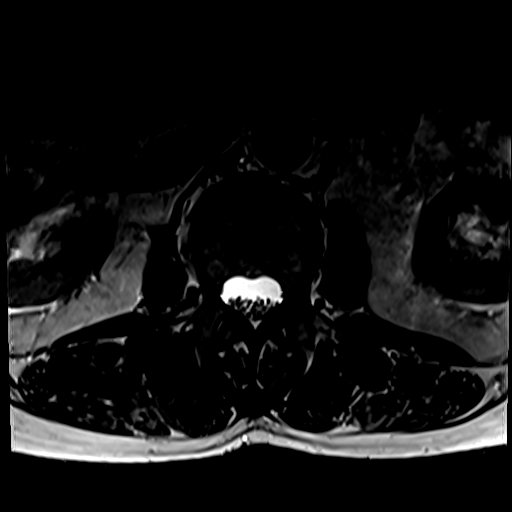
[im 31/39]
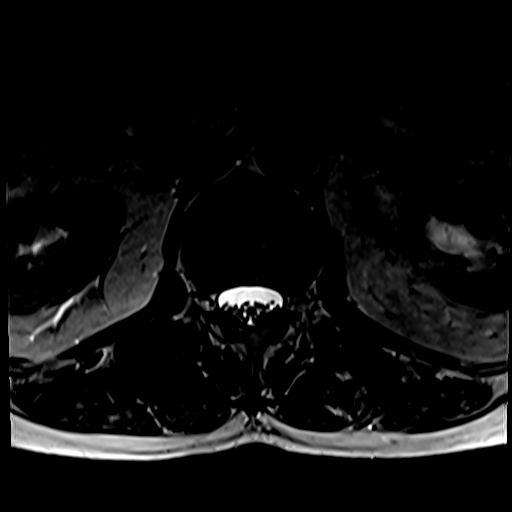
[im 35/39]
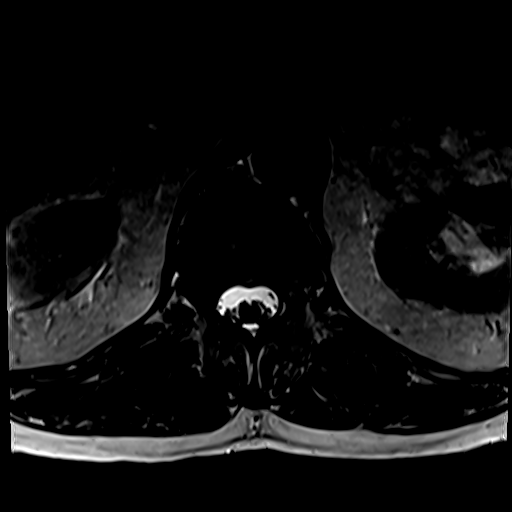

[Series 13: T2 · sagittal · 4.0mm · 0.73mm/px · 3 of 15 slices shown (2 of 2)]
[im 1/15]
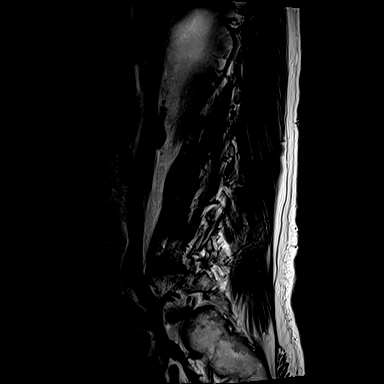
[im 10/15]
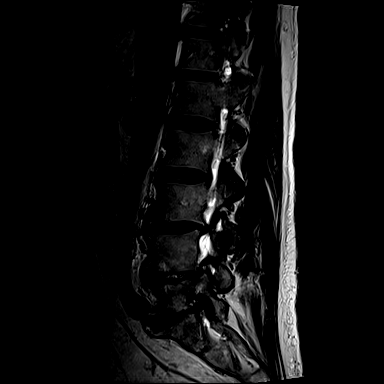
[im 15/15]
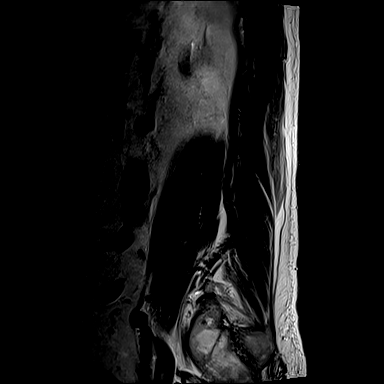

[19 of 48 positions shown; findings below may reference images not displayed]

FINDINGS: Segmentation:  Standard.

Alignment: Mild retrolisthesis at lower lumbar levels.
Levocurvature.

Vertebrae: Degenerative endplate irregularity. There is degenerative
endplate marrow edema at the opposing endplates from L3-L4 to L5-S1.
No suspicious osseous lesion.

Conus medullaris and cauda equina: Conus extends to the L1-L2 level.
Conus and cauda equina appear normal. No abnormal intrathecal
enhancement.

Paraspinal and other soft tissues: Unremarkable.

Disc levels:

L1-L2:  No canal or foraminal stenosis.

L2-L3: Minimal disc bulge. Minor facet arthropathy. Minor canal
stenosis. No foraminal stenosis.

L3-L4: Disc bulge with endplate osteophytic ridging slightly
eccentric to the left. Facet arthropathy with ligamentum flavum
infolding. Mild to moderate canal stenosis. Mild foraminal stenosis,
right greater than left.

L4-L5: Disc bulge with endplate osteophytic ridging. Facet
arthropathy with ligamentum flavum infolding. Moderate to marked
canal stenosis. Narrowing of left greater than right subarticular
recesses. Mild to moderate foraminal stenosis.

L5-S1: Disc bulge with endplate osteophytic ridging slightly
eccentric to the left. Facet arthropathy with ligamentum flavum
infolding. Minor canal stenosis. Narrowing of the subarticular
recesses. Mild right and marked left foraminal stenosis.
IMPRESSION: Multilevel, primarily lower lumbar degenerative changes as detailed
above. Canal stenosis is greatest at L4-L5. Foraminal stenosis is
greatest on the left at L5-S1.

## 2021-09-06 MED ORDER — GADOBENATE DIMEGLUMINE 529 MG/ML IV SOLN
15.0000 mL | Freq: Once | INTRAVENOUS | Status: AC | PRN
  Administered 2021-09-06: 15 mL via INTRAVENOUS

## 2021-09-07 ENCOUNTER — Ambulatory Visit: Payer: Medicare Other | Admitting: Gastroenterology

## 2021-09-07 ENCOUNTER — Encounter: Payer: Self-pay | Admitting: Gastroenterology

## 2021-09-07 VITALS — BP 144/72 | HR 75 | Ht 73.0 in | Wt 171.8 lb

## 2021-09-07 DIAGNOSIS — K5900 Constipation, unspecified: Secondary | ICD-10-CM

## 2021-09-07 DIAGNOSIS — D649 Anemia, unspecified: Secondary | ICD-10-CM

## 2021-09-07 DIAGNOSIS — Z1211 Encounter for screening for malignant neoplasm of colon: Secondary | ICD-10-CM | POA: Diagnosis not present

## 2021-09-07 DIAGNOSIS — K219 Gastro-esophageal reflux disease without esophagitis: Secondary | ICD-10-CM

## 2021-09-07 MED ORDER — SUCRALFATE 1 G PO TABS: 1.0000 g | ORAL_TABLET | Freq: Two times a day (BID) | ORAL | 2 refills | Status: DC

## 2021-09-07 MED ORDER — PANTOPRAZOLE SODIUM 40 MG PO TBEC: 40.0000 mg | DELAYED_RELEASE_TABLET | Freq: Every day | ORAL | 4 refills | Status: DC

## 2021-09-07 NOTE — Progress Notes (Signed)
[place] VISIT   Primary Care Provider Terry Rasmussen, MD [place] [address] [phone]  Patient Profile: Terry [name] is a 76 y.o. male with a pmh significant for Parkinson's disease, CAD, status post prior CVA, carotid artery disease (status post endarterectomy), hypertension, hyperlipidemia, renal artery stenosis, GERD, previous IDA (improved after iron supplementation).  The patient presents to the [facility] for an evaluation and management of problem(s) noted below:  Problem List 1. Anemia, unspecified type   2. Gastroesophageal reflux disease, unspecified whether esophagitis present   3. Colon cancer screening   4. Constipation, unspecified constipation type     History of Present Illness Please see prior notes for full details of HPI.  Interval History The patient returns for follow-up today.  He has been feeling much better with his PPI therapy as well as Carafate though he has not had Carafate for a few weeks now.  He denies any significant dysphagia symptoms and he is eating as he likes.  Most recent blood counts had shown improvement and he was no longer anemic.  He is interested in an upper and lower endoscopy but is not sure from a health standpoint that he wants to undergo that as of yet.  He denies significant abdominal pain.  He is still experiencing issues of dyspnea and fatigue and will be seeing his primary care provider in the coming weeks.  GI Review of Systems Positive as above Negative for odynophagia, nausea, vomiting, melena, hematochezia  Review of Systems General: Denies fevers/chills/weight loss unintentionally Cardiovascular: Denies chest pain Pulmonary: Denies shortness of breath Gastroenterological: See HPI Genitourinary: Denies darkened urine or hematuria Hematological: Denies easy bruising/bleeding Dermatological: Denies jaundice Psychological: Mood is  stable   Medications Current Outpatient Medications  Medication Sig Dispense Refill   acetaminophen (TYLENOL) 325 MG tablet Take 2 tablets (650 mg total) by mouth every 6 (six) hours as needed. 36 tablet 0   acetaminophen (TYLENOL) 500 MG tablet Take 500 mg by mouth every 6 (six) hours as needed for moderate pain or headache.     amLODipine (NORVASC) 2.5 MG tablet Take 2.5 mg by mouth daily.     aspirin EC 81 MG EC tablet Take 1 tablet (81 mg total) by mouth daily. Swallow whole. 30 tablet 1   atorvastatin (LIPITOR) 20 MG tablet TAKE ONE TABLET BY MOUTH EVERY NIGHT AT BEDTIME (Patient taking differently: Take 20 mg by mouth every evening.) 90 tablet 0   carbidopa-levodopa (SINEMET CR) 50-200 MG tablet Take 1 tablet by mouth at bedtime. 90 tablet 1   carbidopa-levodopa (SINEMET IR) 25-100 MG tablet Take 2.5 tablets by mouth 4 (four) times daily. 7am, 12pm, 5pm, 10pm     cetirizine (ZYRTEC) 10 MG tablet Take 10 mg by mouth as needed for allergies.      fluticasone (FLONASE) 50 MCG/ACT nasal spray Place 1 spray into both nostrils daily as needed for allergies.      meloxicam (MOBIC) 15 MG tablet Take 15 mg by mouth at bedtime as needed for pain.     NEUPRO 4 MG/24HR Place 1 patch onto the skin daily.     tiZANidine (ZANAFLEX) 2 MG tablet Take 2 mg by mouth daily as needed for muscle spasms.     traZODone (DESYREL) 50 MG tablet Take 1 tablet (50 mg total) by mouth at bedtime as needed for sleep. 90 tablet 0   valsartan (DIOVAN) 80 MG tablet Take 80 mg by mouth daily.  nitroGLYCERIN (NITROSTAT) 0.4 MG SL tablet Place 1 tablet (0.4 mg total) under the tongue every 5 (five) minutes as needed for chest pain. 90 tablet 3   pantoprazole (PROTONIX) 40 MG tablet Take 1 tablet (40 mg total) by mouth daily. 90 tablet 4   sucralfate (CARAFATE) 1 g tablet Take 1 tablet (1 g total) by mouth 2 (two) times daily for 7 days. 60 tablet 2   No current facility-administered medications for this visit.     Allergies Allergies  Allergen Reactions   Penicillins Rash   Erythromycin Rash   Clam Shell Nausea And Vomiting    Histories Past Medical History:  Diagnosis Date   Carotid artery stenosis 04/29/2018   Chronic tension headaches 04/29/2018   Essential hypertension 04/29/2018   GERD (gastroesophageal reflux disease) 04/29/2018   HLD (hyperlipidemia) 04/29/2018   Parkinson's disease ([name]) 04/29/2018   Past Surgical History:  Procedure Laterality Date   ENDARTERECTOMY Left 04/27/2021   Procedure: LEFT CAROTID ENDARTERECTOMY;  Surgeon: Terry Mitchell, MD;  Location: [facility] OR;  Service: Vascular;  Laterality: Left;   LEFT HEART CATH AND CORONARY ANGIOGRAPHY N/A 11/25/2019   Procedure: LEFT HEART CATH AND CORONARY ANGIOGRAPHY;  Surgeon: Terry Crome, MD;  Location: [place] CV LAB;  Service: Cardiovascular;  Laterality: N/A;   PATCH ANGIOPLASTY Left 04/27/2021   Procedure: PATCH ANGIOPLASTY USING Rueben Bash BIOLOGIC PATCH;  Surgeon: Terry Mitchell, MD;  Location: [facility] OR;  Service: Vascular;  Laterality: Left;   TONSILLECTOMY     Social History   Socioeconomic History   Marital status: Married    Spouse name: Not on file   Number of children: Not on file   Years of education: Not on file   Highest education level: Not on file  Occupational History   Not on file  Tobacco Use   Smoking status: Never    Passive exposure: Never   Smokeless tobacco: Current    Types: Snuff  Vaping Use   Vaping Use: Never used  Substance and Sexual Activity   Alcohol use: Yes    Comment: Occasionally.   Drug use: Never   Sexual activity: Not Currently  Other Topics Concern   Not on file  Social History Narrative   Not on file   Social Determinants of Health   Financial Resource Strain: Not on file  Food Insecurity: Not on file  Transportation Needs: Not on file  Physical Activity: Not on file  Stress: Not on file  Social Connections: Not on file  Intimate Partner Violence: Not on file    Family History  Problem Relation Age of Onset   Heart disease Mother    Cancer Father    Colon cancer Neg Hx    Stomach cancer Neg Hx    Esophageal cancer Neg Hx    Inflammatory bowel disease Neg Hx    Liver disease Neg Hx    Pancreatic cancer Neg Hx    Rectal cancer Neg Hx    I have reviewed his medical, social, and family history in detail and updated the electronic medical record as necessary.    PHYSICAL EXAMINATION  BP (!) 144/72    Pulse 75    Ht 6\' 1"  (1.854 m)    Wt 171 lb 12.8 oz (77.9 kg)    SpO2 97%    BMI 22.67 kg/m  Wt Readings from Last 3 Encounters:  09/07/21 171 lb 12.8 oz (77.9 kg)  09/02/21 170 lb 12.8 oz (77.5 kg)  08/15/21 170  lb (77.1 kg)  GEN: NAD, appears older than stated age, doesn't appear chronically ill PSYCH: Cooperative, without pressured speech EYE: Conjunctivae pink, sclerae anicteric ENT: MMM CV: Nontachycardic RESP: No audible wheezing GI: NABS, soft, NT/ND, without rebound MSK/EXT: No lower extremity edema SKIN: No jaundice NEURO:  Alert & Oriented x 3, no focal deficits's   REVIEW OF DATA  I reviewed the following data at the time of this encounter:  GI Procedures and Studies  No new imaging studies to review  Laboratory Studies  Reviewed those in epic  Imaging Studies  December 2022 CT angio abdomen/pelvis IMPRESSION: No acute CT finding. Aortic and mild mesenteric arterial disease, with the CT angiogram negative for any findings that would support acute mesenteric ischemia. Aortic Atherosclerosis (ICD10-I70.0).  Mild renal arterial disease.   ASSESSMENT  Mr. Bendolph is a 76 y.o. male with a pmh significant for Parkinson's disease, CAD, status post prior CVA, carotid artery disease (status post endarterectomy), hypertension, hyperlipidemia, renal artery stenosis, GERD, previous IDA (improved after iron supplementation).  The patient is seen today for evaluation and management of:  1. Anemia, unspecified type   2.  Gastroesophageal reflux disease, unspecified whether esophagitis present   3. Colon cancer screening   4. Constipation, unspecified constipation type    The patient is hemodynamically stable at this time.  It seems that his episodes of syncope and presyncope may likely be vagally mediated based on review of the chart and cardiology evaluation.  His Parkinson's disease may be causing these issues to occur.  From our standpoint, I do think he still has an indication to at least consider an upper endoscopy in the setting of his new onset symptoms that he had experienced, though being on therapy has helped.  Whenever a person over the age of 24 has new onset GERD symptoms rule out of underlying mass/lesion should be strongly considered.  If after he discusses things with his PCP is felt that he is not a candidate for evaluation or if he decides not to undergo endoscopy, then I would recommend at least a barium swallow being performed.  I think he is still healthy enough to have 1 last colonoscopy for screening purposes though again at his age we certainly want to be mindful.  Thankfully his constipation issues are no longer an issue and he is doing relatively well.  He has not had significant iron deficiency persistent his blood counts are stable.  As such, I do think it is okay for [place] to wait a few more weeks for him to discuss things further with his PCP whom he trusts greatly.  If there are no contraindications we will see him back in a few weeks time and then decide about endoscopic evaluation.  I would perform his procedures in the hospital-based setting but we can see where things stand.  If we decide not to do a colonoscopy I would recommend at least a Cologuard test at that point.  All patient questions were answered to the best of my ability, and the patient agrees to the aforementioned plan of action with follow-up as indicated.    PLAN  Continue PPI daily (30 minutes before breakfast or 30 minutes  before dinner) Continue Carafate twice daily as needed Continue fiber supplementation and high-fiber diet Strongly consider endoscopy - If patient defers on this or PCP feels not stable, then recommend at least a barium swallow Colonoscopy for final screening is reasonable - If patient defers on this or PCP feels not  stable, then consider at least a Cologuard Follow-up in clinic in 6 to 8 weeks and make decisions from there   No orders of the defined types were placed in this encounter.   New Prescriptions   No medications on file   Modified Medications   Modified Medication Previous Medication   PANTOPRAZOLE (PROTONIX) 40 MG TABLET pantoprazole (PROTONIX) 40 MG tablet      Take 1 tablet (40 mg total) by mouth daily.    Take 40 mg by mouth daily.   SUCRALFATE (CARAFATE) 1 G TABLET sucralfate (CARAFATE) 1 g tablet      Take 1 tablet (1 g total) by mouth 2 (two) times daily for 7 days.    Take 1 tablet (1 g total) by mouth 4 (four) times daily -  with meals and at bedtime for 7 days.    Planned Follow Up No follow-ups on file.   Total Time in Face-to-Face and in Coordination of Care for patient including independent/personal interpretation/review of prior testing, medical history, examination, medication adjustment, communicating results with the patient directly, and documentation within the EHR is 25 minutes.   Justice Britain, MD [facility] Office # [phone]

## 2021-09-07 NOTE — Patient Instructions (Addendum)
We have sent the following medications to your pharmacy for you to pick up at your convenience: Carafate and Pantoprazole.   We will see you back in April to discuss decision about endoscopies.   Please keep follow-up on : 11/09/21 at 1:30pm  If you are age 76 or older, your body mass index should be between 23-30. Your Body mass index is 22.67 kg/m. If this is out of the aforementioned range listed, please consider follow up with your Primary Care Provider.  If you are age 59 or younger, your body mass index should be between 19-25. Your Body mass index is 22.67 kg/m. If this is out of the aformentioned range listed, please consider follow up with your Primary Care Provider.   ________________________________________________________  The [facility] providers would like to encourage you to use [facility] to communicate with providers for non-urgent requests or questions.  Due to long hold times on the telephone, sending your provider a message by [facility] may be a faster and more efficient way to get a response.  Please allow 48 business hours for a response.  Please remember that this is for non-urgent requests.  _______________________________________________________  Thank you for choosing me and [facility].  Dr. Rush Landmark.

## 2021-09-08 ENCOUNTER — Encounter: Payer: Self-pay | Admitting: Cardiovascular Disease

## 2021-09-09 DIAGNOSIS — K5900 Constipation, unspecified: Secondary | ICD-10-CM | POA: Insufficient documentation

## 2021-09-09 DIAGNOSIS — D649 Anemia, unspecified: Secondary | ICD-10-CM | POA: Insufficient documentation

## 2021-09-09 DIAGNOSIS — K219 Gastro-esophageal reflux disease without esophagitis: Secondary | ICD-10-CM | POA: Insufficient documentation

## 2021-10-24 ENCOUNTER — Ambulatory Visit: Payer: Medicare Other | Admitting: Neurology

## 2021-11-02 ENCOUNTER — Encounter: Payer: Self-pay | Admitting: Neurology

## 2021-11-02 ENCOUNTER — Ambulatory Visit: Payer: Medicare Other | Admitting: Neurology

## 2021-11-02 VITALS — BP 136/64 | HR 62 | Ht 72.0 in | Wt 174.8 lb

## 2021-11-02 DIAGNOSIS — I951 Orthostatic hypotension: Secondary | ICD-10-CM | POA: Diagnosis not present

## 2021-11-02 DIAGNOSIS — G20 Parkinson's disease: Secondary | ICD-10-CM | POA: Diagnosis not present

## 2021-11-02 DIAGNOSIS — K5909 Other constipation: Secondary | ICD-10-CM

## 2021-11-02 NOTE — Patient Instructions (Signed)
It was nice to meet you both today.  As discussed, we will scale back on your individual levodopa dose to 2 pills 4 times a day.  We can increase the frequency to 5 times a day if the need arises, you can continue with your Sinemet long-acting at bedtime and your Neupro patch for now.  Please follow-up routinely in 6 months, sooner if needed.  Please keep [place] posted via [employer]. ?

## 2021-11-02 NOTE — Progress Notes (Signed)
Subjective:  ?  ?Patient ID: Terry GasserJerry [name] is a 76 y.o. male. ? ?HPI ? ? ? ?History:  ? ?Dear Dr. Hal [name],  ? ?I saw your patient, Terry [name], upon your kind request in neurologic clinic today for initial consultation of his Parkinson's disease, to establish care.  The patient is accompanied by his wife today.  As you know, Terry [name] is a 76 year old left-handed gentleman with an underlying medical history of hypertension, reflux disease, hyperlipidemia, stroke, carotid artery stenosis with status post left carotid endarterectomy in October 2022, tension headaches, and Parkinson's disease, who was diagnosed with Parkinson's disease in 2011 or thereabouts.  Symptoms started with tremor, symptoms started on the left side.  He has had falls, he has chronic constipation, he has had orthostatic hypotension.  He had followed with Dr. Rubin [name] in the past and then moved [place] [facility] [place] where he saw different neurologist.  He recently reestablished with Dr. Rubin [name] and was last seen on 06/02/2021 by Terry CallHa Thu Vo Green, PA, and I reviewed the office note.  He had concerns of falls and dream enactment behavior as well as visual hallucinations.  In addition, he has had orthostatic hypotension.  He has been followed by cardiology.  He is on Sinemet 25-100 mg strength 2-1/2 pills 4 times daily, Sinemet CR 50-200 mg strength 1 tablet at night and Neupro patch 4 mg daily.  He is on several other medications, currently takes trazodone at night as well as mirtazapine.  He is also on blood pressure medications he was advised to stay well-hydrated and use compression stockings in the lower extremities. ?Patient had a MR angiogram of the head and neck with and without contrast on 04/24/2021 and I reviewed the results: IMPRESSION: ?MRA HEAD IMPRESSION: ?  ?Normal intracranial MRA. No large vessel occlusion, hemodynamically ?significant stenosis, or other acute vascular abnormality. ?  ?MRA NECK IMPRESSION: ?  ?1. Atheromatous change  about the left carotid bulb/proximal left ICA ?with associated short-segment severe stenosis of up to 80% by NASCET ?criteria. ?2. Atheromatous change about the right carotid bulb/proximal right ?ICA with associated stenosis of up to 40% by NASCET criteria. ?3. Wide patency of both vertebral arteries within the neck. ? ?I was also able to review prior neurology records from his office visits with Terry [name] with [facility] [place].  He had received Myocloc injections for sialorrhea with him. ? ?He was last seen by Terry [name] on 09/07/2020, at which time he was on Sinemet IR 2-1/2 pills 4 times a day.  He reported a few falls.  He had sustained a right patellar fracture in January 2022. 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 ? ?He tries to stay active.  He lives with his wife, tries to hydrate well with water, but could do better.  He about a month ago.  He has been fairly consistently trying to hydrate better and use his compression socks which are working well for him.  He has had intermittent constipation, uses Metamucil and also as needed glycerin suppositories as an appetite is better.  He has had weight loss in the recent past but his weight has stabilized.  He has trouble sleeping, takes trazodone 50 mg at bedtime, is no longer on Remeron, stopped due to weight gain.  He takes his Sinemet at 7 AM, 12, 5 PM and 10 PM daily.  He drinks caffeine in the form of coffee, 1 cup in the morning, occasional soda, rare  alcohol, he is a non-smoker. ? ?His Past Medical History Is Significant For: ?Past Medical History:  ?Diagnosis Date  ? Carotid artery stenosis 04/29/2018  ? Chronic tension headaches 04/29/2018  ? Essential hypertension 04/29/2018  ? GERD (gastroesophageal reflux disease) 04/29/2018  ? HLD (hyperlipidemia) 04/29/2018  ? Parkinson's disease ([facility]) 04/29/2018  ? ? ?His Past Surgical History Is Significant For: ?Past Surgical History:   ?Procedure Laterality Date  ? ENDARTERECTOMY Left 04/27/2021  ? Procedure: LEFT CAROTID ENDARTERECTOMY;  Surgeon: Terry Libman, MD;  Location: [facility] OR;  Service: Vascular;  Laterality: Left;  ? LEFT HEART CATH AND CORONARY ANGIOGRAPHY N/A 11/25/2019  ? Procedure: LEFT HEART CATH AND CORONARY ANGIOGRAPHY;  Surgeon: Lyn Records, MD;  Location: [facility] INVASIVE CV LAB;  Service: Cardiovascular;  Laterality: N/A;  ? PATCH ANGIOPLASTY Left 04/27/2021  ? Procedure: PATCH ANGIOPLASTY USING Livia Snellen BIOLOGIC PATCH;  Surgeon: Terry Libman, MD;  Location: [facility] OR;  Service: Vascular;  Laterality: Left;  ? TONSILLECTOMY    ? ? ?His Family History Is Significant For: ?Family History  ?Problem Relation Age of Onset  ? Heart disease Mother   ? Cancer Father   ? Parkinson's disease Sister   ? Colon cancer Neg Hx   ? Stomach cancer Neg Hx   ? Esophageal cancer Neg Hx   ? Inflammatory bowel disease Neg Hx   ? Liver disease Neg Hx   ? Pancreatic cancer Neg Hx   ? Rectal cancer Neg Hx   ? ? ?His Social History Is Significant For: ?Social History  ? ?Socioeconomic History  ? Marital status: Married  ?  Spouse name: Not on file  ? Number of children: Not on file  ? Years of education: Not on file  ? Highest education level: Not on file  ?Occupational History  ? Not on file  ?Tobacco Use  ? Smoking status: Never  ?  Passive exposure: Never  ? Smokeless tobacco: Former  ?  Types: Snuff  ?Vaping Use  ? Vaping Use: Never used  ?Substance and Sexual Activity  ? Alcohol use: Yes  ?  Comment: Occasionally.  ? Drug use: Never  ? Sexual activity: Not Currently  ?Other Topics Concern  ? Not on file  ?Social History Narrative  ? Not on file  ? ?Social Determinants of Health  ? ?Financial Resource Strain: Not on file  ?Food Insecurity: Not on file  ?Transportation Needs: Not on file  ?Physical Activity: Not on file  ?Stress: Not on file  ?Social Connections: Not on file  ? ? ?His Allergies Are:  ?Allergies  ?Allergen Reactions  ? Penicillins Rash  ?  Erythromycin Rash  ? Clam Shell Nausea And Vomiting  ?:  ? ?His Current Medications Are:  ?Outpatient Encounter Medications as of 11/02/2021  ?Medication Sig  ? acetaminophen (TYLENOL) 325 MG tablet Take 2 tablets (650 mg total) by mouth every 6 (six) hours as needed.  ? acetaminophen (TYLENOL) 500 MG tablet Take 500 mg by mouth every 6 (six) hours as needed for moderate pain or headache.  ? amLODipine (NORVASC) 2.5 MG tablet Take 2.5 mg by mouth daily.  ? aspirin EC 81 MG EC tablet Take 1 tablet (81 mg total) by mouth daily. Swallow whole.  ? atorvastatin (LIPITOR) 20 MG tablet TAKE ONE TABLET BY MOUTH EVERY NIGHT AT BEDTIME (Patient taking differently: Take 20 mg by mouth every evening.)  ? carbidopa-levodopa (SINEMET CR) 50-200 MG tablet Take 1 tablet by mouth at bedtime.  ?  carbidopa-levodopa (SINEMET IR) 25-100 MG tablet Take 2.5 tablets by mouth 4 (four) times daily. 7am, 12pm, 5pm, 10pm  ? cetirizine (ZYRTEC) 10 MG tablet Take 10 mg by mouth as needed for allergies.   ? fluticasone (FLONASE) 50 MCG/ACT nasal spray Place 1 spray into both nostrils daily as needed for allergies.   ? meloxicam (MOBIC) 15 MG tablet Take 15 mg by mouth at bedtime as needed for pain.  ? NEUPRO 4 MG/24HR Place 1 patch onto the skin daily.  ? pantoprazole (PROTONIX) 40 MG tablet Take 1 tablet (40 mg total) by mouth daily.  ? tiZANidine (ZANAFLEX) 2 MG tablet Take 2 mg by mouth daily as needed for muscle spasms.  ? traZODone (DESYREL) 50 MG tablet Take 1 tablet (50 mg total) by mouth at bedtime as needed for sleep.  ? valsartan (DIOVAN) 80 MG tablet Take 80 mg by mouth daily.  ? nitroGLYCERIN (NITROSTAT) 0.4 MG SL tablet Place 1 tablet (0.4 mg total) under the tongue every 5 (five) minutes as needed for chest pain.  ? sucralfate (CARAFATE) 1 g tablet Take 1 tablet (1 g total) by mouth 2 (two) times daily for 7 days.  ? ?No facility-administered encounter medications on file as of 11/02/2021.  ?: ? ?Review of Systems:  ?Out of a complete  14 point review of systems, all are reviewed and negative with the exception of these symptoms as listed below: ? ?Review of Systems  ?Neurological:   ?     Pt is here for Parkinson disease . Pt sta

## 2021-11-03 ENCOUNTER — Encounter: Payer: Self-pay | Admitting: Cardiology

## 2021-11-04 ENCOUNTER — Encounter: Payer: Self-pay | Admitting: Neurology

## 2021-11-09 ENCOUNTER — Ambulatory Visit (INDEPENDENT_AMBULATORY_CARE_PROVIDER_SITE_OTHER): Payer: Medicare Other | Admitting: Gastroenterology

## 2021-11-09 ENCOUNTER — Encounter: Payer: Self-pay | Admitting: Gastroenterology

## 2021-11-09 VITALS — BP 130/50 | HR 56 | Ht 72.0 in | Wt 172.5 lb

## 2021-11-09 DIAGNOSIS — Z8639 Personal history of other endocrine, nutritional and metabolic disease: Secondary | ICD-10-CM | POA: Diagnosis not present

## 2021-11-09 DIAGNOSIS — R1030 Lower abdominal pain, unspecified: Secondary | ICD-10-CM | POA: Diagnosis not present

## 2021-11-09 DIAGNOSIS — K582 Mixed irritable bowel syndrome: Secondary | ICD-10-CM

## 2021-11-09 DIAGNOSIS — R194 Change in bowel habit: Secondary | ICD-10-CM

## 2021-11-09 DIAGNOSIS — K219 Gastro-esophageal reflux disease without esophagitis: Secondary | ICD-10-CM

## 2021-11-09 MED ORDER — NA SULFATE-K SULFATE-MG SULF 17.5-3.13-1.6 GM/177ML PO SOLN: 1.0000 | ORAL | 0 refills | Status: DC

## 2021-11-09 NOTE — Patient Instructions (Addendum)
You have been scheduled for an endoscopy and colonoscopy. Please follow the written instructions given to you at your visit today. ?Please pick up your prep supplies at the pharmacy within the next 1-3 days. ?If you use inhalers (even only as needed), please bring them with you on the day of your procedure. ? ?We have sent the following medications to your pharmacy for you to pick up at your convenience: ?Suprep  ? ?Continue: ?A high fiber diet with plenty of fluids (up to 8 glasses of water daily) is suggested to relieve these symptoms.  Metamucil, 1 tablespoon once or twice daily can be used to keep bowels regular if needed. ? ?Trial of Linzess - Take one capsule by mouth once daily. If Linzess works, then please let us know and will send in prescription to pharmacy.  ? ?Thank you for choosing me and [facility]. ? ?Dr. Meridee Score ? ?

## 2021-11-09 NOTE — Progress Notes (Signed)
? ?GASTROENTEROLOGY OUTPATIENT CLINIC VISIT  ? ?Primary Care Provider ?Hayden Rasmussen, MD ?[place] ?[place] [address] ?[phone] ? ?Patient Profile: ?Terry [name] is a 76 y.o. male with a pmh significant for Parkinson's disease, CAD, status post prior CVA, carotid artery disease (status post endarterectomy), hypertension, hyperlipidemia, renal artery stenosis, GERD, previous IDA (improved after iron supplementation).  The patient presents to the [facility] for an evaluation and management of problem(s) noted below: ? ?Problem List ?1. Lower abdominal pain   ?2. Irritable bowel syndrome with both constipation and diarrhea   ?3. Change in bowel habits   ?4. History of iron deficiency   ?5. Gastroesophageal reflux disease, unspecified whether esophagitis present   ? ? ? ?History of Present Illness ?Please see prior notes for full details of HPI. ? ?Interval History ?The patient returns for follow-up today.  He has been feeling much better with his PPI therapy as well as Carafate though he has not had Carafate for a few weeks now.  He denies any significant dysphagia symptoms and he is eating as he likes.  Most recent blood counts had shown improvement and he was no longer anemic.  He is interested in an upper and lower endoscopy but is not sure from a health standpoint that he wants to undergo that as of yet.  He denies significant abdominal pain.  He is still experiencing issues of dyspnea and fatigue and will be seeing his primary care provider in the coming weeks. ? ?Interval History ?The patient returns for follow-up today.  He feels that overall things are slightly better than our last visit.  He continues to experience bowel habit changes regards to diarrhea and constipation alternation though he feels he never completely is able to have evacuation of his entire bowels.  He worries that he is still having constipation.  He continues on fiber supplementation.  He  is heartburn symptoms remain controlled without significant dysphagia.  He does think that he is ready to move forward with endoscopic evaluation. ? ?GI Review of Systems ?Positive as above ?Negative for odynophagia, nausea, vomiting, melena, hematochezia ? ?Review of Systems ?General: Denies fevers/chills/weight loss unintentionally ?Cardiovascular: Denies chest pain ?Pulmonary: Denies shortness of breath ?Gastroenterological: See HPI ?Genitourinary: Denies darkened urine or hematuria ?Hematological: Denies easy bruising/bleeding ?Dermatological: Denies jaundice ?Psychological: Mood is stable ? ? ?Medications ?Current Outpatient Medications  ?Medication Sig Dispense Refill  ? acetaminophen (TYLENOL) 500 MG tablet Take 500 mg by mouth every 6 (six) hours as needed for moderate pain or headache.    ? amLODipine (NORVASC) 2.5 MG tablet Take 2.5 mg by mouth 2 (two) times daily.    ? aspirin EC 81 MG EC tablet Take 1 tablet (81 mg total) by mouth daily. Swallow whole. 30 tablet 1  ? atorvastatin (LIPITOR) 20 MG tablet TAKE ONE TABLET BY MOUTH EVERY NIGHT AT BEDTIME (Patient taking differently: Take 20 mg by mouth every evening.) 90 tablet 0  ? carbidopa-levodopa (SINEMET CR) 50-200 MG tablet Take 1 tablet by mouth at bedtime. 90 tablet 1  ? carbidopa-levodopa (SINEMET IR) 25-100 MG tablet Take 2.5 tablets by mouth 4 (four) times daily. 7am, 12pm, 5pm, 10pm    ? cetirizine (ZYRTEC) 10 MG tablet Take 10 mg by mouth as needed for allergies.     ? Cholecalciferol (VITAMIN D3) 1.25 MG (50000 UT) CAPS Take 1 capsule by mouth once a week.    ? Cyanocobalamin (VITAMIN B-12 IJ) Inject 1,000 mcg as directed  every 30 (thirty) days.    ? fluticasone (FLONASE) 50 MCG/ACT nasal spray Place 1 spray into both nostrils daily as needed for allergies.     ? meloxicam (MOBIC) 15 MG tablet Take 15 mg by mouth at bedtime as needed for pain.    ? Na Sulfate-K Sulfate-Mg Sulf (SUPREP BOWEL PREP KIT) 17.5-3.13-1.6 GM/177ML SOLN Take 1 kit by  mouth as directed. For colonoscopy prep 354 mL 0  ? NEUPRO 4 MG/24HR Place 1 patch onto the skin daily.    ? pantoprazole (PROTONIX) 40 MG tablet Take 1 tablet (40 mg total) by mouth daily. 90 tablet 4  ? sucralfate (CARAFATE) 1 g tablet Take 1 tablet (1 g total) by mouth 2 (two) times daily for 7 days. 60 tablet 2  ? tiZANidine (ZANAFLEX) 2 MG tablet Take 2 mg by mouth daily as needed for muscle spasms.    ? traZODone (DESYREL) 50 MG tablet Take 1 tablet (50 mg total) by mouth at bedtime as needed for sleep. 90 tablet 0  ? valsartan (DIOVAN) 40 MG tablet Take 20 mg by mouth as needed.    ? nitroGLYCERIN (NITROSTAT) 0.4 MG SL tablet Place 1 tablet (0.4 mg total) under the tongue every 5 (five) minutes as needed for chest pain. (Patient not taking: Reported on 11/09/2021) 90 tablet 3  ? ?No current facility-administered medications for this visit.  ? ? ?Allergies ?Allergies  ?Allergen Reactions  ? Penicillins Rash  ? Erythromycin Rash  ? Clam Shell Nausea And Vomiting  ? ? ?Histories ?Past Medical History:  ?Diagnosis Date  ? Carotid artery stenosis 04/29/2018  ? Chronic tension headaches 04/29/2018  ? Essential hypertension 04/29/2018  ? GERD (gastroesophageal reflux disease) 04/29/2018  ? HLD (hyperlipidemia) 04/29/2018  ? Parkinson's disease ([place]) 04/29/2018  ? ?Past Surgical History:  ?Procedure Laterality Date  ? ENDARTERECTOMY Left 04/27/2021  ? Procedure: LEFT CAROTID ENDARTERECTOMY;  Surgeon: Serafina Mitchell, MD;  Location: [facility] OR;  Service: Vascular;  Laterality: Left;  ? LEFT HEART CATH AND CORONARY ANGIOGRAPHY N/A 11/25/2019  ? Procedure: LEFT HEART CATH AND CORONARY ANGIOGRAPHY;  Surgeon: Belva Crome, MD;  Location: [facility];  Service: Cardiovascular;  Laterality: N/A;  ? PATCH ANGIOPLASTY Left 04/27/2021  ? Procedure: PATCH ANGIOPLASTY USING Rueben Bash BIOLOGIC PATCH;  Surgeon: Serafina Mitchell, MD;  Location: [facility] OR;  Service: Vascular;  Laterality: Left;  ? TONSILLECTOMY    ? ?Social History   ? ?Socioeconomic History  ? Marital status: Married  ?  Spouse name: Not on file  ? Number of children: Not on file  ? Years of education: Not on file  ? Highest education level: Not on file  ?Occupational History  ? Not on file  ?Tobacco Use  ? Smoking status: Never  ?  Passive exposure: Never  ? Smokeless tobacco: Former  ?  Types: Snuff  ?Vaping Use  ? Vaping Use: Never used  ?Substance and Sexual Activity  ? Alcohol use: Yes  ?  Comment: Occasionally.  ? Drug use: Never  ? Sexual activity: Not Currently  ?Other Topics Concern  ? Not on file  ?Social History Narrative  ? Not on file  ? ?Social Determinants of Health  ? ?Financial Resource Strain: Not on file  ?Food Insecurity: Not on file  ?Transportation Needs: Not on file  ?Physical Activity: Not on file  ?Stress: Not on file  ?Social Connections: Not on file  ?Intimate Partner Violence: Not on file  ? ?Family History  ?  Problem Relation Age of Onset  ? Heart disease Mother   ? Cancer Father   ? Parkinson's disease Sister   ? Colon cancer Neg Hx   ? Stomach cancer Neg Hx   ? Esophageal cancer Neg Hx   ? Inflammatory bowel disease Neg Hx   ? Liver disease Neg Hx   ? Pancreatic cancer Neg Hx   ? Rectal cancer Neg Hx   ? ?I have reviewed his medical, social, and family history in detail and updated the electronic medical record as necessary.  ? ? ?PHYSICAL EXAMINATION  ?BP (!) 130/50 (BP Location: Left Arm, Patient Position: Sitting, Cuff Size: Normal)   Pulse (!) 56   Ht 6' (1.829 m)   Wt 172 lb 8 oz (78.2 kg)   BMI 23.40 kg/m?  ?Wt Readings from Last 3 Encounters:  ?11/09/21 172 lb 8 oz (78.2 kg)  ?11/02/21 174 lb 12.8 oz (79.3 kg)  ?09/07/21 171 lb 12.8 oz (77.9 kg)  ?GEN: NAD, appears stated age, doesn't appear chronically ill ?PSYCH: Cooperative, without pressured speech ?EYE: Conjunctivae pink, sclerae anicteric ?ENT: MMM ?CV: Nontachycardic ?RESP: No audible wheezing ?GI: NABS, soft, NT/ND, without rebound ?MSK/EXT: No lower extremity edema ?SKIN: No  jaundice ?NEURO:  Alert & Oriented x 3, no focal deficits's ? ? ?REVIEW OF DATA  ?I reviewed the following data at the time of this encounter: ? ?GI Procedures and Studies  ?No new imaging studies to review ? ?Laboratory

## 2021-11-10 NOTE — Progress Notes (Signed)
?Electrophysiology Office Note:   ? ?Date:  11/12/2021  ? ?ID:  Suhail Peloquin, DOB 02/03/1946, MRN [id number] ? ?PCP:  Hayden Rasmussen, MD  ?[facility] Cardiologist:  Evalina Field, MD  ?[facility]:  None  ? ?Referring MD: Geralynn Rile, *  ? ?Chief Complaint: Syncope ? ?History of Present Illness:   ? ?Jameir Ake is a 76 y.o. male who presents for an evaluation of syncope at the request of Dr. Audie Box. Their medical history includes syncope, Parkinson's disease, carotid artery disease, CVA, peripheral vascular disease, hyperlipidemia.  The patient was seen by Dr. Audie Box September 02, 2021. ?The patient tells me that he has had 5 or 6 passing out spells.  He tells me that he is actually awake as he falls but somehow loses his stability and strength.  All of the episodes have occurred while walking.  He has never had an episode at rest.  He has never had personal injury from a fall. ? ? ?  ?Past Medical History:  ?Diagnosis Date  ? Carotid artery stenosis 04/29/2018  ? Chronic tension headaches 04/29/2018  ? Essential hypertension 04/29/2018  ? GERD (gastroesophageal reflux disease) 04/29/2018  ? HLD (hyperlipidemia) 04/29/2018  ? Parkinson's disease ([place]) 04/29/2018  ? ? ?Past Surgical History:  ?Procedure Laterality Date  ? ENDARTERECTOMY Left 04/27/2021  ? Procedure: LEFT CAROTID ENDARTERECTOMY;  Surgeon: Serafina Mitchell, MD;  Location: [facility] OR;  Service: Vascular;  Laterality: Left;  ? LEFT HEART CATH AND CORONARY ANGIOGRAPHY N/A 11/25/2019  ? Procedure: LEFT HEART CATH AND CORONARY ANGIOGRAPHY;  Surgeon: Belva Crome, MD;  Location: [facility];  Service: Cardiovascular;  Laterality: N/A;  ? PATCH ANGIOPLASTY Left 04/27/2021  ? Procedure: PATCH ANGIOPLASTY USING Rueben Bash BIOLOGIC PATCH;  Surgeon: Serafina Mitchell, MD;  Location: [facility] OR;  Service: Vascular;  Laterality: Left;  ? TONSILLECTOMY    ? ? ?Current Medications: ?Current Meds  ?Medication Sig  ? acetaminophen (TYLENOL)  500 MG tablet Take 500 mg by mouth every 6 (six) hours as needed for moderate pain or headache.  ? amLODipine (NORVASC) 2.5 MG tablet Take 2.5 mg by mouth 2 (two) times daily.  ? aspirin EC 81 MG EC tablet Take 1 tablet (81 mg total) by mouth daily. Swallow whole.  ? atorvastatin (LIPITOR) 20 MG tablet TAKE ONE TABLET BY MOUTH EVERY NIGHT AT BEDTIME (Patient taking differently: Take 20 mg by mouth every evening.)  ? carbidopa-levodopa (SINEMET CR) 50-200 MG tablet Take 1 tablet by mouth at bedtime.  ? carbidopa-levodopa (SINEMET IR) 25-100 MG tablet Take 2.5 tablets by mouth 4 (four) times daily. 7am, 12pm, 5pm, 10pm  ? cetirizine (ZYRTEC) 10 MG tablet Take 10 mg by mouth as needed for allergies.   ? Cholecalciferol (VITAMIN D3) 1.25 MG (50000 UT) CAPS Take 1 capsule by mouth once a week.  ? Cyanocobalamin (VITAMIN B-12 IJ) Inject 1,000 mcg as directed every 30 (thirty) days.  ? fluticasone (FLONASE) 50 MCG/ACT nasal spray Place 1 spray into both nostrils daily as needed for allergies.   ? meloxicam (MOBIC) 15 MG tablet Take 15 mg by mouth at bedtime as needed for pain.  ? Na Sulfate-K Sulfate-Mg Sulf (SUPREP BOWEL PREP KIT) 17.5-3.13-1.6 GM/177ML SOLN Take 1 kit by mouth as directed. For colonoscopy prep  ? NEUPRO 4 MG/24HR Place 1 patch onto the skin daily.  ? pantoprazole (PROTONIX) 40 MG tablet Take 1 tablet (40 mg total) by mouth daily.  ? tiZANidine (ZANAFLEX) 2 MG  tablet Take 2 mg by mouth daily as needed for muscle spasms.  ? traZODone (DESYREL) 50 MG tablet Take 1 tablet (50 mg total) by mouth at bedtime as needed for sleep.  ? valsartan (DIOVAN) 40 MG tablet Take 20 mg by mouth as needed.  ?  ? ?Allergies:   Penicillins, Erythromycin, and Clam shell  ? ?Social History  ? ?Socioeconomic History  ? Marital status: Married  ?  Spouse name: Not on file  ? Number of children: Not on file  ? Years of education: Not on file  ? Highest education level: Not on file  ?Occupational History  ? Not on file  ?Tobacco  Use  ? Smoking status: Never  ?  Passive exposure: Never  ? Smokeless tobacco: Former  ?  Types: Snuff  ?Vaping Use  ? Vaping Use: Never used  ?Substance and Sexual Activity  ? Alcohol use: Yes  ?  Comment: Occasionally.  ? Drug use: Never  ? Sexual activity: Not Currently  ?Other Topics Concern  ? Not on file  ?Social History Narrative  ? Not on file  ? ?Social Determinants of Health  ? ?Financial Resource Strain: Not on file  ?Food Insecurity: Not on file  ?Transportation Needs: Not on file  ?Physical Activity: Not on file  ?Stress: Not on file  ?Social Connections: Not on file  ?  ? ?Family History: ?The patient's family history includes Cancer in his father; Heart disease in his mother; Parkinson's disease in his sister. There is no history of Colon cancer, Stomach cancer, Esophageal cancer, Inflammatory bowel disease, Liver disease, Pancreatic cancer, or Rectal cancer. ? ?ROS:   ?Please see the history of present illness.    ?All other systems reviewed and are negative. ? ?EKGs/Labs/Other Studies Reviewed:   ? ?The following studies were reviewed today: ? ?August 22, 2021 ZIO monitor ?Impression: ?1. Episode of paroxysmal AV block 08/07/2021 @ 2:32 pm which possibly represent paroxysmal AV block (vagal mediated) due to lengthening of the P-P interval with dropped QRS complexes. Total duration 4.4 seconds.  ?2. Second degree AV block, Mobitz 1 present.  ?3. Brief SVT was present, likely atrial tachycardia (6 episodes, longest 9.5 seconds).  ?  ? ? ? ? ?EKG:  The ekg ordered today demonstrates sinus rhythm.  Normal axis.  QRS is narrow.  QTc is normal.  PR is prolonged at 240 ms. ? ? ?Recent Labs: ?04/24/2021: Magnesium 2.0 ?04/25/2021: TSH 1.272 ?08/15/2021: ALT <5; BUN 15; Creatinine, Ser 0.88; Hemoglobin 13.4; Platelets 197; Potassium 4.2; Sodium 136  ?Recent Lipid Panel ?   ?Component Value Date/Time  ? CHOL 78 04/28/2021 0445  ? CHOL 99 (L) 12/10/2019 1112  ? TRIG 52 04/28/2021 0445  ? HDL 37 (L) 04/28/2021  0445  ? HDL 47 12/10/2019 1112  ? CHOLHDL 2.1 04/28/2021 0445  ? VLDL 10 04/28/2021 0445  ? [place] 31 04/28/2021 0445  ? LDLCALC 39 12/10/2019 1112  ? ? ?Physical Exam:   ? ?VS:  BP 124/80   Pulse 64   Ht 6' (1.829 m)   Wt 171 lb 12.8 oz (77.9 kg)   SpO2 98%   BMI 23.30 kg/m?    ? ?Wt Readings from Last 3 Encounters:  ?11/11/21 171 lb 12.8 oz (77.9 kg)  ?11/09/21 172 lb 8 oz (78.2 kg)  ?11/02/21 174 lb 12.8 oz (79.3 kg)  ?  ? ?GEN:  Well nourished, well developed in no acute distress ?HEENT: Normal ?NECK: No JVD; No carotid bruits ?LYMPHATICS:  No lymphadenopathy ?CARDIAC: RRR, no murmurs, rubs, gallops ?RESPIRATORY:  Clear to auscultation without rales, wheezing or rhonchi  ?ABDOMEN: Soft, non-tender, non-distended ?MUSCULOSKELETAL:  No edema; No deformity  ?SKIN: Warm and dry ?NEUROLOGIC:  Alert and oriented x 3.  Tremor noted in bilateral upper extremities. ?PSYCHIATRIC:  Normal affect  ? ? ?  ? ?ASSESSMENT:   ? ?1. Syncope and collapse   ?2. Heart block AV complete ([place])   ? ?PLAN:   ? ?In order of problems listed above: ? ?#Syncope history ?#Paroxysmal AV block ?Does appear that his AV block on the monitor was vagally mediated.  There is P-P prolongation in addition to P-RR prolongation leading up to the dropped QRS complexes.  He was asymptomatic at the time of the monitor episode.  All episodes have been with standing which raises the suspicion of an orthostatic component as well.  There is no evidence of baseline conduction system disease on his EKG.  We discussed the treatment options available including pacemaker implant and continued monitoring.  I have recommended that we start with a loop recorder for more consistent monitoring of his heart rhythm and to see if there is any correlation between his symptoms and heart rhythm abnormalities.  If the patient does not fact demonstrate a link between paroxysmal AV block and syncopal episodes, would favor proceeding with pacemaker implant.  This was  discussed with the patient today.  We will get his loop recorder implant set up.   ? ?Planning on [employer] loop. ? ? ? ?Medication Adjustments/Labs and Tests Ordered: ?Current medicines are reviewed at lengt

## 2021-11-11 ENCOUNTER — Ambulatory Visit: Payer: Medicare Other | Admitting: Cardiology

## 2021-11-11 ENCOUNTER — Encounter: Payer: Self-pay | Admitting: Gastroenterology

## 2021-11-11 ENCOUNTER — Encounter: Payer: Self-pay | Admitting: Cardiology

## 2021-11-11 VITALS — BP 124/80 | HR 64 | Ht 72.0 in | Wt 171.8 lb

## 2021-11-11 DIAGNOSIS — R55 Syncope and collapse: Secondary | ICD-10-CM | POA: Diagnosis not present

## 2021-11-11 DIAGNOSIS — I442 Atrioventricular block, complete: Secondary | ICD-10-CM | POA: Diagnosis not present

## 2021-11-11 DIAGNOSIS — R1030 Lower abdominal pain, unspecified: Secondary | ICD-10-CM | POA: Insufficient documentation

## 2021-11-11 DIAGNOSIS — K582 Mixed irritable bowel syndrome: Secondary | ICD-10-CM | POA: Insufficient documentation

## 2021-11-11 DIAGNOSIS — Z8639 Personal history of other endocrine, nutritional and metabolic disease: Secondary | ICD-10-CM | POA: Insufficient documentation

## 2021-11-11 DIAGNOSIS — R194 Change in bowel habit: Secondary | ICD-10-CM | POA: Insufficient documentation

## 2021-11-11 NOTE — Patient Instructions (Signed)
Medication Instructions:  ?Your physician recommends that you continue on your current medications as directed. Please refer to the Current Medication list given to you today. ?*If you need a refill on your cardiac medications before your next appointment, please call your pharmacy* ? ?Lab Work: ?None. ?If you have labs (blood work) drawn today and your tests are completely normal, you will receive your results only by: ?MyChart Message (if you have MyChart) OR ?A paper copy in the mail ?If you have any lab test that is abnormal or we need to change your treatment, we will call you to review the results. ? ?Testing/Procedures: ?None. ? ?Follow-Up: ?At [facility], you and your health needs are our priority.  As part of our continuing mission to provide you with exceptional heart care, we have created designated Provider Care Teams.  These Care Teams include your primary Cardiologist (physician) and Advanced Practice Providers (APPs -  Physician Assistants and Nurse Practitioners) who all work together to provide you with the care you need, when you need it. ? ?Your physician wants you to follow-up in: next available for loop implant in office with Dr. Lalla Brothers. ? ?We recommend signing up for the patient portal called "MyChart".  Sign up information is provided on this After Visit Summary.  MyChart is used to connect with patients for Virtual Visits (Telemedicine).  Patients are able to view lab/test results, encounter notes, upcoming appointments, etc.  Non-urgent messages can be sent to your provider as well.   ?To learn more about what you can do with MyChart, go to [email or web address].   ? ?Any Other Special Instructions Will Be Listed Below (If Applicable). ? ?Implantable Loop Recorder Placement ? ?An implantable loop recorder is a small electronic device that is placed under the skin of your chest. The device records the electrical activity of your heart over a long period of time. Your health care  provider can download these recordings to monitor your heart. ?You may need an implantable loop recorder if you have periods of abnormal heart activity (arrhythmias) or unexplained fainting (syncope). The recorder can be left in place for 1 year or longer. ?Tell a health care provider about: ?Any allergies you have. ?All medicines you are taking, including vitamins, herbs, eye drops, creams, and over-the-counter medicines. ?Any problems you or family members have had with anesthetic medicines. ?Any bleeding problems you have. ?Any surgeries you have had. ?Any medical conditions you have. ?Whether you are pregnant or may be pregnant. ?What are the risks? ?Generally, this is a safe procedure. However, problems may occur, including: ?Infection. ?Bleeding. ?Allergic reactions to anesthetic medicines. ?Damage to nerves or blood vessels. ?Failure of the device to work. This could require another surgery to replace it. ?What happens before the procedure? ? ?You may have a physical exam, blood tests, and imaging tests of your heart, such as a chest X-ray. ?Follow instructions from your health care provider about eating or drinking restrictions. ?Ask your health care provider about: ?Changing or stopping your regular medicines. This is especially important if you are taking diabetes medicines or blood thinners. ?Taking medicines such as aspirin and ibuprofen. These medicines can thin your blood. Do not take these medicines unless your health care provider tells you to take them. ?Taking over-the-counter medicines, vitamins, herbs, and supplements. ?Ask your health care provider how your surgical site will be marked or identified. ?Ask your health care provider what steps will be taken to help prevent infection. These may include: ?Removing hair at the surgery  site. ?Washing skin with a germ-killing soap. ?Plan to have someone take you home from the hospital or clinic. ?Plan to have a responsible adult care for you for at  least 24 hours after you leave the hospital or clinic. This is important. ?Do not use any products that contain nicotine or tobacco, such as cigarettes and e-cigarettes. If you need help quitting, ask your health care provider. ?What happens during the procedure? ?An IV will be inserted into one of your veins. ?You may be given one or more of the following: ?A medicine to help you relax (sedative). ?A medicine to numb the area (local anesthetic). ?A small incision will be made on the left side of your upper chest. ?A pocket will be created under your skin. ?The device will be placed in the pocket. ?The incision will be closed with stitches (sutures) or adhesive strips. ?A bandage (dressing) will be placed over the incision. ?The procedure may vary among health care providers and hospitals. ?What happens after the procedure? ?Your blood pressure, heart rate, breathing rate, and blood oxygen level will be monitored until you leave the hospital or clinic. ?You may be able to go home on the day of your surgery. Before you go home: ?Your health care provider will program your recorder. ?You will learn how to trigger your device with a handheld activator. ?You will learn how to send recordings to your health care provider. ?You will get an ID card for your device, and you will be told when to use it. ?Do not drive for 24 hours if you were given a sedative during your procedure. ?Summary ?An implantable loop recorder is a small electronic device that is placed under the skin of your chest to monitor your heart over a long period of time. ?The recorder can be left in place for 1 year or longer. ?Plan to have someone take you home from the hospital or clinic. ?This information is not intended to replace advice given to you by your health care provider. Make sure you discuss any questions you have with your health care provider. ?Document Revised: 11/09/2020 Document Reviewed: 11/09/2020 ?Elsevier Patient Education ? 2023  [employer]vier Inc. ? ? ? ? ?  ? ? ?

## 2021-11-14 ENCOUNTER — Telehealth: Payer: Self-pay | Admitting: Cardiology

## 2021-11-14 NOTE — Telephone Encounter (Signed)
Patient called to speak to Inetta Fermo to schedule PPM implant  ?

## 2021-11-16 ENCOUNTER — Encounter: Payer: Self-pay | Admitting: Gastroenterology

## 2021-11-30 ENCOUNTER — Encounter: Payer: Self-pay | Admitting: Gastroenterology

## 2021-12-05 ENCOUNTER — Other Ambulatory Visit: Payer: Self-pay

## 2021-12-05 MED ORDER — LINACLOTIDE 72 MCG PO CAPS: 72.0000 ug | ORAL_CAPSULE | Freq: Every day | ORAL | 2 refills | Status: DC

## 2021-12-05 NOTE — Progress Notes (Signed)
Prescription for Linzess 72 mcg has been sent to [facility] [address]. Pt has been informed via my chart message.  ?

## 2021-12-13 ENCOUNTER — Ambulatory Visit: Payer: Medicare Other | Admitting: Neurology

## 2021-12-13 ENCOUNTER — Encounter: Payer: Self-pay | Admitting: Cardiology

## 2021-12-13 ENCOUNTER — Ambulatory Visit: Payer: Medicare Other | Admitting: Cardiology

## 2021-12-13 VITALS — BP 144/70 | HR 62 | Ht 72.0 in | Wt 174.0 lb

## 2021-12-13 DIAGNOSIS — G903 Multi-system degeneration of the autonomic nervous system: Secondary | ICD-10-CM

## 2021-12-13 DIAGNOSIS — I442 Atrioventricular block, complete: Secondary | ICD-10-CM | POA: Diagnosis not present

## 2021-12-13 DIAGNOSIS — R55 Syncope and collapse: Secondary | ICD-10-CM

## 2021-12-13 NOTE — Patient Instructions (Signed)
Medication Instructions:  Your physician recommends that you continue on your current medications as directed. Please refer to the Current Medication list given to you today.  Labwork: None ordered.  Testing/Procedures: None ordered.  Follow-Up:  Your physician wants you to follow-up in: one year with EP APP Renee or Jonni Sanger.  You will receive a reminder letter in the mail two months in advance. If you don't receive a letter, please call our office to schedule the follow-up appointment.    Implantable Loop Recorder Placement, Care After This sheet gives you information about how to care for yourself after your procedure. Your health care provider may also give you more specific instructions. If you have problems or questions, contact your health care provider. What can I expect after the procedure? After the procedure, it is common to have: Soreness or discomfort near the incision. Some swelling or bruising near the incision.  Follow these instructions at home: Incision care  Monitor your cardiac device site for redness, swelling, and drainage. Call the device clinic at [phone] if you experience these symptoms or fever/chills.  Keep the large square bandage on your site for 24 hours and then you may remove it yourself. Keep the steri-strips underneath in place.   You may shower after 72 hours / 3 days from your procedure with the steri-strips in place. They will usually fall off on their own, or may be removed after 10 days. Pat dry.   Avoid lotions, ointments, or perfumes over your incision until it is well-healed.  Please do not submerge in water until your site is completely healed.   Your device is MRI compatible.   Remote monitoring is used to monitor your cardiac device from home. This monitoring is scheduled every month by our office. It allows [place] to keep an eye on the function of your device to ensure it is working properly.  If your wound site starts to bleed apply  pressure.      If you have any questions/concerns please call the device clinic at [phone].  Activity  Return to your normal activities.  General instructions Follow instructions from your health care provider about how to manage your implantable loop recorder and transmit the information. Learn how to activate a recording if this is necessary for your type of device. You may go through a metal detection gate, and you may let someone hold a metal detector over your chest. Show your ID card if needed. Do not have an MRI unless you check with your health care provider first. Take over-the-counter and prescription medicines only as told by your health care provider. Keep all follow-up visits as told by your health care provider. This is important. Contact a health care provider if: You have redness, swelling, or pain around your incision. You have a fever. You have pain that is not relieved by your pain medicine. You have triggered your device because of fainting (syncope) or because of a heartbeat that feels like it is racing, slow, fluttering, or skipping (palpitations). Get help right away if you have: Chest pain. Difficulty breathing. Summary After the procedure, it is common to have soreness or discomfort near the incision. Change your dressing as told by your health care provider. Follow instructions from your health care provider about how to manage your implantable loop recorder and transmit the information. Keep all follow-up visits as told by your health care provider. This is important. This information is not intended to replace advice given to you by your health  care provider. Make sure you discuss any questions you have with your health care provider. Document Released: 06/21/2015 Document Revised: 08/25/2017 Document Reviewed: 08/25/2017 Elsevier Patient Education  2020 [employer].

## 2021-12-13 NOTE — Progress Notes (Signed)
Electrophysiology Office Follow up Visit Note:    Date:  12/13/2021   ID:  Terry [name], DOB Feb 28, 1975, MRN [id number]  PCP:  Hayden Rasmussen, MD  [facility] Cardiologist:  Evalina Field, MD  [facility] Electrophysiologist:  Vickie Epley, MD    Interval History:    Terry [name] is a 76 y.o. male who presents for a follow up visit. They were last seen in clinic 11/11/2021.  He is scheduled for a BSX loop implant today to monitor for correlations between paroxysmal AV block and his syncopal episodes.  Overall, he appears well.  He denies any recurrent syncopal episodes.  He denies any palpitations, chest pain, shortness of breath, or peripheral edema. No lightheadedness, headaches, syncope, orthopnea, or PND.      Past Medical History:  Diagnosis Date   Carotid artery stenosis 04/29/2018   Chronic tension headaches 04/29/2018   Essential hypertension 04/29/2018   GERD (gastroesophageal reflux disease) 04/29/2018   HLD (hyperlipidemia) 04/29/2018   Parkinson's disease ([place]) 04/29/2018    Past Surgical History:  Procedure Laterality Date   ENDARTERECTOMY Left 04/27/2021   Procedure: LEFT CAROTID ENDARTERECTOMY;  Surgeon: Serafina Mitchell, MD;  [facility]: [facility] OR;  Service: Vascular;  Laterality: Left;   LEFT HEART CATH AND CORONARY ANGIOGRAPHY N/A 11/25/2019   Procedure: LEFT HEART CATH AND CORONARY ANGIOGRAPHY;  Surgeon: Belva Crome, MD;  Location: [facility];  Service: Cardiovascular;  Laterality: N/A;   PATCH ANGIOPLASTY Left 04/27/2021   Procedure: PATCH ANGIOPLASTY USING Rueben Bash BIOLOGIC PATCH;  Surgeon: Serafina Mitchell, MD;  Location: [facility] OR;  Service: Vascular;  Laterality: Left;   TONSILLECTOMY      Current Medications: Current Meds  Medication Sig   acetaminophen (TYLENOL) 500 MG tablet Take 500 mg by mouth every 6 (six) hours as needed for moderate pain or headache.   amLODipine (NORVASC) 2.5 MG tablet Take 2.5 mg by mouth 2 (two) times daily.    aspirin EC 81 MG EC tablet Take 1 tablet (81 mg total) by mouth daily. Swallow whole.   atorvastatin (LIPITOR) 20 MG tablet TAKE ONE TABLET BY MOUTH EVERY NIGHT AT BEDTIME (Patient taking differently: Take 20 mg by mouth every evening.)   carbidopa-levodopa (SINEMET CR) 50-200 MG tablet Take 1 tablet by mouth at bedtime.   carbidopa-levodopa (SINEMET IR) 25-100 MG tablet Take 2.5 tablets by mouth 4 (four) times daily. 7am, 12pm, 5pm, 10pm   cetirizine (ZYRTEC) 10 MG tablet Take 10 mg by mouth as needed for allergies.    Cholecalciferol (VITAMIN D3) 1.25 MG (50000 UT) CAPS Take 1 capsule by mouth once a week.   Cyanocobalamin (VITAMIN B-12 IJ) Inject 1,000 mcg as directed every 30 (thirty) days.   fluticasone (FLONASE) 50 MCG/ACT nasal spray Place 1 spray into both nostrils daily as needed for allergies.    linaclotide (LINZESS) 72 MCG capsule Take 1 capsule (72 mcg total) by mouth daily before breakfast.   meloxicam (MOBIC) 15 MG tablet Take 15 mg by mouth at bedtime as needed for pain.   Na Sulfate-K Sulfate-Mg Sulf (SUPREP BOWEL PREP KIT) 17.5-3.13-1.6 GM/177ML SOLN Take 1 kit by mouth as directed. For colonoscopy prep   NEUPRO 4 MG/24HR Place 1 patch onto the skin daily.   pantoprazole (PROTONIX) 40 MG tablet Take 1 tablet (40 mg total) by mouth daily.   tiZANidine (ZANAFLEX) 2 MG tablet Take 2 mg by mouth daily as needed for muscle spasms.   traZODone (DESYREL) 50 MG tablet Take  1 tablet (50 mg total) by mouth at bedtime as needed for sleep.   valsartan (DIOVAN) 40 MG tablet Take 20 mg by mouth as needed.     Allergies:   Penicillins, Erythromycin, and Clam shell   Social History   Socioeconomic History   Marital status: Married    Spouse name: Not on file   Number of children: Not on file   Years of education: Not on file   Highest education level: Not on file  Occupational History   Not on file  Tobacco Use   Smoking status: Never    Passive exposure: Never   Smokeless  tobacco: Former    Types: Snuff  Vaping Use   Vaping Use: Never used  Substance and Sexual Activity   Alcohol use: Yes    Comment: Occasionally.   Drug use: Never   Sexual activity: Not Currently  Other Topics Concern   Not on file  Social History Narrative   Not on file   Social Determinants of Health   Financial Resource Strain: Not on file  Food Insecurity: Not on file  Transportation Needs: Not on file  Physical Activity: Not on file  Stress: Not on file  Social Connections: Not on file     Family History: The patient's family history includes Cancer in his father; Heart disease in his mother; Parkinson's disease in his sister. There is no history of Colon cancer, Stomach cancer, Esophageal cancer, Inflammatory bowel disease, Liver disease, Pancreatic cancer, or Rectal cancer.  ROS:   Please see the history of present illness.    All other systems reviewed and are negative.  EKGs/Labs/Other Studies Reviewed:    The following studies were reviewed today:  08/29/2021  Bilateral Renal Artery Doppler Summary:  Renal:     Right: Normal size right kidney. 1-59% stenosis of the right renal         artery. Abnormal right Resistive Index.  Left:  Normal size of left kidney. 1-59% stenosis of the left renal         artery. Abnormal left Resisitve Index.  Mesenteric:  70 to 99% stenosis in the celiac artery and superior mesenteric artery.   August 22, 2021 ZIO monitor Impression: 1. Episode of paroxysmal AV block 08/07/2021 @ 2:32 pm which possibly represent paroxysmal AV block (vagal mediated) due to lengthening of the P-P interval with dropped QRS complexes. Total duration 4.4 seconds.  2. Second degree AV block, Mobitz 1 present.  3. Brief SVT was present, likely atrial tachycardia (6 episodes, longest 9.5 seconds).       04/24/2021  Echo  1. Left ventricular ejection fraction, by estimation, is 65 to 70%. The  left ventricle has normal function. The left ventricle  has no regional  wall motion abnormalities. There is moderate left ventricular hypertrophy.  Left ventricular diastolic  parameters are indeterminate.   2. Right ventricular systolic function is normal. The right ventricular  size is normal.   3. Left atrial size was mildly dilated.   4. Right atrial size was mildly dilated.   5. The mitral valve is grossly normal. No evidence of mitral valve  regurgitation.   6. The aortic valve is tricuspid. Aortic valve regurgitation is not  visualized.   Comparison(s): Prior images reviewed side by side. No significant change  in LVEF.    EKG:  EKG is personally reviewed.  12/13/2021: EKG was not ordered. 11/11/2021: sinus rhythm. Normal axis. QRS is narrow. QTc is normal. PR is prolonged at  240 ms.   Recent Labs: 04/24/2021: Magnesium 2.0 04/25/2021: TSH 1.272 08/15/2021: ALT <5; BUN 15; Creatinine, Ser 0.88; Hemoglobin 13.4; Platelets 197; Potassium 4.2; Sodium 136   Recent Lipid Panel    Component Value Date/Time   CHOL 78 04/28/2021 0445   CHOL 99 (L) 12/10/2019 1112   TRIG 52 04/28/2021 0445   HDL 37 (L) 04/28/2021 0445   HDL 47 12/10/2019 1112   CHOLHDL 2.1 04/28/2021 0445   VLDL 10 04/28/2021 0445   LDLCALC 31 04/28/2021 0445   LDLCALC 39 12/10/2019 1112    Physical Exam:    VS:  BP (!) 144/70   Pulse 62   Ht 6' (1.829 m)   Wt 174 lb (78.9 kg)   SpO2 94%   BMI 23.60 kg/m     Wt Readings from Last 3 Encounters:  12/13/21 174 lb (78.9 kg)  11/11/21 171 lb 12.8 oz (77.9 kg)  11/09/21 172 lb 8 oz (78.2 kg)     GEN: Well nourished, well developed in no acute distress HEENT: Normal NECK: No JVD; No carotid bruits LYMPHATICS: No lymphadenopathy CARDIAC: RRR, no murmurs, rubs, gallops RESPIRATORY:  Clear to auscultation without rales, wheezing or rhonchi  ABDOMEN: Soft, non-tender, non-distended MUSCULOSKELETAL:  No edema; No deformity  SKIN: Warm and dry NEUROLOGIC:  Alert and oriented x 3 PSYCHIATRIC:  Normal affect         ASSESSMENT:    1. Syncope and collapse   2. Heart block AV complete ([place])   3. 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  He also has experienced syncopal episodes which did not sound arrhythmic in origin.  I recommended loop recorder monitoring.  I discussed the loop recorder procedure in detail with the patient including the risks and he wishes to proceed.  I discussed the cost associated with monthly monitoring.   Medication Adjustments/Labs and Tests Ordered: Current medicines are reviewed at length with the patient today.  Concerns regarding medicines are outlined above.  No orders of the defined types were placed in this encounter.  No orders of the defined types were placed in this encounter.   I,Mathew Stumpf,acting as a Education administrator for Vickie Epley, MD.,have documented all relevant documentation on the behalf of Vickie Epley, MD,as directed by  Vickie Epley, MD while in the presence of Vickie Epley, MD.  I, Vickie Epley, MD, have reviewed all documentation for this visit. The documentation on 12/13/21 for the exam, diagnosis, procedures, and orders are all accurate and complete.   Signed, Lars Mage, MD, [facility], [facility] 12/13/2021 8:34 PM    Electrophysiology [facility]   ----------------------   SURGEON:  Lars Mage, MD    PREPROCEDURE DIAGNOSIS:  Syncope    POSTPROCEDURE DIAGNOSIS:  Syncope     PROCEDURES:   1. Implantable loop recorder implantation    INTRODUCTION: Mr Hartis is a 76 y.o. patient with syncope who presents today for implantable loop implantation.      DESCRIPTION OF PROCEDURE:  Informed written consent was obtained.  The patient required no sedation for the procedure today.  The  cost of monthly monitoring were discussed with the patient and he wished to proceed. The patients left chest was therefore prepped and draped in the usual sterile fashion. The skin overlying the  left parasternal region was infiltrated with lidocaine for local analgesia.  A 0.5-cm incision was made over the left parasternal region over the 3rd intercostal space.  A [employer] Lux-DX  [id number]) implantable loop recorder was then placed into the pocket  R waves were very prominent and measured >0.61m.  Steri- Strips and a sterile dressing were then applied.  There were no early apparent complications.     CONCLUSIONS:   1. Successful implantation of a BChemical engineerLux-DX implantable loop recorder for syncope.  2. No early apparent complications.   CLysbeth GalasT. LQuentin Ore MD, FK[facility]En[facility] ManatiCardiac Electrophysiology

## 2021-12-15 ENCOUNTER — Telehealth: Payer: Self-pay

## 2021-12-15 NOTE — Telephone Encounter (Signed)
Spoke with patient regarding 3.6 second pause episode on 12/14/21 at 23:38 pm patient stated he was asleep at this time.

## 2021-12-20 ENCOUNTER — Telehealth: Payer: Self-pay | Admitting: Cardiology

## 2021-12-20 ENCOUNTER — Telehealth: Payer: Self-pay

## 2021-12-20 NOTE — Telephone Encounter (Signed)
Wife is calling in because patient had a fall on Sunday from a black out. Patient is looking to get him check out

## 2021-12-20 NOTE — Telephone Encounter (Signed)
Patient calling device clinic stating he had a message to call and schedule appointment with Dr. Lalla Brothers. Possible old message as patient was just implanted with [facility] Scientific loop for syncope. Patient also states he had syncopal episode Sunday morning. Loop transmission reviewed with no alerts during syncopal episode. Incidental finding 5.6 second pause 12/16/21 at 10:17 am. Patient unaware of episode. Reviewed with Dr. Lalla Brothers who suggest watchful waiting. Patient advised.

## 2021-12-20 NOTE — Telephone Encounter (Signed)
The patient was calling back about an appointment but [place] did not call the patient for an appointment. I let him speak with [facility].

## 2021-12-28 ENCOUNTER — Encounter (HOSPITAL_COMMUNITY): Payer: Self-pay | Admitting: Gastroenterology

## 2021-12-28 NOTE — Progress Notes (Signed)
Attempted to obtain medical history via telephone, unable to reach at this time. HIPAA compliant voicemail message left requesting return call to pre surgical testing department. 

## 2022-01-03 ENCOUNTER — Telehealth: Payer: Self-pay

## 2022-01-03 NOTE — Telephone Encounter (Signed)
I spoke with the patient. I helped him send a manual transmission. I tried to give him verbal instructions on how to use the symptom button but the patient could not follow directions. Terry [name] is going to help the patient.

## 2022-01-03 NOTE — Telephone Encounter (Signed)
Alert: [employer]:  LINQ alert received.  2 new pause events 6/10 @ 08:53 and 19:43, 3.3sec - 3.7sec in duration  Spoke with patient, patient states that he might have had an episode that day with some dizziness but stated it wasn't bad but couldn't recall the time.  Patient is having trouble logging symptoms with app, reached out to [facility] with [employer] to contact patient and help patient with app. Patients information given to [facility] and patient aware Joey will be calling him.

## 2022-01-08 ENCOUNTER — Telehealth: Payer: Self-pay | Admitting: Physician Assistant

## 2022-01-08 NOTE — Anesthesia Preprocedure Evaluation (Signed)
Anesthesia Evaluation  Patient identified by MRN, date of birth, ID band Patient awake    Airway Mallampati: II  TM Distance: >3 FB Neck ROM: Full    Dental no notable dental hx.    Pulmonary neg pulmonary ROS,    Pulmonary exam normal        Cardiovascular hypertension, Pt. on medications  Rhythm:Regular Rate:Normal     Neuro/Psych  Headaches, Parkinson'sCVA    GI/Hepatic Neg liver ROS, GERD  Medicated,IBS   Endo/Other  negative endocrine ROS  Renal/GU negative Renal ROS  negative genitourinary   Musculoskeletal  (+) Arthritis ,   Abdominal Normal abdominal exam  (+)   Peds  Hematology  (+) Blood dyscrasia, anemia ,   Anesthesia Other Findings   Reproductive/Obstetrics                            Anesthesia Physical Anesthesia Plan  ASA: 3  Anesthesia Plan: MAC   Post-op Pain Management:    Induction: Intravenous  PONV Risk Score and Plan: 1 and Propofol infusion and Treatment may vary due to age or medical condition  Airway Management Planned: Simple Face Mask, Natural Airway and Nasal Cannula  Additional Equipment: None  Intra-op Plan:   Post-operative Plan:   Informed Consent: I have reviewed the patients History and Physical, chart, labs and discussed the procedure including the risks, benefits and alternatives for the proposed anesthesia with the patient or authorized representative who has indicated his/her understanding and acceptance.     Dental advisory given  Plan Discussed with: CRNA  Anesthesia Plan Comments: (Lab Results      Component                Value               Date                      WBC                      11.3 (H)            08/15/2021                HGB                      13.4                08/15/2021                HCT                      40.6                08/15/2021                MCV                      86.8                 08/15/2021                PLT                      197                 08/15/2021  Lab Results      Component                Value               Date                      NA                       136                 08/15/2021                K                        4.2                 08/15/2021                CO2                      26                  08/15/2021                GLUCOSE                  112 (H)             08/15/2021                BUN                      15                  08/15/2021                CREATININE               0.88                08/15/2021                CALCIUM                  9.1                 08/15/2021                GFRNONAA                 >60                 08/15/2021          )       Anesthesia Quick Evaluation

## 2022-01-08 NOTE — Telephone Encounter (Unsigned)
Patient called with questions about prep.  He states that his procedure was moved from 9:15 AM to 7:30 AM tomorrow 01/09/2022 with Dr. Meridee Score. He asked whether he should start the prep 2 hours earlier.  Patient was advised to take each dose of prep 2 hours earlier than initially instructed.

## 2022-01-09 ENCOUNTER — Other Ambulatory Visit: Payer: Self-pay

## 2022-01-09 ENCOUNTER — Encounter (HOSPITAL_COMMUNITY)
Admission: RE | Disposition: A | Discharge status: Home / Self Care | Payer: Self-pay | Source: Home / Self Care | Attending: Gastroenterology

## 2022-01-09 ENCOUNTER — Ambulatory Visit (HOSPITAL_COMMUNITY): Payer: Medicare Other | Admitting: Anesthesiology

## 2022-01-09 ENCOUNTER — Ambulatory Visit (HOSPITAL_BASED_OUTPATIENT_CLINIC_OR_DEPARTMENT_OTHER): Payer: Medicare Other | Admitting: Anesthesiology

## 2022-01-09 ENCOUNTER — Ambulatory Visit (HOSPITAL_COMMUNITY)
Admission: RE | Admit: 2022-01-09 | Discharge: 2022-01-09 | Disposition: A | Discharge status: Home / Self Care | Payer: Medicare Other | Attending: Gastroenterology | Admitting: Gastroenterology

## 2022-01-09 ENCOUNTER — Encounter (HOSPITAL_COMMUNITY): Payer: Self-pay | Admitting: Gastroenterology

## 2022-01-09 DIAGNOSIS — K5730 Diverticulosis of large intestine without perforation or abscess without bleeding: Secondary | ICD-10-CM | POA: Diagnosis not present

## 2022-01-09 DIAGNOSIS — K449 Diaphragmatic hernia without obstruction or gangrene: Secondary | ICD-10-CM

## 2022-01-09 DIAGNOSIS — R197 Diarrhea, unspecified: Secondary | ICD-10-CM | POA: Diagnosis not present

## 2022-01-09 DIAGNOSIS — K580 Irritable bowel syndrome with diarrhea: Secondary | ICD-10-CM | POA: Insufficient documentation

## 2022-01-09 DIAGNOSIS — K2950 Unspecified chronic gastritis without bleeding: Secondary | ICD-10-CM | POA: Diagnosis not present

## 2022-01-09 DIAGNOSIS — K2289 Other specified disease of esophagus: Secondary | ICD-10-CM | POA: Diagnosis not present

## 2022-01-09 DIAGNOSIS — K3189 Other diseases of stomach and duodenum: Secondary | ICD-10-CM

## 2022-01-09 DIAGNOSIS — K644 Residual hemorrhoidal skin tags: Secondary | ICD-10-CM | POA: Insufficient documentation

## 2022-01-09 DIAGNOSIS — D509 Iron deficiency anemia, unspecified: Secondary | ICD-10-CM | POA: Diagnosis not present

## 2022-01-09 DIAGNOSIS — K219 Gastro-esophageal reflux disease without esophagitis: Secondary | ICD-10-CM | POA: Diagnosis present

## 2022-01-09 DIAGNOSIS — R1030 Lower abdominal pain, unspecified: Secondary | ICD-10-CM | POA: Diagnosis not present

## 2022-01-09 DIAGNOSIS — R194 Change in bowel habit: Secondary | ICD-10-CM

## 2022-01-09 DIAGNOSIS — K581 Irritable bowel syndrome with constipation: Secondary | ICD-10-CM | POA: Diagnosis not present

## 2022-01-09 DIAGNOSIS — K641 Second degree hemorrhoids: Secondary | ICD-10-CM | POA: Insufficient documentation

## 2022-01-09 DIAGNOSIS — K582 Mixed irritable bowel syndrome: Secondary | ICD-10-CM

## 2022-01-09 DIAGNOSIS — Q438 Other specified congenital malformations of intestine: Secondary | ICD-10-CM | POA: Diagnosis not present

## 2022-01-09 DIAGNOSIS — Z1211 Encounter for screening for malignant neoplasm of colon: Secondary | ICD-10-CM

## 2022-01-09 HISTORY — PX: BIOPSY: SHX5522

## 2022-01-09 HISTORY — PX: COLONOSCOPY WITH PROPOFOL: SHX5780

## 2022-01-09 HISTORY — PX: ESOPHAGOGASTRODUODENOSCOPY (EGD) WITH PROPOFOL: SHX5813

## 2022-01-09 SURGERY — COLONOSCOPY WITH PROPOFOL
Anesthesia: Monitor Anesthesia Care

## 2022-01-09 MED ORDER — PROPOFOL 1000 MG/100ML IV EMUL
INTRAVENOUS | Status: AC
  Filled 2022-01-09: qty 100

## 2022-01-09 MED ORDER — DEXMEDETOMIDINE (PRECEDEX) IN NS 20 MCG/5ML (4 MCG/ML) IV SYRINGE
PREFILLED_SYRINGE | INTRAVENOUS | Status: DC | PRN
  Administered 2022-01-09: 8 ug via INTRAVENOUS

## 2022-01-09 MED ORDER — SODIUM CHLORIDE 0.9 % IV SOLN: INTRAVENOUS | Status: DC

## 2022-01-09 MED ORDER — DEXMEDETOMIDINE (PRECEDEX) IN NS 20 MCG/5ML (4 MCG/ML) IV SYRINGE
PREFILLED_SYRINGE | INTRAVENOUS | Status: AC
  Filled 2022-01-09: qty 5

## 2022-01-09 MED ORDER — LACTATED RINGERS IV SOLN: INTRAVENOUS | Status: DC

## 2022-01-09 MED ORDER — PROPOFOL 10 MG/ML IV BOLUS
INTRAVENOUS | Status: DC | PRN
  Administered 2022-01-09: 20 mg via INTRAVENOUS
  Administered 2022-01-09: 40 mg via INTRAVENOUS
  Administered 2022-01-09: 20 mg via INTRAVENOUS
  Administered 2022-01-09: 40 mg via INTRAVENOUS

## 2022-01-09 MED ORDER — PROPOFOL 500 MG/50ML IV EMUL
INTRAVENOUS | Status: DC | PRN
  Administered 2022-01-09: 100 ug/kg/min via INTRAVENOUS

## 2022-01-09 SURGICAL SUPPLY — 25 items

## 2022-01-09 NOTE — H&P (Signed)
GASTROENTEROLOGY PROCEDURE H&P NOTE   Primary Care Physician: Dois Davenport, MD  HPI: Terry [name] is a 76 y.o. male who presents for EGD/Colonoscopy for evaluation of GERD, iron deficiency, change in bowel habits, IBS C/D, lower abdominal pain.  Past Medical History:  Diagnosis Date   Carotid artery stenosis 04/29/2018   Chronic tension headaches 04/29/2018   Essential hypertension 04/29/2018   GERD (gastroesophageal reflux disease) 04/29/2018   HLD (hyperlipidemia) 04/29/2018   Parkinson's disease ([facility]) 04/29/2018   Past Surgical History:  Procedure Laterality Date   ENDARTERECTOMY Left 04/27/2021   Procedure: LEFT CAROTID ENDARTERECTOMY;  Surgeon: Nada Libman, MD;  Location[facility] OR;  Service: Vascular;  Laterality: Left;   LEFT HEART CATH AND CORONARY ANGIOGRAPHY N/A 11/25/2019   Procedure: LEFT HEART CATH AND CORONARY ANGIOGRAPHY;  Surgeon: Lyn Records, MD;  [facility] INV[facility] CV LAB;  Service: Cardiovascular;  Laterality: N/A;   PATCH ANGIOPLASTY Left 04/27/2021   Procedure: PATCH ANGIOPLASTY USING Livia Snellen BIOLOGIC PATCH;  Surgeon: Nada Libman, MD;  Location: [facility] OR;  Service: Vascular;  Laterality: Left;   TONSILLECTOMY     Current Facility-Administered Medications  Medication Dose Route Frequency Provider Last Rate Last Admin   0.9 %  sodium chloride infusion   Intravenous Continuous Mansouraty, Netty Starring., MD       lactated ringers infusion   Intravenous Continuous Mansouraty, Netty Starring., MD        Current Facility-Administered Medications:    0.9 %  sodium chloride infusion, , Intravenous, Continuous, Mansouraty, Netty Starring., MD   lactated ringers infusion, , Intravenous, Continuous, Mansouraty, Netty Starring., MD Allergies  Allergen Reactions   Penicillins Rash   Erythromycin Rash   Clam Shell Nausea And Vomiting   Family History  Problem Relation Age of Onset   Heart disease Mother    Cancer Father    Parkinson's disease Sister    Colon cancer Neg  Hx    Stomach cancer Neg Hx    Esophageal cancer Neg Hx    Inflammatory bowel disease Neg Hx    Liver disease Neg Hx    Pancreatic cancer Neg Hx    Rectal cancer Neg Hx    Social History   Socioeconomic History   Marital status: Married    Spouse name: Not on file   Number of children: Not on file   Years of education: Not on file   Highest education level: Not on file  Occupational History   Not on file  Tobacco Use   Smoking status: Never    Passive exposure: Never   Smokeless tobacco: Former    Types: Snuff  Vaping Use   Vaping Use: Never used  Substance and Sexual Activity   Alcohol use: Yes    Comment: Occasionally.   Drug use: Never   Sexual activity: Not Currently  Other Topics Concern   Not on file  Social History Narrative   Not on file   Social Determinants of Health   Financial Resource Strain: Not on file  Food Insecurity: Not on file  Transportation Needs: Not on file  Physical Activity: Not on file  Stress: Not on file  Social Connections: Not on file  Intimate Partner Violence: Not on file    Physical Exam: There were no vitals filed for this visit. There is no height or weight on file to calculate BMI. GEN: NAD EYE: Sclerae anicteric ENT: MMM CV: Non-tachycardic GI: Soft, NT/ND NEURO:  Alert & Oriented x 3  Lab Results: No results for input(s): "WBC", "HGB", "HCT", "PLT" in the last 72 hours. BMET No results for input(s): "NA", "K", "CL", "CO2", "GLUCOSE", "BUN", "CREATININE", "CALCIUM" in the last 72 hours. LFT No results for input(s): "PROT", "ALBUMIN", "AST", "ALT", "ALKPHOS", "BILITOT", "BILIDIR", "IBILI" in the last 72 hours. PT/INR No results for input(s): "LABPROT", "INR" in the last 72 hours.   Impression / Plan: This is a 76 y.o.male who presents for EGD/Colonoscopy for evaluation of GERD, iron deficiency, change in bowel habits, IBS C/D, lower abdominal pain.  The risks and benefits of endoscopic evaluation/treatment were  discussed with the patient and/or family; these include but are not limited to the risk of perforation, infection, bleeding, missed lesions, lack of diagnosis, severe illness requiring hospitalization, as well as anesthesia and sedation related illnesses.  The patient's history has been reviewed, patient examined, no change in status, and deemed stable for procedure.  The patient and/or family is agreeable to proceed.    Corliss Parish, MD [facility] Advanced Endoscopy Office # [phone]

## 2022-01-09 NOTE — Transfer of Care (Signed)
Immediate Anesthesia Transfer of Care Note  Patient: Terry [name]  Procedure(s) Performed: COLONOSCOPY WITH PROPOFOL ESOPHAGOGASTRODUODENOSCOPY (EGD) WITH PROPOFOL BIOPSY  Patient Location: PACU  Anesthesia Type:MAC  Level of Consciousness: awake, alert , oriented and patient cooperative  Airway & Oxygen Therapy: Patient Spontanous Breathing and Patient connected to face mask oxygen  Post-op Assessment: Report given to RN, Post -op Vital signs reviewed and stable and Patient moving all extremities X 4  Post vital signs: Reviewed and stable  Last Vitals:  Vitals Value Taken Time  BP    Temp    Pulse    Resp    SpO2      Last Pain:  Vitals:   01/09/22 0804  TempSrc: Temporal  PainSc: 0-No pain         Complications: No notable events documented.

## 2022-01-09 NOTE — Op Note (Signed)
[facility] Patient Name: Terry [name] Procedure Date: 01/09/2022 MRN: [id number] Attending MD: Justice Britain , MD Date of Birth: March 02, 1946 CSN: [id number] Age: 76 Admit Type: Outpatient Procedure:                Colonoscopy Indications:              Screening for colorectal malignant neoplasm,                            Incidental - Lower abdominal pain, Incidental -                            Iron deficiency anemia (history of now improved),                            Incidental - Change in bowel habits (alternating                            constipation/diarrhea) Providers:                Justice Britain, MD, Carlyn Reichert, RN, William Dalton, Technician Referring MD:             Maebelle Munroe. Darron Doom, MD Medicines:                Monitored Anesthesia Care Complications:            No immediate complications. Estimated Blood Loss:     Estimated blood loss was minimal. Procedure:                Pre-Anesthesia Assessment:                           - Prior to the procedure, a History and Physical                            was performed, and patient medications and                            allergies were reviewed. The patient's tolerance of                            previous anesthesia was also reviewed. The risks                            and benefits of the procedure and the sedation                            options and risks were discussed with the patient.                            All questions were answered, and informed consent                            was obtained. Prior  Anticoagulants: The patient has                            taken no previous anticoagulant or antiplatelet                            agents except for aspirin. ASA Grade Assessment:                            III - A patient with severe systemic disease. After                            reviewing the risks and benefits, the patient was                             deemed in satisfactory condition to undergo the                            procedure.                           After obtaining informed consent, the colonoscope                            was passed under direct vision. Throughout the                            procedure, the patient's blood pressure, pulse, and                            oxygen saturations were monitored continuously. The                            CF-HQ190L ([id number] Olympus colonoscope was                            introduced through the anus and advanced to the 5                            cm into the ileum. The colonoscopy was performed                            without difficulty. The patient tolerated the                            procedure. The quality of the bowel preparation was                            good. The terminal ileum, ileocecal valve,                            appendiceal orifice, and rectum were photographed. Scope In: 10:09:40 AM Scope Out: 10:23:27 AM Scope Withdrawal Time: 0 hours 10 minutes 51 seconds  Total Procedure Duration: 0 hours 13 minutes  47 seconds  Findings:      The digital rectal exam findings include hemorrhoids. Pertinent       negatives include no palpable rectal lesions.      The terminal ileum and ileocecal valve appeared normal.      A few small-mouthed diverticula were found in the descending colon and       hepatic flexure.      Normal mucosa was found in the entire colon otherwise. Biopsies for       histology were taken with a cold forceps from the entire colon for       evaluation of microscopic colitis.      Non-bleeding non-thrombosed external and internal hemorrhoids were found       during retroflexion, during perianal exam and during digital exam. The       hemorrhoids were Grade II (internal hemorrhoids that prolapse but reduce       spontaneously). Impression:               - Hemorrhoids found on digital rectal exam.                           -  The examined portion of the ileum was normal.                           - Diverticulosis in the descending colon and at the                            hepatic flexure.                           - Normal mucosa in the entire examined colon                            otherwise. Biopsied.                           - Non-bleeding non-thrombosed external and internal                            hemorrhoids. Moderate Sedation:      Not Applicable - Patient had care per Anesthesia. Recommendation:           - The patient will be observed post-procedure,                            until all discharge criteria are met.                           - Discharge patient to home.                           - Patient has a contact number available for                            emergencies. The signs and symptoms of potential                            delayed  complications were discussed with the                            patient. Return to normal activities tomorrow.                            Written discharge instructions were provided to the                            patient.                           - High fiber diet.                           - Use FiberCon 1-2 tablets PO daily.                           - Continue present medications.                           - Await pathology results.                           - Repeat colonoscopy in 10 years for screening                            purposes if patient's health remains excellent can                            be considered, but typical aging out of screening                            will be more likely (can discuss in future).                           - The findings and recommendations were discussed                            with the patient.                           - The findings and recommendations were discussed                            with the patient's family. Procedure Code(s):        --- Professional ---                            [phone], Colonoscopy, flexible; with biopsy, single                            or multiple Diagnosis Code(s):        --- Professional ---  Z12.11, Encounter for screening for malignant                            neoplasm of colon                           K64.1, Second degree hemorrhoids                           K57.30, Diverticulosis of large intestine without                            perforation or abscess without bleeding CPT copyright 2019 [employer]. All rights reserved. The codes documented in this report are preliminary and upon coder review may  be revised to meet current compliance requirements. Justice Britain, MD 01/09/2022 10:44:28 AM Number of Addenda: 0

## 2022-01-09 NOTE — Op Note (Signed)
[facility] Patient Name: Terry [name] Procedure Date: 01/09/2022 MRN: [id number] Attending MD: Corliss Parish , MD Date of Birth: May 05, 1946 CSN: [id number] Age: 76 Admit Type: Outpatient Procedure:                Upper GI endoscopy Indications:              Iron deficiency anemia (previously now improved,                            Heartburn, Diarrhea/alternating Constipation Providers:                Corliss Parish, MD, Zoe Lan, RN, Kandice Robinsons, Technician Referring MD:              Medicines:                Monitored Anesthesia Care Complications:            No immediate complications. Estimated Blood Loss:     Estimated blood loss was minimal. Procedure:                Pre-Anesthesia Assessment:                           - Prior to the procedure, a History and Physical                            was performed, and patient medications and                            allergies were reviewed. The patient's tolerance of                            previous anesthesia was also reviewed. The risks                            and benefits of the procedure and the sedation                            options and risks were discussed with the patient.                            All questions were answered, and informed consent                            was obtained. Prior Anticoagulants: The patient has                            taken no previous anticoagulant or antiplatelet                            agents except for aspirin. ASA Grade Assessment:                            III -  A patient with severe systemic disease. After                            reviewing the risks and benefits, the patient was                            deemed in satisfactory condition to undergo the                            procedure.                           After obtaining informed consent, the endoscope was                            passed under  direct vision. Throughout the                            procedure, the patient's blood pressure, pulse, and                            oxygen saturations were monitored continuously. The                            [id number]-[id number] ([id number]) Olympus endoscope was introduced                            through the mouth, and advanced to the second part                            of duodenum. The upper GI endoscopy was                            accomplished without difficulty. The patient                            tolerated the procedure. Scope In: Scope Out: Findings:      No gross lesions were noted in the entire esophagus.      The Z-line was irregular and was found 41 cm from the incisors.      A 1 cm hiatal hernia was present.      Striped mildly erythematous mucosa was found in the entire examined       stomach. Biopsies were taken with a cold forceps for histology and       Helicobacter pylori testing.      An angulation deformity was found at the duodenal sweep.      No other gross lesions were noted in the duodenal bulb, in the first       portion of the duodenum and in the second portion of the duodenum.       Biopsies were taken with a cold forceps for histology. Impression:               - No gross lesions in esophagus. Z-line irregular,  41 cm from the incisors.                           - 1 cm hiatal hernia.                           - Erythematous mucosa in the stomach. Biopsied.                           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 No gross                            lesions in the duodenal bulb, in the first portion                            of the duodenum and in the second portion of the                            duodenum. Biopsied. Moderate Sedation:      Not Applicable - Patient had care per Anesthesia. Recommendation:           - Proceed to scheduled colonoscopy.                           - Continue present medications.                            - Await pathology results.                           - The findings and recommendations were discussed                            with the patient.                           - The findings and recommendations were discussed                            with the patient's family. Procedure Code(s):        --- Professional ---                           [phone], Esophagogastroduodenoscopy, flexible,                            transoral; with biopsy, single or multiple Diagnosis Code(s):        --- Professional ---                           K22.8, Other specified diseases of esophagus                           K44.9, Diaphragmatic hernia without obstruction or  gangrene                           K31.89, Other diseases of stomach and duodenum                           D50.9, Iron deficiency anemia, unspecified                           R12, Heartburn                           R19.7, Diarrhea, unspecified CPT copyright 2019 American Medical Association. All rights reserved. The codes documented in this report are preliminary and upon coder review may  be revised to meet current compliance requirements. Corliss Parish, MD 01/09/2022 10:39:17 AM Number of Addenda: 0

## 2022-01-09 NOTE — Discharge Instructions (Signed)
YOU HAD AN ENDOSCOPIC PROCEDURE TODAY: Refer to the procedure report and other information in the discharge instructions given to you for any specific questions about what was found during the examination. If this information does not answer your questions, please call [place] office at [phone] to clarify.  ° °YOU SHOULD EXPECT: Some feelings of bloating in the abdomen. Passage of more gas than usual. Walking can help get rid of the air that was put into your GI tract during the procedure and reduce the bloating. If you had a lower endoscopy (such as a colonoscopy or flexible sigmoidoscopy) you may notice spotting of blood in your stool or on the toilet paper. Some abdominal soreness may be present for a day or two, also. ° °DIET: Your first meal following the procedure should be a light meal and then it is ok to progress to your normal diet. A half-sandwich or bowl of soup is an example of a good first meal. Heavy or fried foods are harder to digest and may make you feel nauseous or bloated. Drink plenty of fluids but you should avoid alcoholic beverages for 24 hours. If you had a esophageal dilation, please see attached instructions for diet.   ° °ACTIVITY: Your care partner should take you home directly after the procedure. You should plan to take it easy, moving slowly for the rest of the day. You can resume normal activity the day after the procedure however YOU SHOULD NOT DRIVE, use power tools, machinery or perform tasks that involve climbing or major physical exertion for 24 hours (because of the sedation medicines used during the test).  ° °SYMPTOMS TO REPORT IMMEDIATELY: °A gastroenterologist can be reached at any hour. Please call [phone]  for any of the following symptoms:  °Following lower endoscopy (colonoscopy, flexible sigmoidoscopy) °Excessive amounts of blood in the stool  °Significant tenderness, worsening of abdominal pains  °Swelling of the abdomen that is new, acute  °Fever of 100° or  higher  °Following upper endoscopy (EGD, EUS, ERCP, esophageal dilation) °Vomiting of blood or coffee ground material  °New, significant abdominal pain  °New, significant chest pain or pain under the shoulder blades  °Painful or persistently difficult swallowing  °New shortness of breath  °Black, tarry-looking or red, bloody stools ° °FOLLOW UP:  °If any biopsies were taken you will be contacted by phone or by letter within the next 1-3 weeks. Call [phone]  if you have not heard about the biopsies in 3 weeks.  °Please also call with any specific questions about appointments or follow up tests. ° °

## 2022-01-09 NOTE — Anesthesia Postprocedure Evaluation (Signed)
Anesthesia Post Note  Patient: Rashaun Hazard  Procedure(s) Performed: COLONOSCOPY WITH PROPOFOL ESOPHAGOGASTRODUODENOSCOPY (EGD) WITH PROPOFOL BIOPSY     Patient location during evaluation: Endoscopy Anesthesia Type: MAC Level of consciousness: awake and alert Pain management: pain level controlled Vital Signs Assessment: post-procedure vital signs reviewed and stable Respiratory status: spontaneous breathing, nonlabored ventilation, respiratory function stable and patient connected to nasal cannula oxygen Cardiovascular status: stable and blood pressure returned to baseline Postop Assessment: no apparent nausea or vomiting Anesthetic complications: no   No notable events documented.  Last Vitals:  Vitals:   01/09/22 1050 01/09/22 1100  BP: (!) 191/93 (!) 193/90  Pulse: 73 74  Resp: 20 (!) 21  Temp:    SpO2: 99% 98%    Last Pain:  Vitals:   01/09/22 1100  TempSrc:   PainSc: 0-No pain                 Belenda Cruise P Ayush Boulet

## 2022-01-10 ENCOUNTER — Encounter (HOSPITAL_COMMUNITY): Payer: Self-pay | Admitting: Gastroenterology

## 2022-01-10 ENCOUNTER — Telehealth: Payer: Self-pay

## 2022-01-10 NOTE — Telephone Encounter (Signed)
LINQ alert received.  2 new pause events, 3.2-3.5sec in duration during waking hours. [employer] rep to contact patient regarding symptom activation, no symptom activation's.  Attempted to contact patient to assess s/s and medication compliance. No answer, LMTCB.

## 2022-01-10 NOTE — Telephone Encounter (Signed)
Spoke with patient, patient stated he had a couple of dizzy spells but doesn't recall date and time informed him that I had reached out to Caroleen with [employer] again to help patient with recording symptom episodes. Patient voiced understanding

## 2022-01-11 ENCOUNTER — Encounter: Payer: Self-pay | Admitting: Gastroenterology

## 2022-01-11 LAB — SURGICAL PATHOLOGY

## 2022-01-16 ENCOUNTER — Ambulatory Visit (INDEPENDENT_AMBULATORY_CARE_PROVIDER_SITE_OTHER): Payer: Medicare Other

## 2022-01-16 DIAGNOSIS — R55 Syncope and collapse: Secondary | ICD-10-CM | POA: Diagnosis not present

## 2022-01-16 LAB — CUP PACEART REMOTE DEVICE CHECK
Date Time Interrogation Session: 20230626075012
Implantable Pulse Generator Implant Date: 20230523
Pulse Gen Serial Number: 175044

## 2022-01-23 NOTE — Progress Notes (Deleted)
Check latitude 01/25/22 as monitor will update tonight

## 2022-01-25 ENCOUNTER — Telehealth: Payer: Self-pay

## 2022-01-25 NOTE — Telephone Encounter (Signed)
No event noted from January 23, 2022 at 7:30 am.  4 second pause noted January 24, 2022 at 2:47 pm.  Attempted to contact Pt to assess for symptoms.  Left message to call back.

## 2022-01-25 NOTE — Telephone Encounter (Signed)
-----   Message from Dorathy Daft, RN sent at 01/23/2022 10:25 AM EDT ----- Check 01/25/22 as monitor has already updated for today.

## 2022-01-26 NOTE — Telephone Encounter (Signed)
Spoke with patient, patient does not recall anything specific from that date and time but just noted being dizzy all day on 7/4 and 7/5 patient reported BPs < 100 systolic at certain times  of the day, encouraged patient to remain hydrated, apt scheduled with R. Ursuy on 02/01/22 to discuss dizzy/low BP

## 2022-01-29 NOTE — Progress Notes (Signed)
Cardiology Office Note Date:  02/01/2022  Patient ID:  Terry [name], [place] Feb 26, 1946, MRN [id number] PCP:  Dois Davenport, MD  Cardiologist:  Dr. Scharlene Gloss Electrophysiologist: Dr. Lalla Brothers    Chief Complaint:  reports of low BP and noted pauses on loop  History of Present Illness: Terry [name] is a 76 y.o. 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 Myra Gianotti), stroke, no obstructive CAD, HLD, Parkinsons disease,   He saw Dr. Scharlene Gloss Feb 2023, dizziness was mostly positional and por orthostatic sounding, he had markedly abnormal orthostatic vitals.  Ongoing weight loss being evaluated for mesenteric vascular disease He had a monitor with sinus pause that appeared to be vagal with P-P prolongation ahead, though with reports of syncope. Felt further evaluation was indicated and referred to EP. Though his symptoms more consistent w/o orthostatic and volume depletion. Advised better hydration and conpression  He comes in today to be seen for Dr. Lalla Brothers, last seen by him 12/13/21 for a loop implant to try and better see if there is a correlation in symptoms and his paroxysmal AVBlock  Device clinic has noted some pauses, though no patient symptom activated episodes. In d/w he reports some dizzy spells, though no particular times or specifics,  Another phone contact with him noted c/o being dizzy all day (not episodic) and low BPs at home  TODAY He comes accompanied by his wife. He feels quite good today, yesterday was another story. Today he is sturdy on his feel, made atrip into the house and back out to the car without his cane. Yesterday he felt weak, tired and unsteady all day. Some days he is weak/unsteady in the mornings, better in the afternoons. He does not notice a correlation in good/bad days with BPs  Currently he takes his BP AM and PM and reports it to his PMD and then he is advised weather or not to take his BP pills.  Amlodipine or valsartan. He thinks the threshold is if his SBP is better then 153.  He has not had syncope since his loop implant His neurologist recommended that he wear support stockings, his wife mentions that this did infact seem to help his symptoms, but he does not tend to actually wear them  He does have orthostatic dizziness He does NOT have episodic symptoms, no moments of dizziness/weakness He has days of feeling poorly all day long, or some days all morning    Device information [employer]Sci loop implanted 12/13/21   Past Medical History:  Diagnosis Date   Carotid artery stenosis 04/29/2018   Chronic tension headaches 04/29/2018   Essential hypertension 04/29/2018   GERD (gastroesophageal reflux disease) 04/29/2018   HLD (hyperlipidemia) 04/29/2018   Parkinson's disease (HCC) 04/29/2018    Past Surgical History:  Procedure Laterality Date   BIOPSY  01/09/2022   Procedure: BIOPSY;  Surgeon: Lemar Lofty., MD;  Location: [facility];  Service: Gastroenterology;;  EGD and COLON   COLONOSCOPY WITH PROPOFOL N/A 01/09/2022   Procedure: COLONOSCOPY WITH PROPOFOL;  Surgeon: Lemar Lofty., MD;  Location: [facility];  Service: Gastroenterology;  Laterality: N/A;   ENDARTERECTOMY Left 04/27/2021   Procedure: LEFT CAROTID ENDARTERECTOMY;  Surgeon: Nada Libman, MD;  [facility]: [facility];  Service: Vascular;  Laterality: Left;   ESOPHAGOGASTRODUODENOSCOPY (EGD) WITH PROPOFOL N/A 01/09/2022   Procedure: ESOPHAGOGASTRODUODENOSCOPY (EGD) WITH PROPOFOL;  Surgeon: Lemar Lofty., MD;  Location: [facility];  Service: Gastroenterology;  Laterality: N/A;  LEFT HEART CATH AND CORONARY ANGIOGRAPHY N/A 11/25/2019   Procedure: LEFT HEART CATH AND CORONARY ANGIOGRAPHY;  Surgeon: Lyn Records, MD;  [facility];  Service: Cardiovascular;  Laterality: N/A;   PATCH ANGIOPLASTY Left 04/27/2021   Procedure: PATCH ANGIOPLASTY USING Livia Snellen BIOLOGIC PATCH;  Surgeon:  Nada Libman, MD;  [facility];  Service: Vascular;  Laterality: Left;   TONSILLECTOMY      Current Outpatient Medications  Medication Sig Dispense Refill   amLODipine (NORVASC) 2.5 MG tablet Take 2.5 mg by mouth See admin instructions. Take blood pressure twice a day and send to the doctor via e-mail ,and she responds if need to take or not     aspirin EC 81 MG EC tablet Take 1 tablet (81 mg total) by mouth daily. Swallow whole. (Patient taking differently: Take 81 mg by mouth at bedtime. Swallow whole. 2200) 30 tablet 1   atorvastatin (LIPITOR) 20 MG tablet TAKE ONE TABLET BY MOUTH EVERY NIGHT AT BEDTIME (Patient taking differently: Take 20 mg by mouth at bedtime. 2200) 90 tablet 0   atropine 1 % ophthalmic solution Place 1 drop into both eyes See admin instructions. Alternate each eye every other day     carbidopa-levodopa (SINEMET CR) 50-200 MG tablet Take 1 tablet by mouth at bedtime. (Patient taking differently: Take 1 tablet by mouth at bedtime. 2200) 90 tablet 1   carbidopa-levodopa (SINEMET IR) 25-100 MG tablet Take 2.5 tablets by mouth 4 (four) times daily. 7am, 12pm, 5pm, 10pm     cetirizine (ZYRTEC) 10 MG tablet Take 10 mg by mouth as needed for allergies.      Cholecalciferol (VITAMIN D3) 1.25 MG (50000 UT) CAPS Take 50,000 Units by mouth once a week.     Cyanocobalamin (VITAMIN B-12 IJ) Inject 1,000 mcg as directed every 30 (thirty) days.     fluticasone (FLONASE) 50 MCG/ACT nasal spray Place 1 spray into both nostrils daily as needed for allergies.      linaclotide (LINZESS) 72 MCG capsule Take 1 capsule (72 mcg total) by mouth daily before breakfast. 30 capsule 2   meloxicam (MOBIC) 15 MG tablet Take 15 mg by mouth at bedtime as needed for pain.     NEUPRO 4 MG/24HR Place 1 patch onto the skin every morning.     olopatadine (PATADAY) 0.1 % ophthalmic solution Place 1 drop into both eyes daily as needed for allergies.     pantoprazole (PROTONIX) 40 MG tablet Take 1 tablet  (40 mg total) by mouth daily. 90 tablet 4   tiZANidine (ZANAFLEX) 2 MG tablet Take 2 mg by mouth daily as needed for muscle spasms.     traZODone (DESYREL) 50 MG tablet Take 1 tablet (50 mg total) by mouth at bedtime as needed for sleep. (Patient taking differently: Take 50 mg by mouth at bedtime.) 90 tablet 0   valsartan (DIOVAN) 40 MG tablet Take 20 mg by mouth as needed (Take blood pressure twice a day and send to the doctor via e-mail ,and she responds if need to take or not).     acetaminophen (TYLENOL) 500 MG tablet Take 500 mg by mouth every 6 (six) hours as needed for moderate pain or headache. (Patient not taking: Reported on 02/01/2022)     Na Sulfate-K Sulfate-Mg Sulf (SUPREP BOWEL PREP KIT) 17.5-3.13-1.6 GM/177ML SOLN Take 1 kit by mouth as directed. For colonoscopy prep (Patient not taking: Reported on 02/01/2022) 354 mL 0   nitroGLYCERIN (NITROSTAT) 0.4 MG SL  tablet Place 1 tablet (0.4 mg total) under the tongue every 5 (five) minutes as needed for chest pain. (Patient not taking: Reported on 02/01/2022) 90 tablet 3   sucralfate (CARAFATE) 1 g tablet Take 1 tablet (1 g total) by mouth 2 (two) times daily for 7 days. (Patient not taking: Reported on 01/02/2022) 60 tablet 2   No current facility-administered medications for this visit.    Allergies:   Penicillins, Erythromycin, and Clam shell   Social History:  The patient  reports that he has never smoked. He has never been exposed to tobacco smoke. He has quit using smokeless tobacco.  His smokeless tobacco use included snuff. He reports current alcohol use. He reports that he does not use drugs.   Family History:  The patient's family history includes Cancer in his father; Heart disease in his mother; Parkinson's disease in his sister.  ROS:  Please see the history of present illness.    All other systems are reviewed and otherwise negative.   PHYSICAL EXAM:  VS:  BP 120/62 (BP Location: Left Arm, Patient Position: Sitting, Cuff  Size: Normal)   Pulse 76   Ht 6' (1.829 m)   Wt 169 lb 3.2 oz (76.7 kg)   SpO2 97%   BMI 22.95 kg/m  BMI: Body mass index is 22.95 kg/m. Well nourished, well developed, in no acute distress HEENT: normocephalic, atraumatic Neck: no JVD, carotid bruits or masses Cardiac:   RRR; no significant murmurs, no rubs, or gallops Lungs:   CTA b/l, no wheezing, rhonchi or rales Abd: soft, nontender MS: no deformity or atrophy Ext: no edema Skin: warm and dry, no rash Neuro:  No gross deficits appreciated Psych: euthymic mood, full affect  ILR site is stable, no tethering or discomfort   EKG:  not done today  Device interrogation done today and reviewed by myself:  Battery is OK SR 74 today He has had a number of sinus pauses Longest 5.4 seconds may 26th He had a 3s pause 01/31/22 02: 29AM, slight sinus slowing leading to it, and ? If it comes out in a 2:1 AVblock, though V rates are very good in the 60's, perhaps a U wave. Other sinus pauses do happen in the day time, not with any suggestion of heart block None of them seem to correlate clearly with symptoms  Cardiac Telemetry 08/22/2021 Impression: 1. Episode of sinus pause 08/07/2021 @ 2:32 pm which possibly represent paroxysmal AV block (vagal mediated) due to lengthening of the P-P interval with dropped QRS complexes. Total duration 4.4 seconds.  2. Second degree AV block, Mobitz 1 present.  3. Brief SVT was present, likely atrial tachycardia (6 episodes, longest 9.5 seconds).    TTE 04/24/2021  1. Left ventricular ejection fraction, by estimation, is 65 to 70%. The  left ventricle has normal function. The left ventricle has no regional  wall motion abnormalities. There is moderate left ventricular hypertrophy.  Left ventricular diastolic  parameters are indeterminate.   2. Right ventricular systolic function is normal. The right ventricular  size is normal.   3. Left atrial size was mildly dilated.   4. Right atrial size was  mildly dilated.   5. The mitral valve is grossly normal. No evidence of mitral valve  regurgitation.   6. The aortic valve is tricuspid. Aortic valve regurgitation is not  visualized.    LHC 11/25/2019 Normal left ventricular function with EF 65%.  Normal LVEDP. Normal left main. Luminal irregularities in the proximal to  mid LAD less than 20%. Large ramus intermedius with luminal irregularities.  Widely patent. Dominant right coronary without evidence of obstruction or atherosclerosis.  Recent Labs: 04/24/2021: Magnesium 2.0 04/25/2021: TSH 1.272 08/15/2021: ALT <5; BUN 15; Creatinine, Ser 0.88; Hemoglobin 13.4; Platelets 197; Potassium 4.2; Sodium 136  04/28/2021: Cholesterol 78; HDL 37; LDL Cholesterol 31; Total CHOL/HDL Ratio 2.1; Triglycerides 52; VLDL 10   CrCl cannot be calculated (Patient's most recent lab result is older than the maximum 21 days allowed.).   Wt Readings from Last 3 Encounters:  02/01/22 169 lb 3.2 oz (76.7 kg)  01/09/22 170 lb (77.1 kg)  12/13/21 174 lb (78.9 kg)     Other studies reviewed: Additional studies/records reviewed today include: summarized above  ASSESSMENT AND PLAN:  Dizziness Syncope Orthostatic hypotension Pauses  I think his sinus pauses are incidental His symptoms are several hours if not all day feeling weak, unsteady, "wobbly" His is markedly orthostatic today with a drop in SBP with symptoms today (poor HR response) I think his parkinson's and BP are his primary culprits I would advise consideration in stopping his amlodipine His PMD very closely manages his BP in BID communication and management of his meds. They see her tomorrow and will discuss further there  I have advised better hydration Compression wear when OOB Symptom recognition and safety Use of symptom logging with his device  Disposition: F/u with loop as usual [place] in 6 weeks or so, sooner if needed  Current medicines are reviewed at length with the patient  today.  The patient did not have any concerns regarding medicines.  Norma Fredrickson, PA-C 02/01/2022 4:57 PM     [facility] [address] [place] [address] [phone] (office)  [phone] (fax)

## 2022-02-01 ENCOUNTER — Ambulatory Visit: Payer: Medicare Other | Admitting: Physician Assistant

## 2022-02-01 ENCOUNTER — Encounter: Payer: Self-pay | Admitting: Physician Assistant

## 2022-02-01 VITALS — BP 120/62 | HR 76 | Ht 72.0 in | Wt 169.2 lb

## 2022-02-01 DIAGNOSIS — R001 Bradycardia, unspecified: Secondary | ICD-10-CM | POA: Diagnosis not present

## 2022-02-01 DIAGNOSIS — I951 Orthostatic hypotension: Secondary | ICD-10-CM

## 2022-02-01 DIAGNOSIS — Z4509 Encounter for adjustment and management of other cardiac device: Secondary | ICD-10-CM

## 2022-02-01 DIAGNOSIS — R55 Syncope and collapse: Secondary | ICD-10-CM

## 2022-02-01 LAB — CUP PACEART INCLINIC DEVICE CHECK
Date Time Interrogation Session: 20230712184728
Implantable Pulse Generator Implant Date: 20230523
Pulse Gen Serial Number: 175044

## 2022-02-01 NOTE — Patient Instructions (Signed)
Medication Instructions:  Your physician recommends that you continue on your current medications as directed. Please refer to the Current Medication list given to you today.  DISCUSS AMLODIPINE WITH PRIMARY CARE DOCTOR TOMORROW *If you need a refill on your cardiac medications before your next appointment, please call your pharmacy*   Lab Work: NONE If you have labs (blood work) drawn today and your tests are completely normal, you will receive your results only by: MyChart Message (if you have MyChart) OR A paper copy in the mail If you have any lab test that is abnormal or we need to change your treatment, we will call you to review the results.   Testing/Procedures: NONE   Follow-Up: At [facility], you and your health needs are our priority.  As part of our continuing mission to provide you with exceptional heart care, we have created designated Provider Care Teams.  These Care Teams include your primary Cardiologist (physician) and Advanced Practice Providers (APPs -  Physician Assistants and Nurse Practitioners) who all work together to provide you with the care you need, when you need it.  We recommend signing up for the patient portal called "MyChart".  Sign up information is provided on this After Visit Summary.  MyChart is used to connect with patients for Virtual Visits (Telemedicine).  Patients are able to view lab/test results, encounter notes, upcoming appointments, etc.  Non-urgent messages can be sent to your provider as well.   To learn more about what you can do with MyChart, go to [email or web address].    Your next appointment:   6 week(s)  The format for your next appointment:   In Person  Provider:   Francis Dowse, PA-C    Other Instructions HYDRATE WELL AVOID ACTIVITIES ON "BAD DAYS" AVOID PROLONGED STANDING STAND SLOWLY USE SYMPTOM MARKER ON APP/PHONE    Important Information About Sugar

## 2022-02-09 NOTE — Progress Notes (Signed)
Carelink Summary Report / Loop Recorder 

## 2022-02-16 ENCOUNTER — Encounter: Payer: Self-pay | Admitting: Gastroenterology

## 2022-02-16 ENCOUNTER — Ambulatory Visit (INDEPENDENT_AMBULATORY_CARE_PROVIDER_SITE_OTHER): Payer: Medicare Other

## 2022-02-16 DIAGNOSIS — R55 Syncope and collapse: Secondary | ICD-10-CM

## 2022-02-17 LAB — CUP PACEART REMOTE DEVICE CHECK
Date Time Interrogation Session: 20230728130638
Implantable Pulse Generator Implant Date: 20230523
Pulse Gen Serial Number: 175044

## 2022-03-10 NOTE — Progress Notes (Signed)
Carelink Summary Report / Loop Recorder 

## 2022-03-15 ENCOUNTER — Encounter: Payer: Medicare Other | Admitting: Physician Assistant

## 2022-03-20 ENCOUNTER — Ambulatory Visit (INDEPENDENT_AMBULATORY_CARE_PROVIDER_SITE_OTHER): Payer: Medicare Other

## 2022-03-20 DIAGNOSIS — R55 Syncope and collapse: Secondary | ICD-10-CM

## 2022-03-20 LAB — CUP PACEART REMOTE DEVICE CHECK
Date Time Interrogation Session: 20230828093358
Implantable Pulse Generator Implant Date: 20230523
Pulse Gen Serial Number: 175044

## 2022-03-22 ENCOUNTER — Encounter: Payer: Self-pay | Admitting: Cardiology

## 2022-03-22 ENCOUNTER — Ambulatory Visit: Payer: Medicare Other | Attending: Cardiology | Admitting: Cardiology

## 2022-03-22 VITALS — BP 142/72 | HR 73 | Ht 72.0 in | Wt 169.6 lb

## 2022-03-22 DIAGNOSIS — G20 Parkinson's disease: Secondary | ICD-10-CM | POA: Diagnosis not present

## 2022-03-22 DIAGNOSIS — I442 Atrioventricular block, complete: Secondary | ICD-10-CM

## 2022-03-22 DIAGNOSIS — I951 Orthostatic hypotension: Secondary | ICD-10-CM

## 2022-03-22 DIAGNOSIS — R001 Bradycardia, unspecified: Secondary | ICD-10-CM

## 2022-03-22 DIAGNOSIS — R55 Syncope and collapse: Secondary | ICD-10-CM

## 2022-03-22 NOTE — Progress Notes (Deleted)
Electrophysiology Office Follow up Visit Note:    Date:  03/22/2022   ID:  Denys Salinger, DOB 1946-04-01, MRN [id number]  PCP:  Dois Davenport, MD  [facility] Cardiologist:  [name], MD  [facility] Electrophysiologist:  Lanier Prude, MD    Interval History:    Terry [name] is a 76 y.o. male who presents for a follow up visit. They were last seen in clinic February 01, 2022 by [facility].  I had last seen the patient Dec 13, 2021 and implanted a loop recorder for dizziness spells.  At the appointment with Luster Landsberg, he reported no episodic dizziness or syncope episodes.  He had not had a syncopal episode since the loop recorder implant.  Orthostatics were markedly positive during that appointment.       Past Medical History:  Diagnosis Date   Carotid artery stenosis 04/29/2018   Chronic tension headaches 04/29/2018   Essential hypertension 04/29/2018   GERD (gastroesophageal reflux disease) 04/29/2018   HLD (hyperlipidemia) 04/29/2018   Parkinson's disease (HCC) 04/29/2018    Past Surgical History:  Procedure Laterality Date   BIOPSY  01/09/2022   Procedure: BIOPSY;  Surgeon: Lemar Lofty., MD;  Location: [facility];  Service: Gastroenterology;;  EGD and COLON   COLONOSCOPY WITH PROPOFOL N/A 01/09/2022   Procedure: COLONOSCOPY WITH PROPOFOL;  Surgeon: Lemar Lofty., MD;  Location: [facility];  Service: Gastroenterology;  Laterality: N/A;   ENDARTERECTOMY Left 04/27/2021   Procedure: LEFT CAROTID ENDARTERECTOMY;  Surgeon: Nada Libman, MD;  Location: [facility];  Service: Vascular;  Laterality: Left;   ESOPHAGOGASTRODUODENOSCOPY (EGD) WITH PROPOFOL N/A 01/09/2022   Procedure: ESOPHAGOGASTRODUODENOSCOPY (EGD) WITH PROPOFOL;  Surgeon: Lemar Lofty., MD;  Location: [facility];  Service: Gastroenterology;  Laterality: N/A;   LEFT HEART CATH AND CORONARY ANGIOGRAPHY N/A 11/25/2019   Procedure: LEFT HEART CATH AND CORONARY ANGIOGRAPHY;  Surgeon: Lyn Records, MD;  [facility]: [facility];  Service: Cardiovascular;  Laterality: N/A;   PATCH ANGIOPLASTY Left 04/27/2021   Procedure: PATCH ANGIOPLASTY USING Livia Snellen BIOLOGIC PATCH;  Surgeon: Nada Libman, MD;  Location: [facility];  Service: Vascular;  Laterality: Left;   TONSILLECTOMY      Current Medications: No outpatient medications have been marked as taking for the 03/22/22 encounter (Appointment) with Lanier Prude, MD.     Allergies:   Penicillins, Erythromycin, and Clam shell   Social History   Socioeconomic History   Marital status: Married    Spouse name: Not on file   Number of children: Not on file   Years of education: Not on file   Highest education level: Not on file  Occupational History   Not on file  Tobacco Use   Smoking status: Never    Passive exposure: Never   Smokeless tobacco: Former    Types: Snuff  Vaping Use   Vaping Use: Never used  Substance and Sexual Activity   Alcohol use: Yes    Comment: Occasionally.   Drug use: Never   Sexual activity: Not Currently  Other Topics Concern   Not on file  Social History Narrative   Not on file   Social Determinants of Health   Financial Resource Strain: Not on file  Food Insecurity: Not on file  Transportation Needs: Not on file  Physical Activity: Not on file  Stress: Not on file  Social Connections: Not on file     Family History: The patient's family history includes Cancer in his father;  Heart disease in his mother; Parkinson's disease in his sister. There is no history of Colon cancer, Stomach cancer, Esophageal cancer, Inflammatory bowel disease, Liver disease, Pancreatic cancer, or Rectal cancer.  ROS:   Please see the history of present illness.    All other systems reviewed and are negative.  EKGs/Labs/Other Studies Reviewed:    The following studies were reviewed today:  Loop recorder interrogations reviewed    Recent Labs: 04/24/2021: Magnesium 2.0 04/25/2021: TSH  1.272 08/15/2021: ALT <5; BUN 15; Creatinine, Ser 0.88; Hemoglobin 13.4; Platelets 197; Potassium 4.2; Sodium 136  Recent Lipid Panel    Component Value Date/Time   CHOL 78 04/28/2021 0445   CHOL 99 (L) 12/10/2019 1112   TRIG 52 04/28/2021 0445   HDL 37 (L) 04/28/2021 0445   HDL 47 12/10/2019 1112   CHOLHDL 2.1 04/28/2021 0445   VLDL 10 04/28/2021 0445   LDLCALC 31 04/28/2021 0445   LDLCALC 39 12/10/2019 1112    Physical Exam:    VS:  There were no vitals taken for this visit.    Wt Readings from Last 3 Encounters:  02/01/22 169 lb 3.2 oz (76.7 kg)  01/09/22 170 lb (77.1 kg)  12/13/21 174 lb (78.9 kg)     GEN: *** Well nourished, well developed in no acute distress HEENT: Normal NECK: No JVD; No carotid bruits LYMPHATICS: No lymphadenopathy CARDIAC: ***RRR, no murmurs, rubs, gallops RESPIRATORY:  Clear to auscultation without rales, wheezing or rhonchi  ABDOMEN: Soft, non-tender, non-distended MUSCULOSKELETAL:  No edema; No deformity  SKIN: Warm and dry NEUROLOGIC:  Alert and oriented x 3 PSYCHIATRIC:  Normal affect        ASSESSMENT:    1. Orthostatic hypotension   2. Syncope and collapse   3. Bradycardia   4. Parkinson's disease (HCC)   5. Heart block AV complete (HCC)    PLAN:    In order of problems listed above:  #Orthostatic hypotension #Syncope #Parkinson's disease I suspect the majority of his symptoms are related to his Parkinson's and associated orthostatic intolerance.  I do not think there is a clear correlation between his symptoms in the past and AV conduction abnormalities.  We will continue loop recorder monitoring for any evidence of sustained AV conduction abnormalities.  Discussed pathophysiology of orthostatic intolerance during today's clinic appointment.  Would recommend compression stockings, adequate hydration, slow transitions between laying flat/sitting/standing.  Follow-up 1 year with APP.     Total time spent with patient  today *** minutes. This includes reviewing records, evaluating the patient and coordinating care.   Medication Adjustments/Labs and Tests Ordered: Current medicines are reviewed at length with the patient today.  Concerns regarding medicines are outlined above.  No orders of the defined types were placed in this encounter.  No orders of the defined types were placed in this encounter.    Signed, Steffanie Dunn, MD, [facility], [facility] 03/22/2022 5:47 AM    Electrophysiology [facility]

## 2022-03-22 NOTE — Progress Notes (Signed)
Electrophysiology Office Follow up Visit Note:    Date:  03/22/2022   ID:  Terry [name], DOB [birth year], MRN [id number]  PCP:  Dois Davenport, MD  [facility] Cardiologist:  [name], MD  [facility] Electrophysiologist:  Lanier Prude, MD    Interval History:    Terry [name] is a 76 y.o. male who presents for a follow up visit. They were last seen in clinic February 01, 2022 by [facility].  I had last seen the patient Dec 13, 2021 and implanted a loop recorder for dizziness spells.  At the appointment with Luster Landsberg, he reported no episodic dizziness or syncope episodes.  He had not had a syncopal episode since the loop recorder implant.  Orthostatics were markedly positive during that appointment.  His blood pressure is "up and down," and he has been feeling lightheaded. However, he has no longer been passing out. His wife attributes this to hydrating more.  He feels consistently fatigued. He no longer has the energy he used to several years ago. Two years ago, he could walk for several miles. Now, he can hardly walk a block.  He has lost 30 pounds, while putting in no effort to lose weight.  His voice has changed.  He has recently ordered home physical therapy, which has helped slightly.   He is on a waiting list to go into a retirement home.  He tried wearing an abdominal binder, but it was too uncomfortable so he has gone back to wearing support hose.   He denies any palpitations, chest pain, shortness of breath, or peripheral edema. No headaches, syncope, orthopnea, or PND.   Past Medical History:  Diagnosis Date   Carotid artery stenosis 04/29/2018   Chronic tension headaches 04/29/2018   Essential hypertension 04/29/2018   GERD (gastroesophageal reflux disease) 04/29/2018   HLD (hyperlipidemia) 04/29/2018   Parkinson's disease (HCC) 04/29/2018    Past Surgical History:  Procedure Laterality Date   BIOPSY  01/09/2022   Procedure: BIOPSY;  Surgeon: Lemar Lofty., MD;  Location: [facility];  Service: Gastroenterology;;  EGD and COLON   COLONOSCOPY WITH PROPOFOL N/A 01/09/2022   Procedure: COLONOSCOPY WITH PROPOFOL;  Surgeon: Lemar Lofty., MD;  Location: [facility];  Service: Gastroenterology;  Laterality: N/A;   ENDARTERECTOMY Left 04/27/2021   Procedure: LEFT CAROTID ENDARTERECTOMY;  Surgeon: Nada Libman, MD;  Location: [facility] OR;  Service: Vascular;  Laterality: Left;   ESOPHAGOGASTRODUODENOSCOPY (EGD) WITH PROPOFOL N/A 01/09/2022   Procedure: ESOPHAGOGASTRODUODENOSCOPY (EGD) WITH PROPOFOL;  Surgeon: Lemar Lofty., MD;  Location: [facility]OSCOPY;  Service: Gastroenterology;  Laterality: N/A;   LEFT HEART CATH AND CORONARY ANGIOGRAPHY N/A 11/25/2019   Procedure: LEFT HEART CATH AND CORONARY ANGIOGRAPHY;  Surgeon: Lyn Records, MD;  Location: [facility] INVASIVE CV LAB;  Service: Cardiovascular;  Laterality: N/A;   PATCH ANGIOPLASTY Left 04/27/2021   Procedure: PATCH ANGIOPLASTY USING Livia Snellen BIOLOGIC PATCH;  Surgeon: Nada Libman, MD;  Location: [facility] OR;  Service: Vascular;  Laterality: Left;   TONSILLECTOMY      Current Medications: Current Meds  Medication Sig   acetaminophen (TYLENOL) 500 MG tablet Take 500 mg by mouth every 6 (six) hours as needed for moderate pain or headache.   amLODipine (NORVASC) 2.5 MG tablet Take 2.5 mg by mouth See admin instructions. Take blood pressure twice a day and send to the doctor via e-mail ,and she responds if need to take or not   aspirin EC 81 MG EC tablet Take  1 tablet (81 mg total) by mouth daily. Swallow whole.   atorvastatin (LIPITOR) 20 MG tablet TAKE ONE TABLET BY MOUTH EVERY NIGHT AT BEDTIME   atropine 1 % ophthalmic solution Place 1 drop under the tongue See admin instructions. As needed   carbidopa-levodopa (SINEMET CR) 50-200 MG tablet Take 1 tablet by mouth at bedtime.   carbidopa-levodopa (SINEMET IR) 25-100 MG tablet Take 2.5 tablets by mouth 4 (four) times daily. 7am,  12pm, 5pm, 10pm   cetirizine (ZYRTEC) 10 MG tablet Take 10 mg by mouth as needed for allergies.    Cholecalciferol (VITAMIN D3) 1.25 MG (50000 UT) CAPS Take 50,000 Units by mouth once a week.   Cyanocobalamin (VITAMIN B-12 IJ) Inject 1,000 mcg as directed every 30 (thirty) days.   fluticasone (FLONASE) 50 MCG/ACT nasal spray Place 1 spray into both nostrils daily as needed for allergies.    linaclotide (LINZESS) 72 MCG capsule Take 1 capsule (72 mcg total) by mouth daily before breakfast.   meloxicam (MOBIC) 15 MG tablet Take 15 mg by mouth at bedtime as needed for pain.   NEUPRO 4 MG/24HR Place 1 patch onto the skin every morning.   olopatadine (PATADAY) 0.1 % ophthalmic solution Place 1 drop into both eyes daily as needed for allergies.   pantoprazole (PROTONIX) 40 MG tablet Take 1 tablet (40 mg total) by mouth daily.   sucralfate (CARAFATE) 1 g tablet Take 1 tablet (1 g total) by mouth 2 (two) times daily for 7 days. (Patient taking differently: Take 1 g by mouth daily.)   tiZANidine (ZANAFLEX) 2 MG tablet Take 2 mg by mouth daily as needed for muscle spasms.   traZODone (DESYREL) 50 MG tablet Take 1 tablet (50 mg total) by mouth at bedtime as needed for sleep. (Patient taking differently: Take 50 mg by mouth at bedtime.)   valsartan (DIOVAN) 40 MG tablet Take 20 mg by mouth as needed (Take blood pressure twice a day and send to the doctor via e-mail ,and she responds if need to take or not).     Allergies:   Penicillins, Erythromycin, and Clam shell   Social History   Socioeconomic History   Marital status: Married    Spouse name: Not on file   Number of children: Not on file   Years of education: Not on file   Highest education level: Not on file  Occupational History   Not on file  Tobacco Use   Smoking status: Never    Passive exposure: Never   Smokeless tobacco: Former    Types: Snuff  Vaping Use   Vaping Use: Never used  Substance and Sexual Activity   Alcohol use: Yes     Comment: Occasionally.   Drug use: Never   Sexual activity: Not Currently  Other Topics Concern   Not on file  Social History Narrative   Not on file   Social Determinants of Health   Financial Resource Strain: Not on file  Food Insecurity: Not on file  Transportation Needs: Not on file  Physical Activity: Not on file  Stress: Not on file  Social Connections: Not on file     Family History: The patient's family history includes Cancer in his father; Heart disease in his mother; Parkinson's disease in his sister. There is no history of Colon cancer, Stomach cancer, Esophageal cancer, Inflammatory bowel disease, Liver disease, Pancreatic cancer, or Rectal cancer.  ROS:   Please see the history of present illness.  (+) Lightheadedness (+)Fatigue  All other systems reviewed and are negative.  EKGs/Labs/Other Studies Reviewed:    The following studies were reviewed today:  Loop recorder interrogations reviewed No prolonged pauses.  No evidence of AV conduction abnormalities   August 15, 2021 Cardiac Telemetry:  Impression: 1. Episode of sinus pause 08/07/2021 @ 2:32 pm which possibly represent paroxysmal AV block (vagal mediated) due to lengthening of the P-P interval with dropped QRS complexes. Total duration 4.4 seconds.  2. Second degree AV block, Mobitz 1 present.  3. Brief SVT was present, likely atrial tachycardia (6 episodes, longest 9.5 seconds).   June 27, 2021 Mesenteric Limited:  Mesenteric:     Velocities in the proximal SMA are in the >70% stenosis range.  Velocities in the proximal IMA are in the >70% stenosis range.   April 24, 2021 Echo:  1. Left ventricular ejection fraction, by estimation, is 65 to 70%. The  left ventricle has normal function. The left ventricle has no regional  wall motion abnormalities. There is moderate left ventricular hypertrophy.  Left ventricular diastolic  parameters are indeterminate.   2. Right ventricular  systolic function is normal. The right ventricular  size is normal.   3. Left atrial size was mildly dilated.   4. Right atrial size was mildly dilated.   5. The mitral valve is grossly normal. No evidence of mitral valve  regurgitation.   6. The aortic valve is tricuspid. Aortic valve regurgitation is not  visualized.   Comparison(s): Prior images reviewed side by side. No significant change  in LVEF.   Dec 19, 2019  Nov 25, 2019 Left heart Cath:  Normal left ventricular function with EF 65%.  Normal LVEDP. Normal left main. Luminal irregularities in the proximal to mid LAD less than 20%. Large ramus intermedius with luminal irregularities.  Widely patent.  EKG: EKG is personally reviewed    Recent Labs: 04/24/2021: Magnesium 2.0 04/25/2021: TSH 1.272 08/15/2021: ALT <5; BUN 15; Creatinine, Ser 0.88; Hemoglobin 13.4; Platelets 197; Potassium 4.2; Sodium 136  Recent Lipid Panel    Component Value Date/Time   CHOL 78 04/28/2021 0445   CHOL 99 (L) 12/10/2019 1112   TRIG 52 04/28/2021 0445   HDL 37 (L) 04/28/2021 0445   HDL 47 12/10/2019 1112   CHOLHDL 2.1 04/28/2021 0445   VLDL 10 04/28/2021 0445   LDLCALC 31 04/28/2021 0445   LDLCALC 39 12/10/2019 1112    Physical Exam:    VS:  BP (!) 146/78   Pulse 73   Ht 6' (1.829 m)   Wt 169 lb 9.6 oz (76.9 kg)   SpO2 97%   BMI 23.00 kg/m     Wt Readings from Last 3 Encounters:  03/22/22 169 lb 9.6 oz (76.9 kg)  02/01/22 169 lb 3.2 oz (76.7 kg)  01/09/22 170 lb (77.1 kg)     GEN:  Well nourished, well developed in no acute distress HEENT: Normal.  Soft voice NECK: No JVD; No carotid bruits LYMPHATICS: No lymphadenopathy CARDIAC: RRR, no murmurs, rubs, gallops RESPIRATORY:  Clear to auscultation without rales, wheezing or rhonchi  ABDOMEN: Soft, non-tender, non-distended MUSCULOSKELETAL:  No edema; No deformity  SKIN: Warm and dry NEUROLOGIC:  Alert and oriented x 3 PSYCHIATRIC:  Normal affect         ASSESSMENT:    1. Orthostatic hypotension   2. Syncope and collapse   3. Bradycardia   4. Parkinson's disease (HCC)   5. Heart block AV complete (HCC)    PLAN:  In order of problems listed above:  #Orthostatic hypotension #Syncope #Parkinson's disease I suspect the majority of his symptoms are related to his Parkinson's and associated orthostatic intolerance.  I do not think there is a clear correlation between his symptoms in the past and AV conduction abnormalities.  We will continue loop recorder monitoring for any evidence of sustained AV conduction abnormalities.  Discussed pathophysiology of orthostatic intolerance during today's clinic appointment.  Would recommend compression stockings, adequate hydration, slow transitions between laying flat/sitting/standing.  Follow-up 6 months with APP.  Discussed continuing to wear support hose with the patient.      Medication Adjustments/Labs and Tests Ordered: Current medicines are reviewed at length with the patient today.  Concerns regarding medicines are outlined above.  No orders of the defined types were placed in this encounter.  No orders of the defined types were placed in this encounter.   I,Mary Shauna Hugh Buren,acting as a scribe for Lanier Prude, MD.,have documented all relevant documentation on the behalf of Lanier Prude, MD,as directed by  Lanier Prude, MD while in the presence of Lanier Prude, MD.   I, Lanier Prude, MD, have reviewed all documentation for this visit. The documentation on 03/22/22 for the exam, diagnosis, procedures, and orders are all accurate and complete.    Signed, Steffanie Dunn, MD, [facility], [facility] 03/22/2022 2:00 PM    Electrophysiology [facility]

## 2022-03-22 NOTE — Patient Instructions (Signed)
Medication Instructions:  none *If you need a refill on your cardiac medications before your next appointment, please call your pharmacy*   Lab Work: none If you have labs (blood work) drawn today and your tests are completely normal, you will receive your results only by: MyChart Message (if you have MyChart) OR A paper copy in the mail If you have any lab test that is abnormal or we need to change your treatment, we will call you to review the results.   Testing/Procedures: none   Follow-Up: At [facility], you and your health needs are our priority.  As part of our continuing mission to provide you with exceptional heart care, we have created designated Provider Care Teams.  These Care Teams include your primary Cardiologist (physician) and Advanced Practice Providers (APPs -  Physician Assistants and Nurse Practitioners) who all work together to provide you with the care you need, when you need it.  We recommend signing up for the patient portal called "MyChart".  Sign up information is provided on this After Visit Summary.  MyChart is used to connect with patients for Virtual Visits (Telemedicine).  Patients are able to view lab/test results, encounter notes, upcoming appointments, etc.  Non-urgent messages can be sent to your provider as well.   To learn more about what you can do with MyChart, go to [email or web address].    Your next appointment:   6 month(s)  The format for your next appointment:   In Person  Provider:   You will see one of the following Advanced Practice Providers on your designated Care Team:   Renee Ursuy, PA-C Michael "Andy" Tillery, PA-C      Other Instructions none  Important Information About Sugar       

## 2022-04-07 ENCOUNTER — Other Ambulatory Visit: Payer: Self-pay | Admitting: Gastroenterology

## 2022-04-13 NOTE — Progress Notes (Signed)
Carelink Summary Report / Loop Recorder 

## 2022-05-04 ENCOUNTER — Encounter: Payer: Self-pay | Admitting: Neurology

## 2022-05-04 ENCOUNTER — Ambulatory Visit: Payer: Medicare Other | Admitting: Neurology

## 2022-05-04 ENCOUNTER — Telehealth: Payer: Self-pay | Admitting: Neurology

## 2022-05-04 NOTE — Telephone Encounter (Signed)
Patient has multiple appointments with 3 other neurologists pending and recent appointment in August 2023 with Dr. Olegario Messier (and 2 appointments pending). He NS today for me. If he calls back to reschedule, please advise patient to follow up as scheduled with his other neurologists, no need to see 4 different neurologists for one issue, I am sure he will agree. Let me know, if he has any questions regarding this recommendation.

## 2022-05-04 NOTE — Telephone Encounter (Signed)
Noted  

## 2022-05-23 ENCOUNTER — Ambulatory Visit (INDEPENDENT_AMBULATORY_CARE_PROVIDER_SITE_OTHER): Payer: Medicare Other | Admitting: Gastroenterology

## 2022-05-23 ENCOUNTER — Encounter: Payer: Self-pay | Admitting: Gastroenterology

## 2022-05-23 VITALS — BP 104/60 | HR 68 | Ht 69.0 in | Wt 165.2 lb

## 2022-05-23 DIAGNOSIS — R194 Change in bowel habit: Secondary | ICD-10-CM | POA: Diagnosis not present

## 2022-05-23 DIAGNOSIS — K582 Mixed irritable bowel syndrome: Secondary | ICD-10-CM

## 2022-05-23 DIAGNOSIS — R1030 Lower abdominal pain, unspecified: Secondary | ICD-10-CM

## 2022-05-23 NOTE — Patient Instructions (Signed)
Try Miralax 1 capful dissloved in at least 8 ounces of water every other day for 1 week, then take 1 capful daily for 2 weeks. If no change in 2 weeks, then try Linzess 72 mcg - 2 pills daily.   Send my chart message to let us know how your doing. We will see you back in the office in 2-3 months, sooner if needed. Office will contact you to schedule at a later time.   _______________________________________________________  If you are age 76 or older, your body mass index should be between 23-30. Your Body mass index is 24.4 kg/m. If this is out of the aforementioned range listed, please consider follow up with your Primary Care Provider.  If you are age 52 or younger, your body mass index should be between 19-25. Your Body mass index is 24.4 kg/m. If this is out of the aformentioned range listed, please consider follow up with your Primary Care Provider.   ________________________________________________________  The [facility] providers would like to encourage you to use [facility] to communicate with providers for non-urgent requests or questions.  Due to long hold times on the telephone, sending your provider a message by [facility] may be a faster and more efficient way to get a response.  Please allow 48 business hours for a response.  Please remember that this is for non-urgent requests.  _______________________________________________________   Thank you for choosing me and [facility].  Dr. Rush Landmark

## 2022-05-23 NOTE — Progress Notes (Signed)
GASTROENTEROLOGY OUTPATIENT CLINIC VISIT   Primary Care Provider Dois Davenport, MD [address] [place] [address] [place] [address] [phone]  Patient Profile: Terry [name] is a 76 y.o. male with a pmh significant for Parkinson's disease, CAD, status post prior CVA, carotid artery disease (status post endarterectomy), hypertension, hyperlipidemia, renal artery stenosis, GERD, previous IDA (improved after iron supplementation), hemorrhoids, diverticulosis, IBS-C/D.  The patient presents to the [facility] for an evaluation and management of problem(s) noted below:  Problem List 1. Irritable bowel syndrome with both constipation and diarrhea   2. Change in bowel habits   3. Lower abdominal pain     History of Present Illness Please see prior notes for full details of HPI.  Interval History The patient returns for follow-up today.  I last saw him at the time of his upper and lower endoscopy.  Patient states that he is experiencing more constipation currently.  He is going up to 4 days without a bowel movement.  When he gets towards day 3 or day 4 he has that lower abdominal discomfort and bloating sensation recur.  Eventually as he passes a bowel movement he has relief of that and does well.  Eventually his body will allow his bowels to move.  The patient has not noted any blood in his stools.  Otherwise no other significant issues are occurring at this time.  GI Review of Systems Positive as above Negative for dysphagia, odynophagia, nausea, vomiting, melena, hematochezia   Review of Systems General: Denies fevers/chills/weight loss unintentionally Cardiovascular: Denies chest pain Pulmonary: Denies shortness of breath Gastroenterological: See HPI Genitourinary: Denies darkened urine or hematuria Hematological: Denies easy bruising/bleeding Dermatological: Denies jaundice Psychological: Mood is stable   Medications Current Outpatient Medications   Medication Sig Dispense Refill   acetaminophen (TYLENOL) 500 MG tablet Take 500 mg by mouth every 6 (six) hours as needed for moderate pain or headache.     amLODipine (NORVASC) 2.5 MG tablet Take 2.5 mg by mouth See admin instructions. Take blood pressure twice a day and send to the doctor via e-mail ,and she responds if need to take or not     aspirin EC 81 MG EC tablet Take 1 tablet (81 mg total) by mouth daily. Swallow whole. 30 tablet 1   atorvastatin (LIPITOR) 20 MG tablet TAKE ONE TABLET BY MOUTH EVERY NIGHT AT BEDTIME 90 tablet 0   atropine 1 % ophthalmic solution Place 1 drop under the tongue See admin instructions. As needed     brimonidine-timolol (COMBIGAN) 0.2-0.5 % ophthalmic solution Apply 1 drop to eye 2 (two) times daily.     carbidopa-levodopa (SINEMET CR) 50-200 MG tablet Take 1 tablet by mouth at bedtime. 90 tablet 1   carbidopa-levodopa (SINEMET IR) 25-100 MG tablet Take 2.5 tablets by mouth 4 (four) times daily. 7am, 12pm, 5pm, 10pm     cetirizine (ZYRTEC) 10 MG tablet Take 10 mg by mouth as needed for allergies.      Cholecalciferol (VITAMIN D3) 1.25 MG (50000 UT) CAPS Take 50,000 Units by mouth once a week.     Cyanocobalamin (VITAMIN B-12 IJ) Inject 1,000 mcg as directed every 30 (thirty) days.     fluticasone (FLONASE) 50 MCG/ACT nasal spray Place 1 spray into both nostrils daily as needed for allergies.      LINZESS 72 MCG capsule TAKE 1 CAPSULE BY MOUTH DAILY BEFORE BREAKFAST. 30 capsule 2   meloxicam (MOBIC) 15 MG tablet Take 15 mg by mouth at  bedtime as needed for pain.     NEUPRO 4 MG/24HR Place 1 patch onto the skin every morning.     olopatadine (PATADAY) 0.1 % ophthalmic solution Place 1 drop into both eyes daily as needed for allergies.     pantoprazole (PROTONIX) 40 MG tablet Take 1 tablet (40 mg total) by mouth daily. 90 tablet 4   tiZANidine (ZANAFLEX) 2 MG tablet Take 2 mg by mouth daily as needed for muscle spasms.     traZODone (DESYREL) 50 MG tablet Take  1 tablet (50 mg total) by mouth at bedtime as needed for sleep. (Patient taking differently: Take 50 mg by mouth at bedtime.) 90 tablet 0   valsartan (DIOVAN) 40 MG tablet Take 20 mg by mouth as needed (Take blood pressure twice a day and send to the doctor via e-mail ,and she responds if need to take or not).     No current facility-administered medications for this visit.    Allergies Allergies  Allergen Reactions   Penicillins Rash   Erythromycin Rash   Clam Shell Nausea And Vomiting    Histories Past Medical History:  Diagnosis Date   Carotid artery stenosis 04/29/2018   Chronic tension headaches 04/29/2018   Essential hypertension 04/29/2018   GERD (gastroesophageal reflux disease) 04/29/2018   HLD (hyperlipidemia) 04/29/2018   Parkinson's disease 04/29/2018   Past Surgical History:  Procedure Laterality Date   BIOPSY  01/09/2022   Procedure: BIOPSY;  Surgeon: Lemar Lofty., MD;  Location: [facility];  Service: Gastroenterology;;  EGD and COLON   COLONOSCOPY WITH PROPOFOL N/A 01/09/2022   Procedure: COLONOSCOPY WITH PROPOFOL;  Surgeon: Lemar Lofty., MD;  Location: [facility];  Service: Gastroenterology;  Laterality: N/A;   ENDARTERECTOMY Left 04/27/2021   Procedure: LEFT CAROTID ENDARTERECTOMY;  Surgeon: Nada Libman, MD;  Location: [facility] OR;  Service: Vascular;  Laterality: Left;   ESOPHAGOGASTRODUODENOSCOPY (EGD) WITH PROPOFOL N/A 01/09/2022   Procedure: ESOPHAGOGASTRODUODENOSCOPY (EGD) WITH PROPOFOL;  Surgeon: Lemar Lofty., MD;  Location: [facility];  Service: Gastroenterology;  Laterality: N/A;   LEFT HEART CATH AND CORONARY ANGIOGRAPHY N/A 11/25/2019   Procedure: LEFT HEART CATH AND CORONARY ANGIOGRAPHY;  Surgeon: Lyn Records, MD;  Location: [facility];  Service: Cardiovascular;  Laterality: N/A;   PATCH ANGIOPLASTY Left 04/27/2021   Procedure: PATCH ANGIOPLASTY USING Livia Snellen BIOLOGIC PATCH;  Surgeon: Nada Libman, MD;   Location: [facility] OR;  Service: Vascular;  Laterality: Left;   TONSILLECTOMY     Social History   Socioeconomic History   Marital status: Married    Spouse name: Not on file   Number of children: Not on file   Years of education: Not on file   Highest education level: Not on file  Occupational History   Not on file  Tobacco Use   Smoking status: Never    Passive exposure: Never   Smokeless tobacco: Former    Types: Snuff  Vaping Use   Vaping Use: Never used  Substance and Sexual Activity   Alcohol use: Yes    Comment: Occasionally.   Drug use: Never   Sexual activity: Not Currently  Other Topics Concern   Not on file  Social History Narrative   Not on file   Social Determinants of Health   Financial Resource Strain: Not on file  Food Insecurity: Not on file  Transportation Needs: Not on file  Physical Activity: Not on file  Stress: Not on file  Social Connections: Not on file  Intimate Partner Violence: Not on file   Family History  Problem Relation Age of Onset   Heart disease Mother    Cancer Father    Parkinson's disease Sister    Colon cancer Neg Hx    Stomach cancer Neg Hx    Esophageal cancer Neg Hx    Inflammatory bowel disease Neg Hx    Liver disease Neg Hx    Pancreatic cancer Neg Hx    Rectal cancer Neg Hx    I have reviewed his medical, social, and family history in detail and updated the electronic medical record as necessary.    PHYSICAL EXAMINATION  BP 104/60 (BP Location: Left Arm, Patient Position: Sitting, Cuff Size: Normal)   Pulse 68   Ht 5\' 9"  (1.753 m) Comment: height measured without shoes  Wt 165 lb 4 oz (75 kg)   BMI 24.40 kg/m  Wt Readings from Last 3 Encounters:  05/23/22 165 lb 4 oz (75 kg)  03/22/22 169 lb 9.6 oz (76.9 kg)  02/01/22 169 lb 3.2 oz (76.7 kg)  GEN: NAD, appears stated age, doesn't appear chronically ill PSYCH: Cooperative, without pressured speech EYE: Conjunctivae pink, sclerae anicteric ENT: MMM CV:  Nontachycardic RESP: No audible wheezing GI: NABS, soft, NT/ND, without rebound MSK/EXT: No lower extremity edema SKIN: No jaundice NEURO:  Alert & Oriented x 3, no focal deficits's   REVIEW OF DATA  I reviewed the following data at the time of this encounter:  GI Procedures and Studies  June 2023 EGD - No gross lesions in esophagus. Z-line irregular, 41 cm from the incisors. - 1 cm hiatal hernia. - Erythematous mucosa in the stomach. Biopsied. 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 No gross lesions in the duodenal bulb, in the first portion of the duodenum and in the second portion of the duodenum. Biopsied.  June 2023 colonoscopy - Hemorrhoids found on digital rectal exam. - The examined portion of the ileum was normal. - Diverticulosis in the descending colon and at the hepatic flexure. - Normal mucosa in the entire examined colon otherwise. Biopsied. - Non-bleeding non-thrombosed external and internal hemorrhoids.  Pathology FINAL MICROSCOPIC DIAGNOSIS:  A. STOMACH, BIOPSY:  - Patchy chronic gastritis.  No intestinal metaplasia, dysplasia or  malignancy.  Warthin-Starry stain negative for Helicobacter pylori.  B. DUODENUM, BIOPSY:  - Small bowel mucosa, no significant abnormality.  No intraepithelial  lymphocytes, villous atrophy, or malignancy.  C. COLON, RANDOM, BIOPSY:  - Colonic mucosa, no significant abnormality.  No inflammation,  dysplasia or malignancy.   Laboratory Studies  Reviewed those in epic  Imaging Studies  Previously reviewed   ASSESSMENT  Mr. Cisek is a 76 y.o. male with a pmh significant for Parkinson's disease, CAD, status post prior CVA, carotid artery disease (status post endarterectomy), hypertension, hyperlipidemia, renal artery stenosis, GERD, previous IDA (improved after iron supplementation), hemorrhoids, diverticulosis, IBS-C/D.  The patient is seen today for evaluation and management of:  1. Irritable bowel syndrome with both  constipation and diarrhea   2. Change in bowel habits   3. Lower abdominal pain    The patient is hemodynamically stable.  Clinically he is also doing well overall.  We need to be more aggressive with his bowels to try to help him have bowel movements more regularly because I think he will have less symptoms of constipation and alternating diarrhea if we can get his bowels moving more regularly.  He had trialed Linzess 72 mcg but did not find it to be effective.  We are going to have the patient double up his dose of Linzess to 145 mcg and see how he does.  If he does well then we will send in an updated prescription otherwise can consider going up to 290 mcg or trying a different medication.  The patient wants to try MiraLAX first however.  I am okay with that.  He will use MiraLAX once to twice daily on a regular basis and try that for a few weeks and then let us know within the next 6 weeks how he has done so that we can know where we go regards to his bowel habits.  He will maintain his current PPI therapy.  All patient questions were answered to the best of my ability, and the patient agrees to the aforementioned plan of action with follow-up as indicated.   PLAN  Continue PPI daily Continue fiber supplementation and high-fiber diet Retrial MiraLAX therapy - Every other day for 1 week and then go to daily and may increase as necessary up to twice daily If no effectiveness then go and trial Linzess 145 mcg (use two 72 mcg tablets with the refills that you have and let us know if that is effective or not) Other therapies may be considered if he is not found to have effectiveness with MiraLAX or uptitrating Linzess   No orders of the defined types were placed in this encounter.   New Prescriptions   No medications on file   Modified Medications   No medications on file    Planned Follow Up No follow-ups on file.   Total Time in Face-to-Face and in Coordination of Care for patient  including independent/personal interpretation/review of prior testing, medical history, examination, medication adjustment, communicating results with the patient directly, and documentation within the EHR is 25 minutes.   Justice Britain, MD [facility] Office # [phone]

## 2022-05-26 ENCOUNTER — Encounter: Payer: Self-pay | Admitting: Gastroenterology

## 2022-05-30 ENCOUNTER — Telehealth: Payer: Self-pay | Admitting: *Deleted

## 2022-05-30 ENCOUNTER — Telehealth: Payer: Self-pay

## 2022-05-30 NOTE — Telephone Encounter (Signed)
Pt agreeable to plan of care for tele pre op appt 05/31/22 @ 10:40 add on due to med hold and procedure date. I have instructed the pt to not take his ASA tonight as he needs to hold ASA x 7 days prior .    Med rec and consent are done.

## 2022-05-30 NOTE — Telephone Encounter (Signed)
   Pre-operative Risk Assessment    Patient Name: Nazim Kadlec  DOB: 20-Nov-1945 MRN: [id number]      Request for Surgical Clearance    Procedure:   retinal Surgery Vitrectomy Internal limiting membrane peel,Kenalog injection  Date of Surgery:  Clearance 06/06/22                                 Surgeon:  Dr Rutherford Nail Surgeon's Group or Practice Name:  [facility] Phone number:  [phone] Fax number:  [phone]   Type of Clearance Requested:   - Pharmacy:  Hold Aspirin 7 day prior   Type of Anesthesia:  MAC   Additional requests/questions:  Please fax a copy of clearance to the surgeon's office.  Signed, Jeanmarie Plant Roselyn Doby  CCMA 05/30/2022, 1:52 PM

## 2022-05-30 NOTE — Telephone Encounter (Signed)
   Name: Terry [name]  DOB: Jan 24, 1946  MRN: [id number]  Primary Cardiologist: Evalina Field, MD   Preoperative team, please contact this patient and set up a phone call appointment for further preoperative risk assessment. Please obtain consent and complete medication review. Thank you for your help.  I confirm that guidance regarding antiplatelet and oral anticoagulation therapy has been completed and, if necessary, noted below.  Patient's aspirin is prescribed by a noncardiac provider.  Recommendations for holding aspirin will need to come from prescribing provider.   Deberah Pelton, NP 05/30/2022, 2:24 PM [place]

## 2022-05-30 NOTE — Telephone Encounter (Signed)
Pt agreeable to plan of care for tele pre op appt 05/31/22 @ 10:40 add on due to med hold and procedure date. I have instructed the pt to not take his ASA tonight as he needs to hold ASA x 7 days prior .   Med rec and consent are done.      Patient Consent for Virtual Visit        Terry [name] has provided verbal consent on 05/30/2022 for a virtual visit (video or telephone).   CONSENT FOR VIRTUAL VISIT FOR:  Terry [name]  By participating in this virtual visit I agree to the following:  I hereby voluntarily request, consent and authorize [facility] and its employed or contracted physicians, physician assistants, nurse practitioners or other licensed health care professionals (the Practitioner), to provide me with telemedicine health care services (the "Services") as deemed necessary by the treating Practitioner. I acknowledge and consent to receive the Services by the Practitioner via telemedicine. I understand that the telemedicine visit will involve communicating with the Practitioner through live audiovisual communication technology and the disclosure of certain medical information by electronic transmission. I acknowledge that I have been given the opportunity to request an in-person assessment or other available alternative prior to the telemedicine visit and am voluntarily participating in the telemedicine visit.  I understand that I have the right to withhold or withdraw my consent to the use of telemedicine in the course of my care at any time, without affecting my right to future care or treatment, and that the Practitioner or I may terminate the telemedicine visit at any time. I understand that I have the right to inspect all information obtained and/or recorded in the course of the telemedicine visit and may receive copies of available information for a reasonable fee.  I understand that some of the potential risks of receiving the Services via telemedicine include:  Delay  or interruption in medical evaluation due to technological equipment failure or disruption; Information transmitted may not be sufficient (e.g. poor resolution of images) to allow for appropriate medical decision making by the Practitioner; and/or  In rare instances, security protocols could fail, causing a breach of personal health information.  Furthermore, I acknowledge that it is my responsibility to provide information about my medical history, conditions and care that is complete and accurate to the best of my ability. I acknowledge that Practitioner's advice, recommendations, and/or decision may be based on factors not within their control, such as incomplete or inaccurate data provided by me or distortions of diagnostic images or specimens that may result from electronic transmissions. I understand that the practice of medicine is not an exact science and that Practitioner makes no warranties or guarantees regarding treatment outcomes. I acknowledge that a copy of this consent can be made available to me via my patient portal ([facility]), or I can request a printed copy by calling the office of [facility].    I understand that my insurance will be billed for this visit.   I have read or had this consent read to me. I understand the contents of this consent, which adequately explains the benefits and risks of the Services being provided via telemedicine.  I have been provided ample opportunity to ask questions regarding this consent and the Services and have had my questions answered to my satisfaction. I give my informed consent for the services to be provided through the use of telemedicine in my medical care

## 2022-05-31 ENCOUNTER — Ambulatory Visit: Payer: Medicare Other | Attending: Cardiology | Admitting: General Practice

## 2022-05-31 DIAGNOSIS — Z01810 Encounter for preprocedural cardiovascular examination: Secondary | ICD-10-CM

## 2022-05-31 NOTE — Progress Notes (Signed)
Virtual Visit via Telephone Note   Because of Terry [name]'s co-morbid illnesses, he is at least at moderate risk for complications without adequate follow up.  This format is felt to be most appropriate for this patient at this time.  The patient did not have access to video technology/had technical difficulties with video requiring transitioning to audio format only (telephone).  All issues noted in this document were discussed and addressed.  No physical exam could be performed with this format.  Please refer to the patient's chart for his consent to telehealth for [facility].  Evaluation Performed:  Preoperative cardiovascular risk assessment _____________   Date:  05/31/2022   Patient ID:  Terry [name], DOB 02/15/1946, MRN [id number] Patient Location:  Home Provider location:   Office  Primary Care Provider:  Dois Davenport, MD Primary Cardiologist:  [name], MD  Chief Complaint / Patient Profile   76 y.o. y/o male with a h/o essential hypertension, CVA, GERD Parkinson's, HLD who is pending retinal surgery, vitrectomy internal limiting membrane peel, Kenalog injection and presents today for telephonic preoperative cardiovascular risk assessment.  Past Medical History    Past Medical History:  Diagnosis Date   Carotid artery stenosis 04/29/2018   Chronic tension headaches 04/29/2018   Essential hypertension 04/29/2018   GERD (gastroesophageal reflux disease) 04/29/2018   HLD (hyperlipidemia) 04/29/2018   Parkinson's disease 04/29/2018   Past Surgical History:  Procedure Laterality Date   BIOPSY  01/09/2022   Procedure: BIOPSY;  Surgeon: Lemar Lofty., MD;  Location: [facility];  Service: Gastroenterology;;  EGD and COLON   COLONOSCOPY WITH PROPOFOL N/A 01/09/2022   Procedure: COLONOSCOPY WITH PROPOFOL;  Surgeon: Lemar Lofty., MD;  Location: [facility];  Service: Gastroenterology;  Laterality: N/A;   ENDARTERECTOMY Left 04/27/2021    Procedure: LEFT CAROTID ENDARTERECTOMY;  Surgeon: Nada Libman, MD;  Location: [facility] OR;  Service: Vascular;  Laterality: Left;   ESOPHAGOGASTRODUODENOSCOPY (EGD) WITH PROPOFOL N/A 01/09/2022   Procedure: ESOPHAGOGASTRODUODENOSCOPY (EGD) WITH PROPOFOL;  Surgeon: Lemar Lofty., MD;  Location: [facility];  Service: Gastroenterology;  Laterality: N/A;   LEFT HEART CATH AND CORONARY ANGIOGRAPHY N/A 11/25/2019   Procedure: LEFT HEART CATH AND CORONARY ANGIOGRAPHY;  Surgeon: Lyn Records, MD;  Location: [facility];  Service: Cardiovascular;  Laterality: N/A;   PATCH ANGIOPLASTY Left 04/27/2021   Procedure: PATCH ANGIOPLASTY USING Livia Snellen BIOLOGIC PATCH;  Surgeon: Nada Libman, MD;  Location: [facility] OR;  Service: Vascular;  Laterality: Left;   TONSILLECTOMY      Allergies  Allergies  Allergen Reactions   Penicillins Rash   Erythromycin Rash   Clam Shell Nausea And Vomiting    History of Present Illness    Terry [name] is a 76 y.o. male who presents via audio/video conferencing for a telehealth visit today.  Pt was last seen in cardiology clinic on 03/22/2022 by Dr. Lalla Brothers.  At that time Terry [name] was doing well .  The patient is now pending procedure as outlined above. Since his last visit, he remains stable from cardiac standpoint.  Today he denies chest pain, shortness of breath, lower extremity edema, fatigue, palpitations, melena, hematuria, hemoptysis, diaphoresis, weakness, presyncope, syncope, orthopnea, and PND.  Home Medications    Prior to Admission medications   Medication Sig Start Date End Date Taking? Authorizing Provider  acetaminophen (TYLENOL) 500 MG tablet Take 500 mg by mouth every 6 (six) hours as needed for moderate pain or headache.    [provider]  amLODipine (NORVASC) 2.5 MG tablet Take 2.5 mg by mouth See admin instructions. Take blood pressure twice a day and send to the doctor via e-mail ,and she responds if need to take or not  07/06/21   [provider]  aspirin EC 81 MG EC tablet Take 1 tablet (81 mg total) by mouth daily. Swallow whole. 04/29/21   Hughie Closs, MD  atorvastatin (LIPITOR) 20 MG tablet TAKE ONE TABLET BY MOUTH EVERY NIGHT AT BEDTIME 05/19/20   Bloomfield, Carley D, DO  atropine 1 % ophthalmic solution Place 1 drop under the tongue See admin instructions. As needed    [provider]  brimonidine-timolol (COMBIGAN) 0.2-0.5 % ophthalmic solution Apply 1 drop to eye 2 (two) times daily. 05/16/22   [provider]  carbidopa-levodopa (SINEMET CR) 50-200 MG tablet Take 1 tablet by mouth at bedtime. 07/29/18   Lanelle Bal, MD  carbidopa-levodopa (SINEMET IR) 25-100 MG tablet Take 2.5 tablets by mouth 4 (four) times daily. 7am, 12pm, 5pm, 10pm    [provider]  cetirizine (ZYRTEC) 10 MG tablet Take 10 mg by mouth as needed for allergies.     [provider]  Cholecalciferol (VITAMIN D3) 1.25 MG (50000 UT) CAPS Take 50,000 Units by mouth once a week.    [provider]  Cyanocobalamin (VITAMIN B-12 IJ) Inject 1,000 mcg as directed every 30 (thirty) days.    [provider]  fluticasone (FLONASE) 50 MCG/ACT nasal spray Place 1 spray into both nostrils daily as needed for allergies.     [provider]  LINZESS 72 MCG capsule TAKE 1 CAPSULE BY MOUTH DAILY BEFORE BREAKFAST. 04/07/22   Mansouraty, Netty Starring., MD  meloxicam (MOBIC) 15 MG tablet Take 15 mg by mouth at bedtime as needed for pain. 11/03/19   [provider]  NEUPRO 4 MG/24HR Place 1 patch onto the skin every morning. 04/19/21   [provider]  olopatadine (PATADAY) 0.1 % ophthalmic solution Place 1 drop into both eyes daily as needed for allergies.    [provider]  pantoprazole (PROTONIX) 40 MG tablet Take 1 tablet (40 mg total) by mouth daily. 09/07/21   Mansouraty, Netty Starring., MD  tiZANidine (ZANAFLEX) 2 MG tablet Take 2 mg by mouth daily as  needed for muscle spasms. 04/18/21   [provider]  traZODone (DESYREL) 50 MG tablet Take 1 tablet (50 mg total) by mouth at bedtime as needed for sleep. Patient taking differently: Take 50 mg by mouth at bedtime. 09/03/18   Lanelle Bal, MD  valsartan (DIOVAN) 40 MG tablet Take 20 mg by mouth as needed (Take blood pressure twice a day and send to the doctor via e-mail ,and she responds if need to take or not).    [provider]    Physical Exam    Vital Signs:  Jamiah Recore does not have vital signs available for review today.  Given telephonic nature of communication, physical exam is limited. AAOx3. NAD. Normal affect.  Speech and respirations are unlabored.  Accessory Clinical Findings    None  Assessment & Plan    1.  Preoperative Cardiovascular Risk Assessment:retinal surgery, vitrectomy internal limiting membrane peel, Kenalog injection, [facility] retina Dr. Essie Hart   Primary Cardiologist: [name], MD  Chart reviewed as part of pre-operative protocol coverage. Given past medical history and time since last visit, based on ACC/AHA guidelines, Wolfgang Finigan would be at acceptable risk for the planned procedure without further cardiovascular testing.  Patient was advised that if he develops new symptoms prior to surgery to contact our office to arrange a follow-up appointment.  He verbalized understanding.  I will route this recommendation to the requesting party via Epic fax function and remove from pre-op pool.     Time:   Today, I have spent 5 minutes with the patient with telehealth technology discussing medical history, symptoms, and management plan.  Prior to his phone evaluation I spent greater than 10 minutes reviewing his past medical history and cardiac medications.   Ronney Asters, NP  05/31/2022, 7:46 AM

## 2022-05-31 NOTE — Progress Notes (Signed)
Notes faxed to requesting office  

## 2022-06-19 ENCOUNTER — Ambulatory Visit (INDEPENDENT_AMBULATORY_CARE_PROVIDER_SITE_OTHER): Payer: Medicare Other

## 2022-06-19 ENCOUNTER — Telehealth: Payer: Self-pay

## 2022-06-19 DIAGNOSIS — R55 Syncope and collapse: Secondary | ICD-10-CM | POA: Diagnosis not present

## 2022-06-19 LAB — CUP PACEART REMOTE DEVICE CHECK
Date Time Interrogation Session: 20231127010200
Implantable Pulse Generator Implant Date: 20230523
Pulse Gen Serial Number: 175044

## 2022-06-19 NOTE — Telephone Encounter (Signed)
ILR summary report received. Battery status OK. Normal device function. No new tachy, brady, episodes. No new AF episodes. Monthly summary reports and ROV/PRN 3 symptom activation's, 9/9 events with 8 beats of NSVT prior to activaition, annotation fluttering, light headed, others NSR 19 pause events, some during waking hours, longest 5.9sec  historically asymptomatic- route to triage  Attempted outreach to Pt.  No answer.  Left message requesting call back.
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
## 2022-06-25 ENCOUNTER — Emergency Department (HOSPITAL_COMMUNITY)
Admission: EM | Admit: 2022-06-25 | Discharge: 2022-06-26 | Disposition: A | Discharge status: Home / Self Care | Payer: Medicare Other | Attending: Emergency Medicine | Admitting: Emergency Medicine

## 2022-06-25 ENCOUNTER — Emergency Department (HOSPITAL_COMMUNITY): Payer: Medicare Other

## 2022-06-25 ENCOUNTER — Other Ambulatory Visit: Payer: Self-pay

## 2022-06-25 DIAGNOSIS — Y92410 Unspecified street and highway as the place of occurrence of the external cause: Secondary | ICD-10-CM | POA: Diagnosis not present

## 2022-06-25 DIAGNOSIS — Z79899 Other long term (current) drug therapy: Secondary | ICD-10-CM | POA: Insufficient documentation

## 2022-06-25 DIAGNOSIS — G20C Parkinsonism, unspecified: Secondary | ICD-10-CM | POA: Insufficient documentation

## 2022-06-25 DIAGNOSIS — Z7982 Long term (current) use of aspirin: Secondary | ICD-10-CM | POA: Insufficient documentation

## 2022-06-25 DIAGNOSIS — S161XXA Strain of muscle, fascia and tendon at neck level, initial encounter: Secondary | ICD-10-CM | POA: Diagnosis not present

## 2022-06-25 DIAGNOSIS — R519 Headache, unspecified: Secondary | ICD-10-CM | POA: Diagnosis not present

## 2022-06-25 DIAGNOSIS — I10 Essential (primary) hypertension: Secondary | ICD-10-CM | POA: Insufficient documentation

## 2022-06-25 DIAGNOSIS — M542 Cervicalgia: Secondary | ICD-10-CM | POA: Diagnosis present

## 2022-06-25 LAB — CBC WITH DIFFERENTIAL/PLATELET
Abs Immature Granulocytes: 0.03 10*3/uL (ref 0.00–0.07)
Basophils Absolute: 0 10*3/uL (ref 0.0–0.1)
Basophils Relative: 0 %
Eosinophils Absolute: 0.1 10*3/uL (ref 0.0–0.5)
Eosinophils Relative: 1 %
HCT: 41.3 % (ref 39.0–52.0)
Hemoglobin: 13.9 g/dL (ref 13.0–17.0)
Immature Granulocytes: 0 %
Lymphocytes Relative: 25 %
Lymphs Abs: 2.7 10*3/uL (ref 0.7–4.0)
MCH: 29.4 pg (ref 26.0–34.0)
MCHC: 33.7 g/dL (ref 30.0–36.0)
MCV: 87.3 fL (ref 80.0–100.0)
Monocytes Absolute: 0.8 10*3/uL (ref 0.1–1.0)
Monocytes Relative: 8 %
Neutro Abs: 6.9 10*3/uL (ref 1.7–7.7)
Neutrophils Relative %: 66 %
Platelets: 199 10*3/uL (ref 150–400)
RBC: 4.73 MIL/uL (ref 4.22–5.81)
RDW: 13.7 % (ref 11.5–15.5)
WBC: 10.5 10*3/uL (ref 4.0–10.5)
nRBC: 0 % (ref 0.0–0.2)

## 2022-06-25 LAB — COMPREHENSIVE METABOLIC PANEL
ALT: 7 U/L (ref 0–44)
AST: 15 U/L (ref 15–41)
Albumin: 4.2 g/dL (ref 3.5–5.0)
Alkaline Phosphatase: 54 U/L (ref 38–126)
Anion gap: 7 (ref 5–15)
BUN: 17 mg/dL (ref 8–23)
CO2: 26 mmol/L (ref 22–32)
Calcium: 9 mg/dL (ref 8.9–10.3)
Chloride: 106 mmol/L (ref 98–111)
Creatinine, Ser: 1.01 mg/dL (ref 0.61–1.24)
GFR, Estimated: >60 mL/min (ref >60)
Glucose, Bld: 116 mg/dL — ABNORMAL HIGH (ref 70–99)
Potassium: 4.2 mmol/L (ref 3.5–5.1)
Sodium: 139 mmol/L (ref 135–145)
Total Bilirubin: 0.8 mg/dL (ref 0.3–1.2)
Total Protein: 7 g/dL (ref 6.5–8.1)

## 2022-06-25 LAB — TROPONIN I (HIGH SENSITIVITY): Troponin I (High Sensitivity): 6 ng/L (ref <18)

## 2022-06-25 MED ORDER — ACETAMINOPHEN 500 MG PO TABS
1000.0000 mg | ORAL_TABLET | Freq: Once | ORAL | Status: AC
  Administered 2022-06-25: 1000 mg via ORAL
  Filled 2022-06-25: qty 2

## 2022-06-25 MED ORDER — CYCLOBENZAPRINE HCL 10 MG PO TABS
5.0000 mg | ORAL_TABLET | Freq: Once | ORAL | Status: AC
  Administered 2022-06-25: 5 mg via ORAL
  Filled 2022-06-25: qty 1

## 2022-06-25 MED ORDER — CYCLOBENZAPRINE HCL 5 MG PO TABS: 5.0000 mg | ORAL_TABLET | Freq: Two times a day (BID) | ORAL | 0 refills | Status: DC | PRN

## 2022-06-25 MED ORDER — AMLODIPINE BESYLATE 5 MG PO TABS
5.0000 mg | ORAL_TABLET | Freq: Once | ORAL | Status: AC
  Administered 2022-06-25: 2.5 mg via ORAL
  Filled 2022-06-25: qty 1
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
## 2022-06-26 NOTE — Telephone Encounter (Signed)
Attempted to call patient to advise to follow up with PCP. No answer, LMTCB.

## 2022-06-27 NOTE — Telephone Encounter (Signed)
Left detailed message per DPR advising Dr. Lalla Brothers would like Pt to follow up with PCP regarding HTN.  Pt currently living in facility.

## 2022-07-03 ENCOUNTER — Telehealth: Payer: Self-pay

## 2022-07-03 NOTE — Telephone Encounter (Signed)
Following alert received from [employer] received for ILR alert for pause 12/9 @ 12:26, 5.1sec in duration. No symptom activation.  Patient called and report asymptomatic. Compliant with meds on file. Advised if he has symptoms to please let us know/press symptom activator. Patient voiced understanding.  Routing to Dr. Lalla Brothers considering hours of pause.

## 2022-07-20 ENCOUNTER — Ambulatory Visit (INDEPENDENT_AMBULATORY_CARE_PROVIDER_SITE_OTHER): Payer: Medicare Other

## 2022-07-20 DIAGNOSIS — R55 Syncope and collapse: Secondary | ICD-10-CM

## 2022-07-31 NOTE — Progress Notes (Signed)
[place] [facility]  

## 2022-08-01 LAB — CUP PACEART REMOTE DEVICE CHECK
Date Time Interrogation Session: 20231228010800
Implantable Pulse Generator Implant Date: 20230523
Pulse Gen Serial Number: 175044

## 2022-08-08 NOTE — Progress Notes (Signed)
Carelink Summary Report / Loop Recorder

## 2022-08-21 ENCOUNTER — Ambulatory Visit: Payer: Medicare Other

## 2022-08-21 DIAGNOSIS — R55 Syncope and collapse: Secondary | ICD-10-CM

## 2022-08-21 LAB — CUP PACEART REMOTE DEVICE CHECK
Date Time Interrogation Session: 20240127011300
Implantable Pulse Generator Implant Date: 20230523
Pulse Gen Serial Number: 175044

## 2022-08-31 ENCOUNTER — Telehealth: Payer: Self-pay

## 2022-08-31 NOTE — Telephone Encounter (Signed)
Alert received for 4 second pause.  Outreach  made to Pt.  He has been asymptomatic, but has a high burden of pauses.  Follow up appt made with RU to review.

## 2022-09-18 ENCOUNTER — Telehealth: Payer: Self-pay

## 2022-09-18 NOTE — Telephone Encounter (Signed)
Alert received from [employer]:  ILR alert for 3 new pause events, likely during waking hours Duration 3.7-5.1sec per device - route to triage for >5sec pause per protocol Not a new finding, f/u has been scheduled for pause burden

## 2022-09-20 ENCOUNTER — Ambulatory Visit: Payer: Medicare Other | Attending: Physician Assistant | Admitting: Physician Assistant

## 2022-09-20 ENCOUNTER — Encounter: Payer: Self-pay | Admitting: Physician Assistant

## 2022-09-20 VITALS — BP 164/78 | HR 67 | Ht 72.0 in | Wt 169.6 lb

## 2022-09-20 DIAGNOSIS — Z4509 Encounter for adjustment and management of other cardiac device: Secondary | ICD-10-CM

## 2022-09-20 DIAGNOSIS — I10 Essential (primary) hypertension: Secondary | ICD-10-CM

## 2022-09-20 DIAGNOSIS — R55 Syncope and collapse: Secondary | ICD-10-CM | POA: Diagnosis not present

## 2022-09-20 NOTE — Patient Instructions (Signed)
Medication Instructions:   Your physician recommends that you continue on your current medications as directed. Please refer to the Current Medication list given to you today.  *If you need a refill on your cardiac medications before your next appointment, please call your pharmacy*   Lab Work: [place]   If you have labs (blood work) drawn today and your tests are completely normal, you will receive your results only by: [place] (if you have MyChart) OR A paper copy in the mail If you have any lab test that is abnormal or we need to change your treatment, we will call you to review the results.   Testing/Procedures: NONE ORDERED  TODAY    Follow-Up: At [facility], you and your health needs are our priority.  As part of our continuing mission to provide you with exceptional heart care, we have created designated Provider Care Teams.  These Care Teams include your primary Cardiologist (physician) and Advanced Practice Providers (APPs -  Physician Assistants and Nurse Practitioners) who all work together to provide you with the care you need, when you need it.  We recommend signing up for the patient portal called "MyChart".  Sign up information is provided on this After Visit Summary.  MyChart is used to connect with patients for Virtual Visits (Telemedicine).  Patients are able to view lab/test results, encounter notes, upcoming appointments, etc.  Non-urgent messages can be sent to your provider as well.   To learn more about what you can do with MyChart, go to [email or web address].    Your next appointment:   3 month(s)  Provider:   You may see Vickie Epley, MD or one of the following Advanced Practice Providers on your designated Care Team:   Tommye Standard, [place] Legrand Como "Jonni Sanger" Chalmers Cater, [place]  Other Instructions

## 2022-09-20 NOTE — Progress Notes (Addendum)
Cardiology Office Note Date:  09/20/2022  Patient ID:  Terry [name] [birth year], MRN [id number] PCP:  Hayden Rasmussen, MD  Cardiologist:  Dr. Marisue Ivan Electrophysiologist: Dr. Quentin Ore    Chief Complaint:   pauses on loop  History of Present Illness: Terry [name] is a 77 y.o. 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 Trula Slade), stroke, no obstructive CAD, HLD, Parkinsons disease,   He saw Dr. Marisue Ivan Feb 2023, dizziness was mostly positional and por orthostatic sounding, he had markedly abnormal orthostatic vitals.  Ongoing weight loss being evaluated for mesenteric vascular disease He had a monitor with sinus pause that appeared to be vagal with P-P prolongation ahead, though with reports of syncope. Felt further evaluation was indicated and referred to EP. Though his symptoms more consistent w/o orthostatic and volume depletion. Advised better hydration and conpression  Subsequent loop implant  I saw him 02/01/22 to evaluate pauses on his loop He comes accompanied by his wife. He has not had syncope since his loop implant His neurologist recommended that he wear support stockings, his wife mentions that this did infact seem to help his symptoms, but he does not tend to actually wear them He does have orthostatic dizziness He does NOT have episodic symptoms, no moments of dizziness/weakness He has days of feeling poorly all day long, or some days all morning  He had a number of sinus pauses Longest 5.4 seconds may 26th He had a 3s pause 01/31/22 02: 29AM, slight sinus slowing leading to it, and ? If it comes out in a 2:1 AVblock, though V rates are very good in the 60's, perhaps a U wave. Other sinus pauses do happen in the day time, not with any suggestion of heart block None of them seem to correlate clearly with symptoms Pauses suspect to be incidental and not clearly the cause of any symptoms, advised hydration,  compression wear, symptom log with his device  He saw Dr. Quentin Ore 03/22/22, BP up/down, no recurrent syncope since improved hydration, overall significantly slowed over the years, actively working on weight loss.  Pending move to retirement home Did not think there is a clear correlation between his symptoms in the past and AV conduction abnormalities. We will continue loop recorder monitoring for any evidence of sustained AV conduction abnormalities.   Device alert note 09/18/22 with some pauses  TODAY He is accompanied by hs wife. No near syncope or syncope, no spells of dizziness. He has days that he feels a bit lightheaded, more weak, though this is an all day symptom, not moments in times. No syncope. No cardiac awareness of any kind  They live at [facility] now and he as started walking more there in the fitness area, and his wife reports doing pretty well, but they both agree, the Parkinson's meds dont seem to help as much as they/neurology team had hoped.  Device information BSci loop implanted 12/13/21   Past Medical History:  Diagnosis Date   Carotid artery stenosis 04/29/2018   Chronic tension headaches 04/29/2018   Essential hypertension 04/29/2018   GERD (gastroesophageal reflux disease) 04/29/2018   HLD (hyperlipidemia) 04/29/2018   Parkinson's disease 04/29/2018    Past Surgical History:  Procedure Laterality Date   BIOPSY  01/09/2022   Procedure: BIOPSY;  Surgeon: [name] Copas., MD;  Location: [facility];  Service: Gastroenterology;;  EGD and COLON   COLONOSCOPY WITH PROPOFOL N/A 01/09/2022   Procedure: COLONOSCOPY WITH  PROPOFOL;  Surgeon: [name] Copas., MD;  Location: [facility];  Service: Gastroenterology;  Laterality: N/A;   ENDARTERECTOMY Left 04/27/2021   Procedure: LEFT CAROTID ENDARTERECTOMY;  Surgeon: Serafina Mitchell, MD;  Location: [facility];  Service: Vascular;  Laterality: Left;   ESOPHAGOGASTRODUODENOSCOPY (EGD) WITH PROPOFOL N/A 01/09/2022    Procedure: ESOPHAGOGASTRODUODENOSCOPY (EGD) WITH PROPOFOL;  Surgeon: [name] Copas., MD;  Location: [facility];  Service: Gastroenterology;  Laterality: N/A;   LEFT HEART CATH AND CORONARY ANGIOGRAPHY N/A 11/25/2019   Procedure: LEFT HEART CATH AND CORONARY ANGIOGRAPHY;  Surgeon: Belva Crome, MD;  Location: [facility];  Service: Cardiovascular;  Laterality: N/A;   PATCH ANGIOPLASTY Left 04/27/2021   Procedure: PATCH ANGIOPLASTY USING Rueben Bash BIOLOGIC PATCH;  Surgeon: Serafina Mitchell, MD;  Location: [facility];  Service: Vascular;  Laterality: Left;   TONSILLECTOMY      Current Outpatient Medications  Medication Sig Dispense Refill   acetaminophen (TYLENOL) 500 MG tablet Take 500 mg by mouth every 6 (six) hours as needed for moderate pain or headache.     amLODipine (NORVASC) 2.5 MG tablet Take 2.5 mg by mouth See admin instructions. Take blood pressure twice a day and send to the doctor via e-mail ,and she responds if need to take or not     aspirin EC 81 MG EC tablet Take 1 tablet (81 mg total) by mouth daily. Swallow whole. 30 tablet 1   atorvastatin (LIPITOR) 20 MG tablet TAKE ONE TABLET BY MOUTH EVERY NIGHT AT BEDTIME 90 tablet 0   atropine 1 % ophthalmic solution Place 1 drop under the tongue See admin instructions. As needed     brimonidine-timolol (COMBIGAN) 0.2-0.5 % ophthalmic solution Apply 1 drop to eye 2 (two) times daily.     carbidopa-levodopa (SINEMET CR) 50-200 MG tablet Take 1 tablet by mouth at bedtime. 90 tablet 1   carbidopa-levodopa (SINEMET IR) 25-100 MG tablet Take 2.5 tablets by mouth 4 (four) times daily. 7am, 12pm, 5pm, 10pm     cetirizine (ZYRTEC) 10 MG tablet Take 10 mg by mouth as needed for allergies.      Cholecalciferol (VITAMIN D3) 1.25 MG (50000 UT) CAPS Take 50,000 Units by mouth once a week.     Cyanocobalamin (VITAMIN B-12 IJ) Inject 1,000 mcg as directed every 30 (thirty) days.     cyclobenzaprine (FLEXERIL) 5 MG tablet Take 1-2 tablets (5-10  mg total) by mouth 2 (two) times daily as needed for muscle spasms. 20 tablet 0   fluticasone (FLONASE) 50 MCG/ACT nasal spray Place 1 spray into both nostrils daily as needed for allergies.      LINZESS 72 MCG capsule TAKE 1 CAPSULE BY MOUTH DAILY BEFORE BREAKFAST. 30 capsule 2   meloxicam (MOBIC) 15 MG tablet Take 15 mg by mouth at bedtime as needed for pain.     NEUPRO 4 MG/24HR Place 1 patch onto the skin every morning.     olopatadine (PATADAY) 0.1 % ophthalmic solution Place 1 drop into both eyes daily as needed for allergies.     pantoprazole (PROTONIX) 40 MG tablet Take 1 tablet (40 mg total) by mouth daily. 90 tablet 4   traZODone (DESYREL) 50 MG tablet Take 1 tablet (50 mg total) by mouth at bedtime as needed for sleep. (Patient taking differently: Take 50 mg by mouth at bedtime.) 90 tablet 0   valsartan (DIOVAN) 40 MG tablet Take 20 mg by mouth as needed (Take blood pressure twice a day and send to  the doctor via e-mail ,and she responds if need to take or not).     No current facility-administered medications for this visit.    Allergies:   Penicillins, Erythromycin, and Clam shell   Social History:  The patient  reports that he has never smoked. He has never been exposed to tobacco smoke. He has quit using smokeless tobacco.  His smokeless tobacco use included snuff. He reports current alcohol use. He reports that he does not use drugs.   Family History:  The patient's family history includes Cancer in his father; Heart disease in his mother; Parkinson's disease in his sister.  ROS:  Please see the history of present illness.    All other systems are reviewed and otherwise negative.   PHYSICAL EXAM:  VS:  There were no vitals taken for this visit. BMI: There is no height or weight on file to calculate BMI. Well nourished, well developed, in no acute distress HEENT: normocephalic, atraumatic Neck: no JVD, carotid bruits or masses Cardiac:  RRR; no significant murmurs, no rubs,  or gallops Lungs:  CTA b/l, no wheezing, rhonchi or rales Abd: soft, nontender MS: no deformity, perhaps advanced atrophy Ext: no edema Skin: warm and dry, no rash Neuro:  No gross deficits appreciated Psych: euthymic mood, full affect  ILR site is stable, no tethering or discomfort   EKG:  not done today  Device interrogation done today and reviewed by myself:  Battery is OK  R waves 0.57 5 pause episodes All have sinus slowing associated with pauses, these are associated with brief AVblock  Longed 4.6 seconds Occur during day time  Cardiac Telemetry 08/22/2021 Impression: 1. Episode of sinus pause 08/07/2021 @ 2:32 pm which possibly represent paroxysmal AV block (vagal mediated) due to lengthening of the P-P interval with dropped QRS complexes. Total duration 4.4 seconds.  2. Second degree AV block, Mobitz 1 present.  3. Brief SVT was present, likely atrial tachycardia (6 episodes, longest 9.5 seconds).    TTE 04/24/2021  1. Left ventricular ejection fraction, by estimation, is 65 to 70%. The  left ventricle has normal function. The left ventricle has no regional  wall motion abnormalities. There is moderate left ventricular hypertrophy.  Left ventricular diastolic  parameters are indeterminate.   2. Right ventricular systolic function is normal. The right ventricular  size is normal.   3. Left atrial size was mildly dilated.   4. Right atrial size was mildly dilated.   5. The mitral valve is grossly normal. No evidence of mitral valve  regurgitation.   6. The aortic valve is tricuspid. Aortic valve regurgitation is not  visualized.    LHC 11/25/2019 Normal left ventricular function with EF 65%.  Normal LVEDP. Normal left main. Luminal irregularities in the proximal to mid LAD less than 20%. Large ramus intermedius with luminal irregularities.  Widely patent. Dominant right coronary without evidence of obstruction or atherosclerosis.  Recent Labs: 06/25/2022: ALT 7; BUN  17; Creatinine, Ser 1.01; Hemoglobin 13.9; Platelets 199; Potassium 4.2; Sodium 139  No results found for requested labs within last 365 days.   CrCl cannot be calculated (Patient's most recent lab result is older than the maximum 21 days allowed.).   Wt Readings from Last 3 Encounters:  06/25/22 170 lb (77.1 kg)  05/23/22 165 lb 4 oz (75 kg)  03/22/22 169 lb 9.6 oz (76.9 kg)     Other studies reviewed: Additional studies/records reviewed today include: summarized above  ASSESSMENT AND PLAN:  Dizziness Syncope  Orthostatic hypotension        Discussed hydration, compression garments, safety Pauses       Suspect vagal with sinus slowing  He does not describe moment of dizziness, no near syncope or syncope. No falls or faints since his last visit Avoid nodal blocking agents Discussed symptoms to be alerted to make [place] aware of  5. HTN Recheck is 160/80 Would not aggressively lower his BP No changes    Disposition: f/u in a few months, sooner if needed    Current medicines are reviewed at length with the patient today.  The patient did not have any concerns regarding medicines.  Venetia Night, PA-C 09/20/2022 7:28 AM     [address] [place] [address] [phone] (office)  [phone] (fax)

## 2022-09-21 ENCOUNTER — Ambulatory Visit: Payer: Medicare Other

## 2022-09-21 DIAGNOSIS — R55 Syncope and collapse: Secondary | ICD-10-CM

## 2022-09-21 LAB — CUP PACEART REMOTE DEVICE CHECK
Date Time Interrogation Session: 20240229024300
Implantable Pulse Generator Implant Date: 20230523
Pulse Gen Serial Number: 175044

## 2022-09-22 ENCOUNTER — Other Ambulatory Visit: Payer: Medicare Other

## 2022-09-22 ENCOUNTER — Other Ambulatory Visit: Payer: Self-pay | Admitting: Family Medicine

## 2022-09-22 ENCOUNTER — Encounter: Payer: Self-pay | Admitting: Family Medicine

## 2022-09-22 ENCOUNTER — Ambulatory Visit
Admission: RE | Admit: 2022-09-22 | Discharge: 2022-09-22 | Disposition: A | Discharge status: Home / Self Care | Payer: Medicare Other | Source: Ambulatory Visit | Attending: Family Medicine | Admitting: Family Medicine

## 2022-09-22 DIAGNOSIS — M542 Cervicalgia: Secondary | ICD-10-CM

## 2022-09-26 ENCOUNTER — Other Ambulatory Visit: Payer: Self-pay | Admitting: Gastroenterology

## 2022-10-05 NOTE — Progress Notes (Signed)
BSX LOOP RECORDER

## 2022-10-16 ENCOUNTER — Encounter: Payer: Self-pay | Admitting: Cardiovascular Disease

## 2022-10-20 NOTE — Progress Notes (Signed)
Carelink Summary Report / Loop Recorder 

## 2022-10-23 ENCOUNTER — Ambulatory Visit (INDEPENDENT_AMBULATORY_CARE_PROVIDER_SITE_OTHER): Payer: Medicare Other

## 2022-10-23 DIAGNOSIS — R55 Syncope and collapse: Secondary | ICD-10-CM

## 2022-10-23 LAB — CUP PACEART REMOTE DEVICE CHECK
Date Time Interrogation Session: 20240331014800
Implantable Pulse Generator Implant Date: 20230523
Pulse Gen Serial Number: 175044

## 2022-11-02 ENCOUNTER — Ambulatory Visit: Payer: Medicare Other | Admitting: Cardiovascular Disease

## 2022-11-20 NOTE — Progress Notes (Unsigned)
Cardiology Office Note:   Date:  11/21/2022  NAME:  Terry [name]    MRN: [id number] DOB:  1945-08-05   PCP:  Dois Davenport, MD  Cardiologist:  [name], MD  Electrophysiologist:  Lanier Prude, MD   Referring MD: Dois Davenport, MD   Chief Complaint  Patient presents with   Follow-up        History of Present Illness:   Terry [name] is a 77 y.o. male with a hx of Parkinson's, orthostatic intolerance, sinus pauses, PAD who presents for follow-up.  He reports he is doing well.  He is only had 1 syncopal episode in the past year.  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  Parkinson's disease is stable.  Lipids were at goal 2 years ago.  Blood pressure slightly elevated but we are allowing for lenient blood pressure given his neurogenic orthostatic hypotension.  He is staying well-hydrated.  Overall doing well.  No symptoms of angina.  Takes Lipitor 20 mg daily.  On aspirin.  He does a lot of walking in his independent living facility.  Walks up to 1/2 mile per day without issues.  He did have a left carotid endarterectomy in 2022.  Follows with Dr. Myra Gianotti.  Also with mesenteric artery stenosis that is being followed as well.  Reports no abdominal pain.  Overall doing well.  Problem List 1. Non-obstructive CAD -LHC 11/25/2019 with 20% mid LAD 2. Parkinson's  3. 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 PAD (abdominal) -SMA >70% -IMA >70% 6. HLD -Total cholesterol 78, HDL 33, LDL 38, triglycerides 34 7. Sinus pauses  -vagally mediated 8. Neurogenic orthostatic hypotension  Past Medical History: Past Medical History:  Diagnosis Date   Carotid artery stenosis 04/29/2018   Chronic tension headaches 04/29/2018   Essential hypertension 04/29/2018   GERD (gastroesophageal reflux disease) 04/29/2018   HLD (hyperlipidemia) 04/29/2018   Parkinson's disease 04/29/2018    Past  Surgical History: Past Surgical History:  Procedure Laterality Date   BIOPSY  01/09/2022   Procedure: BIOPSY;  Surgeon: Lemar Lofty., MD;  Location: [facility];  Service: Gastroenterology;;  EGD and COLON   COLONOSCOPY WITH PROPOFOL N/A 01/09/2022   Procedure: COLONOSCOPY WITH PROPOFOL;  Surgeon: Lemar Lofty., MD;  Location: [facility];  Service: Gastroenterology;  Laterality: N/A;   ENDARTERECTOMY Left 04/27/2021   Procedure: LEFT CAROTID ENDARTERECTOMY;  Surgeon: Nada Libman, MD;  Location: [facility];  Service: Vascular;  Laterality: Left;   ESOPHAGOGASTRODUODENOSCOPY (EGD) WITH PROPOFOL N/A 01/09/2022   Procedure: ESOPHAGOGASTRODUODENOSCOPY (EGD) WITH PROPOFOL;  Surgeon: Lemar Lofty., MD;  Location: [facility];  Service: Gastroenterology;  Laterality: N/A;   LEFT HEART CATH AND CORONARY ANGIOGRAPHY N/A 11/25/2019   Procedure: LEFT HEART CATH AND CORONARY ANGIOGRAPHY;  Surgeon: Lyn Records, MD;  Location: [facility];  Service: Cardiovascular;  Laterality: N/A;   PATCH ANGIOPLASTY Left 04/27/2021   Procedure: PATCH ANGIOPLASTY USING Livia Snellen BIOLOGIC PATCH;  Surgeon: Nada Libman, MD;  Location: [facility];  Service: Vascular;  Laterality: Left;   TONSILLECTOMY      Current Medications: Current Meds  Medication Sig   amLODipine (NORVASC) 2.5 MG tablet Take 2.5 mg by mouth See admin instructions. Take blood pressure twice a day and send to the doctor via e-mail ,and she responds if need to take or not   aspirin EC 81 MG EC tablet Take 1 tablet (81 mg  total) by mouth daily. Swallow whole.   atorvastatin (LIPITOR) 20 MG tablet TAKE ONE TABLET BY MOUTH EVERY NIGHT AT BEDTIME   Baclofen 5 MG TABS Take 1 tablet by mouth 3 (three) times daily as needed.   brimonidine-timolol (COMBIGAN) 0.2-0.5 % ophthalmic solution Apply 1 drop to eye 2 (two) times daily.   carbidopa-levodopa (SINEMET CR) 50-200 MG tablet Take 1 tablet by mouth at bedtime.    carbidopa-levodopa (SINEMET IR) 25-100 MG tablet Take 2.5 tablets by mouth 3 (three) times daily. Patient also takes 2.5 tablets once daily   Cholecalciferol (VITAMIN D3) 1.25 MG (50000 UT) CAPS Take 50,000 Units by mouth once a week.   Cyanocobalamin (VITAMIN B-12 IJ) Inject 1,000 mcg as directed every 30 (thirty) days.   cyclobenzaprine (FLEXERIL) 5 MG tablet Take 1-2 tablets (5-10 mg total) by mouth 2 (two) times daily as needed for muscle spasms.   fluticasone (FLONASE) 50 MCG/ACT nasal spray Place 1 spray into both nostrils daily as needed for allergies.    LINZESS 72 MCG capsule TAKE 1 CAPSULE BY MOUTH DAILY BEFORE BREAKFAST.   meloxicam (MOBIC) 7.5 MG tablet Take 7.5 mg by mouth 2 (two) times daily as needed.   NEUPRO 4 MG/24HR Place 1 patch onto the skin every morning.   NUPLAZID 34 MG CAPS Take 1 capsule by mouth daily.   olopatadine (PATADAY) 0.1 % ophthalmic solution Place 1 drop into both eyes daily as needed for allergies.   pantoprazole (PROTONIX) 40 MG tablet TAKE 1 TABLET BY MOUTH EVERY DAY   Prenatal Vit-Fe Fumarate-FA (M-NATAL PLUS) 27-1 MG TABS Take 1 tablet by mouth daily.   traZODone (DESYREL) 50 MG tablet Take 1 tablet (50 mg total) by mouth at bedtime as needed for sleep. (Patient taking differently: Take 50 mg by mouth at bedtime.)   valsartan (DIOVAN) 40 MG tablet Take 40 mg by mouth 2 (two) times daily.     Allergies:    Penicillins, Erythromycin, and Clam shell   Social History: Social History   Socioeconomic History   Marital status: Married    Spouse name: Not on file   Number of children: Not on file   Years of education: Not on file   Highest education level: Not on file  Occupational History   Not on file  Tobacco Use   Smoking status: Never    Passive exposure: Never   Smokeless tobacco: Former    Types: Snuff  Vaping Use   Vaping Use: Never used  Substance and Sexual Activity   Alcohol use: Yes    Comment: Occasionally.   Drug use: Never    Sexual activity: Not Currently  Other Topics Concern   Not on file  Social History Narrative   Not on file   Social Determinants of Health   Financial Resource Strain: Not on file  Food Insecurity: Not on file  Transportation Needs: Not on file  Physical Activity: Not on file  Stress: Not on file  Social Connections: Not on file     Family History: The patient's family history includes Cancer in his father; Heart disease in his mother; Parkinson's disease in his sister. There is no history of Colon cancer, Stomach cancer, Esophageal cancer, Inflammatory bowel disease, Liver disease, Pancreatic cancer, or Rectal cancer.  ROS:   All other ROS reviewed and negative. Pertinent positives noted in the HPI.     EKGs/Labs/Other Studies Reviewed:   The following studies were personally reviewed by me today:  Recent Labs: 06/25/2022: ALT  7; BUN 17; Creatinine, Ser 1.01; Hemoglobin 13.9; Platelets 199; Potassium 4.2; Sodium 139   Recent Lipid Panel    Component Value Date/Time   CHOL 78 04/28/2021 0445   CHOL 99 (L) 12/10/2019 1112   TRIG 52 04/28/2021 0445   HDL 37 (L) 04/28/2021 0445   HDL 47 12/10/2019 1112   CHOLHDL 2.1 04/28/2021 0445   VLDL 10 04/28/2021 0445   LDLCALC 31 04/28/2021 0445   LDLCALC 39 12/10/2019 1112    Physical Exam:   VS:  BP (!) 150/72 (BP Location: Left Arm, Patient Position: Sitting, Cuff Size: Normal)   Pulse 75   Ht 6' (1.829 m)   Wt 173 lb 12.8 oz (78.8 kg)   SpO2 93%   BMI 23.57 kg/m    Wt Readings from Last 3 Encounters:  11/21/22 173 lb 12.8 oz (78.8 kg)  09/20/22 169 lb 9.6 oz (76.9 kg)  06/25/22 170 lb (77.1 kg)    General: Well nourished, well developed, in no acute distress Head: Atraumatic, normal size  Eyes: PEERLA, EOMI  Neck: Supple, no JVD Endocrine: No thryomegaly Cardiac: Normal S1, S2; RRR; no murmurs, rubs, or gallops Lungs: Clear to auscultation bilaterally, no wheezing, rhonchi or rales  Abd: Soft, nontender, no  hepatomegaly  Ext: No edema, pulses 2+ Musculoskeletal: No deformities, BUE and BLE strength normal and equal Skin: Warm and dry, no rashes   Neuro: Alert and oriented to person, place, time, and situation, CNII-XII grossly intact, no focal deficits  Psych: Normal mood and affect   ASSESSMENT:   Terry [name] is a 77 y.o. male who presents for the following: 1. Neurogenic orthostatic hypotension (HCC)   2. Sinus pause   3. Coronary artery disease involving native coronary artery of native heart without angina pectoris   4. Stenosis of left carotid artery   5. Mesenteric artery insufficiency (HCC)   6. Mixed hyperlipidemia     PLAN:   1. Neurogenic orthostatic hypotension (HCC) -Has had syncopal episodes in the past.  Related to neurogenic orthostatic hypotension in the setting of Parkinson disease.  He is pursuing a conservative approach.  No indications for pacemaker.  He is wearing compression stockings.  Overall doing quite well.  He is only had 1 episode of passing out in the past year.  We discussed wearing compression stockings as well as elevating the head of the bed.  He works on this and is doing well.  2. Sinus pause -Vagally mediated.  No indications for pacing  3. Coronary artery disease involving native coronary artery of native heart without angina pectoris -Nonobstructive CAD.  On aspirin and statin.  At goal.  4. Stenosis of left carotid artery -Status post left CEA.  Follows with vascular surgery.  Needs yearly surveillance ultrasounds  5. Mesenteric artery insufficiency (HCC) -Concerns for possible mesenteric ischemia.  No symptoms from this.  Continue with aspirin and statin.  Follows with vascular.  6. Mixed hyperlipidemia -On statin.  No change.      Disposition: Return in about 1 year (around 11/21/2023).  Medication Adjustments/Labs and Tests Ordered: Current medicines are reviewed at length with the patient today.  Concerns regarding medicines are  outlined above.  No orders of the defined types were placed in this encounter.  No orders of the defined types were placed in this encounter.   Patient Instructions  Medication Instructions:  The current medical regimen is effective;  continue present plan and medications.  *If you need a refill on your cardiac  medications before your next appointment, please call your pharmacy*   Follow-Up: At [facility], you and your health needs are our priority.  As part of our continuing mission to provide you with exceptional heart care, we have created designated Provider Care Teams.  These Care Teams include your primary Cardiologist (physician) and Advanced Practice Providers (APPs -  Physician Assistants and Nurse Practitioners) who all work together to provide you with the care you need, when you need it.  We recommend signing up for the patient portal called "MyChart".  Sign up information is provided on this After Visit Summary.  MyChart is used to connect with patients for Virtual Visits (Telemedicine).  Patients are able to view lab/test results, encounter notes, upcoming appointments, etc.  Non-urgent messages can be sent to your provider as well.   To learn more about what you can do with MyChart, go to [email or web address].    Your next appointment:   12 month(s)  Provider:   [name], MD         Time Spent with Patient: I have spent a total of 25 minutes with patient reviewing hospital notes, telemetry, EKGs, labs and examining the patient as well as establishing an assessment and plan that was discussed with the patient.  > 50% of time was spent in direct patient care.  Signed, Lenna Gilford. Flora Lipps, MD, [facility]  [facility]  [address] [place], [place] [address] [phone]  11/21/2022 2:12 PM

## 2022-11-21 ENCOUNTER — Encounter: Payer: Self-pay | Admitting: Cardiovascular Disease

## 2022-11-21 ENCOUNTER — Ambulatory Visit: Payer: Medicare Other | Attending: Cardiovascular Disease | Admitting: Cardiovascular Disease

## 2022-11-21 VITALS — BP 150/72 | HR 75 | Ht 72.0 in | Wt 173.8 lb

## 2022-11-21 DIAGNOSIS — E782 Mixed hyperlipidemia: Secondary | ICD-10-CM

## 2022-11-21 DIAGNOSIS — I455 Other specified heart block: Secondary | ICD-10-CM | POA: Diagnosis not present

## 2022-11-21 DIAGNOSIS — G903 Multi-system degeneration of the autonomic nervous system: Secondary | ICD-10-CM | POA: Diagnosis not present

## 2022-11-21 DIAGNOSIS — I2510 Atherosclerotic heart disease of native coronary artery without angina pectoris: Secondary | ICD-10-CM | POA: Diagnosis not present

## 2022-11-21 DIAGNOSIS — I6522 Occlusion and stenosis of left carotid artery: Secondary | ICD-10-CM | POA: Diagnosis not present

## 2022-11-21 DIAGNOSIS — K551 Chronic vascular disorders of intestine: Secondary | ICD-10-CM

## 2022-11-21 NOTE — Patient Instructions (Signed)
Medication Instructions:  The current medical regimen is effective;  continue present plan and medications.  *If you need a refill on your cardiac medications before your next appointment, please call your pharmacy*   Follow-Up: At [facility], you and your health needs are our priority.  As part of our continuing mission to provide you with exceptional heart care, we have created designated Provider Care Teams.  These Care Teams include your primary Cardiologist (physician) and Advanced Practice Providers (APPs -  Physician Assistants and Nurse Practitioners) who all work together to provide you with the care you need, when you need it.  We recommend signing up for the patient portal called "MyChart".  Sign up information is provided on this After Visit Summary.  MyChart is used to connect with patients for Virtual Visits (Telemedicine).  Patients are able to view lab/test results, encounter notes, upcoming appointments, etc.  Non-urgent messages can be sent to your provider as well.   To learn more about what you can do with MyChart, go to [email or web address].    Your next appointment:   12 month(s)  Provider:   [place] T O'Neal, MD   

## 2022-11-23 ENCOUNTER — Ambulatory Visit: Payer: Medicare Other

## 2022-11-30 NOTE — Progress Notes (Signed)
[place] Loop Recorder 

## 2022-12-21 ENCOUNTER — Ambulatory Visit: Payer: Medicare Other | Attending: Cardiology | Admitting: Cardiology

## 2022-12-21 ENCOUNTER — Encounter: Payer: Self-pay | Admitting: Cardiology

## 2022-12-21 VITALS — BP 150/72 | HR 53 | Ht 72.0 in | Wt 173.4 lb

## 2022-12-21 DIAGNOSIS — R55 Syncope and collapse: Secondary | ICD-10-CM

## 2022-12-21 DIAGNOSIS — I10 Essential (primary) hypertension: Secondary | ICD-10-CM

## 2022-12-21 DIAGNOSIS — I455 Other specified heart block: Secondary | ICD-10-CM | POA: Diagnosis not present

## 2022-12-21 NOTE — Progress Notes (Signed)
  Electrophysiology Office Follow up Visit Note:    Date:  12/21/2022   ID:  Terry [name], DOB 1945-08-24, MRN [id number]  PCP:  Dois Davenport, MD  [facility] Cardiologist:  [name], MD  [facility] Electrophysiologist:  Lanier Prude, MD    Interval History:    Terry [name] is a 77 y.o. male who presents for a follow up visit.   He was last seen by [facility] September 20, 2022.  He has a history of PVD, stroke, hyperlipidemia and Parkinson's disease.  That appointment he reported being more active at his assisted living facility. He saw Dr. Flora Lipps November 21, 2022 and was stable.      Past medical, surgical, social and family history were reviewed.  ROS:   Please see the history of present illness.    All other systems reviewed and are negative.  EKGs/Labs/Other Studies Reviewed:    The following studies were reviewed today:  Loop interrogations reviewed.  Longest pause lasting 5.1 seconds    Physical Exam:    VS:  BP (!) 150/72   Pulse (!) 53   Ht 6' (1.829 m)   Wt 173 lb 6.4 oz (78.7 kg)   SpO2 96%   BMI 23.52 kg/m     Wt Readings from Last 3 Encounters:  12/21/22 173 lb 6.4 oz (78.7 kg)  11/21/22 173 lb 12.8 oz (78.8 kg)  09/20/22 169 lb 9.6 oz (76.9 kg)     GEN:  Well nourished, well developed in no acute distress.  Elderly.  Tremors consistent with Parkinson's diagnosis CARDIAC: RRR, no murmurs, rubs, gallops RESPIRATORY:  Clear to auscultation without rales, wheezing or rhonchi       ASSESSMENT:    1. Syncope and collapse   2. Sinus pause   3. Primary hypertension    PLAN:    In order of problems listed above:   #Send pauses #Dizziness #Orthostatic hypotension Encouraged him to stay adequately hydrated.  Continue using compression socks  #Sinus pauses Vagally mediated.  No indication for permanent pacing at this time. We did discuss the possibility of needing a permanent pacemaker in the future should his sinus pauses  become longer.  We discussed how a permanent pacemaker would be used to prevent personal injury and would not necessarily prolong life given his circumstances.  He was very understanding.  Will continue monitoring using his loop recorder for now.   Follow-up 6 months with EP APP.     Signed, Steffanie Dunn, MD, [facility], [employer] [facility] 12/21/2022 9:41 PM    Electrophysiology [facility]

## 2022-12-21 NOTE — Patient Instructions (Signed)
Medication Instructions:  Your physician recommends that you continue on your current medications as directed. Please refer to the Current Medication list given to you today.  *If you need a refill on your cardiac medications before your next appointment, please call your pharmacy*  Follow-Up: At [facility], you and your health needs are our priority.  As part of our continuing mission to provide you with exceptional heart care, we have created designated Provider Care Teams.  These Care Teams include your primary Cardiologist (physician) and Advanced Practice Providers (APPs -  Physician Assistants and Nurse Practitioners) who all work together to provide you with the care you need, when you need it.  Your next appointment:   6 month(s)  Provider:   You will see one of the following Advanced Practice Providers on your designated Care Team:   Renee Ursuy, PA-C Michael "Andy" Tillery, PA-C Suzann Riddle, NP   

## 2022-12-22 ENCOUNTER — Ambulatory Visit (INDEPENDENT_AMBULATORY_CARE_PROVIDER_SITE_OTHER): Payer: Medicare Other

## 2022-12-22 DIAGNOSIS — R55 Syncope and collapse: Secondary | ICD-10-CM | POA: Diagnosis not present

## 2022-12-22 LAB — CUP PACEART REMOTE DEVICE CHECK
Date Time Interrogation Session: 20240530144100
Implantable Pulse Generator Implant Date: 20230523
Pulse Gen Serial Number: 175044

## 2022-12-25 ENCOUNTER — Ambulatory Visit: Payer: Medicare Other

## 2023-01-09 NOTE — Progress Notes (Signed)
Carelink Summary Report / Loop Recorder 

## 2023-01-16 ENCOUNTER — Telehealth: Payer: Self-pay

## 2023-01-16 NOTE — Telephone Encounter (Signed)
Pt is going to bring his phone monitor to the office tomorrow to get help setting it up.

## 2023-01-16 NOTE — Telephone Encounter (Signed)
Spoke with pt he states his phone died that's why it hung up. I have given pt the number to tech support for him to call back

## 2023-01-16 NOTE — Telephone Encounter (Signed)
Patient's wife is returning call. Requesting return call to husband phone.

## 2023-01-16 NOTE — Telephone Encounter (Signed)
Patient called in stating he is having issues with monitor. We have called [employer] tech support but patient call dropped in the middle of troubleshooting and I was not able to get him back on the phone. Tech support waited on the line for a few mins while I tried to call multiple times. If patient calls back he needs to call tech support [phone] option 5

## 2023-01-22 ENCOUNTER — Ambulatory Visit (INDEPENDENT_AMBULATORY_CARE_PROVIDER_SITE_OTHER): Payer: Medicare Other

## 2023-01-22 DIAGNOSIS — R55 Syncope and collapse: Secondary | ICD-10-CM | POA: Diagnosis not present

## 2023-01-26 ENCOUNTER — Ambulatory Visit: Payer: Medicare Other

## 2023-01-29 ENCOUNTER — Other Ambulatory Visit: Payer: Self-pay

## 2023-01-29 ENCOUNTER — Encounter (HOSPITAL_COMMUNITY): Payer: Self-pay

## 2023-01-29 ENCOUNTER — Emergency Department (HOSPITAL_COMMUNITY)
Admission: EM | Admit: 2023-01-29 | Discharge: 2023-01-30 | Discharge status: Against Medical Advice | Payer: Medicare Other | Attending: Emergency Medicine | Admitting: Emergency Medicine

## 2023-01-29 DIAGNOSIS — I10 Essential (primary) hypertension: Secondary | ICD-10-CM | POA: Diagnosis not present

## 2023-01-29 DIAGNOSIS — Z5321 Procedure and treatment not carried out due to patient leaving prior to being seen by health care provider: Secondary | ICD-10-CM | POA: Insufficient documentation

## 2023-01-29 LAB — CUP PACEART REMOTE DEVICE CHECK
Date Time Interrogation Session: 20240707100500
Implantable Pulse Generator Implant Date: 20230523
Pulse Gen Serial Number: 175044

## 2023-01-29 LAB — CBC WITH DIFFERENTIAL/PLATELET
Abs Immature Granulocytes: 0.01 10*3/uL (ref 0.00–0.07)
Basophils Absolute: 0 10*3/uL (ref 0.0–0.1)
Basophils Relative: 0 %
Eosinophils Absolute: 0.1 10*3/uL (ref 0.0–0.5)
Eosinophils Relative: 2 %
HCT: 41.7 % (ref 39.0–52.0)
Hemoglobin: 13.9 g/dL (ref 13.0–17.0)
Immature Granulocytes: 0 %
Lymphocytes Relative: 35 %
Lymphs Abs: 3 10*3/uL (ref 0.7–4.0)
MCH: 29.7 pg (ref 26.0–34.0)
MCHC: 33.3 g/dL (ref 30.0–36.0)
MCV: 89.1 fL (ref 80.0–100.0)
Monocytes Absolute: 0.8 10*3/uL (ref 0.1–1.0)
Monocytes Relative: 9 %
Neutro Abs: 4.6 10*3/uL (ref 1.7–7.7)
Neutrophils Relative %: 54 %
Platelets: 191 10*3/uL (ref 150–400)
RBC: 4.68 MIL/uL (ref 4.22–5.81)
RDW: 13.6 % (ref 11.5–15.5)
WBC: 8.5 10*3/uL (ref 4.0–10.5)
nRBC: 0 % (ref 0.0–0.2)

## 2023-01-29 LAB — BASIC METABOLIC PANEL
Anion gap: 9 (ref 5–15)
BUN: 16 mg/dL (ref 8–23)
CO2: 24 mmol/L (ref 22–32)
Calcium: 8.5 mg/dL — ABNORMAL LOW (ref 8.9–10.3)
Chloride: 103 mmol/L (ref 98–111)
Creatinine, Ser: 0.88 mg/dL (ref 0.61–1.24)
GFR, Estimated: >60 mL/min (ref >60)
Glucose, Bld: 81 mg/dL (ref 70–99)
Potassium: 4 mmol/L (ref 3.5–5.1)
Sodium: 136 mmol/L (ref 135–145)

## 2023-01-29 NOTE — ED Provider Triage Note (Signed)
Emergency Medicine Provider Triage Evaluation Note  Terry [name] , a 77 y.o. male  was evaluated in triage.  Pt complains of dizzy, resolved after BP meds.  Review of Systems  Positive: dizzy Negative: Chest pain  Physical Exam  BP (!) 143/76   Pulse 74   Temp 98.4 F (36.9 C) (Oral)   Resp 17   SpO2 96%  Gen:   Awake, no distress   Resp:  Normal effort  MSK:   Moves extremities without difficulty  Other:    Medical Decision Making  Medically screening exam initiated at 6:28 PM.  Appropriate orders placed.  Terry [name] was informed that the remainder of the evaluation will be completed by another provider, this initial triage assessment does not replace that evaluation, and the importance of remaining in the ED until their evaluation is complete.     Virgina Norfolk, DO 01/29/23 Silva Bandy

## 2023-01-29 NOTE — ED Triage Notes (Addendum)
Patient arrives via EMS from home. Patient has had dizziness and headache with elevated BP. First BP was 210/80 with EMS. Patient also reports frequency with urination. No burning or discomfort. While in triage he states his headache is nearly gone. Patient states he took his 6am BP medications, but has not taken his 6pm medications. Patient takes amlodipine and valsartan. BP in triage was 143/76 (86). Also denies dizziness. States he feels tired. Takes 81mg  ASA.

## 2023-01-30 NOTE — ED Notes (Signed)
Pt stated he is leaving, he's been waiting too long

## 2023-01-31 ENCOUNTER — Telehealth: Payer: Self-pay | Admitting: Cardiovascular Disease

## 2023-01-31 NOTE — Telephone Encounter (Signed)
Spoke to the pt, on 7/8 had OV with PCP for sinus pressure and headache was sent to [facility] ED due to elevated blood pressure. Pt stated while in route to the ED his blood pressure was WNL. While at the hospital pt stated he waited 8 hours to be seen and left AMA. As of today,pt still has " sinus headache" he was taking Allegria for his symptoms. Pt stated his blood pressure is unstable, his PCP did not make an medication changes. Will forward MD and nurse for advise.    Blood pressure   7/8 230/87 rechecked 2 hours later 125/81  7/9 129/67  7/10 139/67

## 2023-01-31 NOTE — Telephone Encounter (Signed)
Patient returned LPN's call. 

## 2023-01-31 NOTE — Telephone Encounter (Signed)
Left voicemail message for the pt to call the clinic. 

## 2023-01-31 NOTE — Telephone Encounter (Signed)
Spoke to the pt, explained Dr. Flora Lipps advise:  Would try to schedule him next available or have him seen as a DOD appointment this week.   Patient scheduled with DOD on 7/12@1330 , pt voiced understanding.

## 2023-01-31 NOTE — Telephone Encounter (Signed)
Pt c/o BP issue: STAT if pt c/o blurred vision, one-sided weakness or slurred speech  1. What are your last 5 BP readings? 8:30 AM  139/67, yesterday was 129/40 and 07/08 230/87  2. Are you having any other symptoms (ex. Dizziness, headache, blurred vision, passed out)? Dizziness   3. What is your BP issue? Pt stated that their bp has been fluctuating up and down for the past month. Patient stated they feel like they have bad sinuses, there is pressure in his forehead, sometimes he feels like his scalp is moving, and has some dizziness as well. Patient stated they went to see their PCP and the PCP recommended for them to see the cardiologist based upon their symptoms. Please advise.

## 2023-01-31 NOTE — Telephone Encounter (Signed)
Patient returned call

## 2023-02-01 NOTE — Progress Notes (Signed)
Cardiology Office Note:    Date:  02/02/2023   ID:  Terry [name], DOB 1946/06/27, MRN [id number]  PCP:  Dois Davenport, MD   [facility] Providers Primary Cardiologist: Lennie Odor DOD Cardiologist:  Alverda Skeans, MD Referring MD: Dois Davenport, MD   Chief Complaint/Reason for Referral:  DOD appt for dizziness, high BP  ASSESSMENT:    1. Primary hypertension   2. Neurogenic orthostatic hypotension (HCC)   3. Coronary artery disease involving native coronary artery of native heart without angina pectoris   4. Stenosis of left carotid artery   5. Mesenteric artery insufficiency (HCC)   6. Mixed hyperlipidemia   7. Aortic atherosclerosis (HCC)     PLAN:    In order of problems listed above:   Hypertension: I think his fluctuating blood pressures and orthostatic hypotension have been exacerbated by the introduction of Nuplazid.  I offered him a trial of being off Nuplazid for 2 weeks but he would like to confer with his neurologist about this.  I have asked him to reach out to his neurologist regarding this medication and I will send my note to him as well.  I have asked him to check his blood pressures and if they are low to hold his amlodipine and valsartan.  Neurogenic orthostatic hypotension: See discussion above.  Mild coronary artery disease: Continue aspirin and atorvastatin.  Carotid disease: Continue current therapy.  Peripheral arterial disease: Continue current therapy.  Hyperlipidemia: Check lipid panel and LFTs today.  Aortic atherosclerosis: Continue aspirin and atorvastatin.             Dispo:  No follow-ups on file.      Medication Adjustments/Labs and Tests Ordered: Current medicines are reviewed at length with the patient today.  Concerns regarding medicines are outlined above.  The following changes have been made: None  Labs/tests ordered: No orders of the defined types were placed in this encounter.   Medication Changes: No  orders of the defined types were placed in this encounter.   Current medicines are reviewed at length with the patient today.  The patient does not have concerns regarding medicines.  History of Present Illness:    FOCUSED PROBLEM LIST:   1. Non-obstructive CAD -LHC 11/25/2019 with 20% mid LAD 2. Parkinson's  3. 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 PAD (abdominal) -SMA >70% -IMA >70% 6. HLD -Total cholesterol 78, HDL 33, LDL 38, triglycerides 34 7. Sinus pauses  -vagally mediated 8. Neurogenic orthostatic hypotension 9.  Hypertension 10.  Aortic atherosclerosis on CT A/P 2022  The patient is a 77 y.o. male with the indicated medical history here for ED follow up.  The patient was seen in the emergency department earlier this week due to dizziness.  Apparently he developed dizziness and a headache without chest pain.  He called EMS and initial blood pressure was 210/80.  He was brought to the emergency department.  Blood pressure in the emergency department after taking blood pressure medications was 143/76.  Laboratories were reassuring and EKG demonstrated sinus rhythm with first-degree AV block.  The patient ended up leaving the emergency department due to the long wait.  He is here for emergency room follow-up.  He does have an indwelling ILR monitor and this was interrogated the day prior to his presentation and showed no atrial fibrillation.  We were able to arrange an interrogation since his last interrogation and there were no events noted.  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  On review of Nuplazid's adverse information data it is associated with a 7% risk of increased orthostatic hypotensive events.  He started this about 6 weeks ago.  The patient tells me he has noticed increasing fluctuation blood pressures and headaches since that time.  He denies any chest  pain, significant shortness of breath, peripheral edema, or paroxysmal nocturnal dyspnea.       Previous Medical History: Past Medical History:  Diagnosis Date   Carotid artery stenosis 04/29/2018   Chronic tension headaches 04/29/2018   Essential hypertension 04/29/2018   GERD (gastroesophageal reflux disease) 04/29/2018   HLD (hyperlipidemia) 04/29/2018   Parkinson's disease 04/29/2018     Current Medications: Current Meds  Medication Sig   acetaminophen (TYLENOL) 500 MG tablet Take 500 mg by mouth every 6 (six) hours as needed for moderate pain or headache.   amLODipine (NORVASC) 2.5 MG tablet Take 2.5 mg by mouth See admin instructions. Take blood pressure twice a day and send to the doctor via e-mail ,and she responds if need to take or not   aspirin EC 81 MG EC tablet Take 1 tablet (81 mg total) by mouth daily. Swallow whole.   atorvastatin (LIPITOR) 20 MG tablet TAKE ONE TABLET BY MOUTH EVERY NIGHT AT BEDTIME   brimonidine-timolol (COMBIGAN) 0.2-0.5 % ophthalmic solution Apply 1 drop to eye 2 (two) times daily.   carbidopa-levodopa (SINEMET CR) 50-200 MG tablet Take 1 tablet by mouth at bedtime.   carbidopa-levodopa (SINEMET IR) 25-100 MG tablet Take 2.5 tablets by mouth 3 (three) times daily. Patient also takes 2.5 tablets once daily   cetirizine (ZYRTEC) 10 MG tablet Take 10 mg by mouth as needed for allergies.   Cholecalciferol (VITAMIN D3) 1.25 MG (50000 UT) CAPS Take 50,000 Units by mouth once a week.   Cyanocobalamin (VITAMIN B-12 IJ) Inject 1,000 mcg as directed every 30 (thirty) days.   fluticasone (FLONASE) 50 MCG/ACT nasal spray Place 1 spray into both nostrils daily as needed for allergies.    meloxicam (MOBIC) 7.5 MG tablet Take 7.5 mg by mouth 2 (two) times daily as needed.   NEUPRO 4 MG/24HR Place 1 patch onto the skin every morning.   NUPLAZID 34 MG CAPS Take 1 capsule by mouth daily.   olopatadine (PATADAY) 0.1 % ophthalmic solution Place 1 drop into both eyes daily  as needed for allergies.   pantoprazole (PROTONIX) 40 MG tablet TAKE 1 TABLET BY MOUTH EVERY DAY   Prenatal Vit-Fe Fumarate-FA (M-NATAL PLUS) 27-1 MG TABS Take 1 tablet by mouth daily.   traZODone (DESYREL) 50 MG tablet Take 1 tablet (50 mg total) by mouth at bedtime as needed for sleep. (Patient taking differently: Take 50 mg by mouth at bedtime.)   valsartan (DIOVAN) 40 MG tablet Take 40 mg by mouth 2 (two) times daily.   [DISCONTINUED] cyclobenzaprine (FLEXERIL) 5 MG tablet Take 1-2 tablets (5-10 mg total) by mouth 2 (two) times daily as needed for muscle spasms.   [DISCONTINUED] LINZESS 72 MCG capsule TAKE 1 CAPSULE BY MOUTH DAILY BEFORE BREAKFAST.   [DISCONTINUED] meloxicam (MOBIC) 15 MG tablet Take 7.5 mg by mouth at bedtime as needed for pain.     Allergies:    Penicillins, Erythromycin, and Clam shell   Social History:   Social History   Tobacco Use   Smoking status: Never    Passive exposure: Never   Smokeless tobacco: Former  Types: Snuff  Vaping Use   Vaping status: Never Used  Substance Use Topics   Alcohol use: Yes    Comment: Occasionally.   Drug use: Never     Family Hx: Family History  Problem Relation Age of Onset   Heart disease Mother    Cancer Father    Parkinson's disease Sister    Colon cancer Neg Hx    Stomach cancer Neg Hx    Esophageal cancer Neg Hx    Inflammatory bowel disease Neg Hx    Liver disease Neg Hx    Pancreatic cancer Neg Hx    Rectal cancer Neg Hx      Review of Systems:   Please see the history of present illness.    All other systems reviewed and are negative.     EKGs/Labs/Other Test Reviewed:    EKG: None performed today.  EKG Interpretation Date/Time:    Ventricular Rate:    PR Interval:    QRS Duration:    QT Interval:    QTC Calculation:   R Axis:      Text Interpretation:           Prior CV studies reviewed:  Cardiac Studies & Procedures   CARDIAC CATHETERIZATION  CARDIAC CATHETERIZATION  11/25/2019  Narrative  Normal left ventricular function with EF 65%.  Normal LVEDP.  Normal left main.  Luminal irregularities in the proximal to mid LAD less than 20%.  Large ramus intermedius with luminal irregularities.  Widely patent.  Dominant right coronary without evidence of obstruction or atherosclerosis.  RECOMMENDATIONS:   Continue preventive therapy for CAD.  Dyspnea on exertion could be related to diastolic dysfunction related to age or deconditioning.  Findings Coronary Findings Diagnostic  Dominance: Right  Left Anterior Descending Prox LAD to Mid LAD lesion is 20% stenosed.  Ramus Intermedius Vessel is large.  Left Circumflex  First Obtuse Marginal Branch Vessel is small in size.  Second Obtuse Marginal Branch Vessel is small in size.  Intervention  No interventions have been documented.     ECHOCARDIOGRAM  ECHOCARDIOGRAM COMPLETE 04/24/2021  Narrative ECHOCARDIOGRAM REPORT    Patient Name:   Terry [name] Date of Exam: 04/24/2021 Medical Rec #:  [id number]     Height:       73.0 in Accession #:    [id number]    Weight:       184.0 lb Date of Birth:  23-Jun-1946     BSA:          2.077 m Patient Age:    74 years      BP:           164/61 mmHg Patient Gender: M             HR:           69 bpm. Exam Location:  Inpatient  Procedure: 2D Echo, Cardiac Doppler and Color Doppler  Indications:    Stroke I63.9  History:        Patient has prior history of Echocardiogram examinations, most recent 10/31/2019. Carotid Disease; Risk Factors:Dyslipidemia and Hypertension. Parkinsons.  Sonographer:    Leta Jungling RDCS Referring Phys: [id number] Angie Fava  IMPRESSIONS   1. Left ventricular ejection fraction, by estimation, is 65 to 70%. The left ventricle has normal function. The left ventricle has no regional wall motion abnormalities. There is moderate left ventricular hypertrophy. Left ventricular diastolic parameters are  indeterminate. 2. Right ventricular systolic function is normal. The right ventricular size  is normal. 3. Left atrial size was mildly dilated. 4. Right atrial size was mildly dilated. 5. The mitral valve is grossly normal. No evidence of mitral valve regurgitation. 6. The aortic valve is tricuspid. Aortic valve regurgitation is not visualized.  Comparison(s): Prior images reviewed side by side. No significant change in LVEF.  FINDINGS Left Ventricle: Left ventricular ejection fraction, by estimation, is 65 to 70%. The left ventricle has normal function. The left ventricle has no regional wall motion abnormalities. The left ventricular internal cavity size was normal in size. There is moderate left ventricular hypertrophy. Left ventricular diastolic parameters are indeterminate.  Right Ventricle: The right ventricular size is normal. No increase in right ventricular wall thickness. Right ventricular systolic function is normal.  Left Atrium: Left atrial size was mildly dilated.  Right Atrium: Right atrial size was mildly dilated.  Pericardium: There is no evidence of pericardial effusion.  Mitral Valve: The mitral valve is grossly normal. No evidence of mitral valve regurgitation.  Tricuspid Valve: The tricuspid valve is grossly normal. Tricuspid valve regurgitation is mild.  Aortic Valve: The aortic valve is tricuspid. There is mild to moderate aortic valve annular calcification. Aortic valve regurgitation is not visualized.  Pulmonic Valve: The pulmonic valve was grossly normal. Pulmonic valve regurgitation is trivial.  Aorta: The aortic root is normal in size and structure.  IAS/Shunts: The interatrial septum appears to be lipomatous. No atrial level shunt detected by color flow Doppler.   LEFT VENTRICLE PLAX 2D LVIDd:         4.60 cm  Diastology LV PW:         1.10 cm  LV e' medial:    6.00 cm/s LV IVS:        1.20 cm  LV E/e' medial:  11.4 LVOT diam:     2.00 cm  LV e'  lateral:   6.08 cm/s LV SV:         62       LV E/e' lateral: 11.2 LV SV Index:   30 LVOT Area:     3.14 cm   RIGHT VENTRICLE RV S prime:     15.30 cm/s TAPSE (M-mode): 3.7 cm  LEFT ATRIUM             Index       RIGHT ATRIUM           Index LA diam:        2.80 cm 1.35 cm/m  RA Area:     22.80 cm LA Vol (A2C):   58.5 ml 28.17 ml/m RA Volume:   71.10 ml  34.24 ml/m LA Vol (A4C):   75.2 ml 36.21 ml/m LA Biplane Vol: 67.9 ml 32.70 ml/m AORTIC VALVE LVOT Vmax:   105.00 cm/s LVOT Vmean:  68.500 cm/s LVOT VTI:    0.197 m  AORTA Ao Root diam: 3.00 cm Ao Asc diam:  3.00 cm  MITRAL VALVE MV Area (PHT): 3.16 cm    SHUNTS MV Decel Time: 240 msec    Systemic VTI:  0.20 m MV E velocity: 68.40 cm/s  Systemic Diam: 2.00 cm MV A velocity: 82.30 cm/s MV E/A ratio:  0.83  Nona Dell MD Electronically signed by Nona Dell MD Signature Date/Time: 04/24/2021/3:25:55 PM    Final    MONITORS  LONG TERM MONITOR-LIVE TELEMETRY (3-14 DAYS) 08/15/2021  Narrative Enrollment 08/01/2021-08/08/2021 (6 days 21 hours). Patient had a min HR of 22 bpm (sinus bradycardia with sinus pause), max HR of 141 bpm (  supraventricualr tachycardia), and avg HR of 71 bpm (normal sinus rhythm). Predominant underlying rhythm was Sinus Rhythm. First Degree AV Block was present. 6 Supraventricular Tachycardia runs occurred, the run with the fastest interval lasting 20 beats (9.5 seconds) with a max rate of 141 bpm (avg 122 bpm); the run with the fastest interval was also the longest. 3 Pauses occurred, the longest lasting 4.4 secs (14 bpm). Second Degree AV Block-Mobitz I (Wenckebach) was present. Isolated SVEs were rare (<1.0%), SVE Couplets were rare (<1.0%), and SVE Triplets were rare (<1.0%). Isolated VEs were rare (<1.0%, 63), VE Couplets were rare (<1.0%, 2), and VE Triplets were rare (<1.0%, 1).  Impression: 1. Episode of sinus pause 08/07/2021 @ 2:32 pm which possibly represent paroxysmal AV block  (vagal mediated) due to lengthening of the P-P interval with dropped QRS complexes. Total duration 4.4 seconds. 2. Second degree AV block, Mobitz 1 present. 3. Brief SVT was present, likely atrial tachycardia (6 episodes, longest 9.5 seconds).  Gerri Spore T. Flora Lipps, MD, [facility]  [facility] [address] [place], [place] [address] [phone] 12:17 PM           Other studies Reviewed: Aortic atherosclerosis on CT A/P 2022  Recent Labs: 06/25/2022: ALT 7 01/29/2023: BUN 16; Creatinine, Ser 0.88; Hemoglobin 13.9; Platelets 191; Potassium 4.0; Sodium 136   Lipid Panel    Component Value Date/Time   CHOL 78 04/28/2021 0445   CHOL 99 (L) 12/10/2019 1112   TRIG 52 04/28/2021 0445   HDL 37 (L) 04/28/2021 0445   HDL 47 12/10/2019 1112   CHOLHDL 2.1 04/28/2021 0445   VLDL 10 04/28/2021 0445   LDLCALC 31 04/28/2021 0445   LDLCALC 39 12/10/2019 1112    Risk Assessment/Calculations:                Physical Exam:    VS:  BP (!) 80/50   Pulse 62   Ht 6' (1.829 m)   Wt 174 lb (78.9 kg)   SpO2 94%   BMI 23.60 kg/m    Wt Readings from Last 3 Encounters:  02/02/23 174 lb (78.9 kg)  12/21/22 173 lb 6.4 oz (78.7 kg)  11/21/22 173 lb 12.8 oz (78.8 kg)         GENERAL:  No apparent distress, AOx3 HEENT:  No carotid bruits, +2 carotid impulses, no scleral icterus CAR: RRR  no murmurs, gallops, rubs, or thrills RES:  Clear to auscultation bilaterally ABD:  Soft, nontender, nondistended, positive bowel sounds x 4 VASC:  +2 radial pulses, +2 carotid pulses, palpable pedal pulses NEURO:  CN 2-12 grossly intact; motor and sensory grossly intact; positive tremor PSYCH:  No active depression or anxiety EXT:  No edema, ecchymosis, or cyanosis  Signed, Orbie Pyo, MD  02/02/2023 1:49 PM    [facility] [address] [place], [place], [place]  [address] Phone: [phone]; Fax: [phone]   Note:  This document was prepared using  Dragon voice recognition software and may include unintentional dictation errors.

## 2023-02-02 ENCOUNTER — Encounter: Payer: Self-pay | Admitting: Internal Medicine

## 2023-02-02 ENCOUNTER — Ambulatory Visit: Payer: Medicare Other | Attending: Internal Medicine | Admitting: Internal Medicine

## 2023-02-02 VITALS — BP 80/50 | HR 62 | Ht 72.0 in | Wt 174.0 lb

## 2023-02-02 DIAGNOSIS — I10 Essential (primary) hypertension: Secondary | ICD-10-CM

## 2023-02-02 DIAGNOSIS — K551 Chronic vascular disorders of intestine: Secondary | ICD-10-CM

## 2023-02-02 DIAGNOSIS — E782 Mixed hyperlipidemia: Secondary | ICD-10-CM

## 2023-02-02 DIAGNOSIS — G903 Multi-system degeneration of the autonomic nervous system: Secondary | ICD-10-CM | POA: Diagnosis not present

## 2023-02-02 DIAGNOSIS — I6522 Occlusion and stenosis of left carotid artery: Secondary | ICD-10-CM

## 2023-02-02 DIAGNOSIS — I700 Atherosclerosis of aorta: Secondary | ICD-10-CM

## 2023-02-02 DIAGNOSIS — I2510 Atherosclerotic heart disease of native coronary artery without angina pectoris: Secondary | ICD-10-CM

## 2023-02-02 NOTE — Patient Instructions (Signed)
Medication Instructions:  Your physician recommends that you continue on your current medications as directed. Please refer to the Current Medication list given to you today.  *If you need a refill on your cardiac medications before your next appointment, please call your pharmacy*   Follow-Up: At [facility], you and your health needs are our priority.  As part of our continuing mission to provide you with exceptional heart care, we have created designated Provider Care Teams.  These Care Teams include your primary Cardiologist (physician) and Advanced Practice Providers (APPs -  Physician Assistants and Nurse Practitioners) who all work together to provide you with the care you need, when you need it.   Your next appointment:   4 week(s)  Provider:   APP

## 2023-02-12 NOTE — Progress Notes (Signed)
[place] Loop Recorder 

## 2023-02-22 ENCOUNTER — Ambulatory Visit (INDEPENDENT_AMBULATORY_CARE_PROVIDER_SITE_OTHER): Payer: Medicare Other

## 2023-02-22 DIAGNOSIS — R55 Syncope and collapse: Secondary | ICD-10-CM | POA: Diagnosis not present

## 2023-02-26 ENCOUNTER — Ambulatory Visit: Payer: Medicare Other

## 2023-03-05 ENCOUNTER — Ambulatory Visit (INDEPENDENT_AMBULATORY_CARE_PROVIDER_SITE_OTHER): Payer: Medicare Other

## 2023-03-05 DIAGNOSIS — R55 Syncope and collapse: Secondary | ICD-10-CM

## 2023-03-07 ENCOUNTER — Telehealth: Payer: Self-pay

## 2023-03-07 NOTE — Progress Notes (Signed)
Carelink Summary Report / Loop Recorder 

## 2023-03-07 NOTE — Telephone Encounter (Signed)
Called pt to inform him of the below instructions from Dr. Lynnette Caffey. Left a message to call back.

## 2023-03-07 NOTE — Telephone Encounter (Signed)
Pt returning call

## 2023-03-07 NOTE — Telephone Encounter (Signed)
Follow Up:      Patient said he was returning a call from today.

## 2023-03-07 NOTE — Telephone Encounter (Signed)
Spoke with patient he has appointment already scheduled for Friday 8/16. He is aware to increase amlodipine to 5 mg. He verbalized understanding

## 2023-03-07 NOTE — Telephone Encounter (Signed)
Spoke w/ patient. Has not passed out in the last month. Does feel lightheaded at times that do not correlate with any thing he can tell. While speaking with patient he mentioned his SBP the last week has been close to or exceeded 200 in the mornings and a few hours after taking BP medications down to 150's. Pt reports a few days ago his SBP was 233 in the morning. Has appointment 8/16 w/ EP. Confirmed medication regimen w/ patient.

## 2023-03-07 NOTE — Telephone Encounter (Signed)
ILR alert for 15 pause events, received 8/14 Events occurred 7/12-8/8, longest duration 6.1sec per device, likely underlying heart block with enhancement of EGM Pauses range 3.1-6.1sec in duration, not all during sleeping hours 2 symptom activation's for SOB and fluttering, NSR Route to triage LA, CVRS  8/14- CJL LMTCB

## 2023-03-09 ENCOUNTER — Ambulatory Visit: Payer: Medicare Other | Attending: Physician Assistant | Admitting: Physician Assistant

## 2023-03-09 ENCOUNTER — Telehealth: Payer: Self-pay

## 2023-03-09 ENCOUNTER — Encounter: Payer: Self-pay | Admitting: Physician Assistant

## 2023-03-09 VITALS — BP 166/70 | HR 71 | Ht 72.0 in | Wt 176.6 lb

## 2023-03-09 DIAGNOSIS — I459 Conduction disorder, unspecified: Secondary | ICD-10-CM

## 2023-03-09 DIAGNOSIS — I455 Other specified heart block: Secondary | ICD-10-CM | POA: Diagnosis not present

## 2023-03-09 DIAGNOSIS — I10 Essential (primary) hypertension: Secondary | ICD-10-CM | POA: Diagnosis not present

## 2023-03-09 DIAGNOSIS — Z95818 Presence of other cardiac implants and grafts: Secondary | ICD-10-CM

## 2023-03-09 DIAGNOSIS — I951 Orthostatic hypotension: Secondary | ICD-10-CM | POA: Diagnosis not present

## 2023-03-09 NOTE — Patient Instructions (Addendum)
Medication Instructions:   START TAKING :  EXTRA AMLODIPINE 2.5 MG ( HALF TABLET)   IF BLOOD PRESSURE TOP NUMBER RUNNING HIGHER THAN 180  AS NEEDED    *If you need a refill on your cardiac medications before your next appointment, please call your pharmacy*   Lab Work: NONE ORDERED  TODAY    If you have labs (blood work) drawn today and your tests are completely normal, you will receive your results only by: MyChart Message (if you have MyChart) OR A paper copy in the mail If you have any lab test that is abnormal or we need to change your treatment, we will call you to review the results.   Testing/Procedures: NONE ORDERED  TODAY     Follow-Up: At [facility], you and your health needs are our priority.  As part of our continuing mission to provide you with exceptional heart care, we have created designated Provider Care Teams.  These Care Teams include your primary Cardiologist (physician) and Advanced Practice Providers (APPs -  Physician Assistants and Nurse Practitioners) who all work together to provide you with the care you need, when you need it.  We recommend signing up for the patient portal called "MyChart".  Sign up information is provided on this After Visit Summary.  MyChart is used to connect with patients for Virtual Visits (Telemedicine).  Patients are able to view lab/test results, encounter notes, upcoming appointments, etc.  Non-urgent messages can be sent to your provider as well.   To learn more about what you can do with MyChart, go to [email or web address].    Your next appointment:   3 week(s)  Provider:   [name], MD /APP   3 MONTHS WITH DR Lalla Brothers ( CONTACT EP [facility])    Other Instructions

## 2023-03-09 NOTE — Telephone Encounter (Signed)
Erroneous encounter

## 2023-03-09 NOTE — Progress Notes (Signed)
Cardiology Office Note Date:  03/09/2023  Patient ID:  Terry [name], Terry [name] 1945-09-25, MRN [id number] PCP:  Dois Davenport, MD  Cardiologist:  Dr. Scharlene Gloss Electrophysiologist: Dr. Lalla Brothers    Chief Complaint:    f/u per Dr. Lynnette Caffey  History of Present Illness: Terry [name] is a 77 y.o. 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 Myra Gianotti), stroke, no obstructive CAD, HLD, Parkinsons disease,   He saw Dr. Scharlene Gloss Feb 2023, dizziness was mostly positional and por orthostatic sounding, he had markedly abnormal orthostatic vitals.  Ongoing weight loss being evaluated for mesenteric vascular disease He had a monitor with sinus pause that appeared to be vagal with P-P prolongation ahead, though with reports of syncope. Felt further evaluation was indicated and referred to EP. Though his symptoms more consistent w/o orthostatic and volume depletion. Advised better hydration and conpression  Subsequent loop implant  I saw him 02/01/22 to evaluate pauses on his loop He comes accompanied by his wife. He has not had syncope since his loop implant His neurologist recommended that he wear support stockings, his wife mentions that this did infact seem to help his symptoms, but he does not tend to actually wear them He does have orthostatic dizziness He does NOT have episodic symptoms, no moments of dizziness/weakness He has days of feeling poorly all day long, or some days all morning  He had a number of sinus pauses Longest 5.4 seconds may 26th He had a 3s pause 01/31/22 02: 29AM, slight sinus slowing leading to it, and ? If it comes out in a 2:1 AVblock, though V rates are very good in the 60's, perhaps a U wave. Other sinus pauses do happen in the day time, not with any suggestion of heart block None of them seem to correlate clearly with symptoms Pauses suspect to be incidental and not clearly the cause of any symptoms, advised hydration,  compression wear, symptom log with his device  He saw Dr. Lalla Brothers 03/22/22, BP up/down, no recurrent syncope since improved hydration, overall significantly slowed over the years, actively working on weight loss.  Pending move to retirement home Did not think there is a clear correlation between his symptoms in the past and AV conduction abnormalities. We will continue loop recorder monitoring for any evidence of sustained AV conduction abnormalities.   Device alert note 09/18/22 with some pauses  I saw him Feb 2024 He is accompanied by hs wife. No near syncope or syncope, no spells of dizziness. He has days that he feels a bit lightheaded, more weak, though this is an all day symptom, not moments in times. No syncope. No cardiac awareness of any kind They live at [facility] now and he as started walking more there in the fitness area, and his wife reports doing pretty well, but they both agree, the Parkinson's meds dont seem to help as much as they/neurology team had hoped. 5 pause episodes All have sinus slowing associated with pauses, these are associated with brief AVblock  Longed 4.6 seconds Occur during day time No changes were made  He saw Dr. Scharlene Gloss April 2024, stable  He saw Dr. Lalla Brothers 12/21/22, discussed pauses as vagally mediated, discussed perhaps PPM in the future if pauses become longer in effort to prevent falls, injury, planned for ongoing monitoring  ER visit 01/30/23 for CP/dizziness, left after prolonged wait time  Saw Dr. Lynnette Caffey 02/02/23 for DOD visit Felt orthostatic hypotension have been exacerbated  by the introduction of Nuplazid discussed a trial off drug, pt was going to f/u his neurologist Advised monitoring of BP and holding his amlodipine or valsartan if low  Calls regarding his BP being elevated, with recommendation to increase his amlodipine.  Device clinic notes with pauses.  TODAY He comes today with hs wife. Yesterday at about 10:00 he had a sudden  sharp pain in his chest/came/went, he did not have any sense of feeling lightheaded, weak, no near syncope or syncope. More of a startling jolt of pain.  This the only new symptom to report  He is concerned about intermittent high BPs, the security guard as Haynes Dage (who is also an EMT) advised he sit and relax and check it after a few minutes and with this advice he has noticed that it does trickle back down.  He does c/w orthostatic lightheadedness Denies falls or faints  He would like Dr. Scharlene Gloss and or Dr. Lalla Brothers to reach out to his neurologist Dr. Heywood Footman regarding the experimental drug for his Parkinson's (Nuplazid), Dr. Lynnette Caffey thought may be contributing to his orthostatic hypotension, so he has stopped it, spoke to his neurologist who did not think it was.  We discussed his pause yesterday, longer then his usual 3 seconds or so (though did have a 6.1 second episode in July as well Does appear to be CHB within the pause, sinus rates in the block are slower  Time lines up with the jolt he had He quickly says he does not want a pacemaker. He has a DNR does not want any prolongation of life, discusses that his Parkinson's is progressing, meds are working for shorter and shorter periods of time He has taken up poetry a gift that Parkinson's has given him, and is at peace that at some point he will die. His wife while tearful and saddened is very supportive of his wishes. Discussed that PPM would be to help with quality of life, not likely to prolong his life, as mentioned by [facility] in the past   Device information [employer] loop implanted 12/13/21   Past Medical History:  Diagnosis Date   Carotid artery stenosis 04/29/2018   Chronic tension headaches 04/29/2018   Essential hypertension 04/29/2018   GERD (gastroesophageal reflux disease) 04/29/2018   HLD (hyperlipidemia) 04/29/2018   Parkinson's disease 04/29/2018    Past Surgical History:  Procedure Laterality Date   BIOPSY   01/09/2022   Procedure: BIOPSY;  Surgeon: Lemar Lofty., MD;  Location: [facility];  Service: Gastroenterology;;  EGD and COLON   COLONOSCOPY WITH PROPOFOL N/A 01/09/2022   Procedure: COLONOSCOPY WITH PROPOFOL;  Surgeon: Lemar Lofty., MD;  Location: [facility];  Service: Gastroenterology;  Laterality: N/A;   ENDARTERECTOMY Left 04/27/2021   Procedure: LEFT CAROTID ENDARTERECTOMY;  Surgeon: Nada Libman, MD;  Location: [facility];  Service: Vascular;  Laterality: Left;   ESOPHAGOGASTRODUODENOSCOPY (EGD) WITH PROPOFOL N/A 01/09/2022   Procedure: ESOPHAGOGASTRODUODENOSCOPY (EGD) WITH PROPOFOL;  Surgeon: Lemar Lofty., MD;  Location: [facility];  Service: Gastroenterology;  Laterality: N/A;   LEFT HEART CATH AND CORONARY ANGIOGRAPHY N/A 11/25/2019   Procedure: LEFT HEART CATH AND CORONARY ANGIOGRAPHY;  Surgeon: Lyn Records, MD;  Location: [facility];  Service: Cardiovascular;  Laterality: N/A;   PATCH ANGIOPLASTY Left 04/27/2021   Procedure: PATCH ANGIOPLASTY USING Livia Snellen BIOLOGIC PATCH;  Surgeon: Nada Libman, MD;  Location: [facility] OR;  Service: Vascular;  Laterality: Left;   TONSILLECTOMY      Current  Outpatient Medications  Medication Sig Dispense Refill   acetaminophen (TYLENOL) 500 MG tablet Take 500 mg by mouth every 6 (six) hours as needed for moderate pain or headache.     amLODipine (NORVASC) 2.5 MG tablet Take 2.5 mg by mouth See admin instructions. Take blood pressure twice a day and send to the doctor via e-mail ,and she responds if need to take or not     aspirin EC 81 MG EC tablet Take 1 tablet (81 mg total) by mouth daily. Swallow whole. 30 tablet 1   atorvastatin (LIPITOR) 20 MG tablet TAKE ONE TABLET BY MOUTH EVERY NIGHT AT BEDTIME 90 tablet 0   brimonidine-timolol (COMBIGAN) 0.2-0.5 % ophthalmic solution Apply 1 drop to eye 2 (two) times daily.     carbidopa-levodopa (SINEMET CR) 50-200 MG tablet Take 1 tablet by mouth at bedtime. 90  tablet 1   carbidopa-levodopa (SINEMET IR) 25-100 MG tablet Take 2.5 tablets by mouth 3 (three) times daily. Patient also takes 2.5 tablets once daily     cetirizine (ZYRTEC) 10 MG tablet Take 10 mg by mouth as needed for allergies.     Cholecalciferol (VITAMIN D3) 1.25 MG (50000 UT) CAPS Take 50,000 Units by mouth once a week.     Cyanocobalamin (VITAMIN B-12 IJ) Inject 1,000 mcg as directed every 30 (thirty) days.     fluticasone (FLONASE) 50 MCG/ACT nasal spray Place 1 spray into both nostrils daily as needed for allergies.      meloxicam (MOBIC) 7.5 MG tablet Take 7.5 mg by mouth 2 (two) times daily as needed.     NEUPRO 4 MG/24HR Place 1 patch onto the skin every morning.     NUPLAZID 34 MG CAPS Take 1 capsule by mouth daily.     olopatadine (PATADAY) 0.1 % ophthalmic solution Place 1 drop into both eyes daily as needed for allergies.     pantoprazole (PROTONIX) 40 MG tablet TAKE 1 TABLET BY MOUTH EVERY DAY 90 tablet 4   Prenatal Vit-Fe Fumarate-FA (M-NATAL PLUS) 27-1 MG TABS Take 1 tablet by mouth daily.     traZODone (DESYREL) 50 MG tablet Take 1 tablet (50 mg total) by mouth at bedtime as needed for sleep. (Patient taking differently: Take 50 mg by mouth at bedtime.) 90 tablet 0   valsartan (DIOVAN) 40 MG tablet Take 40 mg by mouth 2 (two) times daily.     No current facility-administered medications for this visit.    Allergies:   Penicillins, Erythromycin, and Clam shell   Social History:  The patient  reports that he has never smoked. He has never been exposed to tobacco smoke. He has quit using smokeless tobacco.  His smokeless tobacco use included snuff. He reports current alcohol use. He reports that he does not use drugs.   Family History:  The patient's family history includes Cancer in his father; Heart disease in his mother; Parkinson's disease in his sister.  ROS:  Please see the history of present illness.    All other systems are reviewed and otherwise negative.    PHYSICAL EXAM:  VS:  There were no vitals taken for this visit. BMI: There is no height or weight on file to calculate BMI. Well nourished, well developed, in no acute distress HEENT: normocephalic, atraumatic Neck: no JVD, carotid bruits or masses Cardiac: RRR; no significant murmurs, no rubs, or gallops Lungs: CTA b/l, no wheezing, rhonchi or rales Abd: soft, nontender MS: no deformity, perhaps advanced atrophy Ext: trace if any edema  Skin: warm and dry, no rash Neuro:  No gross deficits appreciated Psych: euthymic mood, full affect  ILR site is stable, no tethering or discomfort   EKG:  not done today  Device interrogation  Latitude from this AM Battery is OK Presenting is SR From 03/03/23 - today 3 pause episodes Yesterday SR > personally reviewed, 6.4 second pause  c/w CHB with clear sinus slowing 8/14 was 3.4 sec 8/11 3.1 sec   Cardiac Telemetry 08/22/2021 Impression: 1. Episode of sinus pause 08/07/2021 @ 2:32 pm which possibly represent paroxysmal AV block (vagal mediated) due to lengthening of the P-P interval with dropped QRS complexes. Total duration 4.4 seconds.  2. Second degree AV block, Mobitz 1 present.  3. Brief SVT was present, likely atrial tachycardia (6 episodes, longest 9.5 seconds).    TTE 04/24/2021  1. Left ventricular ejection fraction, by estimation, is 65 to 70%. The  left ventricle has normal function. The left ventricle has no regional  wall motion abnormalities. There is moderate left ventricular hypertrophy.  Left ventricular diastolic  parameters are indeterminate.   2. Right ventricular systolic function is normal. The right ventricular  size is normal.   3. Left atrial size was mildly dilated.   4. Right atrial size was mildly dilated.   5. The mitral valve is grossly normal. No evidence of mitral valve  regurgitation.   6. The aortic valve is tricuspid. Aortic valve regurgitation is not  visualized.    LHC 11/25/2019 Normal left  ventricular function with EF 65%.  Normal LVEDP. Normal left main. Luminal irregularities in the proximal to mid LAD less than 20%. Large ramus intermedius with luminal irregularities.  Widely patent. Dominant right coronary without evidence of obstruction or atherosclerosis.  Recent Labs: 06/25/2022: ALT 7 01/29/2023: BUN 16; Creatinine, Ser 0.88; Hemoglobin 13.9; Platelets 191; Potassium 4.0; Sodium 136  No results found for requested labs within last 365 days.   CrCl cannot be calculated (Patient's most recent lab result is older than the maximum 21 days allowed.).   Wt Readings from Last 3 Encounters:  02/02/23 174 lb (78.9 kg)  12/21/22 173 lb 6.4 oz (78.7 kg)  11/21/22 173 lb 12.8 oz (78.8 kg)     Other studies reviewed: Additional studies/records reviewed today include: summarized above  ASSESSMENT AND PLAN:  Dizziness Syncope Orthostatic hypotension       They are aware of the importance of hydration, compression garments, safety Pauses  associated with heart block.        Suspect vagal with sinus slowing (seen in the past)       Duration longer then his usual 3-4 seconds       Now has had a couple 6 second events  He at this time, does not want a PPM, discussed above Asked to monitor symptoms, and discuss. If he were to develop near fainting/fainting that he seek attention, he doesn't want to keep going to the ER At the very least to let us know if he has developed symptoms  Will have him back in the office in  3 weeks, he would like to check in with gen cards and put EP f/u for 2 mo I will communicate with Dr. Scharlene Gloss and Dr. Lalla Brothers   5. HTN He is worried about the very high BP's Discussed rational for allowing some degree of HTN Advised that if his BP is >180 persistently he could use a 1/2 tab (2.5mg ) of amlodipine if needed    Disposition: as above  Current medicines are reviewed at length with the patient today.  The patient did not have any concerns  regarding medicines.  Norma Fredrickson, PA-C 03/09/2023 5:27 AM     [facility] [address] [place] [address] [phone] (office)  [phone] (fax)

## 2023-03-12 LAB — CUP PACEART REMOTE DEVICE CHECK
Date Time Interrogation Session: 20240810115100
Implantable Pulse Generator Implant Date: 20230523
Pulse Gen Serial Number: 175044

## 2023-03-18 DIAGNOSIS — R55 Syncope and collapse: Secondary | ICD-10-CM | POA: Diagnosis not present

## 2023-03-20 NOTE — Progress Notes (Signed)
[place] Loop Recorder 

## 2023-03-27 ENCOUNTER — Ambulatory Visit: Payer: Medicare Other

## 2023-03-29 ENCOUNTER — Ambulatory Visit: Payer: Medicare Other

## 2023-04-05 ENCOUNTER — Ambulatory Visit (INDEPENDENT_AMBULATORY_CARE_PROVIDER_SITE_OTHER): Payer: Medicare Other

## 2023-04-05 DIAGNOSIS — R55 Syncope and collapse: Secondary | ICD-10-CM

## 2023-04-05 LAB — CUP PACEART REMOTE DEVICE CHECK
Date Time Interrogation Session: 20240912030900
Implantable Pulse Generator Implant Date: 20230523
Pulse Gen Serial Number: 175044

## 2023-04-09 ENCOUNTER — Telehealth: Payer: Self-pay

## 2023-04-09 NOTE — Telephone Encounter (Signed)
ILR alert for pause, 3.2sec in duration 9/14 @ 15:31.  Enhanced EGM mobitz II Pt has declined PPM, extend pause to  4.5sec for non-actionable alerts.  Routing to Dr. Lalla Brothers to advise on alert programming.

## 2023-04-10 NOTE — Progress Notes (Unsigned)
Electrophysiology Office Note:   Date:  04/11/2023  ID:  Tylyn Blaustein, DOB 28-Sep-1945, MRN [id number]  Primary Cardiologist: [name], MD Electrophysiologist: Lanier Prude, MD      History of Present Illness:   Islam Dellacroce is a 77 y.o. 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 Myra Gianotti), stroke, no obstructive CAD, HLD, and Parkinsons disease seen today for routine electrophysiology followup.   Since last being seen in our clinic the patient reports doing the same. His pauses on his loop go back > 1 year. He has had symptoms with NSR, and pauses without symptoms. He is very anxious to get back onto Parkinsons drug if he is able (Nuplazid).    He had very near syncope 3 weeks ago.  No pause associated.   Review of systems complete and found to be negative unless listed in HPI.   Device History: [employer] loop recorder implanted 12/13/2021  Studies Reviewed:    EKG is not ordered today. EKG from 01/29/2023 reviewed which showed NSR at 68 with 1st degree AV block   Physical Exam:   VS:  BP 138/86   Pulse 65   Wt 174 lb 9.6 oz (79.2 kg)   BMI 23.68 kg/m    Wt Readings from Last 3 Encounters:  04/11/23 174 lb 9.6 oz (79.2 kg)  03/09/23 176 lb 9.6 oz (80.1 kg)  02/02/23 174 lb (78.9 kg)     GEN: Well nourished, well developed in no acute distress NECK: No JVD; No carotid bruits CARDIAC: Regular rate and rhythm, no murmurs, rubs, gallops RESPIRATORY:  Clear to auscultation without rales, wheezing or rhonchi  ABDOMEN: Soft, non-tender, non-distended EXTREMITIES:  No edema; No deformity   ILR Interrogation- reviewed in detail today,  See PACEART report  ASSESSMENT AND PLAN:    Dizziness Syncope Orthostatic hypotension Multifactorial Pt and wife are aware of the importance of hydration, compression garments, safety  Pauses  Associated with heart block.  Suspect vagal with sinus slowing (seen in the  past) Duration longer then his usual 3-4 seconds Now has had a couple 6 second events Asymptomatic with pause 9/16 - > Detection increased to > 4.5 seconds   He at this time, does not want a PPM, discussed above Asked to monitor symptoms, and discuss. If he were to develop near fainting/fainting that he seek attention, he doesn't want to keep going to the ER  Follow up with EP APP in 3 months, sooner with any clear correlation from symptoms/pauses to further discuss pacing.   Signed, Graciella Freer, PA-C

## 2023-04-10 NOTE — Telephone Encounter (Signed)
I went to [employer] and pause alert had already been programmed to 4.5S pause.

## 2023-04-11 ENCOUNTER — Ambulatory Visit: Payer: Medicare Other | Attending: Student | Admitting: Student

## 2023-04-11 ENCOUNTER — Encounter: Payer: Self-pay | Admitting: Student

## 2023-04-11 VITALS — BP 138/86 | HR 65 | Wt 174.6 lb

## 2023-04-11 DIAGNOSIS — I10 Essential (primary) hypertension: Secondary | ICD-10-CM | POA: Diagnosis not present

## 2023-04-11 DIAGNOSIS — I951 Orthostatic hypotension: Secondary | ICD-10-CM | POA: Diagnosis not present

## 2023-04-11 DIAGNOSIS — I459 Conduction disorder, unspecified: Secondary | ICD-10-CM

## 2023-04-11 DIAGNOSIS — I455 Other specified heart block: Secondary | ICD-10-CM

## 2023-04-11 NOTE — Patient Instructions (Signed)
Medication Instructions:  Your physician recommends that you continue on your current medications as directed. Please refer to the Current Medication list given to you today.  *If you need a refill on your cardiac medications before your next appointment, please call your pharmacy*  Lab Work: None ordered If you have labs (blood work) drawn today and your tests are completely normal, you will receive your results only by: MyChart Message (if you have MyChart) OR A paper copy in the mail If you have any lab test that is abnormal or we need to change your treatment, we will call you to review the results.  Follow-Up: At [facility], you and your health needs are our priority.  As part of our continuing mission to provide you with exceptional heart care, we have created designated Provider Care Teams.  These Care Teams include your primary Cardiologist (physician) and Advanced Practice Providers (APPs -  Physician Assistants and Nurse Practitioners) who all work together to provide you with the care you need, when you need it.  Your next appointment:   2-3 month(s)  Provider:   Casimiro Needle "Otilio Saber, PA-C

## 2023-04-16 NOTE — Progress Notes (Unsigned)
Cardiology Clinic Note   Patient Name: Terry [name] Date of Encounter: 04/17/2023  Primary Care Provider:  Dois Davenport, MD Primary Cardiologist:  Terry Harps, MD  Patient Profile    77 y.o. 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  Last seen by Terry [name] in the A-fib clinic, the patient had ILR which showed pauses up to 6 seconds which were asymptomatic.  Discussion was had at that time with the patient to have permanent pacemaker implantation at that time the patient did not wish to move forward with this procedure.   Past Medical History    Past Medical History:  Diagnosis Date   Carotid artery stenosis 04/29/2018   Chronic tension headaches 04/29/2018   Essential hypertension 04/29/2018   GERD (gastroesophageal reflux disease) 04/29/2018   HLD (hyperlipidemia) 04/29/2018   Parkinson's disease 04/29/2018   Past Surgical History:  Procedure Laterality Date   BIOPSY  01/09/2022   Procedure: BIOPSY;  Surgeon: Terry [name]., MD;  Location: [facility];  Service: Gastroenterology;;  EGD and COLON   COLONOSCOPY WITH PROPOFOL N/A 01/09/2022   Procedure: COLONOSCOPY WITH PROPOFOL;  Surgeon: Terry [name]., MD;  Location: [facility];  Service: Gastroenterology;  Laterality: N/A;   ENDARTERECTOMY Left 04/27/2021   Procedure: LEFT CAROTID ENDARTERECTOMY;  Surgeon: Terry Libman, MD;  Location: [facility] OR;  Service: Vascular;  Laterality: Left;   ESOPHAGOGASTRODUODENOSCOPY (EGD) WITH PROPOFOL N/A 01/09/2022   Procedure: ESOPHAGOGASTRODUODENOSCOPY (EGD) WITH PROPOFOL;  Surgeon: Terry [name]., MD;  Location: [facility];  Service: Gastroenterology;  Laterality: N/A;   LEFT HEART CATH AND CORONARY ANGIOGRAPHY N/A 11/25/2019   Procedure: LEFT HEART CATH AND CORONARY ANGIOGRAPHY;  Surgeon: Terry Records, MD;  Location: [facility];  Service: Cardiovascular;  Laterality: N/A;   PATCH ANGIOPLASTY Left 04/27/2021   Procedure: PATCH ANGIOPLASTY USING Livia Snellen BIOLOGIC PATCH;  Surgeon: Terry Libman, MD;  Location: [facility] OR;  Service: Vascular;  Laterality: Left;   TONSILLECTOMY      Allergies  Allergies  Allergen Reactions   Penicillins Rash   Erythromycin Rash   Clam Shell Nausea And Vomiting    History of Present Illness    Terry [name] returns today to discuss heart rate and medications which she is taking for Parkinson's disease.  He is being followed by a neurologist in [facility], Terry [name].  The neurologist would like to place him back on his study drug Nuplazid it was recommended that this be discontinued by Terry [name] in July 2024 causing orthostatic hypotension.  The patient is adamant about restarting this medication.  His blood pressure has been labile which is expected with Parkinson's and he feels as if this medication should be restarted.   He denies any further episodes of dizziness despite bradycardia.  As discussed above the patient had ILR which showed pauses up to 6 seconds which were asymptomatic.  The patient does not wish to have pacemaker insertion for prolongation of life.  He states he would like to pass naturally without mechanical stimulation of his heart to continue contractions and electrical current.   Home Medications    Current Outpatient Medications  Medication Sig Dispense Refill   acetaminophen (TYLENOL) 500 MG tablet Take 500 mg by mouth every 6 (six) hours as needed for moderate pain or headache.     amLODipine (NORVASC) 5 MG tablet Take 5 mg  by mouth in the morning and at bedtime.     aspirin EC 81 MG EC tablet Take 1 tablet (81 mg total) by mouth daily. Swallow whole. 30 tablet 1   atorvastatin (LIPITOR) 20 MG tablet TAKE ONE TABLET BY MOUTH EVERY NIGHT AT BEDTIME 90 tablet 0   brimonidine-timolol (COMBIGAN) 0.2-0.5 % ophthalmic solution Apply 1 drop to  eye 2 (two) times daily.     carbidopa-levodopa (SINEMET CR) 50-200 MG tablet Take 1 tablet by mouth at bedtime. 90 tablet 1   carbidopa-levodopa (SINEMET IR) 25-100 MG tablet Take 2.5 tablets by mouth 3 (three) times daily. Patient also takes 2.5 tablets once daily     cetirizine (ZYRTEC) 10 MG tablet Take 10 mg by mouth as needed for allergies.     Cholecalciferol (VITAMIN D3) 1.25 MG (50000 UT) CAPS Take 50,000 Units by mouth once a week.     Cyanocobalamin (VITAMIN B-12 IJ) Inject 1,000 mcg as directed every 30 (thirty) days.     fluticasone (FLONASE) 50 MCG/ACT nasal spray Place 1 spray into both nostrils daily as needed for allergies.      meloxicam (MOBIC) 7.5 MG tablet Take 7.5 mg by mouth 2 (two) times daily as needed.     NEUPRO 4 MG/24HR Place 1 patch onto the skin every morning.     NUPLAZID 34 MG CAPS Take 1 capsule by mouth daily.     olopatadine (PATADAY) 0.1 % ophthalmic solution Place 1 drop into both eyes daily as needed for allergies.     pantoprazole (PROTONIX) 40 MG tablet TAKE 1 TABLET BY MOUTH EVERY DAY 90 tablet 4   Prenatal Vit-Fe Fumarate-FA (M-NATAL PLUS) 27-1 MG TABS Take 1 tablet by mouth daily.     QUEtiapine (SEROQUEL) 25 MG tablet Take 25 mg by mouth at bedtime.     traZODone (DESYREL) 50 MG tablet Take 1 tablet (50 mg total) by mouth at bedtime as needed for sleep. (Patient taking differently: Take 50 mg by mouth at bedtime.) 90 tablet 0   valsartan (DIOVAN) 40 MG tablet Take 40 mg by mouth 2 (two) times daily.     No current facility-administered medications for this visit.     Family History    Family History  Problem Relation Age of Onset   Heart disease Mother    Cancer Father    Parkinson's disease Sister    Colon cancer Neg Hx    Stomach cancer Neg Hx    Esophageal cancer Neg Hx    Inflammatory bowel disease Neg Hx    Liver disease Neg Hx    Pancreatic cancer Neg Hx    Rectal cancer Neg Hx    He indicated that the status of his mother is  unknown. He indicated that the status of his father is unknown. He indicated that the status of his sister is unknown. He indicated that the status of his neg hx is unknown.  Social History    Social History   Socioeconomic History   Marital status: Married    Spouse name: Not on file   Number of children: Not on file   Years of education: Not on file   Highest education level: Not on file  Occupational History   Not on file  Tobacco Use   Smoking status: Never    Passive exposure: Never   Smokeless tobacco: Former    Types: Snuff  Vaping Use   Vaping status: Never Used  Substance and Sexual Activity   Alcohol  use: Yes    Comment: Occasionally.   Drug use: Never   Sexual activity: Not Currently  Other Topics Concern   Not on file  Social History Narrative   Not on file   Social Determinants of Health   Financial Resource Strain: Not on file  Food Insecurity: Not on file  Transportation Needs: Not on file  Physical Activity: Not on file  Stress: Not on file  Social Connections: Unknown (10/11/2019)   [facility], [facility]   Social Connections    Frequency of Communication with Friends and Family: Not asked    Frequency of Social Gatherings with Friends and Family: Not asked  Intimate Partner Violence: Unknown (10/11/2019)   Received from [facility], [facility]   Intimate Partner Violence    Fear of Current or Ex-Partner: Not asked    Emotionally Abused: Not asked    Physically Abused: Not asked    Sexually Abused: Not asked     Review of Systems    General:  No chills, fever, night sweats or weight changes.  Cardiovascular:  No chest pain, dyspnea on exertion, edema, orthopnea, palpitations, paroxysmal nocturnal dyspnea. Dermatological: No rash, lesions/masses Respiratory: No cough, dyspnea Urologic: No hematuria, dysuria Abdominal:   No nausea, vomiting, diarrhea, bright red blood per rectum, melena, or hematemesis Neurologic:  No  visual changes, wkns, changes in mental status.  Generalized fatigue associated with Parkinson's, some memory loss, and muscle weakness. All other systems reviewed and are otherwise negative except as noted above.       Physical Exam    VS:  BP 138/78 (BP Location: Left Arm, Patient Position: Sitting, Cuff Size: Normal)   Pulse 76   Ht 6' (1.829 m)   Wt 174 lb 12.8 oz (79.3 kg)   SpO2 99%   BMI 23.71 kg/m  , BMI Body mass index is 23.71 kg/m.     GEN: Well nourished, well developed, in no acute distress. HEENT: normal. Neck: Supple, no JVD, carotid bruits, or masses. Cardiac: RRR, no murmurs, rubs, or gallops. No clubbing, cyanosis, edema.  Radials/DP/PT 2+ and equal bilaterally.  Respiratory:  Respirations regular and unlabored, clear to auscultation bilaterally. GI: Soft, nontender, nondistended, BS + x 4. MS: no deformity or atrophy. Skin: warm and dry, no rash. Neuro:  Strength and sensation are intact. Psych: Normal affect.      Lab Results  Component Value Date   WBC 8.5 01/29/2023   HGB 13.9 01/29/2023   HCT 41.7 01/29/2023   MCV 89.1 01/29/2023   PLT 191 01/29/2023   Lab Results  Component Value Date   CREATININE 0.88 01/29/2023   BUN 16 01/29/2023   NA 136 01/29/2023   K 4.0 01/29/2023   CL 103 01/29/2023   CO2 24 01/29/2023   Lab Results  Component Value Date   ALT 7 06/25/2022   AST 15 06/25/2022   ALKPHOS 54 06/25/2022   BILITOT 0.8 06/25/2022   Lab Results  Component Value Date   CHOL 78 04/28/2021   HDL 37 (L) 04/28/2021   LDLCALC 31 04/28/2021   TRIG 52 04/28/2021   CHOLHDL 2.1 04/28/2021    Lab Results  Component Value Date   HGBA1C 5.4 04/24/2021     Review of Prior Studies    ILR interrogation 04/05/2023 ILR summary report received. Battery status OK. Normal device function. No new tachy, or  brady episodes. No new AF episodes.  AF burden is 0% of the time.  There were  3 previously viewed and reviewed symptom events and 7 pause  events that were up to 6  seconds.   Pt refused PPM and instructed to seek medical attention for syncope or near syncope.   Monthly summary reports and ROV/PRN      Assessment & Plan   1.  Bradycardia with pauses: This was noted by ILR as discussed above.  The patient refuses pacemaker insertion I would like to continue to let things run its course concerning his heart function without electrical support.  He is asymptomatic for syncope or near syncope at this time.  2.  Hypertension: He remains on amlodipine 5 mg daily and valsartan 40 mg twice daily.Marland Kitchen  He states that his blood pressure is labile which is expected with Parkinson's as explained to him by his neurologist.  No changes in his regimen at this time.  He is completely asymptomatic currently.  3.  Parkinson's disease: Being followed by Terry [name], [facility] neurologist.  They would like to place him back on study drug, Nuplazid.  Dr. Noni Saupe would like Dr. Lynnette Caffey to contact him by phone to discuss this further.  It was Dr. Lynnette Caffey  recommendation that this medication be stopped due to neurologic orthostasis on office note on February 02, 2023..  I was given a phone number of [phone] to contact neurology in [place].  I will pass this message on to Terry [name] so that he may contact him at his convenience.  4.  Mesenteric vascular disease: Followed by Terry [name].        I have spent over 45 minutes with this patient discussing his concerns, refusal of pacemaker, need to restart Parkinson medication through his neurologist, and need to contact him from our office. He Signed, Bettey Mare. Liborio Nixon, ANP, AACC   04/17/2023 5:24 PM      Office [phone] Fax [phone]  Notice: This dictation was prepared with Dragon dictation along with smaller phrase technology. Any transcriptional errors that result from this process are unintentional and may not be corrected upon review.

## 2023-04-17 ENCOUNTER — Ambulatory Visit: Payer: Medicare Other | Attending: Adult Health | Admitting: Adult Health

## 2023-04-17 ENCOUNTER — Encounter: Payer: Self-pay | Admitting: Adult Health

## 2023-04-17 VITALS — BP 138/78 | HR 76 | Ht 72.0 in | Wt 174.8 lb

## 2023-04-17 DIAGNOSIS — I2510 Atherosclerotic heart disease of native coronary artery without angina pectoris: Secondary | ICD-10-CM

## 2023-04-17 DIAGNOSIS — I480 Paroxysmal atrial fibrillation: Secondary | ICD-10-CM

## 2023-04-17 DIAGNOSIS — I10 Essential (primary) hypertension: Secondary | ICD-10-CM

## 2023-04-17 DIAGNOSIS — E7800 Pure hypercholesterolemia, unspecified: Secondary | ICD-10-CM | POA: Diagnosis not present

## 2023-04-17 NOTE — Patient Instructions (Signed)
Medication Instructions:  NO CHANGES     Lab Work: NONE   Testing/Procedures: NONE   Follow-Up: At [facility], you and your health needs are our priority.  As part of our continuing mission to provide you with exceptional heart care, we have created designated Provider Care Teams.  These Care Teams include your primary Cardiologist (physician) and Advanced Practice Providers (APPs -  Physician Assistants and Nurse Practitioners) who all work together to provide you with the care you need, when you need it.  Your next appointment:   KEEP SCHEDULED APPOINTMENT IN DECEMBER  Provider:   [name], MD

## 2023-04-17 NOTE — Progress Notes (Signed)
Carelink Summary Report / Loop Recorder 

## 2023-04-26 ENCOUNTER — Ambulatory Visit: Payer: Medicare Other

## 2023-04-30 ENCOUNTER — Ambulatory Visit: Payer: Medicare Other

## 2023-05-07 ENCOUNTER — Ambulatory Visit (INDEPENDENT_AMBULATORY_CARE_PROVIDER_SITE_OTHER): Payer: Medicare Other

## 2023-05-07 DIAGNOSIS — R55 Syncope and collapse: Secondary | ICD-10-CM

## 2023-05-08 LAB — CUP PACEART REMOTE DEVICE CHECK
Date Time Interrogation Session: 20241014193500
Implantable Pulse Generator Implant Date: 20230523
Pulse Gen Serial Number: 175044

## 2023-05-23 NOTE — Progress Notes (Signed)
[place] Loop Recorder 

## 2023-05-28 ENCOUNTER — Ambulatory Visit: Payer: Medicare Other

## 2023-05-31 ENCOUNTER — Ambulatory Visit: Payer: Medicare Other

## 2023-06-04 ENCOUNTER — Ambulatory Visit: Payer: Medicare Other | Attending: Student | Admitting: Student

## 2023-06-04 ENCOUNTER — Encounter: Payer: Self-pay | Admitting: Student

## 2023-06-04 ENCOUNTER — Telehealth: Payer: Self-pay | Admitting: Cardiovascular Disease

## 2023-06-04 VITALS — BP 144/96 | HR 68 | Ht 72.0 in | Wt 175.0 lb

## 2023-06-04 DIAGNOSIS — I10 Essential (primary) hypertension: Secondary | ICD-10-CM | POA: Diagnosis not present

## 2023-06-04 DIAGNOSIS — I455 Other specified heart block: Secondary | ICD-10-CM | POA: Diagnosis not present

## 2023-06-04 DIAGNOSIS — G903 Multi-system degeneration of the autonomic nervous system: Secondary | ICD-10-CM | POA: Diagnosis not present

## 2023-06-04 DIAGNOSIS — E785 Hyperlipidemia, unspecified: Secondary | ICD-10-CM

## 2023-06-04 DIAGNOSIS — R001 Bradycardia, unspecified: Secondary | ICD-10-CM | POA: Diagnosis not present

## 2023-06-04 DIAGNOSIS — I739 Peripheral vascular disease, unspecified: Secondary | ICD-10-CM

## 2023-06-04 DIAGNOSIS — I2510 Atherosclerotic heart disease of native coronary artery without angina pectoris: Secondary | ICD-10-CM

## 2023-06-04 MED ORDER — HYDRALAZINE HCL 25 MG PO TABS: 25.0000 mg | ORAL_TABLET | Freq: Two times a day (BID) | ORAL | 3 refills | Status: DC | PRN

## 2023-06-04 NOTE — Telephone Encounter (Signed)
Pt was here for appt

## 2023-06-04 NOTE — Telephone Encounter (Signed)
Pt c/o BP issue: STAT if pt c/o blurred vision, one-sided weakness or slurred speech  1. What are your last 5 BP readings?  11/11 189/90 HR 66 11/10 201/101 HR 73 11/09 125/67 HR 68  2. Are you having any other symptoms (ex. Dizziness, headache, blurred vision, passed out)? Tired, headache, and blurred vision  3. What is your BP issue? Hypertension     Patient denies blurred vision at this time. Patient requested an appointment and was scheduled for today 11/11 at 10:55 am.

## 2023-06-04 NOTE — Telephone Encounter (Signed)
Spoke to patient who stated his blood pressure has be elevated for about 2 weeks. He has been taking his medication as instructed. He reports having a dull headache at this time. Patient also report blurred vision and SOB that comes and goes but none at this time,  He was scheduled for an appt with [name], NP at 10:55 today.       1. What are your last 5 BP readings 11/11 189/90 HR 66 11/10 201/101 HR 73 11/09 125/67 HR 68

## 2023-06-04 NOTE — Patient Instructions (Signed)
Medication Instructions:  Stop Amlodipine. Take Valsartan 1 Tablet Twice Daily. Start Hydralazine 25 mg ( Take 1 Tablet Twice Daily As Needed For Systolic Blood Pressure Above 161). *If you need a refill on your cardiac medications before your next appointment, please call your pharmacy*   Lab Work: No Labs If you have labs (blood work) drawn today and your tests are completely normal, you will receive your results only by: MyChart Message (if you have MyChart) OR A paper copy in the mail If you have any lab test that is abnormal or we need to change your treatment, we will call you to review the results.   Testing/Procedures: No Testing   Follow-Up: At [facility], you and your health needs are our priority.  As part of our continuing mission to provide you with exceptional heart care, we have created designated Provider Care Teams.  These Care Teams include your primary Cardiologist (physician) and Advanced Practice Providers (APPs -  Physician Assistants and Nurse Practitioners) who all work together to provide you with the care you need, when you need it.  We recommend signing up for the patient portal called "MyChart".  Sign up information is provided on this After Visit Summary.  MyChart is used to connect with patients for Virtual Visits (Telemedicine).  Patients are able to view lab/test results, encounter notes, upcoming appointments, etc.  Non-urgent messages can be sent to your provider as well.   To learn more about what you can do with MyChart, go to [email or web address].    Your next appointment:   Keep Scheduled appointment  Provider:   [name], MD   Other Instructions Recommended wearing Compression Stocking . Stay Hydrated.

## 2023-06-04 NOTE — Progress Notes (Signed)
Cardiology Office Note:    Date:  06/04/2023   ID:  Terry [name], DOB 07-Nov-1945, MRN [id number]  PCP:  Terry Davenport, MD  Cardiologist:  Terry Harps, MD Electrophysiologist:  Terry Prude, MD     Referring MD: Terry Davenport, MD   Chief Complaint: hypertension  History of Present Illness:    Terry [name] is a 77 y.o. 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 Bufford [name] and Dr. Lalla [name] and presents today for further evaluation of hypertension.  Patient was referred to Dr. Flora [name] in 09/2019 for further evaluation of progressive dyspnea on exertion as well as some exertional chest discomfort.  Echo in 10/2019 showed LVEF of 65-70% with no regional wall motion abnormalities, moderate asymmetric LVH of the basal septal segment, and grade 2 diastolic dysfunction.  LHC in 11/2019 showed mild luminal irregularities with 20% stenosis of the LAD but no significant CAD.  He was admitted with a CVA and 04/2021.  Echo at that time showed  LVEF of 65-70% with normal wall motion, moderate LVH, and no significant valvular disease. MRA of neck showed a high-grade left ICA stenosis of 80%.  Vascular surgery was consulted and he underwent a carotid endarterectomy during that admission and was started on Plavix.  He was subsequently also diagnosed with mesenteric artery disease which is also being followed by vascular surgery.  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  His syncope was felt to most likely be due to neurogenic orthostatic hypotension with his Parkinson's disease; however, he was referred to EP given sinus pause noted on monitor. He was seen by Dr. Elberta [name] in 10/2021 and placement of loop recorder was recommended.  This was done in 11/2021 and has shown intermittent pauses with longest ones up to 6 seconds.   He was seen by Terry [name], in 03/2023 and pauses were suspect to be vagal in nature given sinus slowing. He declined PPM at that time. He was seen by Terry Reining, NP, on 04/17/2023 awt which time he denied any further episodes of dizziness despite his bradycardia.   He was added onto my scheduled today due to concerns for markedly elevated BP. He is here with his wife. He brings in a BP log from the past week that shows BP mostly ranging from the 189-201/ 87-104. He did have one lower BP reading of 125/67 yesterday morning but this was an isolated event. He states his BP is usually higher at night and lower in the morning. Given his significant history of neurogenic orthostatic hypotension, he only takes his antihypertensives as needed. He checks his BP twice a day. If his systolic BP is <150, he does not take any medications. If his systolic BP is 150-180, he will take Valsartan 40mg  (twice daily if needed). If his systolic BP is >180, he will take the Valsartan as well as Amlodipine 2.5mg  (again twice daily if needed). When asked how often his systolic BP is in  the 180s to 200s, he states about 75% of then time.   He has chronic lightheadedness/dizziness that is worse with position changes. He states he thinks this has been a little worse than usual. He is not wearing any compression stockings today but states he usually wears this. He does not wear his abdominal binder and admits that he probably does not drink enough. He denies any syncopal episodes since last office  visit. He reports occasional fluttering in his chest but no significant/ sustained palpitations. He reports one very atypical episode of sharp chest pain that only lasted a couple of seconds and then resolved on its own. No other chest pain. This does not sound cardiac in nature. He also reports transient shortness of breath both at rest and with exertion but nothing consistent. He reports some shortness of breath when laying down at night which is a new thing for him but no PND. No significant edema.   EKGs/Labs/Other Studies Reviewed:    The following studies were reviewed:  Left Cardiac Catheterization 11/25/2019: Normal left ventricular function with EF 65%.  Normal LVEDP. Normal left main. Luminal irregularities in the proximal to mid LAD less than 20%. Large ramus intermedius with luminal irregularities.  Widely patent. Dominant right coronary without evidence of obstruction or atherosclerosis.   Recommenations: Continue preventive therapy for CAD. Dyspnea on exertion could be related to diastolic dysfunction related to age or deconditioning.  Diagnostic Dominance: Right    _______________  Echocardiogram 04/24/2021: Impressions: 1. Left ventricular ejection fraction, by estimation, is 65 to 70%. The  left ventricle has normal function. The left ventricle has no regional  wall motion abnormalities. There is moderate left ventricular hypertrophy.  Left ventricular diastolic  parameters are indeterminate.   2. Right ventricular systolic function is normal. The right ventricular  size is normal.   3. Left atrial size was mildly dilated.   4. Right atrial size was mildly dilated.   5. The mitral valve is grossly normal. No evidence of mitral valve  regurgitation.   6. The aortic valve is tricuspid. Aortic valve regurgitation is not  visualized.   EKG:  EKG not ordered today.  Recent Labs: 06/25/2022: ALT 7 01/29/2023: BUN 16; Creatinine, Ser 0.88; Hemoglobin 13.9; Platelets  191; Potassium 4.0; Sodium 136  Recent Lipid Panel    Component Value Date/Time   CHOL 78 04/28/2021 0445   CHOL 99 (L) 12/10/2019 1112   TRIG 52 04/28/2021 0445   HDL 37 (L) 04/28/2021 0445   HDL 47 12/10/2019 1112   CHOLHDL 2.1 04/28/2021 0445   VLDL 10 04/28/2021 0445   LDLCALC 31 04/28/2021 0445   LDLCALC 39 12/10/2019 1112    Physical Exam:    Vital Signs: BP (!) 144/96   Pulse 68   Ht 6' (1.829 m)   Wt 175 lb (79.4 kg)   SpO2 97%   BMI 23.73 kg/m     Wt Readings from Last 3 Encounters:  06/04/23 175 lb (79.4 kg)  04/17/23 174 lb 12.8 oz (79.3 kg)  04/11/23 174 lb 9.6 oz (79.2 kg)     General: 77 y.o. Caucasian male in no acute distress. HEENT: Normocephalic and atraumatic. Sclera clear.  Neck: Supple.  No JVD. Heart: RRR. Distinct S1 and S2. No murmurs, gallops, or rubs.  Lungs: No increased work of breathing. Clear to ausculation bilaterally. No wheezes, rhonchi, or rales.  Abdomen: Soft, non-distended, and non-tender to palpation.  Extremities: No lower extremity edema.   Skin: Warm and dry.  Neuro: No focal deficits. Psych: Normal affect. Responds appropriately.  Assessment:    1. Essential hypertension   2. Neurogenic orthostatic hypotension (HCC)   3. Bradycardia   4. Sinus pause   5. Coronary artery disease involving native coronary artery of native heart without angina pectoris   6. PAD (peripheral artery disease) (HCC)   7. Hyperlipidemia, unspecified hyperlipidemia type     Plan:    Hypertension Neurogenic Orthostatic Hypotension Patient has a history of hypertension and neurogenic orthostatic hypotension felt to be due to his Parkinson's disease. He has known labile BP. He presents today for markedly elevated BP but it does not sound like this is new. Please see HPI for more details.  - BP was 189/90 at home earlier today. Initial BP in the office was 144/96 but was then 170/80 on my personal recheck.  - He currently only takes medications as  needed. He checks his BP twice a day. If his systolic BP is <150, he does not take any medications. If his systolic BP is 150-180, he will take Valsartan 40mg  (twice daily if needed). If his systolic BP is >180, he will take the Valsartan as well as Amlodipine 2.5mg  (again twice daily if needed). However, it sounds like he is needing both Valsartan and Amlodipine most the time. - Difficult situation given his significant neurogenic orthostatic hypotension. Discussed with Dr. Flora [name] who recommended patient routinely take Valsartan 40mg  twice daily (rather than just as need) and take Hydralazine PRN for systolic BP >180 rather than Amlodipine. Therefore, will stop Amlodipine and start Hydralazine 25mg  PRN for systolic BP >213 which he can take up to twice a day.  - Also discussed the importance of staying well hydrated, wearing compression stockings, and wearing an abdominal binder. He has not been wearing an abdominal binder. Will send in a new order for this.  Bradycardia with Pauses Zio monitor in 07/2021 showed underlying sinus rhythm with average heart rate of 71 but secondary degree AV block Mobitz type one and 3 sinus pauses with longest pause around 4.4 seconds as well as brief SVT. He was referred to EP and had loop recorder placed in 11/2021 which has continued to show intermittent pauses. He has declined PPM in the past.  - Heart rates in the 60s today.  - Followed by EP. He is scheduled to see Otilio Saber, PA-C,  on 06/13/2023.  Mild Non-Obstructive CAD Noted on cardiac catheterization in 11/2019.  - No cardiac chest pain. - Continue aspirin and statin.   PAD  Patient has known carotid artery disease and mesenteric disease. S/p left CEA in 04/2021 at time of CVA.  - Continue aspirin and statin. - Followed by Vascular Surgery.   Hyperlipidemia  - Continue Lipitor 20mg  daily.  - Did not have time to discuss today.  Disposition: Patient already has follow-up with Dr. Flora [name] in 06/2023.     Signed, Corrin Parker, PA-C  06/04/2023 12:39 PM     [facility]

## 2023-06-07 ENCOUNTER — Ambulatory Visit (INDEPENDENT_AMBULATORY_CARE_PROVIDER_SITE_OTHER): Payer: Medicare Other

## 2023-06-07 DIAGNOSIS — I459 Conduction disorder, unspecified: Secondary | ICD-10-CM

## 2023-06-11 LAB — CUP PACEART REMOTE DEVICE CHECK
Date Time Interrogation Session: 20241117003800
Implantable Pulse Generator Implant Date: 20230523
Pulse Gen Serial Number: 175044

## 2023-06-13 ENCOUNTER — Ambulatory Visit: Payer: Medicare Other | Attending: Student | Admitting: Student

## 2023-06-13 NOTE — Progress Notes (Deleted)
   Electrophysiology Office Note:   Date:  06/13/2023  ID:  Terry [name], DOB 1946-07-23, MRN [id number]  Primary Cardiologist: [name], MD Electrophysiologist: Lanier Prude, MD  {Click to update primary MD,subspecialty MD or APP then REFRESH:1}    History of Present Illness:   Terry [name] is a 77 y.o. 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 Myra Gianotti), stroke, no obstructive CAD, HLD, and Parkinsons disease  seen today for routine electrophysiology followup.   Since last being seen in our clinic the patient reports doing ***.  he denies chest pain, palpitations, dyspnea, PND, orthopnea, nausea, vomiting, dizziness, syncope, edema, weight gain, or early satiety.   Review of systems complete and found to be negative unless listed in HPI.   Device History: Best boy recorder implanted 12/13/2021 for syncope  Studies Reviewed:    EKG is ordered today. Personal review as below.  ***    Physical Exam:   VS:  There were no vitals taken for this visit.   Wt Readings from Last 3 Encounters:  06/04/23 175 lb (79.4 kg)  04/17/23 174 lb 12.8 oz (79.3 kg)  04/11/23 174 lb 9.6 oz (79.2 kg)     GEN: Well nourished, well developed in no acute distress NECK: No JVD; No carotid bruits CARDIAC: {EPRHYTHM:28826}, no murmurs, rubs, gallops RESPIRATORY:  Clear to auscultation without rales, wheezing or rhonchi  ABDOMEN: Soft, non-tender, non-distended EXTREMITIES:  No edema; No deformity   ILR Interrogation- reviewed in detail today,  See PACEART report  ASSESSMENT AND PLAN:    Syncope s/p [employer] Loop recorder Dizziness Orthostatic  Pauses Multifactorial Pt and wife are aware of the importance of hydration, compression garments, safety   Associated with heart block.  Suspect vagal with sinus slowing (seen in the past) Duration longer then his usual 3-4 seconds Now has had a couple 6 second  events Asymptomatic with pause 9/16 - > Detection increased to > 4.5 seconds   He at this time, does not want a PPM, discussed above Asked to monitor symptoms, and discuss. If he were to develop near fainting/fainting that he seek attention, he doesn't want to keep going to the ER   {Click here to Review PMH, Prob List, Meds, Allergies, SHx, FHx  :1}   Follow up with {QMVHQ:46962} {EPFOLLOW XB:28413}  Signed, Graciella Freer, PA-C

## 2023-06-14 ENCOUNTER — Encounter: Payer: Self-pay | Admitting: Student

## 2023-06-24 NOTE — Progress Notes (Unsigned)
Cardiology Office Note:  .   Date:  06/25/2023  ID:  Terry [name], DOB [birth year], MRN [id number] PCP: Dois Davenport, MD  [facility] Providers Cardiologist:  [name], MD Electrophysiologist:  Lanier Prude, MD {   History of Present Illness: .   Terry [name] is a 77 y.o. male with below history who presents for follow-up.    History of Present Illness   [name], a 77 year old male with a history of Parkinson's disease, neurogenic orthostatic hypotension, sinus pauses, peripheral arterial disease, carotid artery disease, and nonobstructive CAD, presents for a follow-up visit. The patient reports difficulty managing his blood pressure, experiencing wide fluctuations, often over 200/100. Despite these fluctuations, the patient denies any episodes of syncope but admits to near syncope on a couple of occasions. The patient is currently on hydralazine, taken twice daily when blood pressure exceeds 180, and valsartan 40mg  twice daily. He also wears compression stockings, noting a difference when not worn.  The patient also reports a recent change in his Parkinson's disease symptoms, with the onset of drooling and a droopy mouth over the past two years. He mentions that Nuplazid, a medication he was previously on, helped with these symptoms but caused his blood pressure to rise.  The patient also mentions a recent toe injury due to a hallucination-induced incident, resulting in a broken toe and persistent foot swelling. He is under the care of an orthopedist for this issue.  The patient also discusses his preference against the installation of a pacemaker, despite his history of sinus pauses. He expresses a desire for a natural course of life and death, without medical intervention to prolong life.          Problem List 1. Non-obstructive CAD -LHC 11/25/2019 with 20% mid LAD 2. Parkinson's  3. Carotid artery disease -L carotid endarterectomy 04/27/2021 4. CVA -2/2  carotid stenosis  5. PAD (abdominal) -SMA >70% -IMA >70% 6. HLD -Total cholesterol 78, HDL 33, LDL 38, triglycerides 34 7. Sinus pauses  -vagally mediated 8. Neurogenic orthostatic hypotension    ROS: All other ROS reviewed and negative. Pertinent positives noted in the HPI.     Studies Reviewed: Marland Kitchen   EKG Interpretation Date/Time:  Monday June 25 2023 13:31:21 EST Ventricular Rate:  74 PR Interval:  254 QRS Duration:  90 QT Interval:  384 QTC Calculation: 426 R Axis:   37  Text Interpretation: Sinus rhythm with 1st degree A-V block Nonspecific ST and T wave abnormality Confirmed by Lennie Odor [phone]) on 06/25/2023 1:34:21 PM   Physical Exam:   VS:  BP (!) 140/66 (BP Location: Left Arm, Patient Position: Sitting, Cuff Size: Normal)   Pulse 75   Ht 6' (1.829 m)   Wt 179 lb (81.2 kg)   SpO2 95%   BMI 24.28 kg/m    Wt Readings from Last 3 Encounters:  06/25/23 179 lb (81.2 kg)  06/04/23 175 lb (79.4 kg)  04/17/23 174 lb 12.8 oz (79.3 kg)    GEN: Well nourished, well developed in no acute distress NECK: No JVD; No carotid bruits CARDIAC: RRR, no murmurs, rubs, gallops RESPIRATORY:  Clear to auscultation without rales, wheezing or rhonchi  ABDOMEN: Soft, non-tender, non-distended EXTREMITIES:  No edema; No deformity  ASSESSMENT AND PLAN: .   Assessment and Plan    Neurogenic Orthostatic Hypotension Supine Hypertension  Wide fluctuations in blood pressure, largely neurologically mediated. No recent episodes of syncope, but occasional near syncope. Currently wearing compression stockings. -Increase Hydralazine  to 50mg  twice daily. -Continue Valsartan 40mg  twice daily. -Monitor blood pressure closely and report any symptoms.  Peripheral Arterial Disease (PAD) and Carotid Artery Disease Status post left carotid endarterectomy, no recent imaging since 2022. No symptoms of angina or abdominal mesenteric artery stenosis. -Schedule follow-up appointment with Dr. Myra Gianotti  for vascular surgery and imaging of carotids. -Continue Aspirin and Crestor (LDL cholesterol 38).   Sinus pauses -loop recorder in place. Seem to be vagally mediated. No symptoms. Not wanting a pacemaker. Avoid AV nodal agents.   Parkinson's Disease Reports new symptoms including droopy mouth and drooling. No hallucinations reported at this time. -Continue follow-up with neurology.  Follow-up Plans -Return for blood pressure check in 3 months. -Return to see me in 6 months.              Follow-up: Return in about 6 months (around 12/24/2023).  Time Spent with Patient: I have spent a total of 35 minutes caring for this patient today face to face, ordering and reviewing labs/tests, reviewing prior records/medical history, examining the patient, establishing an assessment and plan, communicating results/findings to the patient/family, and documenting in the medical record.   Signed, Lenna Gilford. Flora Lipps, MD, [facility]  [facility]  [address] [place], [place] [address] [phone]  1:53 PM

## 2023-06-25 ENCOUNTER — Encounter: Payer: Self-pay | Admitting: Cardiovascular Disease

## 2023-06-25 ENCOUNTER — Ambulatory Visit: Payer: Medicare Other | Attending: Cardiovascular Disease | Admitting: Cardiovascular Disease

## 2023-06-25 VITALS — BP 140/66 | HR 75 | Ht 72.0 in | Wt 179.0 lb

## 2023-06-25 DIAGNOSIS — G903 Multi-system degeneration of the autonomic nervous system: Secondary | ICD-10-CM | POA: Diagnosis not present

## 2023-06-25 DIAGNOSIS — I455 Other specified heart block: Secondary | ICD-10-CM

## 2023-06-25 DIAGNOSIS — I739 Peripheral vascular disease, unspecified: Secondary | ICD-10-CM

## 2023-06-25 DIAGNOSIS — I2510 Atherosclerotic heart disease of native coronary artery without angina pectoris: Secondary | ICD-10-CM | POA: Diagnosis not present

## 2023-06-25 DIAGNOSIS — E785 Hyperlipidemia, unspecified: Secondary | ICD-10-CM

## 2023-06-25 DIAGNOSIS — R001 Bradycardia, unspecified: Secondary | ICD-10-CM | POA: Diagnosis not present

## 2023-06-25 MED ORDER — HYDRALAZINE HCL 50 MG PO TABS: 50.0000 mg | ORAL_TABLET | Freq: Two times a day (BID) | ORAL | 3 refills | Status: DC

## 2023-06-25 NOTE — Patient Instructions (Signed)
Medication Instructions:  - Start Hydralizine 50mg  twice a day    *If you need a refill on your cardiac medications before your next appointment, please call your pharmacy*   Lab Work: None    If you have labs (blood work) drawn today and your tests are completely normal, you will receive your results only by: MyChart Message (if you have MyChart) OR A paper copy in the mail If you have any lab test that is abnormal or we need to change your treatment, we will call you to review the results.   Testing/Procedures: None    Follow-Up: At [facility], you and your health needs are our priority.  As part of our continuing mission to provide you with exceptional heart care, we have created designated Provider Care Teams.  These Care Teams include your primary Cardiologist (physician) and Advanced Practice Providers (APPs -  Physician Assistants and Nurse Practitioners) who all work together to provide you with the care you need, when you need it.  We recommend signing up for the patient portal called "MyChart".  Sign up information is provided on this After Visit Summary.  MyChart is used to connect with patients for Virtual Visits (Telemedicine).  Patients are able to view lab/test results, encounter notes, upcoming appointments, etc.  Non-urgent messages can be sent to your provider as well.   To learn more about what you can do with MyChart, go to [email or web address].    Your next appointment:   3 month(s)  The format for your next appointment:   In Person  Provider:   [name], PA-C    Then, [name], MD will plan to see you again in 6 month(s).   Other Instructions Dr. Flora Lipps recommends you continue wearing compression stockings daily. See the attached pamphlet for more information on proper wear and use.

## 2023-06-25 NOTE — Progress Notes (Signed)
Carelink Summary Report / Loop Recorder 

## 2023-06-28 ENCOUNTER — Ambulatory Visit: Payer: Medicare Other

## 2023-07-02 ENCOUNTER — Ambulatory Visit: Payer: Medicare Other

## 2023-07-10 ENCOUNTER — Other Ambulatory Visit: Payer: Self-pay | Admitting: *Deleted

## 2023-07-10 ENCOUNTER — Encounter: Payer: Medicare Other | Admitting: Cardiology

## 2023-07-10 DIAGNOSIS — I6529 Occlusion and stenosis of unspecified carotid artery: Secondary | ICD-10-CM

## 2023-07-11 NOTE — Progress Notes (Unsigned)
Cardiology Office Note Date:  07/11/2023  Patient ID:  Terry [name], Terry [name] [birth year], MRN [id number] PCP:  Terry Davenport, MD  Cardiologist:  Dr. Scharlene [name] Electrophysiologist: Dr. Lalla [name]    Chief Complaint:    f/u per Terry [name]  History of Present Illness: Terry [name] is a 77 y.o. 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 Myra [name]), stroke, no obstructive CAD, HLD, Parkinsons disease,   He saw Dr. Scharlene [name] Feb 2023, dizziness was mostly positional and por orthostatic sounding, he had markedly abnormal orthostatic vitals.  Ongoing weight loss being evaluated for mesenteric vascular disease He had a monitor with sinus pause that appeared to be vagal with P-P prolongation ahead, though with reports of syncope. Felt further evaluation was indicated and referred to EP. Though his symptoms more consistent w/o orthostatic and volume depletion. Advised better hydration and conpression  Subsequent loop implant  I saw him 02/01/22 to evaluate pauses on his loop He comes accompanied by his wife. He has not had syncope since his loop implant His neurologist recommended that he wear support stockings, his wife mentions that this did infact seem to help his symptoms, but he does not tend to actually wear them He does have orthostatic dizziness He does NOT have episodic symptoms, no moments of dizziness/weakness He has days of feeling poorly all day long, or some days all morning  He had a number of sinus pauses Longest 5.4 seconds may 26th He had a 3s pause 01/31/22 02: 29AM, slight sinus slowing leading to it, and ? If it comes out in a 2:1 AVblock, though V rates are very good in the 60's, perhaps a U wave. Other sinus pauses do happen in the day time, not with any suggestion of heart block None of them seem to correlate clearly with symptoms Pauses suspect to be incidental and not clearly the cause of any symptoms, advised hydration,  compression wear, symptom log with his device  He saw Dr. Lalla [name] 03/22/22, BP up/down, no recurrent syncope since improved hydration, overall significantly slowed over the years, actively working on weight loss.  Pending move to retirement home Did not think there is a clear correlation between his symptoms in the past and AV conduction abnormalities. We will continue loop recorder monitoring for any evidence of sustained AV conduction abnormalities.   Device alert note 09/18/22 with some pauses  I saw him Feb 2024 He is accompanied by hs wife. No near syncope or syncope, no spells of dizziness. He has days that he feels a bit lightheaded, more weak, though this is an all day symptom, not moments in times. No syncope. No cardiac awareness of any kind They live at [facility] [name] now and he as started walking more there in the fitness area, and his wife reports doing pretty well, but they both agree, the Parkinson's meds dont seem to help as much as they/neurology team had hoped. 5 pause episodes All have sinus slowing associated with pauses, these are associated with brief AVblock  Longed 4.6 seconds Occur during day time No changes were made  He saw Dr. Scharlene [name] April 2024, stable  He saw Dr. Lalla [name] 12/21/22, discussed pauses as vagally mediated, discussed perhaps PPM in the future if pauses become longer in effort to prevent falls, injury, planned for ongoing monitoring  ER visit 01/30/23 for CP/dizziness, left after prolonged wait time  Saw Terry [name] 02/02/23 for DOD visit Felt orthostatic hypotension have been exacerbated  by the introduction of Nuplazid discussed a trial off drug, pt was going to f/u his neurologist Advised monitoring of BP and holding his amlodipine or valsartan if low  Calls regarding his BP being elevated, with recommendation to increase his amlodipine.  Device clinic notes with pauses.  I saw him Aug 2024 He comes today with hs wife. Yesterday at about 10:00 he had  a sudden sharp pain in his chest/came/went, he did not have any sense of feeling lightheaded, weak, no near syncope or syncope. More of a startling jolt of pain.  This the only new symptom to report  He is concerned about intermittent high BPs, the security guard as Terry [name] (who is also an EMT) advised he sit and relax and check it after a few minutes and with this advice he has noticed that it does trickle back down.  He does c/w orthostatic lightheadedness Denies falls or faints  He would like Dr. Scharlene [name] and or Dr. Lalla [name] to reach out to his neurologist [facility] [name] regarding the experimental drug for his Parkinson's (Nuplazid), Terry [name] thought may be contributing to his orthostatic hypotension, so he has stopped it, spoke to his neurologist who did not think it was.  We discussed his pause yesterday, longer then his usual 3 seconds or so (though did have a 6.1 second episode in July as well Does appear to be CHB within the pause, sinus rates in the block are slower  Time lines up with the jolt he had He quickly says he does not want a pacemaker. He has a DNR does not want any prolongation of life, discusses that his Parkinson's is progressing, meds are working for shorter and shorter periods of time He has taken up poetry a gift that Parkinson's has given him, and is at peace that at some point he will die. His wife while tearful and saddened is very supportive of his wishes. Discussed that PPM would be to help with quality of life, not likely to prolong his life, as mentioned by TerryLambert in the past He did not want to pursue pacing Planned to monitor symptoms, ER if needed, escalation of symptoms or development of syncope  He has seen EP and gen cards team a few times since.  Terry Poche, PA-C 04/11/23, has had symptoms with NSR, and pauses without symptoms. He is very anxious to get back onto Parkinsons drug if he is able (Nuplazid).    He had very near syncope 3 weeks ago.  No  pause associated.  Asymptomatic with pause 9/16 - > Detection increased to > 4.5 seconds  Again pt deferred pacing  Last saw cards tem with Dr. Scharlene [name] 06/25/23, recent toe injury, following with ortho, his preference against the installation of a pacemaker, despite his history of sinus pauses. He expresses a desire for a natural course of life and death, without medical intervention to prolong life. Planned to avoid nodal blocking agents Discussed f/u with vascular for his carotid disease Hydralazine was increased  *** continue to see EP? *** continue to monitor?   Device information BSci loop implanted 12/13/21   Past Medical History:  Diagnosis Date   Carotid artery stenosis 04/29/2018   Chronic tension headaches 04/29/2018   Essential hypertension 04/29/2018   GERD (gastroesophageal reflux disease) 04/29/2018   HLD (hyperlipidemia) 04/29/2018   Parkinson's disease (HCC) 04/29/2018    Past Surgical History:  Procedure Laterality Date   BIOPSY  01/09/2022   Procedure: BIOPSY;  Surgeon: Lemar Lofty., MD;  Location: [facility];  Service: Gastroenterology;;  EGD and COLON   COLONOSCOPY WITH PROPOFOL N/A 01/09/2022   Procedure: COLONOSCOPY WITH PROPOFOL;  Surgeon: Meridee Score Netty Starring., MD;  Location: [facility];  Service: Gastroenterology;  Laterality: N/A;   ENDARTERECTOMY Left 04/27/2021   Procedure: LEFT CAROTID ENDARTERECTOMY;  Surgeon: Nada Libman, MD;  Location: [facility] OR;  Service: Vascular;  Laterality: Left;   ESOPHAGOGASTRODUODENOSCOPY (EGD) WITH PROPOFOL N/A 01/09/2022   Procedure: ESOPHAGOGASTRODUODENOSCOPY (EGD) WITH PROPOFOL;  Surgeon: Lemar Lofty., MD;  Location: [facility];  Service: Gastroenterology;  Laterality: N/A;   LEFT HEART CATH AND CORONARY ANGIOGRAPHY N/A 11/25/2019   Procedure: LEFT HEART CATH AND CORONARY ANGIOGRAPHY;  Surgeon: Lyn Records, MD;  Location: [facility];  Service: Cardiovascular;  Laterality: N/A;   PATCH  ANGIOPLASTY Left 04/27/2021   Procedure: PATCH ANGIOPLASTY USING Livia Snellen BIOLOGIC PATCH;  Surgeon: Nada Libman, MD;  Location: [facility];  Service: Vascular;  Laterality: Left;   TONSILLECTOMY      Current Outpatient Medications  Medication Sig Dispense Refill   acetaminophen (TYLENOL) 500 MG tablet Take 500 mg by mouth every 6 (six) hours as needed for moderate pain or headache.     aspirin EC 81 MG EC tablet Take 1 tablet (81 mg total) by mouth daily. Swallow whole. 30 tablet 1   atorvastatin (LIPITOR) 20 MG tablet TAKE ONE TABLET BY MOUTH EVERY NIGHT AT BEDTIME 90 tablet 0   brimonidine-timolol (COMBIGAN) 0.2-0.5 % ophthalmic solution Apply 1 drop to eye 2 (two) times daily.     carbidopa-levodopa (SINEMET CR) 50-200 MG tablet Take 1 tablet by mouth at bedtime. 90 tablet 1   carbidopa-levodopa (SINEMET IR) 25-100 MG tablet Take 2.5 tablets by mouth 3 (three) times daily. Patient also takes 2.5 tablets once daily     cetirizine (ZYRTEC) 10 MG tablet Take 10 mg by mouth as needed for allergies.     Cholecalciferol (VITAMIN D3) 1.25 MG (50000 UT) CAPS Take 50,000 Units by mouth once a week.     Cyanocobalamin (VITAMIN B-12 IJ) Inject 1,000 mcg as directed every 30 (thirty) days.     fluticasone (FLONASE) 50 MCG/ACT nasal spray Place 1 spray into both nostrils daily as needed for allergies.      hydrALAZINE (APRESOLINE) 50 MG tablet Take 1 tablet (50 mg total) by mouth 2 (two) times daily. 60 tablet 3   meloxicam (MOBIC) 7.5 MG tablet Take 7.5 mg by mouth 2 (two) times daily as needed.     NEUPRO 4 MG/24HR Place 1 patch onto the skin every morning.     NUPLAZID 34 MG CAPS Take 1 capsule by mouth daily.     olopatadine (PATADAY) 0.1 % ophthalmic solution Place 1 drop into both eyes daily as needed for allergies.     pantoprazole (PROTONIX) 40 MG tablet TAKE 1 TABLET BY MOUTH EVERY DAY 90 tablet 4   Prenatal Vit-Fe Fumarate-FA (M-NATAL PLUS) 27-1 MG TABS Take 1 tablet by mouth daily.      QUEtiapine (SEROQUEL) 25 MG tablet Take 25 mg by mouth at bedtime.     traZODone (DESYREL) 50 MG tablet Take 1 tablet (50 mg total) by mouth at bedtime as needed for sleep. (Patient taking differently: Take 50 mg by mouth at bedtime.) 90 tablet 0   valsartan (DIOVAN) 40 MG tablet Take 40 mg by mouth 2 (two) times daily.     No current facility-administered medications for this visit.    Allergies:   Penicillins,  Erythromycin, and Clam shell   Social History:  The patient  reports that he has never smoked. He has never been exposed to tobacco smoke. He has quit using smokeless tobacco.  His smokeless tobacco use included snuff. He reports current alcohol use. He reports that he does not use drugs.   Family History:  The patient's family history includes Cancer in his father; Heart disease in his mother; Parkinson's disease in his sister.  ROS:  Please see the history of present illness.    All other systems are reviewed and otherwise negative.   PHYSICAL EXAM:  VS:  There were no vitals taken for this visit. BMI: There is no height or weight on file to calculate BMI. Well nourished, well developed, in no acute distress HEENT: normocephalic, atraumatic Neck: no JVD, carotid bruits or masses Cardiac: *** RRR; no significant murmurs, no rubs, or gallops Lungs: *** CTA b/l, no wheezing, rhonchi or rales Abd: soft, nontender MS: no deformity, perhaps advanced atrophy Ext: *** trace if any edema Skin: warm and dry, no rash Neuro:  No gross deficits appreciated Psych: euthymic mood, full affect  *** ILR site is stable, no tethering or discomfort   EKG:  not done today  Device interrogation  Latitude from this AM *** Battery is OK *** Presenting is SR ***   Cardiac Telemetry 08/22/2021 Impression: 1. Episode of sinus pause 08/07/2021 @ 2:32 pm which possibly represent paroxysmal AV block (vagal mediated) due to lengthening of the P-P interval with dropped QRS complexes. Total  duration 4.4 seconds.  2. Second degree AV block, Mobitz 1 present.  3. Brief SVT was present, likely atrial tachycardia (6 episodes, longest 9.5 seconds).    TTE 04/24/2021  1. Left ventricular ejection fraction, by estimation, is 65 to 70%. The  left ventricle has normal function. The left ventricle has no regional  wall motion abnormalities. There is moderate left ventricular hypertrophy.  Left ventricular diastolic  parameters are indeterminate.   2. Right ventricular systolic function is normal. The right ventricular  size is normal.   3. Left atrial size was mildly dilated.   4. Right atrial size was mildly dilated.   5. The mitral valve is grossly normal. No evidence of mitral valve  regurgitation.   6. The aortic valve is tricuspid. Aortic valve regurgitation is not  visualized.    LHC 11/25/2019 Normal left ventricular function with EF 65%.  Normal LVEDP. Normal left main. Luminal irregularities in the proximal to mid LAD less than 20%. Large ramus intermedius with luminal irregularities.  Widely patent. Dominant right coronary without evidence of obstruction or atherosclerosis.  Recent Labs: 01/29/2023: BUN 16; Creatinine, Ser 0.88; Hemoglobin 13.9; Platelets 191; Potassium 4.0; Sodium 136  No results found for requested labs within last 365 days.   CrCl cannot be calculated (Patient's most recent lab result is older than the maximum 21 days allowed.).   Wt Readings from Last 3 Encounters:  06/25/23 179 lb (81.2 kg)  06/04/23 175 lb (79.4 kg)  04/17/23 174 lb 12.8 oz (79.3 kg)     Other studies reviewed: Additional studies/records reviewed today include: summarized above  ASSESSMENT AND PLAN:  Dizziness Syncope Orthostatic hypotension       They are aware of the importance of hydration, compression garments, safety Pauses  associated with heart block.        Suspect vagal with sinus slowing (seen in the past)        has had a couple 6 second events  ***  He at  this time, does not want a PPM, discussed above Asked to monitor symptoms, and discuss. If he were to develop near fainting/fainting that he seek attention, he doesn't want to keep going to the ER At the very least to let us know if he has developed symptoms    5. HTN *** He is worried about the very high BP's Discussed rational for allowing some degree of HTN *** Advised that if his BP is >180 persistently he could use a 1/2 tab (2.5mg ) of amlodipine if needed    Disposition: ***     Current medicines are reviewed at length with the patient today.  The patient did not have any concerns regarding medicines.  Norma Fredrickson, PA-C 07/11/2023 1:56 PM     [facility] [address] [place] [address] [phone] (office)  [phone] (fax)

## 2023-07-12 ENCOUNTER — Telehealth: Payer: Self-pay

## 2023-07-12 ENCOUNTER — Ambulatory Visit: Payer: Medicare Other | Attending: Physician Assistant | Admitting: Physician Assistant

## 2023-07-12 ENCOUNTER — Encounter: Payer: Self-pay | Admitting: Physician Assistant

## 2023-07-12 VITALS — BP 142/60 | HR 70 | Ht 72.0 in | Wt 179.6 lb

## 2023-07-12 DIAGNOSIS — I455 Other specified heart block: Secondary | ICD-10-CM | POA: Diagnosis not present

## 2023-07-12 DIAGNOSIS — I10 Essential (primary) hypertension: Secondary | ICD-10-CM

## 2023-07-12 DIAGNOSIS — Z95818 Presence of other cardiac implants and grafts: Secondary | ICD-10-CM

## 2023-07-12 NOTE — Telephone Encounter (Signed)
Per Renee the pt do not need to be remotely monitor for loop monthly just as needed.

## 2023-07-12 NOTE — Patient Instructions (Addendum)
Medication Instructions:    Your physician recommends that you continue on your current medications as directed. Please refer to the Current Medication list given to you today.   *If you need a refill on your cardiac medications before your next appointment, please call your pharmacy*   Lab Work: NONE ORDERED  TODAY    If you have labs (blood work) drawn today and your tests are completely normal, you will receive your results only by: MyChart Message (if you have MyChart) OR A paper copy in the mail If you have any lab test that is abnormal or we need to change your treatment, we will call you to review the results.   Testing/Procedures: NONE ORDERED  TODAY   Follow-Up: At [facility], you and your health needs are our priority.  As part of our continuing mission to provide you with exceptional heart care, we have created designated Provider Care Teams.  These Care Teams include your primary Cardiologist (physician) and Advanced Practice Providers (APPs -  Physician Assistants and Nurse Practitioners) who all work together to provide you with the care you need, when you need it.  We recommend signing up for the patient portal called "MyChart".  Sign up information is provided on this After Visit Summary.  MyChart is used to connect with patients for Virtual Visits (Telemedicine).  Patients are able to view lab/test results, encounter notes, upcoming appointments, etc.  Non-urgent messages can be sent to your provider as well.   To learn more about what you can do with MyChart, go to [email or web address].    Your next appointment:  CONTACT [facility] [phone] AS NEEDED FOR  ANY CARDIAC RELATED SYMPTOMS   Other Instructions  

## 2023-07-23 ENCOUNTER — Encounter: Payer: Self-pay | Admitting: Physician Assistant

## 2023-07-23 ENCOUNTER — Ambulatory Visit: Payer: Medicare Other | Admitting: Physician Assistant

## 2023-07-23 ENCOUNTER — Ambulatory Visit (HOSPITAL_COMMUNITY)
Admission: RE | Admit: 2023-07-23 | Discharge: 2023-07-23 | Disposition: A | Discharge status: Home / Self Care | Payer: Medicare Other | Source: Ambulatory Visit | Attending: Surgery | Admitting: Surgery

## 2023-07-23 VITALS — BP 200/82 | HR 64 | Temp 98.2°F | Wt 179.0 lb

## 2023-07-23 DIAGNOSIS — I6529 Occlusion and stenosis of unspecified carotid artery: Secondary | ICD-10-CM | POA: Insufficient documentation

## 2023-07-23 NOTE — Progress Notes (Signed)
History of Present Illness:  Patient is a 77 y.o. year old male who presents for evaluation of carotid stenosis.  He is S/Pleft carotid endarterectomy for symptomatic stenosis on 04/27/2021.   The patient denies symptoms of TIA, amaurosis, or stroke.  He denies amaurosis, weakness or aphasia.  The patient takes a statin for hypercholesterolemia.  He is medically managed for hypertension with an ARB.  He does have a history of Parkinson's disease.  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  He is happily married to his old friend from when he was in elementary school.    Past Medical History:  Diagnosis Date   Carotid artery stenosis 04/29/2018   Chronic tension headaches 04/29/2018   Essential hypertension 04/29/2018   GERD (gastroesophageal reflux disease) 04/29/2018   HLD (hyperlipidemia) 04/29/2018   Parkinson's disease (HCC) 04/29/2018    Past Surgical History:  Procedure Laterality Date   BIOPSY  01/09/2022   Procedure: BIOPSY;  Surgeon: Lemar Lofty., MD;  Location: [facility];  Service: Gastroenterology;;  EGD and COLON   COLONOSCOPY WITH PROPOFOL N/A 01/09/2022   Procedure: COLONOSCOPY WITH PROPOFOL;  Surgeon: Lemar Lofty., MD;  Location: [facility];  Service: Gastroenterology;  Laterality: N/A;   ENDARTERECTOMY Left 04/27/2021   Procedure: LEFT CAROTID ENDARTERECTOMY;  Surgeon: Nada Libman, MD;  Location: [facility];  Service: Vascular;  Laterality: Left;   ESOPHAGOGASTRODUODENOSCOPY (EGD) WITH PROPOFOL N/A 01/09/2022   Procedure: ESOPHAGOGASTRODUODENOSCOPY (EGD) WITH PROPOFOL;  Surgeon: Lemar Lofty., MD;  Location: [facility];  Service: Gastroenterology;  Laterality: N/A;   LEFT HEART CATH AND CORONARY ANGIOGRAPHY N/A 11/25/2019   Procedure: LEFT HEART CATH AND CORONARY ANGIOGRAPHY;  Surgeon: Lyn Records, MD;  [facility];  Service: Cardiovascular;  Laterality: N/A;   PATCH ANGIOPLASTY Left 04/27/2021    Procedure: PATCH ANGIOPLASTY USING Livia Snellen BIOLOGIC PATCH;  Surgeon: Nada Libman, MD;  Location: [facility];  Service: Vascular;  Laterality: Left;   TONSILLECTOMY       Social History Social History   Tobacco Use   Smoking status: Never    Passive exposure: Never   Smokeless tobacco: Former    Types: Snuff  Vaping Use   Vaping status: Never Used  Substance Use Topics   Alcohol use: Yes    Comment: Occasionally.   Drug use: Never    Family History Family History  Problem Relation Age of Onset   Heart disease Mother    Cancer Father    Parkinson's disease Sister    Colon cancer Neg Hx    Stomach cancer Neg Hx    Esophageal cancer Neg Hx    Inflammatory bowel disease Neg Hx    Liver disease Neg Hx    Pancreatic cancer Neg Hx    Rectal cancer Neg Hx     Allergies  Allergies  Allergen Reactions   Penicillins Rash   Erythromycin Rash   Clam Shell Nausea And Vomiting     Current Outpatient Medications  Medication Sig Dispense Refill   acetaminophen (TYLENOL) 500 MG tablet Take 500 mg by mouth every 6 (six) hours as needed for moderate pain or headache.     aspirin EC 81 MG EC tablet Take 1 tablet (81 mg total) by mouth daily. Swallow whole. 30 tablet 1   atorvastatin (LIPITOR) 20 MG tablet TAKE ONE TABLET BY MOUTH EVERY NIGHT AT BEDTIME 90 tablet 0   brimonidine-timolol (COMBIGAN) 0.2-0.5 % ophthalmic solution Apply  1 drop to eye 2 (two) times daily.     carbidopa-levodopa (SINEMET CR) 50-200 MG tablet Take 1 tablet by mouth at bedtime. 90 tablet 1   carbidopa-levodopa (SINEMET IR) 25-100 MG tablet Take 2.5 tablets by mouth 3 (three) times daily. Patient also takes 2.5 tablets once daily     cetirizine (ZYRTEC) 10 MG tablet Take 10 mg by mouth as needed for allergies.     Cholecalciferol (VITAMIN D3) 1.25 MG (50000 UT) CAPS Take 50,000 Units by mouth once a week.     Cyanocobalamin (VITAMIN B-12 IJ) Inject 1,000 mcg as directed every 30 (thirty) days.      fluticasone (FLONASE) 50 MCG/ACT nasal spray Place 1 spray into both nostrils daily as needed for allergies.      hydrALAZINE (APRESOLINE) 50 MG tablet Take 1 tablet (50 mg total) by mouth 2 (two) times daily. 60 tablet 3   meloxicam (MOBIC) 7.5 MG tablet Take 7.5 mg by mouth 2 (two) times daily as needed.     NEUPRO 4 MG/24HR Place 1 patch onto the skin every morning.     NUPLAZID 34 MG CAPS Take 1 capsule by mouth daily.     olopatadine (PATADAY) 0.1 % ophthalmic solution Place 1 drop into both eyes daily as needed for allergies.     pantoprazole (PROTONIX) 40 MG tablet TAKE 1 TABLET BY MOUTH EVERY DAY 90 tablet 4   Prenatal Vit-Fe Fumarate-FA (M-NATAL PLUS) 27-1 MG TABS Take 1 tablet by mouth daily.     QUEtiapine (SEROQUEL) 25 MG tablet Take 25 mg by mouth at bedtime.     traZODone (DESYREL) 50 MG tablet Take 1 tablet (50 mg total) by mouth at bedtime as needed for sleep. (Patient taking differently: Take 50 mg by mouth at bedtime.) 90 tablet 0   valsartan (DIOVAN) 40 MG tablet Take 40 mg by mouth 2 (two) times daily.     No current facility-administered medications for this visit.    ROS:   General:  No weight loss, Fever, chills  HEENT: No recent headaches, no nasal bleeding, no visual changes, no sore throat  Neurologic: No dizziness, blackouts, seizures. No recent symptoms of stroke or mini- stroke. No recent episodes of slurred speech, or temporary blindness.  Cardiac: No recent episodes of chest pain/pressure, no shortness of breath at rest.  No shortness of breath with exertion.  Denies history of atrial fibrillation or irregular heartbeat  Vascular: No history of rest pain in feet.  No history of claudication.  No history of non-healing ulcer, No history of DVT   Pulmonary: No home oxygen, no productive cough, no hemoptysis,  No asthma or wheezing  Musculoskeletal:  [ ]  Arthritis, [ ]  Low back pain,  [ ]  Joint pain  Hematologic:No history of hypercoagulable state.  No  history of easy bleeding.  No history of anemia  Gastrointestinal: No hematochezia or melena,  No gastroesophageal reflux, no trouble swallowing  Urinary: [ ]  chronic Kidney disease, [ ]  on HD - [ ]  MWF or [ ]  TTHS, [ ]  Burning with urination, [ ]  Frequent urination, [ ]  Difficulty urinating;   Skin: No rashes  Psychological: No history of anxiety,  No history of depression   Physical Examination  Vitals:   07/23/23 1220 07/23/23 1225  BP: (!) 207/80 (!) 200/82  Pulse: 64   Temp: 98.2 F (36.8 C)   TempSrc: Temporal   SpO2: 97%   Weight: 179 lb (81.2 kg)     Body mass  index is 24.28 kg/m.  General:  Alert and oriented, no acute distress HEENT: Normal Neck: No bruit or JVD Pulmonary: Clear to auscultation bilaterally Cardiac: Regular Rate and Rhythm without murmur Gastrointestinal: Soft, non-tender, non-distended, no mass, no scars Skin: No rash Extremity Pulses:  radial pulses bilaterally Musculoskeletal: No deformity or edema  Neurologic: Upper and lower extremity motor 5/5 and symmetric  DATA:  Right Carotid Findings:  +----------+--------+--------+--------+------------------------------+-----  ----+           PSV cm/sEDV cm/sStenosisPlaque Description             Comments   +----------+--------+--------+--------+------------------------------+-----  ----+  CCA Prox  114     21                                                        +----------+--------+--------+--------+------------------------------+-----  ----+  CCA Mid   125     23                                                        +----------+--------+--------+--------+------------------------------+-----  ----+  CCA Distal105     21      <50%    irregular, heterogenous and    Shadowing                                   calcific                                  +----------+--------+--------+--------+------------------------------+-----  ----+  ICA Prox  120      25      1-39%   irregular, heterogenous and    Shadowing                                   calcific                                  +----------+--------+--------+--------+------------------------------+-----  ----+  ICA Mid   113     27                                                        +----------+--------+--------+--------+------------------------------+-----  ----+  ICA Distal111     31                                                        +----------+--------+--------+--------+------------------------------+-----  ----+  ECA      148     14              calcific                                  +----------+--------+--------+--------+------------------------------+-----  ----+   +----------+--------+-------+----------------+-------------------+  PSV cm/sEDV cmsDescribe        Arm Pressure (mmHG)  +----------+--------+-------+----------------+-------------------+  [id number]           Multiphasic, [id number]                  +----------+--------+-------+----------------+-------------------+   +---------+--------+--+--------+--+---------+  VertebralPSV cm/s73EDV cm/s15Antegrade  +---------+--------+--+--------+--+---------+      Left Carotid Findings:  +----------+--------+--------+--------+------------------+--------+           PSV cm/sEDV cm/sStenosisPlaque DescriptionComments  +----------+--------+--------+--------+------------------+--------+  CCA Prox  159     19                                          +----------+--------+--------+--------+------------------+--------+  CCA Mid   119     17                                          +----------+--------+--------+--------+------------------+--------+  CCA Distal102     18                                          +----------+--------+--------+--------+------------------+--------+  ICA Prox  83      16      1-39%    heterogenous                +----------+--------+--------+--------+------------------+--------+  ICA Mid   86      21                                          +----------+--------+--------+--------+------------------+--------+  ICA Distal110     26                                          +----------+--------+--------+--------+------------------+--------+  ECA      135     16                                          +----------+--------+--------+--------+------------------+--------+   +----------+--------+--------+----------------+-------------------+           PSV cm/sEDV cm/sDescribe        Arm Pressure (mmHG)  +----------+--------+--------+----------------+-------------------+  [id number]            Multiphasic, [id number]                  +----------+--------+--------+----------------+-------------------+   +---------+--------+--+--------+--+---------+  VertebralPSV cm/s87EDV cm/s16Antegrade  +---------+--------+--+--------+--+---------+    Summary:  Right Carotid: Velocities in the right ICA are consistent with a 1-39%  stenosis.                Non-hemodynamically significant plaque <50% noted in the  CCA.   Left Carotid: Velocities in the left ICA are consistent with a 1-39%  stenosis.               The extracranial vessels were near-normal with only minimal  wall  thickening or plaque.   Vertebrals:  Bilateral vertebral arteries demonstrate antegrade flow.  Subclavians: Normal flow hemodynamics were seen in bilateral subclavian               arteries.     ASSESSMENT/PLAN:  s/p left CEA on 04/27/2021 by Dr. Myra Gianotti for symptomatic left carotid artery stenosis.   He remains asymptomatic for stroke/TIA symptoms since his last visit.  The duplex demonstrates no recurrent stenosis in the left ICA and no new stenosis in the right ICA.  B ICA < 39% stenosis.    He had elevated BP today and is states he always has increased  BP.  He takes his medication as prescribed and see's his PCP regularly.  He will plan for f/u in 1 year with repeat carotid duplex.       Mosetta Pigeon PA-C [facility] Office: [phone]   MD on call Hetty Blend

## 2023-07-23 NOTE — Progress Notes (Deleted)
History of Present Illness:  Patient is a 77 y.o. year old male who presents for evaluation of carotid stenosis.  The patient denies symptoms of TIA, amaurosis, or stroke.  The patient is currently on *** antiplatelet therapy.  The carotid stenosis was found ***.  Other medical problems include ***.  These are currently stable and followed by ***.  Past Medical History:  Diagnosis Date   Carotid artery stenosis 04/29/2018   Chronic tension headaches 04/29/2018   Essential hypertension 04/29/2018   GERD (gastroesophageal reflux disease) 04/29/2018   HLD (hyperlipidemia) 04/29/2018   Parkinson's disease (HCC) 04/29/2018    Past Surgical History:  Procedure Laterality Date   BIOPSY  01/09/2022   Procedure: BIOPSY;  Surgeon: Lemar Lofty., MD;  Location: [facility];  Service: Gastroenterology;;  EGD and COLON   COLONOSCOPY WITH PROPOFOL N/A 01/09/2022   Procedure: COLONOSCOPY WITH PROPOFOL;  Surgeon: Lemar Lofty., MD;  Location: [facility];  Service: Gastroenterology;  Laterality: N/A;   ENDARTERECTOMY Left 04/27/2021   Procedure: LEFT CAROTID ENDARTERECTOMY;  Surgeon: Nada Libman, MD;  Location: [facility];  Service: Vascular;  Laterality: Left;   ESOPHAGOGASTRODUODENOSCOPY (EGD) WITH PROPOFOL N/A 01/09/2022   Procedure: ESOPHAGOGASTRODUODENOSCOPY (EGD) WITH PROPOFOL;  Surgeon: Lemar Lofty., MD;  Location: [facility];  Service: Gastroenterology;  Laterality: N/A;   LEFT HEART CATH AND CORONARY ANGIOGRAPHY N/A 11/25/2019   Procedure: LEFT HEART CATH AND CORONARY ANGIOGRAPHY;  Surgeon: Lyn Records, MD;  Location: [facility];  Service: Cardiovascular;  Laterality: N/A;   PATCH ANGIOPLASTY Left 04/27/2021   Procedure: PATCH ANGIOPLASTY USING Livia Snellen BIOLOGIC PATCH;  Surgeon: Nada Libman, MD;  Location: [facility];  Service: Vascular;  Laterality: Left;   TONSILLECTOMY       Social History Social History   Tobacco Use   Smoking status: Never    Passive  exposure: Never   Smokeless tobacco: Former    Types: Snuff  Vaping Use   Vaping status: Never Used  Substance Use Topics   Alcohol use: Yes    Comment: Occasionally.   Drug use: Never    Family History Family History  Problem Relation Age of Onset   Heart disease Mother    Cancer Father    Parkinson's disease Sister    Colon cancer Neg Hx    Stomach cancer Neg Hx    Esophageal cancer Neg Hx    Inflammatory bowel disease Neg Hx    Liver disease Neg Hx    Pancreatic cancer Neg Hx    Rectal cancer Neg Hx     Allergies  Allergies  Allergen Reactions   Penicillins Rash   Erythromycin Rash   Clam Shell Nausea And Vomiting     Current Outpatient Medications  Medication Sig Dispense Refill   acetaminophen (TYLENOL) 500 MG tablet Take 500 mg by mouth every 6 (six) hours as needed for moderate pain or headache.     aspirin EC 81 MG EC tablet Take 1 tablet (81 mg total) by mouth daily. Swallow whole. 30 tablet 1   atorvastatin (LIPITOR) 20 MG tablet TAKE ONE TABLET BY MOUTH EVERY NIGHT AT BEDTIME 90 tablet 0   brimonidine-timolol (COMBIGAN) 0.2-0.5 % ophthalmic solution Apply 1 drop to eye 2 (two) times daily.     carbidopa-levodopa (SINEMET CR) 50-200 MG tablet Take 1 tablet by mouth at bedtime. 90 tablet 1   carbidopa-levodopa (SINEMET IR) 25-100 MG tablet Take 2.5 tablets by mouth 3 (three) times daily. Patient also takes  2.5 tablets once daily     cetirizine (ZYRTEC) 10 MG tablet Take 10 mg by mouth as needed for allergies.     Cholecalciferol (VITAMIN D3) 1.25 MG (50000 UT) CAPS Take 50,000 Units by mouth once a week.     Cyanocobalamin (VITAMIN B-12 IJ) Inject 1,000 mcg as directed every 30 (thirty) days.     fluticasone (FLONASE) 50 MCG/ACT nasal spray Place 1 spray into both nostrils daily as needed for allergies.      hydrALAZINE (APRESOLINE) 50 MG tablet Take 1 tablet (50 mg total) by mouth 2 (two) times daily. 60 tablet 3   meloxicam (MOBIC) 7.5 MG tablet Take 7.5 mg  by mouth 2 (two) times daily as needed.     NEUPRO 4 MG/24HR Place 1 patch onto the skin every morning.     NUPLAZID 34 MG CAPS Take 1 capsule by mouth daily.     olopatadine (PATADAY) 0.1 % ophthalmic solution Place 1 drop into both eyes daily as needed for allergies.     pantoprazole (PROTONIX) 40 MG tablet TAKE 1 TABLET BY MOUTH EVERY DAY 90 tablet 4   Prenatal Vit-Fe Fumarate-FA (M-NATAL PLUS) 27-1 MG TABS Take 1 tablet by mouth daily.     QUEtiapine (SEROQUEL) 25 MG tablet Take 25 mg by mouth at bedtime.     traZODone (DESYREL) 50 MG tablet Take 1 tablet (50 mg total) by mouth at bedtime as needed for sleep. (Patient taking differently: Take 50 mg by mouth at bedtime.) 90 tablet 0   valsartan (DIOVAN) 40 MG tablet Take 40 mg by mouth 2 (two) times daily.     No current facility-administered medications for this visit.    ROS:   General:  No weight loss, Fever, chills  HEENT: No recent headaches, no nasal bleeding, no visual changes, no sore throat  Neurologic: No dizziness, blackouts, seizures. No recent symptoms of stroke or mini- stroke. No recent episodes of slurred speech, or temporary blindness.  Cardiac: No recent episodes of chest pain/pressure, no shortness of breath at rest.  No shortness of breath with exertion.  Denies history of atrial fibrillation or irregular heartbeat  Vascular: No history of rest pain in feet.  No history of claudication.  No history of non-healing ulcer, No history of DVT   Pulmonary: No home oxygen, no productive cough, no hemoptysis,  No asthma or wheezing  Musculoskeletal:  [ ]  Arthritis, [ ]  Low back pain,  [ ]  Joint pain  Hematologic:No history of hypercoagulable state.  No history of easy bleeding.  No history of anemia  Gastrointestinal: No hematochezia or melena,  No gastroesophageal reflux, no trouble swallowing  Urinary: [ ]  chronic Kidney disease, [ ]  on HD - [ ]  MWF or [ ]  TTHS, [ ]  Burning with urination, [ ]  Frequent urination, [ ]   Difficulty urinating;   Skin: No rashes  Psychological: No history of anxiety,  No history of depression   Physical Examination  Vitals:   07/23/23 1220 07/23/23 1225  BP: (!) 207/80 (!) 200/82  Pulse: 64   Temp: 98.2 F (36.8 C)   TempSrc: Temporal   SpO2: 97%   Weight: 179 lb (81.2 kg)     Body mass index is 24.28 kg/m.  General:  Alert and oriented, no acute distress HEENT: Normal Neck: No bruit or JVD Pulmonary: Clear to auscultation bilaterally Cardiac: Regular Rate and Rhythm without murmur Gastrointestinal: Soft, non-tender, non-distended, no mass, no scars Skin: No rash Extremity Pulses:  2+ radial,  brachial, femoral, dorsalis pedis, posterior tibial pulses bilaterally Musculoskeletal: No deformity or edema  Neurologic: Upper and lower extremity motor 5/5 and symmetric  DATA:     Right Carotid Findings:  +----------+--------+--------+--------+------------------------------+-----  ----+           PSV cm/sEDV cm/sStenosisPlaque Description             Comments   +----------+--------+--------+--------+------------------------------+-----  ----+  CCA Prox  114     21                                                        +----------+--------+--------+--------+------------------------------+-----  ----+  CCA Mid   125     23                                                        +----------+--------+--------+--------+------------------------------+-----  ----+  CCA Distal105     21      <50%    irregular, heterogenous and    Shadowing                                   calcific                                  +----------+--------+--------+--------+------------------------------+-----  ----+  ICA Prox  120     25      1-39%   irregular, heterogenous and    Shadowing                                   calcific                                   +----------+--------+--------+--------+------------------------------+-----  ----+  ICA Mid   113     27                                                        +----------+--------+--------+--------+------------------------------+-----  ----+  ICA Distal111     31                                                        +----------+--------+--------+--------+------------------------------+-----  ----+  ECA      148     14              calcific                                  +----------+--------+--------+--------+------------------------------+-----  ----+   +----------+--------+-------+----------------+-------------------+  PSV cm/sEDV cmsDescribe        Arm Pressure (mmHG)  +----------+--------+-------+----------------+-------------------+  [id number]           Multiphasic, [id number]                  +----------+--------+-------+----------------+-------------------+   +---------+--------+--+--------+--+---------+  VertebralPSV cm/s73EDV cm/s15Antegrade  +---------+--------+--+--------+--+---------+      Left Carotid Findings:  +----------+--------+--------+--------+------------------+--------+           PSV cm/sEDV cm/sStenosisPlaque DescriptionComments  +----------+--------+--------+--------+------------------+--------+  CCA Prox  159     19                                          +----------+--------+--------+--------+------------------+--------+  CCA Mid   119     17                                          +----------+--------+--------+--------+------------------+--------+  CCA Distal102     18                                          +----------+--------+--------+--------+------------------+--------+  ICA Prox  83      16      1-39%   heterogenous                +----------+--------+--------+--------+------------------+--------+  ICA Mid   86      21                                           +----------+--------+--------+--------+------------------+--------+  ICA Distal110     26                                          +----------+--------+--------+--------+------------------+--------+  ECA      135     16                                          +----------+--------+--------+--------+------------------+--------+   +----------+--------+--------+----------------+-------------------+           PSV cm/sEDV cm/sDescribe        Arm Pressure (mmHG)  +----------+--------+--------+----------------+-------------------+  [id number]            Multiphasic, [id number]                  +----------+--------+--------+----------------+-------------------+   +---------+--------+--+--------+--+---------+  VertebralPSV cm/s87EDV cm/s16Antegrade  +---------+--------+--+--------+--+---------+         Summary:  Right Carotid: Velocities in the right ICA are consistent with a 1-39%  stenosis.                Non-hemodynamically significant plaque <50% noted in the  CCA.   Left Carotid: Velocities in the left ICA are consistent with a 1-39%  stenosis.               The extracranial vessels were near-normal with only minimal  wall  thickening or plaque.   Vertebrals:  Bilateral vertebral arteries demonstrate antegrade flow.  Subclavians: Normal flow hemodynamics were seen in bilateral subclavian               arteries.    ASSESSMENT:    PLAN:   Fabienne Bruns, MD [facility] Office: [phone] Pager: [phone]

## 2023-08-02 ENCOUNTER — Other Ambulatory Visit: Payer: Self-pay

## 2023-08-02 DIAGNOSIS — I6529 Occlusion and stenosis of unspecified carotid artery: Secondary | ICD-10-CM

## 2023-08-20 ENCOUNTER — Ambulatory Visit: Payer: Medicare Other

## 2023-08-20 DIAGNOSIS — R55 Syncope and collapse: Secondary | ICD-10-CM | POA: Diagnosis not present

## 2023-08-20 LAB — CUP PACEART REMOTE DEVICE CHECK
Date Time Interrogation Session: 20250127001500
Implantable Pulse Generator Implant Date: 20230523
Pulse Gen Serial Number: 175044

## 2023-08-21 ENCOUNTER — Encounter: Payer: Self-pay | Admitting: Cardiology

## 2023-09-24 ENCOUNTER — Ambulatory Visit

## 2023-09-25 ENCOUNTER — Encounter: Payer: Self-pay | Admitting: Cardiology

## 2023-09-25 LAB — CUP PACEART REMOTE DEVICE CHECK
Date Time Interrogation Session: 20250303022100
Implantable Pulse Generator Implant Date: 20230523
Pulse Gen Serial Number: 175044

## 2023-09-28 NOTE — Progress Notes (Signed)
 Bsx Loop Recorder

## 2023-11-03 ENCOUNTER — Other Ambulatory Visit: Payer: Self-pay | Admitting: Cardiovascular Disease

## 2023-12-10 ENCOUNTER — Ambulatory Visit: Attending: Nurse Practitioner | Admitting: Nurse Practitioner

## 2023-12-10 ENCOUNTER — Encounter: Payer: Self-pay | Admitting: Nurse Practitioner

## 2023-12-10 VITALS — BP 152/64 | HR 76 | Ht 72.0 in

## 2023-12-10 DIAGNOSIS — I10 Essential (primary) hypertension: Secondary | ICD-10-CM

## 2023-12-10 DIAGNOSIS — I455 Other specified heart block: Secondary | ICD-10-CM

## 2023-12-10 DIAGNOSIS — R001 Bradycardia, unspecified: Secondary | ICD-10-CM | POA: Diagnosis not present

## 2023-12-10 DIAGNOSIS — G903 Multi-system degeneration of the autonomic nervous system: Secondary | ICD-10-CM

## 2023-12-10 DIAGNOSIS — I2510 Atherosclerotic heart disease of native coronary artery without angina pectoris: Secondary | ICD-10-CM | POA: Diagnosis not present

## 2023-12-10 DIAGNOSIS — Z87898 Personal history of other specified conditions: Secondary | ICD-10-CM | POA: Diagnosis not present

## 2023-12-10 DIAGNOSIS — I739 Peripheral vascular disease, unspecified: Secondary | ICD-10-CM

## 2023-12-10 DIAGNOSIS — E785 Hyperlipidemia, unspecified: Secondary | ICD-10-CM

## 2023-12-10 NOTE — Progress Notes (Signed)
 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  Past Medical History    Past Medical History:  Diagnosis Date   Carotid artery stenosis 04/29/2018   Chronic tension headaches 04/29/2018   Essential hypertension 04/29/2018   GERD (gastroesophageal reflux disease) 04/29/2018   HLD (hyperlipidemia) 04/29/2018   Parkinson's disease (HCC) 04/29/2018   Past Surgical History:  Procedure Laterality Date   BIOPSY  01/09/2022   Procedure: BIOPSY;  Surgeon: Normie Becton., MD;  Location: [facility];  Service: Gastroenterology;;  EGD and COLON   COLONOSCOPY WITH PROPOFOL  N/A 01/09/2022   Procedure: COLONOSCOPY WITH PROPOFOL ;  Surgeon: Normie Becton., MD;  Location: [facility];  Service: Gastroenterology;  Laterality: N/A;   ENDARTERECTOMY Left 04/27/2021   Procedure: LEFT CAROTID ENDARTERECTOMY;  Surgeon: Margherita Shell, MD;  Location: [facility] OR;  Service: Vascular;  Laterality: Left;   ESOPHAGOGASTRODUODENOSCOPY (EGD) WITH PROPOFOL  N/A 01/09/2022   Procedure: ESOPHAGOGASTRODUODENOSCOPY (EGD) WITH PROPOFOL ;  Surgeon: Normie Becton., MD;  Location: [facility];  Service: Gastroenterology;  Laterality: N/A;   LEFT HEART CATH AND CORONARY ANGIOGRAPHY N/A 11/25/2019   Procedure: LEFT HEART CATH AND CORONARY ANGIOGRAPHY;  Surgeon: Arty Binning, MD;  Location: [facility];  Service: Cardiovascular;  Laterality: N/A;   PATCH ANGIOPLASTY  Left 04/27/2021   Procedure: PATCH ANGIOPLASTY USING Corinna Dickens BIOLOGIC PATCH;  Surgeon: Margherita Shell, MD;  Location: [facility] OR;  Service: Vascular;  Laterality: Left;   TONSILLECTOMY      Allergies  Allergies  Allergen Reactions   Penicillins Rash   Erythromycin Rash   Clam Shell Nausea And Vomiting     Labs/Other Studies Reviewed    The following studies were reviewed today:  Cardiac Studies & Procedures   ______________________________________________________________________________________________ CARDIAC CATHETERIZATION  CARDIAC CATHETERIZATION 11/25/2019  Conclusion  Normal left ventricular function with EF 65%.  Normal LVEDP.  Normal left main.  Luminal irregularities in the proximal to mid LAD less than 20%.  Large ramus intermedius with luminal irregularities.  Widely patent.  Dominant right coronary without evidence of obstruction or atherosclerosis.  RECOMMENDATIONS:   Continue preventive therapy for CAD.  Dyspnea on exertion could be related to diastolic dysfunction related to age or deconditioning.  Findings Coronary Findings Diagnostic  Dominance: Right  Left Anterior Descending Prox LAD to Mid LAD lesion is 20% stenosed.  Ramus Intermedius Vessel is large.  Left Circumflex  First Obtuse Marginal Branch Vessel is small in size.  Second Obtuse Marginal Branch Vessel is small in size.  Intervention  No interventions have been documented.     ECHOCARDIOGRAM  ECHOCARDIOGRAM COMPLETE 04/24/2021  Narrative ECHOCARDIOGRAM REPORT    Patient Name:   Terry [name] Date of Exam: 04/24/2021 Medical Rec #:  [id number]     Height:       73.0 in Accession #:    [id number]    Weight:       184.0 lb Date of Birth:  11-23-1945     BSA:  2.077 m Patient Age:    74 years      BP:           164/61 mmHg Patient Gender: M             HR:           69 bpm. Exam Location:  Inpatient  Procedure: 2D Echo, Cardiac Doppler and Color  Doppler  Indications:    Stroke I63.9  History:        Patient has prior history of Echocardiogram examinations, most recent 10/31/2019. Carotid Disease; Risk Factors:Dyslipidemia and Hypertension. Parkinsons.  Sonographer:    Konnie Perry RDCS Referring Phys: [id number] Roxana Copier  IMPRESSIONS   1. Left ventricular ejection fraction, by estimation, is 65 to 70%. The left ventricle has normal function. The left ventricle has no regional wall motion abnormalities. There is moderate left ventricular hypertrophy. Left ventricular diastolic parameters are indeterminate. 2. Right ventricular systolic function is normal. The right ventricular size is normal. 3. Left atrial size was mildly dilated. 4. Right atrial size was mildly dilated. 5. The mitral valve is grossly normal. No evidence of mitral valve regurgitation. 6. The aortic valve is tricuspid. Aortic valve regurgitation is not visualized.  Comparison(s): Prior images reviewed side by side. No significant change in LVEF.  FINDINGS Left Ventricle: Left ventricular ejection fraction, by estimation, is 65 to 70%. The left ventricle has normal function. The left ventricle has no regional wall motion abnormalities. The left ventricular internal cavity size was normal in size. There is moderate left ventricular hypertrophy. Left ventricular diastolic parameters are indeterminate.  Right Ventricle: The right ventricular size is normal. No increase in right ventricular wall thickness. Right ventricular systolic function is normal.  Left [facility]: Left atrial size was mildly dilated.  Right [facility]: Right atrial size was mildly dilated.  Pericardium: There is no evidence of pericardial effusion.  Mitral Valve: The mitral valve is grossly normal. No evidence of mitral valve regurgitation.  Tricuspid Valve: The tricuspid valve is grossly normal. Tricuspid valve regurgitation is mild.  Aortic Valve: The aortic valve is tricuspid. There  is mild to moderate aortic valve annular calcification. Aortic valve regurgitation is not visualized.  Pulmonic Valve: The pulmonic valve was grossly normal. Pulmonic valve regurgitation is trivial.  Aorta: The aortic root is normal in size and structure.  IAS/Shunts: The interatrial septum appears to be lipomatous. No atrial level shunt detected by color flow Doppler.   LEFT VENTRICLE PLAX 2D LVIDd:         4.60 cm  Diastology LV PW:         1.10 cm  LV e' medial:    6.00 cm/s LV IVS:        1.20 cm  LV E/e' medial:  11.4 LVOT diam:     2.00 cm  LV e' lateral:   6.08 cm/s LV SV:         62       LV E/e' lateral: 11.2 LV SV Index:   30 LVOT Area:     3.14 cm   RIGHT VENTRICLE RV S prime:     15.30 cm/s TAPSE (M-mode): 3.7 cm  LEFT ATRIUM             Index       RIGHT ATRIUM           Index LA diam:        2.80 cm 1.35 cm/m  RA Area:     22.80  cm LA Vol (A2C):   58.5 ml 28.17 ml/m RA Volume:   71.10 ml  34.24 ml/m LA Vol (A4C):   75.2 ml 36.21 ml/m LA Biplane Vol: 67.9 ml 32.70 ml/m AORTIC VALVE LVOT Vmax:   105.00 cm/s LVOT Vmean:  68.500 cm/s LVOT VTI:    0.197 m  AORTA Ao Root diam: 3.00 cm Ao Asc diam:  3.00 cm  MITRAL VALVE MV Area (PHT): 3.16 cm    SHUNTS MV Decel Time: 240 msec    Systemic VTI:  0.20 m MV E velocity: 68.40 cm/s  Systemic Diam: 2.00 cm MV A velocity: 82.30 cm/s MV E/A ratio:  0.83  Teddie Favre MD Electronically signed by Teddie Favre MD Signature Date/Time: 04/24/2021/3:25:55 PM    Final    MONITORS  LONG TERM MONITOR-LIVE TELEMETRY (3-14 DAYS) 08/15/2021  Narrative Enrollment 08/01/2021-08/08/2021 (6 days 21 hours). Patient had a min HR of 22 bpm (sinus bradycardia with sinus pause), max HR of 141 bpm (supraventricualr tachycardia), and avg HR of 71 bpm (normal sinus rhythm). Predominant underlying rhythm was Sinus Rhythm. First Degree AV Block was present. 6 Supraventricular Tachycardia runs occurred, the run with the  fastest interval lasting 20 beats (9.5 seconds) with a max rate of 141 bpm (avg 122 bpm); the run with the fastest interval was also the longest. 3 Pauses occurred, the longest lasting 4.4 secs (14 bpm). Second Degree AV Block-Mobitz I (Wenckebach) was present. Isolated SVEs were rare (<1.0%), SVE Couplets were rare (<1.0%), and SVE Triplets were rare (<1.0%). Isolated VEs were rare (<1.0%, 63), VE Couplets were rare (<1.0%, 2), and VE Triplets were rare (<1.0%, 1).  Impression: 1. Episode of sinus pause 08/07/2021 @ 2:32 pm which possibly represent paroxysmal AV block (vagal mediated) due to lengthening of the P-P interval with dropped QRS complexes. Total duration 4.4 seconds. 2. Second degree AV block, Mobitz 1 present. 3. Brief SVT was present, likely atrial tachycardia (6 episodes, longest 9.5 seconds).  Melodee Spruce T. Rolm Clos, MD, [facility]  [facility] [address] [place], [place] [address] [phone] 12:17 PM       ______________________________________________________________________________________________     Recent Labs: 01/29/2023: BUN 16; Creatinine, Ser 0.88; Hemoglobin 13.9; Platelets 191; Potassium 4.0; Sodium 136  Recent Lipid Panel    Component Value Date/Time   CHOL 78 04/28/2021 0445   CHOL 99 (L) 12/10/2019 1112   TRIG 52 04/28/2021 0445   HDL 37 (L) 04/28/2021 0445   HDL 47 12/10/2019 1112   CHOLHDL 2.1 04/28/2021 0445   VLDL 10 04/28/2021 0445   LDLCALC 31 04/28/2021 0445   LDLCALC 39 12/10/2019 1112    History of Present Illness    78 year old male with the above past medical history including mild nonobstructive CAD, PAD with known carotid disease s/p L CEA in 2022, 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  He was referred to cardiology in  2021 in the setting of progressive dyspnea on exertion, exertional chest discomfort.  Echocardiogram in 10/2019 showed LVEF 65 to 70%, no RWMA, moderate asymmetric LVH of the basal septal segment, G2 DD.  LHC in 11/2019 showed minimal luminal irregularities, 20% stenosis of the LAD, no significant CAD.  He was hospitalized in 2022 in the setting of CVA.  Echocardiogram at the time showed EF 65 to 70%, normal wall motion, moderate LVH, no significant valvular disease.  MRI of the neck showed high-grade left ICA stenosis (80%).  Vascular surgery was consulted and he underwent left carotid endarterectomy.  He was started on Plavix .  He was subsequently diagnosed with mesenteric artery disease, followed by vascular surgery.  Cardiac monitor in 07/2021 in the setting of syncope showed intermittent second-degree AV block, Mobitz type I, 3 pauses (longest lasting 4.4 seconds), brief episodes of SVT (longest episode lasting 9.5 seconds).  His syncope was noted to most likely be related to neurogenic orthostatic hypotension in the setting of Parkinson's disease.  He was referred to EP and underwent placement of loop recorder in 11/2021.  Recorder has shown intermittent pauses, longest lasting up to 6 seconds.  He has declined PPM.  He was last seen in the office on 07/12/2023 by EP and was stable from a cardiac standpoint.  He again declined PPM.  He was evaluated at the EP department at [facility] in March 2025 in the setting of generalized weakness, possible syncope.  He presents today for follow-up accompanied by his wife.  Since his last visit He has been stable overall from a cardiac standpoint.  He will note an occasional "twinge" in his chest, he has noted some "shallow breathing", he denies any exertional symptoms concerning for angina, denies edema, PND, orthopnea, weight gain. He will note occasional lightheadedness with position changes, he denies any recurrent presyncope or syncope.  Overall, symptoms have been  stable.    Home Medications    Current Outpatient Medications  Medication Sig Dispense Refill   acetaminophen  (TYLENOL ) 500 MG tablet Take 500 mg by mouth every 6 (six) hours as needed for moderate pain or headache.     aspirin  EC 81 MG EC tablet Take 1 tablet (81 mg total) by mouth daily. Swallow whole. 30 tablet 1   atorvastatin  (LIPITOR) 20 MG tablet TAKE ONE TABLET BY MOUTH EVERY NIGHT AT BEDTIME 90 tablet 0   brimonidine-timolol (COMBIGAN) 0.2-0.5 % ophthalmic solution Apply 1 drop to eye 2 (two) times daily.     carbidopa -levodopa  (SINEMET  CR) 50-200 MG tablet Take 1 tablet by mouth at bedtime. 90 tablet 1   carbidopa -levodopa  (SINEMET  IR) 25-100 MG tablet Take 2.5 tablets by mouth 3 (three) times daily. Patient also takes 2.5 tablets once daily     cetirizine (ZYRTEC) 10 MG tablet Take 10 mg by mouth as needed for allergies.     Cholecalciferol (VITAMIN D3) 1.25 MG (50000 UT) CAPS Take 50,000 Units by mouth once a week.     Cyanocobalamin (VITAMIN B-12 IJ) Inject 1,000 mcg as directed every 30 (thirty) days.     fluticasone  (FLONASE ) 50 MCG/ACT nasal spray Place 1 spray into both nostrils daily as needed for allergies.      hydrALAZINE  (APRESOLINE ) 50 MG tablet TAKE 1 TABLET(50 MG) BY MOUTH TWICE DAILY 180 tablet 2   meloxicam  (MOBIC ) 7.5 MG tablet Take 7.5 mg by mouth 2 (two) times daily as needed.     NEUPRO  4 MG/24HR Place 1 patch onto the skin every morning.     olopatadine  (PATADAY ) 0.1 % ophthalmic solution Place 1 drop into both eyes daily as needed for allergies.     pantoprazole  (PROTONIX ) 40 MG tablet TAKE 1 TABLET BY MOUTH EVERY DAY 90 tablet 4   Prenatal Vit-Fe Fumarate-FA (M-NATAL PLUS) 27-1 MG TABS Take 1 tablet by mouth daily.     QUEtiapine (SEROQUEL) 25 MG tablet Take 25 mg by mouth at bedtime.     valsartan  (DIOVAN ) 40 MG tablet  Take 40 mg by mouth 2 (two) times daily.     NUPLAZID 34 MG CAPS Take 1 capsule by mouth daily.     traZODone  (DESYREL ) 50 MG tablet Take 1  tablet (50 mg total) by mouth at bedtime as needed for sleep. (Patient taking differently: Take 50 mg by mouth at bedtime.) 90 tablet 0   No current facility-administered medications for this visit.     Review of Systems    He denies chest pain, palpitations, dyspnea, pnd, orthopnea, n, v, syncope, edema, weight gain, or early satiety. All other systems reviewed and are otherwise negative except as noted above.   Physical Exam    VS:  BP (!) 152/64   Pulse 76   Ht 6' (1.829 m)   SpO2 94%   BMI 24.28 kg/m   GEN: Well nourished, well developed, in no acute distress. HEENT: normal. Neck: Supple, no JVD, carotid bruits, or masses. Cardiac: RRR, 2/6 murmurs, no rubs, or gallops. No clubbing, cyanosis, edema.  Radials/DP/PT 2+ and equal bilaterally.  Respiratory:  Respirations regular and unlabored, clear to auscultation bilaterally. GI: Soft, nontender, nondistended, BS + x 4. MS: no deformity or atrophy. Skin: warm and dry, no rash. Neuro:  Strength and sensation are intact. Psych: Normal affect.  Accessory Clinical Findings    ECG personally reviewed by me today -    - no EKG in office today.    Lab Results  Component Value Date   WBC 8.5 01/29/2023   HGB 13.9 01/29/2023   HCT 41.7 01/29/2023   MCV 89.1 01/29/2023   PLT 191 01/29/2023   Lab Results  Component Value Date   CREATININE 0.88 01/29/2023   BUN 16 01/29/2023   NA 136 01/29/2023   K 4.0 01/29/2023   CL 103 01/29/2023   CO2 24 01/29/2023   Lab Results  Component Value Date   ALT 7 06/25/2022   AST 15 06/25/2022   ALKPHOS 54 06/25/2022   BILITOT 0.8 06/25/2022   Lab Results  Component Value Date   CHOL 78 04/28/2021   HDL 37 (L) 04/28/2021   LDLCALC 31 04/28/2021   TRIG 52 04/28/2021   CHOLHDL 2.1 04/28/2021    Lab Results  Component Value Date   HGBA1C 5.4 04/24/2021    Assessment & Plan...Aaron AasAaron Aas99    1. Nonobstructive CAD: LHC in 11/2019 showed minimal luminal irregularities, 20% stenosis of the  LAD, no significant CAD. He will note an occasional "twinge" in his chest, he has noted some "shallow breathing", he denies any exertional symptoms concerning for angina. We discussed possible repeat echocardiogram, through shared decision making, will defer for now as he is not interested in any invasive procedures.  Continue aspirin , valsartan , hydralazine .   2. Bradycardia with pauses: Cardiac monitor in 07/2021 in the setting of syncope showed intermittent second-degree AV block, Mobitz type I, 3 pauses (longest lasting 4.4 seconds), brief episodes of SVT (longest episode lasting 9.5 seconds). He was referred to EP and underwent placement of loop recorder in 11/2021.  Recorder has shown intermittent pauses, longest lasting up to 6 seconds.  He has declined PPM.    3. Recurrent syncope: Likely secondary to neurogenic orthostatic hypotension, bradycardia.  He was evaluated in the ED in March 2025 in the setting of possible syncope.  He denies any recurrent syncope.  4. Hypertension/neurogenic orthostatic hypotension: He continues to note lightheadedness with position changes, denies any recent presyncope or syncope.  BP elevated in office today, given history of neurogenic orthostatic  hypotension, would aim for goal SBP less than 150 mmHg.    5. PAD: S/p L CEA in 2022, known mesenteric disease. Followed by vascular surgery).   6. Hyperlipidemia: LDL was 31 in 04/2021.  Recommend repeat fasting lipids, LFTs with PCP (he has a physical scheduled next month).  Continue Lipitor.  7. Disposition: Follow-up in 6 months, sooner if needed.   HYPERTENSION CONTROL Vitals:   12/10/23 1319 12/10/23 1350  BP: (!) 152/62 (!) 152/64    The patient's blood pressure is elevated above target today.  In order to address the patient's elevated BP: Blood pressure will be monitored at home to determine if medication changes need to be made.      Jude Norton, NP 12/10/2023, 4:57 PM

## 2023-12-10 NOTE — Patient Instructions (Signed)
 Medication Instructions:  Your physician recommends that you continue on your current medications as directed. Please refer to the Current Medication list given to you today.  *If you need a refill on your cardiac medications before your next appointment, please call your pharmacy*  Lab Work: NONE ordered at this time of appointment   Testing/Procedures: NONE ordered at this time of appointment   Follow-Up: At [facility], you and your health needs are our priority.  As part of our continuing mission to provide you with exceptional heart care, our providers are all part of one team.  This team includes your primary Cardiologist (physician) and Advanced Practice Providers or APPs (Physician Assistants and Nurse Practitioners) who all work together to provide you with the care you need, when you need it.  Your next appointment:   6 month(s)  Provider:   Oneil Bigness, MD    We recommend signing up for the patient portal called "MyChart".  Sign up information is provided on this After Visit Summary.  MyChart is used to connect with patients for Virtual Visits (Telemedicine).  Patients are able to view lab/test results, encounter notes, upcoming appointments, etc.  Non-urgent messages can be sent to your provider as well.   To learn more about what you can do with MyChart, go to [email or web address].

## 2024-02-19 ENCOUNTER — Emergency Department (HOSPITAL_COMMUNITY)

## 2024-02-19 ENCOUNTER — Other Ambulatory Visit: Payer: Self-pay

## 2024-02-19 ENCOUNTER — Inpatient Hospital Stay (HOSPITAL_COMMUNITY)
Admission: EM | Admit: 2024-02-19 | Discharge: 2024-02-27 | DRG: 208 | Disposition: A | Discharge status: Skilled Nursing Facility | Attending: Internal Medicine | Admitting: Internal Medicine

## 2024-02-19 DIAGNOSIS — Z820 Family history of epilepsy and other diseases of the nervous system: Secondary | ICD-10-CM | POA: Diagnosis not present

## 2024-02-19 DIAGNOSIS — Z880 Allergy status to penicillin: Secondary | ICD-10-CM

## 2024-02-19 DIAGNOSIS — J690 Pneumonitis due to inhalation of food and vomit: Secondary | ICD-10-CM | POA: Diagnosis present

## 2024-02-19 DIAGNOSIS — Z7982 Long term (current) use of aspirin: Secondary | ICD-10-CM

## 2024-02-19 DIAGNOSIS — T428X5A Adverse effect of antiparkinsonism drugs and other central muscle-tone depressants, initial encounter: Secondary | ICD-10-CM | POA: Diagnosis present

## 2024-02-19 DIAGNOSIS — J9601 Acute respiratory failure with hypoxia: Secondary | ICD-10-CM | POA: Diagnosis present

## 2024-02-19 DIAGNOSIS — D638 Anemia in other chronic diseases classified elsewhere: Secondary | ICD-10-CM | POA: Diagnosis present

## 2024-02-19 DIAGNOSIS — Z809 Family history of malignant neoplasm, unspecified: Secondary | ICD-10-CM

## 2024-02-19 DIAGNOSIS — R001 Bradycardia, unspecified: Secondary | ICD-10-CM | POA: Diagnosis not present

## 2024-02-19 DIAGNOSIS — R4182 Altered mental status, unspecified: Secondary | ICD-10-CM | POA: Diagnosis present

## 2024-02-19 DIAGNOSIS — R410 Disorientation, unspecified: Secondary | ICD-10-CM | POA: Diagnosis present

## 2024-02-19 DIAGNOSIS — T380X5A Adverse effect of glucocorticoids and synthetic analogues, initial encounter: Secondary | ICD-10-CM | POA: Diagnosis present

## 2024-02-19 DIAGNOSIS — Z91013 Allergy to seafood: Secondary | ICD-10-CM

## 2024-02-19 DIAGNOSIS — Z881 Allergy status to other antibiotic agents status: Secondary | ICD-10-CM

## 2024-02-19 DIAGNOSIS — F29 Unspecified psychosis not due to a substance or known physiological condition: Secondary | ICD-10-CM | POA: Diagnosis present

## 2024-02-19 DIAGNOSIS — R443 Hallucinations, unspecified: Secondary | ICD-10-CM | POA: Diagnosis not present

## 2024-02-19 DIAGNOSIS — E785 Hyperlipidemia, unspecified: Secondary | ICD-10-CM | POA: Diagnosis present

## 2024-02-19 DIAGNOSIS — G44229 Chronic tension-type headache, not intractable: Secondary | ICD-10-CM | POA: Diagnosis present

## 2024-02-19 DIAGNOSIS — R451 Restlessness and agitation: Secondary | ICD-10-CM | POA: Diagnosis not present

## 2024-02-19 DIAGNOSIS — G9340 Encephalopathy, unspecified: Secondary | ICD-10-CM

## 2024-02-19 DIAGNOSIS — K219 Gastro-esophageal reflux disease without esophagitis: Secondary | ICD-10-CM | POA: Diagnosis present

## 2024-02-19 DIAGNOSIS — I10 Essential (primary) hypertension: Secondary | ICD-10-CM | POA: Diagnosis present

## 2024-02-19 DIAGNOSIS — I7090 Unspecified atherosclerosis: Secondary | ICD-10-CM | POA: Diagnosis not present

## 2024-02-19 DIAGNOSIS — Z8249 Family history of ischemic heart disease and other diseases of the circulatory system: Secondary | ICD-10-CM

## 2024-02-19 DIAGNOSIS — R471 Dysarthria and anarthria: Secondary | ICD-10-CM | POA: Diagnosis present

## 2024-02-19 DIAGNOSIS — Z79899 Other long term (current) drug therapy: Secondary | ICD-10-CM | POA: Diagnosis not present

## 2024-02-19 DIAGNOSIS — Z8673 Personal history of transient ischemic attack (TIA), and cerebral infarction without residual deficits: Secondary | ICD-10-CM

## 2024-02-19 DIAGNOSIS — E871 Hypo-osmolality and hyponatremia: Secondary | ICD-10-CM | POA: Diagnosis present

## 2024-02-19 DIAGNOSIS — D72829 Elevated white blood cell count, unspecified: Secondary | ICD-10-CM | POA: Diagnosis not present

## 2024-02-19 DIAGNOSIS — R251 Tremor, unspecified: Secondary | ICD-10-CM

## 2024-02-19 DIAGNOSIS — G20A1 Parkinson's disease without dyskinesia, without mention of fluctuations: Secondary | ICD-10-CM | POA: Diagnosis present

## 2024-02-19 DIAGNOSIS — G928 Other toxic encephalopathy: Secondary | ICD-10-CM | POA: Diagnosis present

## 2024-02-19 DIAGNOSIS — I700 Atherosclerosis of aorta: Secondary | ICD-10-CM | POA: Diagnosis present

## 2024-02-19 DIAGNOSIS — I469 Cardiac arrest, cause unspecified: Secondary | ICD-10-CM | POA: Diagnosis not present

## 2024-02-19 DIAGNOSIS — G9341 Metabolic encephalopathy: Secondary | ICD-10-CM | POA: Diagnosis not present

## 2024-02-19 DIAGNOSIS — Z87891 Personal history of nicotine dependence: Secondary | ICD-10-CM

## 2024-02-19 DIAGNOSIS — K117 Disturbances of salivary secretion: Secondary | ICD-10-CM | POA: Diagnosis present

## 2024-02-19 DIAGNOSIS — Z8659 Personal history of other mental and behavioral disorders: Secondary | ICD-10-CM | POA: Diagnosis not present

## 2024-02-19 DIAGNOSIS — I739 Peripheral vascular disease, unspecified: Secondary | ICD-10-CM | POA: Diagnosis not present

## 2024-02-19 DIAGNOSIS — G20B1 Parkinson's disease with dyskinesia, without mention of fluctuations: Secondary | ICD-10-CM | POA: Diagnosis not present

## 2024-02-19 LAB — COMPREHENSIVE METABOLIC PANEL WITH GFR
ALT: <5 U/L (ref 0–44)
AST: 19 U/L (ref 15–41)
Albumin: 4 g/dL (ref 3.5–5.0)
Alkaline Phosphatase: 58 U/L (ref 38–126)
Anion gap: 13 (ref 5–15)
BUN: 15 mg/dL (ref 8–23)
CO2: 20 mmol/L — ABNORMAL LOW (ref 22–32)
Calcium: 8.9 mg/dL (ref 8.9–10.3)
Chloride: 101 mmol/L (ref 98–111)
Creatinine, Ser: 1 mg/dL (ref 0.61–1.24)
GFR, Estimated: >60 mL/min (ref >60)
Glucose, Bld: 103 mg/dL — ABNORMAL HIGH (ref 70–99)
Potassium: 3.9 mmol/L (ref 3.5–5.1)
Sodium: 134 mmol/L — ABNORMAL LOW (ref 135–145)
Total Bilirubin: 1.4 mg/dL — ABNORMAL HIGH (ref 0.0–1.2)
Total Protein: 6.6 g/dL (ref 6.5–8.1)

## 2024-02-19 LAB — CBC
HCT: 34.1 % — ABNORMAL LOW (ref 39.0–52.0)
Hemoglobin: 11.9 g/dL — ABNORMAL LOW (ref 13.0–17.0)
MCH: 29.5 pg (ref 26.0–34.0)
MCHC: 34.9 g/dL (ref 30.0–36.0)
MCV: 84.6 fL (ref 80.0–100.0)
Platelets: 221 K/uL (ref 150–400)
RBC: 4.03 MIL/uL — ABNORMAL LOW (ref 4.22–5.81)
RDW: 14.4 % (ref 11.5–15.5)
WBC: 12 K/uL — ABNORMAL HIGH (ref 4.0–10.5)
nRBC: 0 % (ref 0.0–0.2)

## 2024-02-19 LAB — URINALYSIS, ROUTINE W REFLEX MICROSCOPIC
Bilirubin Urine: NEGATIVE
Glucose, UA: NEGATIVE mg/dL
Hgb urine dipstick: NEGATIVE
Ketones, ur: 5 mg/dL — AB
Leukocytes,Ua: NEGATIVE
Nitrite: NEGATIVE
Protein, ur: NEGATIVE mg/dL
Specific Gravity, Urine: 1.02 (ref 1.005–1.030)
pH: 6 (ref 5.0–8.0)

## 2024-02-19 LAB — CBC WITH DIFFERENTIAL/PLATELET
Abs Immature Granulocytes: 0.08 K/uL — ABNORMAL HIGH (ref 0.00–0.07)
Basophils Absolute: 0 K/uL (ref 0.0–0.1)
Basophils Relative: 0 %
Eosinophils Absolute: 0 K/uL (ref 0.0–0.5)
Eosinophils Relative: 0 %
HCT: 35 % — ABNORMAL LOW (ref 39.0–52.0)
Hemoglobin: 12.2 g/dL — ABNORMAL LOW (ref 13.0–17.0)
Immature Granulocytes: 1 %
Lymphocytes Relative: 20 %
Lymphs Abs: 2.7 K/uL (ref 0.7–4.0)
MCH: 29.2 pg (ref 26.0–34.0)
MCHC: 34.9 g/dL (ref 30.0–36.0)
MCV: 83.7 fL (ref 80.0–100.0)
Monocytes Absolute: 1.2 K/uL — ABNORMAL HIGH (ref 0.1–1.0)
Monocytes Relative: 9 %
Neutro Abs: 9.9 K/uL — ABNORMAL HIGH (ref 1.7–7.7)
Neutrophils Relative %: 70 %
Platelets: 242 K/uL (ref 150–400)
RBC: 4.18 MIL/uL — ABNORMAL LOW (ref 4.22–5.81)
RDW: 14.5 % (ref 11.5–15.5)
WBC: 13.9 K/uL — ABNORMAL HIGH (ref 4.0–10.5)
nRBC: 0 % (ref 0.0–0.2)

## 2024-02-19 LAB — BLOOD GAS, VENOUS
Acid-Base Excess: 3.7 mmol/L — ABNORMAL HIGH (ref 0.0–2.0)
Bicarbonate: 25.9 mmol/L (ref 20.0–28.0)
O2 Saturation: 25.1 %
Patient temperature: 37
pCO2, Ven: 31 mmHg — ABNORMAL LOW (ref 44–60)
pH, Ven: 7.53 — ABNORMAL HIGH (ref 7.25–7.43)
pO2, Ven: <31 mmHg — CL (ref 32–45)

## 2024-02-19 LAB — AMMONIA: Ammonia: <13 umol/L (ref 9–35)

## 2024-02-19 LAB — TROPONIN I (HIGH SENSITIVITY)
Troponin I (High Sensitivity): 11 ng/L (ref <18)
Troponin I (High Sensitivity): 14 ng/L (ref <18)

## 2024-02-19 LAB — RAPID URINE DRUG SCREEN, HOSP PERFORMED
Amphetamines: NOT DETECTED
Barbiturates: NOT DETECTED
Benzodiazepines: NOT DETECTED
Cocaine: NOT DETECTED
Opiates: NOT DETECTED
Tetrahydrocannabinol: NOT DETECTED

## 2024-02-19 LAB — CREATININE, SERUM
Creatinine, Ser: 0.93 mg/dL (ref 0.61–1.24)
GFR, Estimated: >60 mL/min (ref >60)

## 2024-02-19 LAB — C-REACTIVE PROTEIN: CRP: <0.5 mg/dL (ref <1.0)

## 2024-02-19 LAB — CK: Total CK: 77 U/L (ref 49–397)

## 2024-02-19 MED ORDER — SODIUM CHLORIDE 0.9 % IV SOLN: INTRAVENOUS | Status: DC

## 2024-02-19 MED ORDER — ACETAMINOPHEN 325 MG PO TABS: 650.0000 mg | ORAL_TABLET | Freq: Four times a day (QID) | ORAL | Status: DC | PRN

## 2024-02-19 MED ORDER — DEXTROSE 50 % IV SOLN: 1.0000 | Freq: Once | INTRAVENOUS | Status: DC

## 2024-02-19 MED ORDER — CARBIDOPA-LEVODOPA 25-100 MG PO TABS: 2.0000 | ORAL_TABLET | Freq: Three times a day (TID) | ORAL | Status: DC

## 2024-02-19 MED ORDER — DIAZEPAM 5 MG/ML IJ SOLN
2.5000 mg | Freq: Once | INTRAMUSCULAR | Status: AC
  Administered 2024-02-19: 2.5 mg via INTRAVENOUS
  Filled 2024-02-19: qty 2

## 2024-02-19 MED ORDER — CARBIDOPA-LEVODOPA ER 50-200 MG PO TBCR
1.0000 | EXTENDED_RELEASE_TABLET | Freq: Every day | ORAL | Status: DC
  Filled 2024-02-19: qty 1

## 2024-02-19 MED ORDER — IOHEXOL 350 MG/ML SOLN
75.0000 mL | Freq: Once | INTRAVENOUS | Status: AC | PRN
Start: 2024-02-19 — End: 2024-02-19
  Administered 2024-02-19: 75 mL via INTRAVENOUS

## 2024-02-19 MED ORDER — LORAZEPAM 2 MG/ML IJ SOLN
1.0000 mg | Freq: Once | INTRAMUSCULAR | Status: AC
  Administered 2024-02-19: 1 mg via INTRAVENOUS
  Filled 2024-02-19: qty 1

## 2024-02-19 MED ORDER — CARBIDOPA-LEVODOPA 25-100 MG PO TABS
2.5000 | ORAL_TABLET | Freq: Three times a day (TID) | ORAL | Status: DC
  Administered 2024-02-19: 2.5 via ORAL
  Filled 2024-02-19: qty 3

## 2024-02-19 MED ORDER — HEPARIN SODIUM (PORCINE) 5000 UNIT/ML IJ SOLN
5000.0000 [IU] | Freq: Three times a day (TID) | INTRAMUSCULAR | Status: DC
  Administered 2024-02-20 – 2024-02-23 (×11): 5000 [IU] via SUBCUTANEOUS
  Filled 2024-02-19 (×11): qty 1

## 2024-02-19 MED ORDER — CARBIDOPA-LEVODOPA ER 50-200 MG PO TBCR
1.0000 | EXTENDED_RELEASE_TABLET | Freq: Every day | ORAL | Status: DC
  Administered 2024-02-19: 1 via ORAL
  Filled 2024-02-19: qty 1

## 2024-02-19 MED ORDER — CARBIDOPA-LEVODOPA 25-100 MG PO TABS
3.0000 | ORAL_TABLET | Freq: Three times a day (TID) | ORAL | Status: DC
  Administered 2024-02-19: 3 via ORAL
  Filled 2024-02-19: qty 3

## 2024-02-19 MED ORDER — QUETIAPINE FUMARATE 25 MG PO TABS
25.0000 mg | ORAL_TABLET | Freq: Every day | ORAL | Status: DC
  Administered 2024-02-19: 25 mg via ORAL
  Filled 2024-02-19: qty 1

## 2024-02-19 MED ORDER — ALBUTEROL SULFATE (2.5 MG/3ML) 0.083% IN NEBU: 2.5000 mg | INHALATION_SOLUTION | RESPIRATORY_TRACT | Status: DC | PRN

## 2024-02-19 MED ORDER — ONDANSETRON HCL 4 MG/2ML IJ SOLN: 4.0000 mg | Freq: Four times a day (QID) | INTRAMUSCULAR | Status: DC | PRN

## 2024-02-19 MED ORDER — ACETAMINOPHEN 650 MG RE SUPP
650.0000 mg | Freq: Four times a day (QID) | RECTAL | Status: DC | PRN
Start: 2024-02-19 — End: 2024-02-20

## 2024-02-19 MED ORDER — ONDANSETRON HCL 4 MG PO TABS: 4.0000 mg | ORAL_TABLET | Freq: Four times a day (QID) | ORAL | Status: DC | PRN

## 2024-02-19 NOTE — ED Triage Notes (Signed)
 Patient brought in by [facility] for erratic behavior from [facility]. Patient hyperventilating intermittently upon EMS arrival. Intermittent periods of erratic behavior, calmed by nurse in house after taking baclofen. Hx of parkinson's. Upon ems arrival, patient found out mistake ab out prescriptions, carbidopa  levodopa . Patient screaming and ended up on balcony naked while throwing laptop off balcony. Patient endorses not being like this usually, recalls entire event, and doesn't know why this is happening. Patient endorses feeling like he is dying. Alert and oriented x4 with periods of intermittent confusion. Recently started new medication for sleep but unable to name. Decline over past two months but wife endorses this is all new behavior. Patient endorses chronic abdominal pain that was alleviated after bowel movement.   EMS vitals 152/78 BP HR 89 O2 98  Rr 40 CBG 120

## 2024-02-19 NOTE — ED Notes (Signed)
 Patient's wife called by this RN. Patient's wife endorses patient woke up agitated and abnormal. But was normal last night at time of going to bed around 2130.

## 2024-02-19 NOTE — ED Notes (Signed)
 Patient refuses to wear non-slip socks. Fall alarm placed by RN. Fall risk sign on door. Bed locked in lowest position. Family at bedside. Patient's door is open at this time. MD notified.

## 2024-02-19 NOTE — ED Notes (Signed)
 Upon Neuro assessment of pt found to have slight right facial droop. NIH performed. MD notified. CT of head ordered.

## 2024-02-19 NOTE — Consult Note (Signed)
 NEUROLOGY CONSULT NOTE   Date of service: February 19, 2024 Patient Name: Terry [name] MRN:  [id number] DOB:  04-08-1946 Chief Complaint: Confusion and agitation Requesting Provider: Albertina Dixon, MD  History of Present Illness  Terry [name] is a 78 y.o. male with hx of Parkinson's disease, with first symptoms possibly around 2010.  Terry [name] was diagnosed with Parkinson's in 2013.  Terry [name] was started on Sinemet  with notes reporting little improvement, and then started on Neupro  with significant improvement, but dose limited by hallucinations.  Terry [name] began having episodes of passing out in 2019 and also began having short-term memory problems around the same time(the family reports that his short-term memory still seems good to them).  Terry [name] moved from [place] to [place] in 2020 but continued to go see his neurologist in [place] until Terry [name] established with [facility] in May of this year.  Terry [name] has previously been managed on Nuplazid, but was taken off of it due to concerns that it may be contributing to his orthostatic hypotension.  Terry [name] does have Aricept on his list of medications, but states that Terry [name] does not take it.  When Terry [name] was seen in May, Terry [name] was changed from carbidopa  levodopa  to crexont .  At some point in the interim, though unclear when Terry [name] changed back to his Sinemet  as this is what is on the medication list that Terry [name] brings with him.  Of note Terry [name] took his morning medications but has missed all of his other medications until the ER gave him a dose approximately 10 minutes prior to my evaluation.  Terry [name] had an epidural steroid injection on 7/22, and about 4 to 5 days later began having increased agitation and hallucinations.  Terry [name] denies any recent illnesses other than feeling like Terry [name] is constipated.  Past History   Past Medical History:  Diagnosis Date   Carotid artery stenosis 04/29/2018   Chronic tension headaches 04/29/2018   Essential hypertension 04/29/2018   GERD  (gastroesophageal reflux disease) 04/29/2018   HLD (hyperlipidemia) 04/29/2018   Parkinson's disease (HCC) 04/29/2018    Past Surgical History:  Procedure Laterality Date   BIOPSY  01/09/2022   Procedure: BIOPSY;  Surgeon: Wilhelmenia Aloha Raddle., MD;  Location: [facility];  Service: Gastroenterology;;  EGD and COLON   COLONOSCOPY WITH PROPOFOL  N/A 01/09/2022   Procedure: COLONOSCOPY WITH PROPOFOL ;  Surgeon: Wilhelmenia Aloha Raddle., MD;  Location: [facility];  Service: Gastroenterology;  Laterality: N/A;   ENDARTERECTOMY Left 04/27/2021   Procedure: LEFT CAROTID ENDARTERECTOMY;  Surgeon: Serene Gaile ORN, MD;  Location: [facility] OR;  Service: Vascular;  Laterality: Left;   ESOPHAGOGASTRODUODENOSCOPY (EGD) WITH PROPOFOL  N/A 01/09/2022   Procedure: ESOPHAGOGASTRODUODENOSCOPY (EGD) WITH PROPOFOL ;  Surgeon: Wilhelmenia Aloha Raddle., MD;  Location: [facility];  Service: Gastroenterology;  Laterality: N/A;   LEFT HEART CATH AND CORONARY ANGIOGRAPHY N/A 11/25/2019   Procedure: LEFT HEART CATH AND CORONARY ANGIOGRAPHY;  Surgeon: Claudene Victory ORN, MD;  Location: [facility];  Service: Cardiovascular;  Laterality: N/A;   PATCH ANGIOPLASTY Left 04/27/2021   Procedure: PATCH ANGIOPLASTY USING GEORGE BIOLOGIC PATCH;  Surgeon: Serene Gaile ORN, MD;  Location: [facility];  Service: Vascular;  Laterality: Left;   TONSILLECTOMY      Family History: Family History  Problem Relation Age of Onset   Heart disease Mother    Cancer Father    Parkinson's disease Sister    Colon cancer Neg Hx    Stomach cancer Neg Hx    Esophageal cancer Neg Hx  Inflammatory bowel disease Neg Hx    Liver disease Neg Hx    Pancreatic cancer Neg Hx    Rectal cancer Neg Hx     Social History  reports that Terry [name] has never smoked. Terry [name] has never been exposed to tobacco smoke. Terry [name] has quit using smokeless tobacco.  His smokeless tobacco use included snuff. Terry [name] reports current alcohol use. Terry [name] reports that Terry [name] does not use drugs.  Allergies   Allergen Reactions   Penicillins Rash   Erythromycin Rash   Clam Shell Nausea And Vomiting    Medications   Current Facility-Administered Medications:    carbidopa -levodopa  (SINEMET  CR) 50-200 MG per tablet controlled release 1 tablet, 1 tablet, Oral, QHS, Kommor, [name], MD   carbidopa -levodopa  (SINEMET  IR) 25-100 MG per tablet immediate release 2.5 tablet, 2.5 tablet, Oral, TID, Kommor, [name], MD, 2.5 tablet at 02/19/24 1929  Current Outpatient Medications:    acetaminophen  (TYLENOL ) 500 MG tablet, Take 500 mg by mouth every 6 (six) hours as needed for moderate pain or headache., Disp: , Rfl:    aspirin  EC 81 MG EC tablet, Take 1 tablet (81 mg total) by mouth daily. Swallow whole., Disp: 30 tablet, Rfl: 1   atorvastatin  (LIPITOR) 20 MG tablet, TAKE ONE TABLET BY MOUTH EVERY NIGHT AT BEDTIME, Disp: 90 tablet, Rfl: 0   brimonidine-timolol (COMBIGAN) 0.2-0.5 % ophthalmic solution, Apply 1 drop to eye 2 (two) times daily., Disp: , Rfl:    carbidopa -levodopa  (SINEMET  CR) 50-200 MG tablet, Take 1 tablet by mouth at bedtime., Disp: 90 tablet, Rfl: 1   carbidopa -levodopa  (SINEMET  IR) 25-100 MG tablet, Take 2.5 tablets by mouth 3 (three) times daily. Patient also takes 2.5 tablets once daily, Disp: , Rfl:    cetirizine (ZYRTEC) 10 MG tablet, Take 10 mg by mouth as needed for allergies., Disp: , Rfl:    Cholecalciferol (VITAMIN D3) 1.25 MG (50000 UT) CAPS, Take 50,000 Units by mouth once a week., Disp: , Rfl:    Cyanocobalamin (VITAMIN B-12 IJ), Inject 1,000 mcg as directed every 30 (thirty) days., Disp: , Rfl:    fluticasone  (FLONASE ) 50 MCG/ACT nasal spray, Place 1 spray into both nostrils daily as needed for allergies. , Disp: , Rfl:    hydrALAZINE  (APRESOLINE ) 50 MG tablet, TAKE 1 TABLET(50 MG) BY MOUTH TWICE DAILY, Disp: 180 tablet, Rfl: 2   meloxicam  (MOBIC ) 7.5 MG tablet, Take 7.5 mg by mouth 2 (two) times daily as needed., Disp: , Rfl:    NEUPRO  4 MG/24HR, Place 1 patch onto the skin  every morning., Disp: , Rfl:    NUPLAZID 34 MG CAPS, Take 1 capsule by mouth daily., Disp: , Rfl:    olopatadine  (PATADAY ) 0.1 % ophthalmic solution, Place 1 drop into both eyes daily as needed for allergies., Disp: , Rfl:    pantoprazole  (PROTONIX ) 40 MG tablet, TAKE 1 TABLET BY MOUTH EVERY DAY, Disp: 90 tablet, Rfl: 4   Prenatal Vit-Fe Fumarate-FA (M-NATAL PLUS) 27-1 MG TABS, Take 1 tablet by mouth daily., Disp: , Rfl:    QUEtiapine  (SEROQUEL ) 25 MG tablet, Take 25 mg by mouth at bedtime., Disp: , Rfl:    traZODone  (DESYREL ) 50 MG tablet, Take 1 tablet (50 mg total) by mouth at bedtime as needed for sleep. (Patient taking differently: Take 50 mg by mouth at bedtime.), Disp: 90 tablet, Rfl: 0   valsartan  (DIOVAN ) 40 MG tablet, Take 40 mg by mouth 2 (two) times daily., Disp: , Rfl:   Vitals   Vitals:   02/19/24  1615 February 22, 2024 1800  BP: (!) 180/98 (!) 140/101  Pulse: 88 89  Resp: (!) 26 19  Temp: 97.7 F (36.5 C)   SpO2: 100% 100%    There is no height or weight on file to calculate BMI.   Physical Exam   Constitutional: Appears well-developed and well-nourished.  ENT: Neck is supple Neurologic Examination    Neuro: Mental Status: Patient is awake, alert, gives the month is November but able to give the year and location Patient is able to give a clear and coherent history. No signs of aphasia or neglect Cranial Nerves: II: Visual Fields are full. Pupils are equal, round, and reactive to light.   III,IV, VI: EOMI without ptosis or diploplia.  V: Facial sensation is symmetric to temperature VII: Facial movement is symmetric.  VIII: hearing is intact to voice X: Uvula elevates symmetrically XII: tongue is midline without atrophy or fasciculations.  Motor: Terry [name] has good strength throughout, his movements are frequent enough that is difficult to assess tone reliably Sensory: Sensation is symmetric to light touch and temperature in the arms and legs.   Terry [name] has frequent abnormal  movements of the head as well as arms.  I wonder if these could be dyskinesias related to waning effects of his Sinemet  CR, but they almost look like choreoathetosis.       Labs/Imaging/Neurodiagnostic studies   CBC:  Recent Labs  Lab 02-22-2024 1635  WBC 13.9*  NEUTROABS 9.9*  HGB 12.2*  HCT 35.0*  MCV 83.7  PLT 242   Basic Metabolic Panel:  Lab Results  Component Value Date   NA 134 (L) 2024-02-22   K 3.9 02/22/2024   CO2 20 (L) February 22, 2024   GLUCOSE 103 (H) 22-Feb-2024   BUN 15 02/22/24   CREATININE 1.00 February 22, 2024   CALCIUM  8.9 02/22/24   GFRNONAA >60 Feb 22, 2024   GFRAA 85 11/18/2019   Lipid Panel:  Lab Results  Component Value Date   LDLCALC 31 04/28/2021   HgbA1c:  Lab Results  Component Value Date   HGBA1C 5.4 04/24/2021   Urine Drug Screen:     Component Value Date/Time   LABOPIA NONE DETECTED February 22, 2024 1839   COCAINSCRNUR NONE DETECTED February 22, 2024 1839   LABBENZ NONE DETECTED 2024-02-22 1839   AMPHETMU NONE DETECTED 02/22/24 1839   THCU NONE DETECTED 22-Feb-2024 1839   LABBARB NONE DETECTED 02/22/24 1839    CT head-no acute findings  ASSESSMENT   Ladarion Munyon is a 78 y.o. male with increasing agitation/confusion approximately 4 days after epidural steroid injection.  Terry [name] does have a mild leukocytosis, and therefore it is possible that Terry [name] has some underlying mild infectious process that is as yet unidentified causing delirium.  I think it is also possible that this is steroid related delirium in someone with underlying Parkinson's related psychosis.  I think getting him reestablished on his medication regimen and adding low-dose Seroquel  would be a good first step.  RECOMMENDATIONS  Seroquel  25 mg every 8 PM Restart home Sinemet  as per his medication list: Carbidopa -levodopa  CR 50-200 at 9pm Carbidopa -levodopa  IR 25-100 2 tablets at 0900, 1500, 1800 Carbidopa -levodopa  IR 25-100 3 tablets at 0600, 1200, 2100 Infectious screen per ED/IM for  source of leukocytosis Neurology will follow.  ______________________________________________________________________  Bonney Aisha Seals, MD [facility]

## 2024-02-19 NOTE — ED Notes (Signed)
 CCMD confirmed patient on cardiac telemetry

## 2024-02-19 NOTE — ED Provider Notes (Signed)
 [place] EMERGENCY DEPARTMENT AT [facility] Provider Note  CSN: [id number] Arrival date & time: 02/19/24 1556  Chief Complaint(s) Altered Mental Status  HPI Terry [name] is a 78 y.o. male with PMH Parkinson's disease, GERD, HTN who presents emerged part for evaluation of abnormal behavior.  History obtained from patient's wife who states that he was previously on carbidopa  levodopa  for a very long time but symptoms started to worsen so they transitioned him to a different medication.  He had some side effects from this medication and reverted back to his carbidopa  levodopa  yesterday.  This morning patient was very agitated and aggressive throwing objects off his balcony.  His wife states this is very abnormal behavior for him.  Wife also states that he has a very mild baseline tremor but is been tremoring heavily during this episode.  Here in the emerged part more, patient is agitated stating that he feels like he needs to use the restroom and is frequently stating he feels like I am going to die.  Endorsing some intermittent abdominal pain and some chest pain but symptoms are fairly diffuse and patient is very anxious here in the ER.   Past Medical History Past Medical History:  Diagnosis Date   Carotid artery stenosis 04/29/2018   Chronic tension headaches 04/29/2018   Essential hypertension 04/29/2018   GERD (gastroesophageal reflux disease) 04/29/2018   HLD (hyperlipidemia) 04/29/2018   Parkinson's disease (HCC) 04/29/2018   Patient Active Problem List   Diagnosis Date Noted   Delirium 02/19/2024   Lower abdominal pain 11/11/2021   Irritable bowel syndrome with both constipation and diarrhea 11/11/2021   Change in bowel habits 11/11/2021   History of iron deficiency 11/11/2021   Constipation 09/09/2021   Gastroesophageal reflux disease 09/09/2021   Anemia 09/09/2021   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   Carotid artery occlusion 06/14/2021   Stroke (HCC) 04/24/2021   Headache 04/24/2021   Hypertensive crisis 04/23/2021   Chest tightness 10/09/2019   Shortness of breath 10/09/2019   Erectile dysfunction 03/12/2019   Essential hypertension 04/29/2018   Need for immunization against influenza 04/29/2018   Parkinson's disease (HCC) 04/29/2018   Carotid artery stenosis 04/29/2018   HLD (hyperlipidemia) 04/29/2018   GERD (gastroesophageal reflux disease) 04/29/2018   Colon cancer screening 04/29/2018   Chronic tension headaches 04/29/2018   Murmur, cardiac 02/12/2018   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 Authorizing Provider  baclofen (LIORESAL) 10 MG tablet Take 5-10 mg by mouth 3 (three) times daily as needed for muscle spasms. 02/19/24  Yes [provider]  cyanocobalamin (VITAMIN B12) 1000 MCG/ML injection Inject 1,000 mcg into the muscle every 30 (thirty) days. 02/14/24  Yes [provider]  Ferrous Sulfate (IRON PO) Take 1 tablet by mouth daily. 08/23/22  Yes [provider]  methocarbamol (ROBAXIN) 500 MG tablet Take 500 mg by mouth 2 (two) times daily as needed. 12/10/23  Yes [provider]  ondansetron  (ZOFRAN ) 4 MG tablet Take 4 mg by mouth every 8 (eight) hours as needed. 12/20/23  Yes [provider]  polyethylene glycol powder (GLYCOLAX /MIRALAX ) 17 GM/SCOOP powder Take 17 g by mouth daily. 12/20/23  Yes [provider]  Vitamin D, Ergocalciferol, (DRISDOL) 1.25 MG (50000 UNIT) CAPS capsule Take 50,000  Units by mouth once a week. 01/02/24  Yes [provider]  acetaminophen  (TYLENOL ) 500 MG tablet Take 500 mg by mouth every 6 (six) hours as needed for moderate pain or headache.    [provider]  aspirin  EC 81 MG EC  tablet Take 1 tablet (81 mg total) by mouth daily. Swallow whole. 04/29/21   Vernon Ranks, MD  atorvastatin  (LIPITOR) 20 MG tablet TAKE ONE TABLET BY MOUTH EVERY NIGHT AT BEDTIME 05/19/20   Bloomfield, Carley D, DO  brimonidine-timolol (COMBIGAN) 0.2-0.5 % ophthalmic solution Apply 1 drop to eye 2 (two) times daily. 05/16/22   [provider]  carbidopa -levodopa  (SINEMET  CR) 50-200 MG tablet Take 1 tablet by mouth at bedtime. 07/29/18   Lovey Satterfield, MD  carbidopa -levodopa  (SINEMET  IR) 25-100 MG tablet Take 2.5 tablets by mouth 3 (three) times daily. Patient also takes 2.5 tablets once daily    [provider]  cetirizine (ZYRTEC) 10 MG tablet Take 10 mg by mouth as needed for allergies.    [provider]  Cholecalciferol (VITAMIN D3) 1.25 MG (50000 UT) CAPS Take 50,000 Units by mouth once a week.    [provider]  Cyanocobalamin (VITAMIN B-12 IJ) Inject 1,000 mcg as directed every 30 (thirty) days.    [provider]  fluticasone  (FLONASE ) 50 MCG/ACT nasal spray Place 1 spray into both nostrils daily as needed for allergies.     [provider]  hydrALAZINE  (APRESOLINE ) 50 MG tablet TAKE 1 TABLET(50 MG) BY MOUTH TWICE DAILY 11/06/23   O'Neal, Darryle Ned, MD  meloxicam  (MOBIC ) 7.5 MG tablet Take 7.5 mg by mouth 2 (two) times daily as needed. 11/07/22   [provider]  NEUPRO  4 MG/24HR Place 1 patch onto the skin every morning. 04/19/21   [provider]  olopatadine  (PATADAY ) 0.1 % ophthalmic solution Place 1 drop into both eyes daily as needed for allergies.    [provider]  pantoprazole  (PROTONIX ) 40 MG tablet TAKE 1 TABLET BY MOUTH EVERY DAY 09/27/22   Mansouraty, Aloha Raddle., MD  Prenatal Vit-Fe Fumarate-FA (M-NATAL PLUS) 27-1 MG TABS Take 1 tablet by mouth daily. 08/23/22   [provider]  traZODone  (DESYREL ) 50 MG tablet Take 1 tablet (50 mg total) by mouth at bedtime as needed for sleep. Patient  taking differently: Take 50 mg by mouth at bedtime. 09/03/18   Lovey Satterfield, MD                                                                                                                                    Past Surgical History Past Surgical History:  Procedure Laterality Date   BIOPSY  01/09/2022   Procedure: BIOPSY;  Surgeon: Wilhelmenia Aloha Raddle., MD;  Location: [facility];  Service: Gastroenterology;;  EGD and COLON   COLONOSCOPY WITH PROPOFOL  N/A 01/09/2022   Procedure: COLONOSCOPY WITH PROPOFOL ;  Surgeon: Wilhelmenia Aloha Raddle., MD;  Location: [facility];  Service: Gastroenterology;  Laterality: N/A;   ENDARTERECTOMY Left 04/27/2021   Procedure: LEFT CAROTID ENDARTERECTOMY;  Surgeon: Serene Gaile ORN, MD;  Location: [facility] OR;  Service: Vascular;  Laterality: Left;   ESOPHAGOGASTRODUODENOSCOPY (EGD) WITH PROPOFOL  N/A 01/09/2022   Procedure: ESOPHAGOGASTRODUODENOSCOPY (EGD) WITH PROPOFOL ;  Surgeon: Wilhelmenia Aloha Raddle., MD;  Location: [facility];  Service: Gastroenterology;  Laterality: N/A;   LEFT HEART CATH AND CORONARY ANGIOGRAPHY N/A 11/25/2019   Procedure: LEFT HEART CATH AND CORONARY ANGIOGRAPHY;  Surgeon: Claudene Victory ORN, MD;  Location: [facility];  Service: Cardiovascular;  Laterality: N/A;   PATCH ANGIOPLASTY Left 04/27/2021   Procedure: PATCH ANGIOPLASTY USING GEORGE BIOLOGIC PATCH;  Surgeon: Serene Gaile ORN, MD;  Location: [facility] OR;  Service: Vascular;  Laterality: Left;   TONSILLECTOMY     Family History Family History  Problem Relation Age of Onset   Heart disease Mother    Cancer Father    Parkinson's disease Sister    Colon cancer Neg Hx    Stomach cancer Neg Hx    Esophageal cancer Neg Hx    Inflammatory bowel disease Neg Hx    Liver disease Neg Hx    Pancreatic cancer Neg Hx    Rectal cancer Neg Hx     Social History Social History   Tobacco Use   Smoking status: Never    Passive exposure: Never   Smokeless tobacco: Former    Types:  Snuff  Vaping Use   Vaping status: Never Used  Substance Use Topics   Alcohol use: Yes    Comment: Occasionally.   Drug use: Never   Allergies Erythromycin, Penicillins, Azithromycin, Clam shell, and Oyster extract  Review of Systems Review of Systems  Constitutional:  Positive for fatigue.  Cardiovascular:  Positive for chest pain.  Gastrointestinal:  Positive for abdominal pain.  Psychiatric/Behavioral:  The patient is nervous/anxious.     Physical Exam Vital Signs  I have reviewed the triage vital signs BP (!) 134/92   Pulse 89   Temp 98.6 F (37 C) (Axillary)   Resp (!) 22   SpO2 97%   Physical Exam Constitutional:      General: He is in acute distress.     Appearance: Normal appearance.  HENT:     Head: Normocephalic and atraumatic.     Nose: No congestion or rhinorrhea.  Eyes:     General:        Right eye: No discharge.        Left eye: No discharge.     Extraocular Movements: Extraocular movements intact.     Pupils: Pupils are equal, round, and reactive to light.  Cardiovascular:     Rate and Rhythm: Normal rate and regular rhythm.     Heart sounds: No murmur heard. Pulmonary:     Effort: No respiratory distress.     Breath sounds: No wheezing or rales.  Abdominal:     General: There is no distension.     Tenderness: There is no abdominal tenderness.  Musculoskeletal:        General: Normal range of motion.     Cervical back: Normal range of motion.  Skin:    General: Skin is warm and dry.  Neurological:     General: No focal deficit present.     Mental Status: He is alert.  Psychiatric:     Comments: Agitated     ED Results and Treatments Labs (all labs ordered are listed, but only abnormal results  are displayed) Labs Reviewed  COMPREHENSIVE METABOLIC PANEL WITH GFR - Abnormal; Notable for the following components:      Result Value   Sodium 134 (*)    CO2 20 (*)    Glucose, Bld 103 (*)    Total Bilirubin 1.4 (*)    All other  components within normal limits  CBC WITH DIFFERENTIAL/PLATELET - Abnormal; Notable for the following components:   WBC 13.9 (*)    RBC 4.18 (*)    Hemoglobin 12.2 (*)    HCT 35.0 (*)    Neutro Abs 9.9 (*)    Monocytes Absolute 1.2 (*)    Abs Immature Granulocytes 0.08 (*)    All other components within normal limits  URINALYSIS, ROUTINE W REFLEX MICROSCOPIC - Abnormal; Notable for the following components:   Ketones, ur 5 (*)    All other components within normal limits  RAPID URINE DRUG SCREEN, HOSP PERFORMED  CK  AMMONIA  BLOOD GAS, VENOUS  C-REACTIVE PROTEIN  SEDIMENTATION RATE  PROCALCITONIN  CBC  CREATININE, SERUM  TSH  COMPREHENSIVE METABOLIC PANEL WITH GFR  CBC  TROPONIN I (HIGH SENSITIVITY)  TROPONIN I (HIGH SENSITIVITY)                                                                                                                          Radiology CT Head Wo Contrast Result Date: 02/19/2024 CLINICAL DATA:  Provided history: Delirium. EXAM: CT HEAD WITHOUT CONTRAST TECHNIQUE: Contiguous axial images were obtained from the base of the skull through the vertex without intravenous contrast. RADIATION DOSE REDUCTION: This exam was performed according to the departmental dose-optimization program which includes automated exposure control, adjustment of the mA and/or kV according to patient size and/or use of iterative reconstruction technique. COMPARISON:  CT angiogram head/neck 01/07/2024. FINDINGS: Motion degraded examination. Within this limitation, findings are as follows. Brain: Generalized cerebral atrophy. There is no acute intracranial hemorrhage. No demarcated cortical infarct. No extra-axial fluid collection. No evidence of an intracranial mass. No midline shift. Vascular: No hyperdense vessel. Atherosclerotic calcifications. Skull: No calvarial fracture or aggressive osseous lesion. Sinuses/Orbits: No mass or acute finding within the imaged orbits. No significant  paranasal sinus disease. Other: Trace fluid within left mastoid air cells. IMPRESSION: 1. Motion degraded examination. Within this limitation, no acute intracranial abnormality is identified. 2. Generalized cerebral atrophy. 3. Trace left mastoid effusion. Electronically Signed   By: Rockey Childs D.O.   On: 02/19/2024 18:23   CT Angio Chest/Abd/Pel for Dissection W and/or Wo Contrast Result Date: 02/19/2024 CLINICAL DATA:  Acute aortic syndrome suspected. Patient has hyperventilating with erratic behavior. EXAM: CT ANGIOGRAPHY CHEST, ABDOMEN AND PELVIS TECHNIQUE: Non-contrast CT of the chest was initially obtained. Multidetector CT imaging through the chest, abdomen and pelvis was performed using the standard protocol during bolus administration of intravenous contrast. Multiplanar reconstructed images and MIPs were obtained and reviewed to evaluate the vascular anatomy. RADIATION DOSE REDUCTION: This exam was performed according to the departmental  dose-optimization program which includes automated exposure control, adjustment of the mA and/or kV according to patient size and/or use of iterative reconstruction technique. CONTRAST:  75mL OMNIPAQUE  IOHEXOL  350 MG/ML SOLN COMPARISON:  Chest radiograph 10/10/2023. CT abdomen and pelvis 01/07/2024. CTA abdomen and pelvis 07/19/2021 FINDINGS: CTA CHEST FINDINGS Cardiovascular: Unenhanced images of the chest demonstrate calcification in the aorta and coronary arteries. No evidence of acute intramural hematoma. Images obtained during the arterial phase after contrast material administration demonstrate normal caliber thoracic aorta. Motion artifact is present but there is no evidence of aortic dissection. Great vessel origins are patent. Normal heart size. No pericardial effusions. Central pulmonary arteries appear patent without evidence of central pulmonary embolus. Mediastinum/Nodes: No enlarged mediastinal, hilar, or axillary lymph nodes. Thyroid  gland, trachea, and  esophagus demonstrate no significant findings. Loop recorder in the left anterior chest wall. Lungs/Pleura: Calcified granuloma in the lingula. Lungs are otherwise clear. No pleural effusion or pneumothorax. Musculoskeletal: Degenerative changes in the spine. No acute bony abnormalities are seen. Motion artifact limits evaluation. Review of the MIP images confirms the above findings. CTA ABDOMEN AND PELVIS FINDINGS VASCULAR Aorta: Normal caliber aorta without aneurysm, dissection, vasculitis or significant stenosis. Calcification of the aorta. Celiac: Motion artifact limits evaluation but the skin back axis appears patent despite calcification at the origin. SMA: Motion artifact limits evaluation but the superior mesenteric artery appears patent despite calcification at the origin. Renals: Duplicated right and single left renal arteries appear patent. Calcification at the origin. IMA: Patent without evidence of aneurysm, dissection, vasculitis or significant stenosis. Inflow: Patent without evidence of aneurysm, dissection, vasculitis or significant stenosis. Veins: No obvious venous abnormality within the limitations of this arterial phase study. Review of the MIP images confirms the above findings. NON-VASCULAR Hepatobiliary: No focal liver abnormality is seen. No gallstones, gallbladder wall thickening, or biliary dilatation. Pancreas: Unremarkable. No pancreatic ductal dilatation or surrounding inflammatory changes. Spleen: Normal in size without focal abnormality. Adrenals/Urinary Tract: Adrenal glands are unremarkable. Kidneys are normal, without renal calculi, focal lesion, or hydronephrosis. Bladder is unremarkable. Stomach/Bowel: Stomach, small bowel, and colon are not abnormally distended. No wall thickening or inflammatory changes are seen. Appendix is not seen. Lymphatic: No significant lymphadenopathy. Reproductive: Prostate is unremarkable. Other: No free air or free fluid in the abdomen. Abdominal  wall musculature appears intact. Musculoskeletal: Degenerative changes in the spine. No acute bony abnormalities. Review of the MIP images confirms the above findings. IMPRESSION: 1. Motion artifact limits examination. 2. No evidence of aneurysm, dissection, or significant stenosis in the thoracic or abdominal aorta. Aortic atherosclerosis. 3. No significant central pulmonary embolus is identified. 4. No evidence of active pulmonary disease. 5. No acute process demonstrated in the abdomen or pelvis. Electronically Signed   By: Elsie Gravely M.D.   On: 02/19/2024 18:13    Pertinent labs & imaging results that were available during my care of the patient were reviewed by me and considered in my medical decision making (see MDM for details).  Medications Ordered in ED Medications  QUEtiapine  (SEROQUEL ) tablet 25 mg (25 mg Oral Given 02/19/24 2113)  carbidopa -levodopa  (SINEMET  IR) 25-100 MG per tablet immediate release 3 tablet (3 tablets Oral Given 02/19/24 2107)  carbidopa -levodopa  (SINEMET  IR) 25-100 MG per tablet immediate release 2 tablet (has no administration in time range)  carbidopa -levodopa  (SINEMET  CR) 50-200 MG per tablet controlled release 1 tablet (has no administration in time range)  heparin  injection 5,000 Units (has no administration in time range)  0.9 %  sodium chloride  infusion (  has no administration in time range)  ondansetron  (ZOFRAN ) tablet 4 mg (has no administration in time range)    Or  ondansetron  (ZOFRAN ) injection 4 mg (has no administration in time range)  acetaminophen  (TYLENOL ) tablet 650 mg (has no administration in time range)    Or  acetaminophen  (TYLENOL ) suppository 650 mg (has no administration in time range)  albuterol  (PROVENTIL ) (2.5 MG/3ML) 0.083% nebulizer solution 2.5 mg (has no administration in time range)  diazepam  (VALIUM ) injection 2.5 mg (2.5 mg Intravenous Given 02/19/24 1655)  iohexol  (OMNIPAQUE ) 350 MG/ML injection 75 mL (75 mLs Intravenous  Contrast Given 02/19/24 1803)  LORazepam  (ATIVAN ) injection 1 mg (1 mg Intravenous Given 02/19/24 1822)  diazepam  (VALIUM ) injection 2.5 mg (2.5 mg Intravenous Given 02/19/24 1929)                                                                                                                                     Procedures Procedures  (including critical care time)  Medical Decision Making / ED Course   This patient presents to the ED for concern of agitation, abnormal behavior, chest pain, abdominal pain, this involves an extensive number of treatment options, and is a complaint that carries with it a high risk of complications and morbidity.  The differential diagnosis includes dopaminergic medication withdrawal, dissection, sepsis, psychosis, electrolyte abnormality  MDM: Patient seen emerged part for evaluation of agitation and tremoring.  Physical exam reveals a very uncomfortable appearing patient having a difficult time finding a comfortable place in the bed, tremoring heavily with some mild tenderness in the abdomen.  Nursing staff states that they noticed a mild facial droop but his neurologic exam is otherwise unremarkable from my exam.  I do not see a facial droop on my exam.  Laboratory evaluation with a leukocytosis of 13.9, hemoglobin 12.2 but otherwise unremarkable.  UDS, CK and urinalysis unremarkable.  Troponin negative.  On my reevaluation, patient states that he feels like his symptoms are worsening and he is frequently repeating the phrase I feel like I am going to die.  He was taken emergently for CT imaging including CT head and CT dissection study which was reassuringly negative for acute pathology.  Patient received multiple doses of benzodiazepines with ultimate improvement of his symptoms.  Spoke with the neurologist on-call Dr. Michaela who came to evaluate the patient and states that this will likely take a few days to get his Sinemet  therapeutic and would benefit from  possible admission.  Also concerned that he may be experiencing steroid-induced psychosis as patient received a recent steroid injection.  Patient admitted   Additional history obtained: -Additional history obtained from multiple family members -External records from outside source obtained and reviewed including: Chart review including previous notes, labs, imaging, consultation notes   Lab Tests: -I ordered, reviewed, and interpreted labs.   The pertinent results include:   Labs Reviewed  COMPREHENSIVE METABOLIC PANEL WITH  GFR - Abnormal; Notable for the following components:      Result Value   Sodium 134 (*)    CO2 20 (*)    Glucose, Bld 103 (*)    Total Bilirubin 1.4 (*)    All other components within normal limits  CBC WITH DIFFERENTIAL/PLATELET - Abnormal; Notable for the following components:   WBC 13.9 (*)    RBC 4.18 (*)    Hemoglobin 12.2 (*)    HCT 35.0 (*)    Neutro Abs 9.9 (*)    Monocytes Absolute 1.2 (*)    Abs Immature Granulocytes 0.08 (*)    All other components within normal limits  URINALYSIS, ROUTINE W REFLEX MICROSCOPIC - Abnormal; Notable for the following components:   Ketones, ur 5 (*)    All other components within normal limits  RAPID URINE DRUG SCREEN, HOSP PERFORMED  CK  AMMONIA  BLOOD GAS, VENOUS  C-REACTIVE PROTEIN  SEDIMENTATION RATE  PROCALCITONIN  CBC  CREATININE, SERUM  TSH  COMPREHENSIVE METABOLIC PANEL WITH GFR  CBC  TROPONIN I (HIGH SENSITIVITY)  TROPONIN I (HIGH SENSITIVITY)      EKG   EKG Interpretation Date/Time:  Tuesday February 19 2024 16:23:23 EDT Ventricular Rate:  87 PR Interval:  217 QRS Duration:  91 QT Interval:  381 QTC Calculation: 459 R Axis:   78  Text Interpretation: Sinus rhythm Borderline prolonged PR interval Probable left atrial enlargement Confirmed by Kathya Wilz ([id number]) on 02/19/2024 10:59:35 PM         Imaging Studies ordered: I ordered imaging studies including CT head, CT dissection  study I independently visualized and interpreted imaging. I agree with the radiologist interpretation   Medicines ordered and prescription drug management: Meds ordered this encounter  Medications   diazepam  (VALIUM ) injection 2.5 mg   iohexol  (OMNIPAQUE ) 350 MG/ML injection 75 mL   LORazepam  (ATIVAN ) injection 1 mg   diazepam  (VALIUM ) injection 2.5 mg   DISCONTD: carbidopa -levodopa  (SINEMET  IR) 25-100 MG per tablet immediate release 2.5 tablet   DISCONTD: carbidopa -levodopa  (SINEMET  CR) 50-200 MG per tablet controlled release 1 tablet   DISCONTD: dextrose  50 % solution 50 mL   QUEtiapine  (SEROQUEL ) tablet 25 mg   carbidopa -levodopa  (SINEMET  IR) 25-100 MG per tablet immediate release 3 tablet   carbidopa -levodopa  (SINEMET  IR) 25-100 MG per tablet immediate release 2 tablet   carbidopa -levodopa  (SINEMET  CR) 50-200 MG per tablet controlled release 1 tablet   heparin  injection 5,000 Units   0.9 %  sodium chloride  infusion   OR Linked Order Group    ondansetron  (ZOFRAN ) tablet 4 mg    ondansetron  (ZOFRAN ) injection 4 mg   OR Linked Order Group    acetaminophen  (TYLENOL ) tablet 650 mg    acetaminophen  (TYLENOL ) suppository 650 mg   albuterol  (PROVENTIL ) (2.5 MG/3ML) 0.083% nebulizer solution 2.5 mg    -I have reviewed the patients home medicines and have made adjustments as needed  Critical interventions none  Consultations Obtained: I requested consultation with the neurologist on-call Dr. Michaela,  and discussed lab and imaging findings as well as pertinent plan - they recommend: Sinemet , benzodiazepines, avoid Haldol    Cardiac Monitoring: The patient was maintained on a cardiac monitor.  I personally viewed and interpreted the cardiac monitored which showed an underlying rhythm of: NSR  Social Determinants of Health:  Factors impacting patients care include: Lives in assisted living   Reevaluation: After the interventions noted above, I reevaluated the patient and  found that they have :improved  Co morbidities  that complicate the patient evaluation  Past Medical History:  Diagnosis Date   Carotid artery stenosis 04/29/2018   Chronic tension headaches 04/29/2018   Essential hypertension 04/29/2018   GERD (gastroesophageal reflux disease) 04/29/2018   HLD (hyperlipidemia) 04/29/2018   Parkinson's disease (HCC) 04/29/2018      Dispostion: I considered admission for this patient, and patient require hospital admission for delirium     Final Clinical Impression(s) / ED Diagnoses Final diagnoses:  Delirium  Involuntary trembling     @PCDICTATION @    Albertina Dixon, MD 02/19/24 2300

## 2024-02-19 NOTE — H&P (Incomplete)
 History and Physical    [name] FMW:[id number] DOB: 1946/04/07 DOA: 02/19/2024  PCP: Burney Darice CROME, MD  Patient coming from: ALF  I have personally briefly reviewed patient's old medical records in [facility]  Chief Complaint: erratic behavior   HPI: Terry [name] is a 78 y.o. male with medical history significant of Carotid artery stenosis, Chronic tension HA , HTN , GERD, HLD, Parkinson's disease, CVA, IBS-C, who presents to ED BIB EMS due to erratic behavior from ALF. Per notes patient was on balcony naked throwing objects of balcony. Patient on arrival by EMS was noted to be calm after he was given baclofen, he was also alert and oriented x 4 . Patient aware of event but not able to say why it happened.  Per notes patient had new sleep medication and recent steroid inject. Wife states no prior activity like this in the past. Patient currently in ED without continued restless.  Patient is alert but very restless. NO hx  of fever/v/d/ but has had constipation and some abdominal but this was relieved with bowel movement per notes.    ED Course:  Patient in ED  treated with valium  , haldol  , seroquel  w/o improvement in restlessness.  Patient was seen by neurology who recommended seroquel   25mg  po q8h.  Patient CTH/CTPE  infectious work up unrevealing. Patient is admitted for further evaluation and treatment of what appears to   Afeb bp 180/90 , rr 26, hr 88 sat 100%  EKG: nsr no hyperacute st -twave changes Wbc 13.9, hgb 12.2, plt 242,  Na 134, K 3.9, Cl 101, bicarb 20, glu 103,  CE11 Valium  : 2.5  CTH IMPRESSION: 1. Motion degraded examination. Within this limitation, no acute intracranial abnormality is identified. 2. Generalized cerebral atrophy. 3. Trace left mastoid effusion  CTPE IMPRESSION: 1. Motion artifact limits examination. 2. No evidence of aneurysm, dissection, or significant stenosis in the thoracic or abdominal aorta. Aortic atherosclerosis. 3. No  significant central pulmonary embolus is identified. 4. No evidence of active pulmonary disease. 5. No acute process demonstrated in the abdomen or pelvis.  UA: neg UDS: neg   Tx sinemet , valium  2.5,seroquel  Review of Systems: As per HPI otherwise 10 point review of systems negative.   Past Medical History:  Diagnosis Date   Carotid artery stenosis 04/29/2018   Chronic tension headaches 04/29/2018   Essential hypertension 04/29/2018   GERD (gastroesophageal reflux disease) 04/29/2018   HLD (hyperlipidemia) 04/29/2018   Parkinson's disease (HCC) 04/29/2018    Past Surgical History:  Procedure Laterality Date   BIOPSY  01/09/2022   Procedure: BIOPSY;  Surgeon: Wilhelmenia Aloha Raddle., MD;  Location: [facility];  Service: Gastroenterology;;  EGD and COLON   COLONOSCOPY WITH PROPOFOL  N/A 01/09/2022   Procedure: COLONOSCOPY WITH PROPOFOL ;  Surgeon: Wilhelmenia Aloha Raddle., MD;  Location: [facility];  Service: Gastroenterology;  Laterality: N/A;   ENDARTERECTOMY Left 04/27/2021   Procedure: LEFT CAROTID ENDARTERECTOMY;  Surgeon: Serene Gaile ORN, MD;  Location: [facility];  Service: Vascular;  Laterality: Left;   ESOPHAGOGASTRODUODENOSCOPY (EGD) WITH PROPOFOL  N/A 01/09/2022   Procedure: ESOPHAGOGASTRODUODENOSCOPY (EGD) WITH PROPOFOL ;  Surgeon: Wilhelmenia Aloha Raddle., MD;  Location: [facility];  Service: Gastroenterology;  Laterality: N/A;   LEFT HEART CATH AND CORONARY ANGIOGRAPHY N/A 11/25/2019   Procedure: LEFT HEART CATH AND CORONARY ANGIOGRAPHY;  Surgeon: Claudene Victory ORN, MD;  Location: [facility];  Service: Cardiovascular;  Laterality: N/A;   PATCH ANGIOPLASTY Left 04/27/2021   Procedure: PATCH ANGIOPLASTY USING  GEORGE BIOLOGIC PATCH;  Surgeon: Serene Gaile ORN, MD;  Location: [facility] OR;  Service: Vascular;  Laterality: Left;   TONSILLECTOMY       reports that he has never smoked. He has never been exposed to tobacco smoke. He has quit using smokeless tobacco.  His smokeless tobacco use  included snuff. He reports current alcohol use. He reports that he does not use drugs.  Allergies  Allergen Reactions   Penicillins Rash   Erythromycin Rash   Clam Shell Nausea And Vomiting    Family History  Problem Relation Age of Onset   Heart disease Mother    Cancer Father    Parkinson's disease Sister    Colon cancer Neg Hx    Stomach cancer Neg Hx    Esophageal cancer Neg Hx    Inflammatory bowel disease Neg Hx    Liver disease Neg Hx    Pancreatic cancer Neg Hx    Rectal cancer Neg Hx     Prior to Admission medications   Medication Sig Start Date End Date Taking? Authorizing Provider  acetaminophen  (TYLENOL ) 500 MG tablet Take 500 mg by mouth every 6 (six) hours as needed for moderate pain or headache.    [provider]  aspirin  EC 81 MG EC tablet Take 1 tablet (81 mg total) by mouth daily. Swallow whole. 04/29/21   Pahwani, Ravi, MD  atorvastatin  (LIPITOR) 20 MG tablet TAKE ONE TABLET BY MOUTH EVERY NIGHT AT BEDTIME 05/19/20   Bloomfield, Carley D, DO  brimonidine-timolol (COMBIGAN) 0.2-0.5 % ophthalmic solution Apply 1 drop to eye 2 (two) times daily. 05/16/22   [provider]  carbidopa -levodopa  (SINEMET  CR) 50-200 MG tablet Take 1 tablet by mouth at bedtime. 07/29/18   Lovey Satterfield, MD  carbidopa -levodopa  (SINEMET  IR) 25-100 MG tablet Take 2.5 tablets by mouth 3 (three) times daily. Patient also takes 2.5 tablets once daily    [provider]  cetirizine (ZYRTEC) 10 MG tablet Take 10 mg by mouth as needed for allergies.    [provider]  Cholecalciferol (VITAMIN D3) 1.25 MG (50000 UT) CAPS Take 50,000 Units by mouth once a week.    [provider]  Cyanocobalamin (VITAMIN B-12 IJ) Inject 1,000 mcg as directed every 30 (thirty) days.    [provider]  fluticasone  (FLONASE ) 50 MCG/ACT nasal spray Place 1 spray into both nostrils daily as needed for allergies.     [provider]  hydrALAZINE   (APRESOLINE ) 50 MG tablet TAKE 1 TABLET(50 MG) BY MOUTH TWICE DAILY 11/06/23   O'Neal, Darryle Ned, MD  meloxicam  (MOBIC ) 7.5 MG tablet Take 7.5 mg by mouth 2 (two) times daily as needed. 11/07/22   [provider]  NEUPRO  4 MG/24HR Place 1 patch onto the skin every morning. 04/19/21   [provider]  NUPLAZID 34 MG CAPS Take 1 capsule by mouth daily. 10/26/22   [provider]  olopatadine  (PATADAY ) 0.1 % ophthalmic solution Place 1 drop into both eyes daily as needed for allergies.    [provider]  pantoprazole  (PROTONIX ) 40 MG tablet TAKE 1 TABLET BY MOUTH EVERY DAY 09/27/22   Mansouraty, Aloha Raddle., MD  Prenatal Vit-Fe Fumarate-FA (M-NATAL PLUS) 27-1 MG TABS Take 1 tablet by mouth daily. 08/23/22   [provider]  QUEtiapine  (SEROQUEL ) 25 MG tablet Take 25 mg by mouth at bedtime.    [provider]  traZODone  (DESYREL ) 50 MG tablet Take 1 tablet (50 mg total) by mouth at bedtime as  needed for sleep. Patient taking differently: Take 50 mg by mouth at bedtime. 09/03/18   Lovey Satterfield, MD  valsartan  (DIOVAN ) 40 MG tablet Take 40 mg by mouth 2 (two) times daily.    [provider]    Physical Exam: Vitals:   02/19/24 1615 02/19/24 1800 02/19/24 1935 02/19/24 2111  BP: (!) 180/98 (!) 140/101 (!) 134/92   Pulse: 88 89 89   Resp: (!) 26 19 (!) 22   Temp: 97.7 F (36.5 C)   98.6 F (37 C)  TempSrc:    Axillary  SpO2: 100% 100% 97%     Constitutional: restless Vitals:   02/19/24 1615 02/19/24 1800 02/19/24 1935 02/19/24 2111  BP: (!) 180/98 (!) 140/101 (!) 134/92   Pulse: 88 89 89   Resp: (!) 26 19 (!) 22   Temp: 97.7 F (36.5 C)   98.6 F (37 C)  TempSrc:    Axillary  SpO2: 100% 100% 97%    Eyes: pupils equal, lids and conjunctivae normal ENMT: Mucous membranes are moist.  Neck: normal, supple, no masses, no thyromegaly Respiratory: clear to auscultation bilaterally, no wheezing, no crackles. Normal respiratory  effort. No accessory muscle use.  Cardiovascular: Regular rate and rhythm, no murmurs / rubs / gallops. No extremity edema. 2+ pedal pulses.  Abdomen: no tenderness, no masses palpated. No hepatosplenomegaly. Bowel sounds positive.  Musculoskeletal: no clubbing / cyanosis. No joint deformity upper and lower extremities. Good ROM, no contractures. Normal muscle tone.  Skin: no rashes, lesions, ulcers. No induration Neurologic: CNgrossly intact. Sensation intact, MAEX$.  Psychiatric: anxious    Labs on Admission: I have personally reviewed following labs and imaging studies  CBC: Recent Labs  Lab 02/19/24 1635  WBC 13.9*  NEUTROABS 9.9*  HGB 12.2*  HCT 35.0*  MCV 83.7  PLT 242   Basic Metabolic Panel: Recent Labs  Lab 02/19/24 1635  NA 134*  K 3.9  CL 101  CO2 20*  GLUCOSE 103*  BUN 15  CREATININE 1.00  CALCIUM  8.9   GFR: CrCl cannot be calculated (Unknown ideal weight.). Liver Function Tests: Recent Labs  Lab 02/19/24 1635  AST 19  ALT <5  ALKPHOS 58  BILITOT 1.4*  PROT 6.6  ALBUMIN 4.0   No results for input(s): LIPASE, AMYLASE in the last 168 hours. No results for input(s): AMMONIA in the last 168 hours. Coagulation Profile: No results for input(s): INR, PROTIME in the last 168 hours. Cardiac Enzymes: Recent Labs  Lab 02/19/24 1937  CKTOTAL 77   BNP (last 3 results) No results for input(s): PROBNP in the last 8760 hours. HbA1C: No results for input(s): HGBA1C in the last 72 hours. CBG: No results for input(s): GLUCAP in the last 168 hours. Lipid Profile: No results for input(s): CHOL, HDL, LDLCALC, TRIG, CHOLHDL, LDLDIRECT in the last 72 hours. Thyroid  Function Tests: No results for input(s): TSH, T4TOTAL, FREET4, T3FREE, THYROIDAB in the last 72 hours. Anemia Panel: No results for input(s): VITAMINB12, FOLATE, FERRITIN, TIBC, IRON, RETICCTPCT in the last 72 hours. Urine analysis:    Component  Value Date/Time   COLORURINE YELLOW 02/19/2024 1839   APPEARANCEUR CLEAR 02/19/2024 1839   LABSPEC 1.020 02/19/2024 1839   PHURINE 6.0 02/19/2024 1839   GLUCOSEU NEGATIVE 02/19/2024 1839   HGBUR NEGATIVE 02/19/2024 1839   BILIRUBINUR NEGATIVE 02/19/2024 1839   KETONESUR 5 (A) 02/19/2024 1839   PROTEINUR NEGATIVE 02/19/2024 1839   NITRITE NEGATIVE 02/19/2024 1839   LEUKOCYTESUR NEGATIVE 02/19/2024 1839  Radiological Exams on Admission: CT Head Wo Contrast Result Date: 02/19/2024 CLINICAL DATA:  Provided history: Delirium. EXAM: CT HEAD WITHOUT CONTRAST TECHNIQUE: Contiguous axial images were obtained from the base of the skull through the vertex without intravenous contrast. RADIATION DOSE REDUCTION: This exam was performed according to the departmental dose-optimization program which includes automated exposure control, adjustment of the mA and/or kV according to patient size and/or use of iterative reconstruction technique. COMPARISON:  CT angiogram head/neck 01/07/2024. FINDINGS: Motion degraded examination. Within this limitation, findings are as follows. Brain: Generalized cerebral atrophy. There is no acute intracranial hemorrhage. No demarcated cortical infarct. No extra-axial fluid collection. No evidence of an intracranial mass. No midline shift. Vascular: No hyperdense vessel. Atherosclerotic calcifications. Skull: No calvarial fracture or aggressive osseous lesion. Sinuses/Orbits: No mass or acute finding within the imaged orbits. No significant paranasal sinus disease. Other: Trace fluid within left mastoid air cells. IMPRESSION: 1. Motion degraded examination. Within this limitation, no acute intracranial abnormality is identified. 2. Generalized cerebral atrophy. 3. Trace left mastoid effusion. Electronically Signed   By: Rockey Childs D.O.   On: 02/19/2024 18:23   CT Angio Chest/Abd/Pel for Dissection W and/or Wo Contrast Result Date: 02/19/2024 CLINICAL DATA:  Acute aortic syndrome  suspected. Patient has hyperventilating with erratic behavior. EXAM: CT ANGIOGRAPHY CHEST, ABDOMEN AND PELVIS TECHNIQUE: Non-contrast CT of the chest was initially obtained. Multidetector CT imaging through the chest, abdomen and pelvis was performed using the standard protocol during bolus administration of intravenous contrast. Multiplanar reconstructed images and MIPs were obtained and reviewed to evaluate the vascular anatomy. RADIATION DOSE REDUCTION: This exam was performed according to the departmental dose-optimization program which includes automated exposure control, adjustment of the mA and/or kV according to patient size and/or use of iterative reconstruction technique. CONTRAST:  75mL OMNIPAQUE  IOHEXOL  350 MG/ML SOLN COMPARISON:  Chest radiograph 10/10/2023. CT abdomen and pelvis 01/07/2024. CTA abdomen and pelvis 07/19/2021 FINDINGS: CTA CHEST FINDINGS Cardiovascular: Unenhanced images of the chest demonstrate calcification in the aorta and coronary arteries. No evidence of acute intramural hematoma. Images obtained during the arterial phase after contrast material administration demonstrate normal caliber thoracic aorta. Motion artifact is present but there is no evidence of aortic dissection. Great vessel origins are patent. Normal heart size. No pericardial effusions. Central pulmonary arteries appear patent without evidence of central pulmonary embolus. Mediastinum/Nodes: No enlarged mediastinal, hilar, or axillary lymph nodes. Thyroid  gland, trachea, and esophagus demonstrate no significant findings. Loop recorder in the left anterior chest wall. Lungs/Pleura: Calcified granuloma in the lingula. Lungs are otherwise clear. No pleural effusion or pneumothorax. Musculoskeletal: Degenerative changes in the spine. No acute bony abnormalities are seen. Motion artifact limits evaluation. Review of the MIP images confirms the above findings. CTA ABDOMEN AND PELVIS FINDINGS VASCULAR Aorta: Normal caliber  aorta without aneurysm, dissection, vasculitis or significant stenosis. Calcification of the aorta. Celiac: Motion artifact limits evaluation but the skin back axis appears patent despite calcification at the origin. SMA: Motion artifact limits evaluation but the superior mesenteric artery appears patent despite calcification at the origin. Renals: Duplicated right and single left renal arteries appear patent. Calcification at the origin. IMA: Patent without evidence of aneurysm, dissection, vasculitis or significant stenosis. Inflow: Patent without evidence of aneurysm, dissection, vasculitis or significant stenosis. Veins: No obvious venous abnormality within the limitations of this arterial phase study. Review of the MIP images confirms the above findings. NON-VASCULAR Hepatobiliary: No focal liver abnormality is seen. No gallstones, gallbladder wall thickening, or biliary dilatation. Pancreas: Unremarkable. No  pancreatic ductal dilatation or surrounding inflammatory changes. Spleen: Normal in size without focal abnormality. Adrenals/Urinary Tract: Adrenal glands are unremarkable. Kidneys are normal, without renal calculi, focal lesion, or hydronephrosis. Bladder is unremarkable. Stomach/Bowel: Stomach, small bowel, and colon are not abnormally distended. No wall thickening or inflammatory changes are seen. Appendix is not seen. Lymphatic: No significant lymphadenopathy. Reproductive: Prostate is unremarkable. Other: No free air or free fluid in the abdomen. Abdominal wall musculature appears intact. Musculoskeletal: Degenerative changes in the spine. No acute bony abnormalities. Review of the MIP images confirms the above findings. IMPRESSION: 1. Motion artifact limits examination. 2. No evidence of aneurysm, dissection, or significant stenosis in the thoracic or abdominal aorta. Aortic atherosclerosis. 3. No significant central pulmonary embolus is identified. 4. No evidence of active pulmonary disease. 5. No  acute process demonstrated in the abdomen or pelvis. Electronically Signed   By: Elsie Gravely M.D.   On: 02/19/2024 18:13    EKG: Independently reviewed.   Assessment/Plan  Acute delirium/Encephalopathy nos  - possible medication related , recent change in medications as well as recent steroid injection , possible occult infection with noted increase wbc - supportive care  - hold sedative medications  - monitor fever curve and wbc count -check inflammatory markers , ammonia , vbg -neuro exam non-focal  -place on neuro checks  -start seroquel  q8 hour 25mg  per neurology rec -precedex  for severe restlessness and agitation - follow up with neurology recs  Parkinson disease -resume Sinemet    Carotid A stenosis -no active issue   Chronic tension HA  -no active issues   HTN , uncontrolled -resume home regimen as able  -prn    GERD -ppi   HLD - resume   DVT prophylaxis:  Heparin  Code Status: full/ as discussed per patient wishes in event of cardiac arrest  Family Communication: none at bedside Disposition Plan: full/ as discussed per patient wishes in event of cardiac arrest  Consults called: neurology  Admission status: med tele   Camila DELENA Ned MD [facility]   If 7PM-7AM, please contact night-coverage [email or web address] Password [email or web address]  02/19/2024, 10:04 PM

## 2024-02-19 NOTE — ED Notes (Signed)
 Patient increasingly agitated. MD notified and aware

## 2024-02-19 NOTE — ED Notes (Signed)
 Non slip socks placed by RN

## 2024-02-20 ENCOUNTER — Other Ambulatory Visit: Payer: Self-pay | Admitting: Pulmonary Disease

## 2024-02-20 ENCOUNTER — Inpatient Hospital Stay (HOSPITAL_COMMUNITY)

## 2024-02-20 DIAGNOSIS — J9601 Acute respiratory failure with hypoxia: Secondary | ICD-10-CM | POA: Diagnosis not present

## 2024-02-20 DIAGNOSIS — E785 Hyperlipidemia, unspecified: Secondary | ICD-10-CM

## 2024-02-20 DIAGNOSIS — Z8659 Personal history of other mental and behavioral disorders: Secondary | ICD-10-CM

## 2024-02-20 DIAGNOSIS — I739 Peripheral vascular disease, unspecified: Secondary | ICD-10-CM

## 2024-02-20 DIAGNOSIS — R4182 Altered mental status, unspecified: Secondary | ICD-10-CM | POA: Diagnosis present

## 2024-02-20 DIAGNOSIS — I7090 Unspecified atherosclerosis: Secondary | ICD-10-CM | POA: Diagnosis not present

## 2024-02-20 DIAGNOSIS — R410 Disorientation, unspecified: Secondary | ICD-10-CM | POA: Diagnosis not present

## 2024-02-20 DIAGNOSIS — I10 Essential (primary) hypertension: Secondary | ICD-10-CM

## 2024-02-20 DIAGNOSIS — R451 Restlessness and agitation: Secondary | ICD-10-CM | POA: Diagnosis not present

## 2024-02-20 DIAGNOSIS — G20B1 Parkinson's disease with dyskinesia, without mention of fluctuations: Secondary | ICD-10-CM

## 2024-02-20 DIAGNOSIS — G20A1 Parkinson's disease without dyskinesia, without mention of fluctuations: Secondary | ICD-10-CM | POA: Diagnosis not present

## 2024-02-20 LAB — I-STAT VENOUS BLOOD GAS, ED
Acid-base deficit: 2 mmol/L (ref 0.0–2.0)
Bicarbonate: 23.7 mmol/L (ref 20.0–28.0)
Calcium, Ion: 1.1 mmol/L — ABNORMAL LOW (ref 1.15–1.40)
HCT: 35 % — ABNORMAL LOW (ref 39.0–52.0)
Hemoglobin: 11.9 g/dL — ABNORMAL LOW (ref 13.0–17.0)
O2 Saturation: 87 %
Potassium: 4.3 mmol/L (ref 3.5–5.1)
Sodium: 139 mmol/L (ref 135–145)
TCO2: 25 mmol/L (ref 22–32)
pCO2, Ven: 41.6 mmHg — ABNORMAL LOW (ref 44–60)
pH, Ven: 7.364 (ref 7.25–7.43)
pO2, Ven: 55 mmHg — ABNORMAL HIGH (ref 32–45)

## 2024-02-20 LAB — COMPREHENSIVE METABOLIC PANEL WITH GFR
ALT: <5 U/L (ref 0–44)
AST: 17 U/L (ref 15–41)
Albumin: 3.8 g/dL (ref 3.5–5.0)
Alkaline Phosphatase: 59 U/L (ref 38–126)
Anion gap: 12 (ref 5–15)
BUN: 11 mg/dL (ref 8–23)
CO2: 20 mmol/L — ABNORMAL LOW (ref 22–32)
Calcium: 8.8 mg/dL — ABNORMAL LOW (ref 8.9–10.3)
Chloride: 105 mmol/L (ref 98–111)
Creatinine, Ser: 0.9 mg/dL (ref 0.61–1.24)
GFR, Estimated: >60 mL/min (ref >60)
Glucose, Bld: 104 mg/dL — ABNORMAL HIGH (ref 70–99)
Potassium: 3.6 mmol/L (ref 3.5–5.1)
Sodium: 137 mmol/L (ref 135–145)
Total Bilirubin: 1.9 mg/dL — ABNORMAL HIGH (ref 0.0–1.2)
Total Protein: 6.3 g/dL — ABNORMAL LOW (ref 6.5–8.1)

## 2024-02-20 LAB — CBC
HCT: 37.3 % — ABNORMAL LOW (ref 39.0–52.0)
Hemoglobin: 12.5 g/dL — ABNORMAL LOW (ref 13.0–17.0)
MCH: 29.3 pg (ref 26.0–34.0)
MCHC: 33.5 g/dL (ref 30.0–36.0)
MCV: 87.6 fL (ref 80.0–100.0)
Platelets: 206 K/uL (ref 150–400)
RBC: 4.26 MIL/uL (ref 4.22–5.81)
RDW: 14.7 % (ref 11.5–15.5)
WBC: 12.3 K/uL — ABNORMAL HIGH (ref 4.0–10.5)
nRBC: 0 % (ref 0.0–0.2)

## 2024-02-20 LAB — PROCALCITONIN: Procalcitonin: <0.1 ng/mL

## 2024-02-20 LAB — I-STAT ARTERIAL BLOOD GAS, ED
Acid-base deficit: 4 mmol/L — ABNORMAL HIGH (ref 0.0–2.0)
Bicarbonate: 21.6 mmol/L (ref 20.0–28.0)
Calcium, Ion: 1.11 mmol/L — ABNORMAL LOW (ref 1.15–1.40)
HCT: 34 % — ABNORMAL LOW (ref 39.0–52.0)
Hemoglobin: 11.6 g/dL — ABNORMAL LOW (ref 13.0–17.0)
O2 Saturation: 100 %
Patient temperature: 97.4
Potassium: 3.7 mmol/L (ref 3.5–5.1)
Sodium: 138 mmol/L (ref 135–145)
TCO2: 23 mmol/L (ref 22–32)
pCO2 arterial: 38 mmHg (ref 32–48)
pH, Arterial: 7.359 (ref 7.35–7.45)
pO2, Arterial: 350 mmHg — ABNORMAL HIGH (ref 83–108)

## 2024-02-20 LAB — SEDIMENTATION RATE: Sed Rate: 5 mm/h (ref 0–16)

## 2024-02-20 LAB — PHOSPHORUS: Phosphorus: 3.4 mg/dL (ref 2.5–4.6)

## 2024-02-20 LAB — MAGNESIUM: Magnesium: 1.9 mg/dL (ref 1.7–2.4)

## 2024-02-20 LAB — GLUCOSE, CAPILLARY
Glucose-Capillary: 140 mg/dL — ABNORMAL HIGH (ref 70–99)
Glucose-Capillary: 94 mg/dL (ref 70–99)
Glucose-Capillary: 112 mg/dL — ABNORMAL HIGH (ref 70–99)
Glucose-Capillary: 113 mg/dL — ABNORMAL HIGH (ref 70–99)

## 2024-02-20 LAB — MRSA NEXT GEN BY PCR, NASAL: MRSA by PCR Next Gen: DETECTED — AB

## 2024-02-20 LAB — TSH: TSH: 2.374 u[IU]/mL (ref 0.350–4.500)

## 2024-02-20 MED ORDER — SODIUM CHLORIDE 0.9 % IV SOLN
250.0000 mL | INTRAVENOUS | Status: AC
  Administered 2024-02-20: 250 mL via INTRAVENOUS

## 2024-02-20 MED ORDER — ROCURONIUM BROMIDE 10 MG/ML (PF) SYRINGE
PREFILLED_SYRINGE | INTRAVENOUS | Status: AC | PRN
  Administered 2024-02-20: 100 mg via INTRAVENOUS

## 2024-02-20 MED ORDER — DOCUSATE SODIUM 50 MG/5ML PO LIQD: 100.0000 mg | Freq: Two times a day (BID) | ORAL | Status: DC | PRN

## 2024-02-20 MED ORDER — OSMOLITE 1.5 CAL PO LIQD
1000.0000 mL | ORAL | Status: DC
  Administered 2024-02-20 – 2024-02-21 (×2): 1000 mL
  Filled 2024-02-20 (×2): qty 1000

## 2024-02-20 MED ORDER — POLYETHYLENE GLYCOL 3350 17 G PO PACK: 17.0000 g | PACK | Freq: Every day | ORAL | Status: DC | PRN

## 2024-02-20 MED ORDER — ETOMIDATE 2 MG/ML IV SOLN
INTRAVENOUS | Status: AC
  Filled 2024-02-20: qty 20

## 2024-02-20 MED ORDER — ZIPRASIDONE MESYLATE 20 MG IM SOLR: 10.0000 mg | Freq: Once | INTRAMUSCULAR | Status: DC

## 2024-02-20 MED ORDER — NOREPINEPHRINE 4 MG/250ML-% IV SOLN
0.0000 ug/min | INTRAVENOUS | Status: DC
  Administered 2024-02-20: 2 ug/min via INTRAVENOUS
  Filled 2024-02-20: qty 250

## 2024-02-20 MED ORDER — ROCURONIUM BROMIDE 10 MG/ML (PF) SYRINGE
PREFILLED_SYRINGE | INTRAVENOUS | Status: AC
  Filled 2024-02-20: qty 10

## 2024-02-20 MED ORDER — ACETAMINOPHEN 325 MG PO TABS
650.0000 mg | ORAL_TABLET | Freq: Four times a day (QID) | ORAL | Status: DC | PRN
  Administered 2024-02-21 – 2024-02-22 (×2): 650 mg
  Filled 2024-02-20 (×2): qty 2

## 2024-02-20 MED ORDER — PROSOURCE TF20 ENFIT COMPATIBL EN LIQD
60.0000 mL | Freq: Every day | ENTERAL | Status: DC
  Administered 2024-02-20: 60 mL
  Filled 2024-02-20: qty 60

## 2024-02-20 MED ORDER — HALOPERIDOL LACTATE 5 MG/ML IJ SOLN
5.0000 mg | Freq: Four times a day (QID) | INTRAMUSCULAR | Status: DC | PRN
  Administered 2024-02-20 – 2024-02-22 (×2): 5 mg via INTRAMUSCULAR
  Filled 2024-02-20 (×2): qty 1

## 2024-02-20 MED ORDER — ACETAMINOPHEN 650 MG RE SUPP: 650.0000 mg | Freq: Four times a day (QID) | RECTAL | Status: DC | PRN

## 2024-02-20 MED ORDER — ORAL CARE MOUTH RINSE: 15.0000 mL | OROMUCOSAL | Status: DC | PRN

## 2024-02-20 MED ORDER — FENTANYL BOLUS VIA INFUSION
25.0000 ug | INTRAVENOUS | Status: DC | PRN
  Administered 2024-02-21: 25 ug via INTRAVENOUS

## 2024-02-20 MED ORDER — ETOMIDATE 2 MG/ML IV SOLN
INTRAVENOUS | Status: AC | PRN
  Administered 2024-02-20: 20 mg via INTRAVENOUS

## 2024-02-20 MED ORDER — CARBIDOPA-LEVODOPA 25-100 MG PO TABS
2.0000 | ORAL_TABLET | Freq: Three times a day (TID) | ORAL | Status: DC
  Administered 2024-02-20 – 2024-02-21 (×5): 2
  Filled 2024-02-20 (×5): qty 2

## 2024-02-20 MED ORDER — MUPIROCIN 2 % EX OINT
TOPICAL_OINTMENT | Freq: Two times a day (BID) | CUTANEOUS | Status: AC
  Filled 2024-02-20 (×3): qty 22

## 2024-02-20 MED ORDER — FENTANYL 2500MCG IN NS 250ML (10MCG/ML) PREMIX INFUSION
0.0000 ug/h | INTRAVENOUS | Status: DC
  Administered 2024-02-20: 25 ug/h via INTRAVENOUS
  Filled 2024-02-20: qty 250

## 2024-02-20 MED ORDER — CARBIDOPA-LEVODOPA 25-100 MG PO TABS
3.0000 | ORAL_TABLET | Freq: Three times a day (TID) | ORAL | Status: DC
  Administered 2024-02-20 – 2024-02-21 (×5): 3
  Filled 2024-02-20 (×6): qty 3

## 2024-02-20 MED ORDER — THIAMINE MONONITRATE 100 MG PO TABS
100.0000 mg | ORAL_TABLET | Freq: Every day | ORAL | Status: DC
  Administered 2024-02-20 – 2024-02-21 (×2): 100 mg
  Filled 2024-02-20 (×2): qty 1

## 2024-02-20 MED ORDER — QUETIAPINE FUMARATE 25 MG PO TABS
25.0000 mg | ORAL_TABLET | Freq: Every day | ORAL | Status: DC
  Administered 2024-02-20: 25 mg
  Filled 2024-02-20: qty 1

## 2024-02-20 MED ORDER — LACTATED RINGERS IV SOLN: INTRAVENOUS | Status: DC

## 2024-02-20 MED ORDER — ZIPRASIDONE MESYLATE 20 MG IM SOLR
10.0000 mg | Freq: Once | INTRAMUSCULAR | Status: AC
  Administered 2024-02-20: 10 mg via INTRAMUSCULAR
  Filled 2024-02-20: qty 20

## 2024-02-20 MED ORDER — DOCUSATE SODIUM 100 MG PO CAPS: 100.0000 mg | ORAL_CAPSULE | Freq: Two times a day (BID) | ORAL | Status: DC | PRN

## 2024-02-20 MED ORDER — CHLORHEXIDINE GLUCONATE CLOTH 2 % EX PADS
6.0000 | MEDICATED_PAD | Freq: Every day | CUTANEOUS | Status: DC
  Administered 2024-02-20 – 2024-02-26 (×7): 6 via TOPICAL

## 2024-02-20 MED ORDER — ETOMIDATE 2 MG/ML IV SOLN
INTRAVENOUS | Status: AC
Start: 2024-02-20 — End: 2024-02-20
  Filled 2024-02-20: qty 20

## 2024-02-20 MED ORDER — FAMOTIDINE 20 MG PO TABS: 20.0000 mg | ORAL_TABLET | Freq: Two times a day (BID) | ORAL | Status: DC

## 2024-02-20 MED ORDER — EPINEPHRINE 1 MG/10ML IJ SOSY
PREFILLED_SYRINGE | INTRAMUSCULAR | Status: AC | PRN
  Administered 2024-02-20: 1 mg via INTRAVENOUS

## 2024-02-20 MED ORDER — ORAL CARE MOUTH RINSE
15.0000 mL | OROMUCOSAL | Status: DC
  Administered 2024-02-21 (×7): 15 mL via OROMUCOSAL

## 2024-02-20 MED ORDER — PANTOPRAZOLE SODIUM 40 MG IV SOLR
40.0000 mg | Freq: Every day | INTRAVENOUS | Status: DC
  Administered 2024-02-20 – 2024-02-21 (×2): 40 mg via INTRAVENOUS
  Filled 2024-02-20 (×2): qty 10

## 2024-02-20 MED ORDER — POLYETHYLENE GLYCOL 3350 17 G PO PACK: 17.0000 g | PACK | Freq: Every day | ORAL | Status: DC

## 2024-02-20 MED ORDER — CARBIDOPA-LEVODOPA 25-100 MG PO TABS
2.0000 | ORAL_TABLET | Freq: Every day | ORAL | Status: DC
  Administered 2024-02-20 – 2024-02-21 (×2): 2
  Filled 2024-02-20 (×2): qty 2

## 2024-02-20 MED ORDER — DIPHENHYDRAMINE HCL 50 MG/ML IJ SOLN
12.5000 mg | Freq: Once | INTRAMUSCULAR | Status: AC
  Administered 2024-02-20: 12.5 mg via INTRAVENOUS
  Filled 2024-02-20: qty 1

## 2024-02-20 MED ORDER — PROPOFOL 1000 MG/100ML IV EMUL
0.0000 ug/kg/min | INTRAVENOUS | Status: DC
  Administered 2024-02-20 (×2): 25 ug/kg/min via INTRAVENOUS
  Administered 2024-02-20: 10 ug/kg/min via INTRAVENOUS
  Administered 2024-02-21: 25 ug/kg/min via INTRAVENOUS
  Filled 2024-02-20 (×4): qty 100

## 2024-02-20 MED ORDER — DEXMEDETOMIDINE HCL IN NACL 400 MCG/100ML IV SOLN
0.0000 ug/kg/h | INTRAVENOUS | Status: DC
  Administered 2024-02-20 – 2024-02-23 (×2): 0.4 ug/kg/h via INTRAVENOUS
  Administered 2024-02-23: 0.8 ug/kg/h via INTRAVENOUS
  Filled 2024-02-20 (×3): qty 100

## 2024-02-20 MED ORDER — ROTIGOTINE 4 MG/24HR TD PT24
1.0000 | MEDICATED_PATCH | Freq: Every day | TRANSDERMAL | Status: DC
  Administered 2024-02-20 – 2024-02-25 (×6): 1 via TRANSDERMAL
  Filled 2024-02-20 (×6): qty 1

## 2024-02-20 NOTE — Progress Notes (Signed)
 Late entry- 1mg  ativan  wasted at bedside witnessed by Chiquita Pay RN. Tried to waste in pyxis but pt already transferred to floor.

## 2024-02-20 NOTE — TOC Initial Note (Signed)
 Transition of Care [facility]) - Initial/Assessment Note    Patient Details  Name: Terry [name] MRN: [id number] Date of Birth: 1946-02-19  [facility]) CM/SW Contact:    Lauraine FORBES Saa, LCSW Phone Number: 02/20/2024, 11:25 AM  Clinical Narrative:                  11:25 AM Per chart review, patient is from [facility]. [facility] admissions confirmed patient was ILF at facility and able to return when medically ready. Patient was brought in by [facility] for erratic behavior. Patient is currently intubated. Per chart review, patient has a PCP and insurance. Patient does not have SNF or HH history. Patient has DME (can walker, shower grab bars, shower chair) history. Patient's preferred pharmacy's are [facility] [phone] [facility] and [facility] [address]. TOC will continue to follow and be available to assist.  Expected Discharge Plan: Home/Self Care ([facility] ILF) Barriers to Discharge: Continued Medical Work up   Patient Goals and CMS Choice            Expected Discharge Plan and Services In-house Referral: Clinical Social Work     Living arrangements for the past 2 months: Independent Living Facility                                      Prior Living Arrangements/Services Living arrangements for the past 2 months: Independent Living Facility Lives with:: Self Patient language and need for interpreter reviewed:: Yes        Need for Family Participation in Patient Care: Yes (Comment)   Current home services: DME Criminal Activity/Legal Involvement Pertinent to Current Situation/Hospitalization: No - Comment as needed  Activities of Daily Living      Permission Sought/Granted Permission sought to share information with : Family Supports, Oceanographer granted to share information with : No (Contact information on chart)  Share Information with NAME: Terry [name]  Permission granted to share info w AGENCY: [facility]  ILF  Permission granted to share info w Relationship: Spouse  Permission granted to share info w Contact Information: [phone]  Emotional Assessment   Attitude/Demeanor/Rapport: Unable to Assess, Intubated (Following Commands or Not Following Commands) Affect (typically observed): Unable to Assess   Alcohol / Substance Use: Not Applicable Psych Involvement: No (comment)  Admission diagnosis:  Delirium [R41.0] Involuntary trembling [R25.1] AMS (altered mental status) [R41.82] Patient Active Problem List   Diagnosis Date Noted   AMS (altered mental status) 02/20/2024   Delirium 02/19/2024   Lower abdominal pain 11/11/2021   Irritable bowel syndrome with both constipation and diarrhea 11/11/2021   Change in bowel habits 11/11/2021   History of iron deficiency 11/11/2021   Constipation 09/09/2021   Gastroesophageal reflux disease 09/09/2021   Anemia 09/09/2021   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   Carotid artery occlusion 06/14/2021   Stroke (HCC) 04/24/2021   Headache 04/24/2021   Hypertensive crisis 04/23/2021   Chest tightness 10/09/2019   Shortness of breath 10/09/2019   Erectile dysfunction 03/12/2019   Essential hypertension 04/29/2018   Need for immunization against influenza 04/29/2018   Parkinson's disease (HCC) 04/29/2018   Carotid artery stenosis 04/29/2018   HLD (hyperlipidemia) 04/29/2018   GERD (gastroesophageal reflux disease) 04/29/2018   Colon cancer screening 04/29/2018   Chronic tension headaches 04/29/2018   Murmur, cardiac 02/12/2018   Primary  osteoarthritis of right knee 02/06/2018   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   Pure hypercholesterolemia 04/28/2016   PCP:  Burney Darice CROME, MD Pharmacy:   [id number] [place], [place] - [address] [place] [address] Phone: [phone] Fax: [phone]  [id number] [place], [place] -  [address] AT [facility] [address] [place] [address] Phone: [phone] Fax: [phone]     Social Drivers of Health (SDOH) Social History: SDOH Screenings   Food Insecurity: Low Risk  (01/07/2024)   Received from [facility]  Housing: Low Risk  (01/07/2024)   Received from [facility]  Transportation Needs: No Transportation Needs (01/07/2024)   Received from [facility]  Utilities: Low Risk  (01/07/2024)   Received from [facility]  Depression (PHQ2-9): Medium Risk (12/10/2019)  Social Connections: Unknown (10/11/2019)   Received from [facility]  Tobacco Use: Medium Risk (12/10/2023)   SDOH Interventions:     Readmission Risk Interventions     No data to display

## 2024-02-20 NOTE — ED Notes (Signed)
 Pt continues to try and get out of bed and not able to follow commands and not redirectable. Pt able to state his name and that he needs to urinate. Pt refusing to use urinal and swatting it away from staff. MD made aware of pt mental status at this time.

## 2024-02-20 NOTE — ED Notes (Signed)
 Inpatient MD came to see pt regarding pt respiratory status. Advised to continue oral care and monitor pt oxygenation and respirations.

## 2024-02-20 NOTE — Progress Notes (Addendum)
 NEUROLOGY CONSULT FOLLOW UP NOTE   Date of service: February 20, 2024 Patient Name: Terry [name] MRN:  [id number] DOB:  [birth year]  Interval Hx/subjective   Seen at the bedside in the emergency department.  By the time neurology team reevaluated patient today, he was intubated with ventilatory support.  Per bedside RN, patient had been agitated and on Precedex  infusion.  Had exhibited episodes of increased cough and inability to clear his secretions.  Initially was managed with elevation of head of the bed; however, patient continued to have difficulty breathing became bradycardic, hypoxic, ultimately needing intubation.  He was resting comfortably upon visual inspection.  With lactated Ringer's propofol  and fentanyl  running at the bedside. Vitals   Vitals:   02/20/24 0915 02/20/24 0930 02/20/24 0945 02/20/24 0946  BP: (!) 175/91 (!) 174/77 (!) 155/91 (!) 155/91  Pulse: 95 100 100 (!) 101  Resp: 19 18 18 16   Temp: 97.6 F (36.4 C) 97.6 F (36.4 C) 97.7 F (36.5 C) 97.7 F (36.5 C)  TempSrc:    Axillary  SpO2: 100% 100% 100% 100%  Weight:      Height:         Body mass index is 23.06 kg/m.  Physical Exam   Constitutional: Chronically ill-appearing elderly male, on stretcher, mechanically ventilated Psych: Mildly restless Eyes: No scleral injection. Head: Normocephalic.  Cardiovascular: Intermittently tachycardic Respiratory: Intubated mechanically ventilated GI: Soft.  No distension.  Skin: WDI.  Neurologic Examination   Sedated. Pupils reactive to light. Gag and cough reflex present. Withdrawing from pain.   Medications  Current Facility-Administered Medications:    0.9 %  sodium chloride  infusion, , Intravenous, Continuous, Debby Camila LABOR, MD, Held at 02/20/24 0218   0.9 %  sodium chloride  infusion, 250 mL, Intravenous, Continuous, Olalere, Adewale A, MD   acetaminophen  (TYLENOL ) tablet 650 mg, 650 mg, Per Tube, Q6H PRN **OR** acetaminophen  (TYLENOL ) suppository  650 mg, 650 mg, Rectal, Q6H PRN, Merilee Linsey I, RPH   albuterol  (PROVENTIL ) (2.5 MG/3ML) 0.083% nebulizer solution 2.5 mg, 2.5 mg, Nebulization, Q2H PRN, Debby Camila LABOR, MD   carbidopa -levodopa  (SINEMET  CR) 50-200 MG per tablet controlled release 1 tablet, 1 tablet, Oral, QHS, Kirkpatrick, Aisha SQUIBB, MD   carbidopa -levodopa  (SINEMET  IR) 25-100 MG per tablet immediate release 2 tablet, 2 tablet, Per Tube, TID, Merilee Linsey I, RPH   carbidopa -levodopa  (SINEMET  IR) 25-100 MG per tablet immediate release 3 tablet, 3 tablet, Per Tube, TID, Merilee Linsey I, RPH   Chlorhexidine  Gluconate Cloth 2 % PADS 6 each, 6 each, Topical, Daily, Olalere, Adewale A, MD, 6 each at 02/20/24 9046   dexmedetomidine  (PRECEDEX ) 400 MCG/100ML (4 mcg/mL) infusion, 0-1.2 mcg/kg/hr, Intravenous, Titrated, Debby Camila LABOR, MD, Paused at 02/20/24 9266   docusate (COLACE) 50 MG/5ML liquid 100 mg, 100 mg, Per Tube, BID PRN, Merilee Linsey I, RPH   etomidate  (AMIDATE ) 2 MG/ML injection, , , ,    etomidate  (AMIDATE ) 2 MG/ML injection, , , ,    fentaNYL  (SUBLIMAZE ) bolus via infusion 25-100 mcg, 25-100 mcg, Intravenous, Q15 min PRN, Merilee Linsey I, RPH   fentaNYL  in NS (25mcg/ml) infusion-PREMIX, 0-400 mcg/hr, Intravenous, Continuous, Pamella Sharper A, DO, Last Rate: 2.5 mL/hr at 02/20/24 0801, 25 mcg/hr at 02/20/24 0801   haloperidol  lactate (HALDOL ) injection 5 mg, 5 mg, Intramuscular, Q6H PRN, Debby Camila A, MD, 5 mg at 02/20/24 0120   heparin  injection 5,000 Units, 5,000 Units, Subcutaneous, Q8H, Debby Camila LABOR, MD, 5,000 Units at 02/20/24 0606   lactated ringers   infusion, , Intravenous, Continuous, Olalere, Adewale A, MD, Last Rate: 75 mL/hr at 02/20/24 1018, New Bag at 02/20/24 1018   norepinephrine  (LEVOPHED ) 4mg  in (0.016 mg/mL) premix infusion, 0-10 mcg/min, Intravenous, Titrated, Olalere, Adewale A, MD   ondansetron  (ZOFRAN ) tablet 4 mg, 4 mg, Oral, Q6H PRN  **OR** ondansetron  (ZOFRAN ) injection 4 mg, 4 mg, Intravenous, Q6H PRN, Debby Camila LABOR, MD   pantoprazole  (PROTONIX ) injection 40 mg, 40 mg, Intravenous, QHS, Olalere, Adewale A, MD   polyethylene glycol (MIRALAX  / GLYCOLAX ) packet 17 g, 17 g, Per Tube, Daily PRN, Merilee Linsey I, RPH   polyethylene glycol (MIRALAX  / GLYCOLAX ) packet 17 g, 17 g, Per Tube, Daily, Merilee Linsey I, RPH   propofol  (DIPRIVAN ) 1000 MG/100ML infusion, 0-50 mcg/kg/min, Intravenous, Continuous, Pamella Ozell LABOR, DO, Last Rate: 11.57 mL/hr at 02/20/24 0946, 25 mcg/kg/min at 02/20/24 0946   QUEtiapine  (SEROQUEL ) tablet 25 mg, 25 mg, Per Tube, QHS, Merilee Linsey I, RPH   rocuronium  (ZEMURON ) 100 MG/10ML injection, , , ,   Labs and Diagnostic Imaging   CBC:  Recent Labs  Lab 02/19/24 1635 02/19/24 2321 02/20/24 0408 02/20/24 0846 02/20/24 0927  WBC 13.9* 12.0* 12.3*  --   --   NEUTROABS 9.9*  --   --   --   --   HGB 12.2* 11.9* 12.5* 11.6* 11.9*  HCT 35.0* 34.1* 37.3* 34.0* 35.0*  MCV 83.7 84.6 87.6  --   --   PLT 242 221 206  --   --     Basic Metabolic Panel:  Lab Results  Component Value Date   NA 139 02/20/2024   K 4.3 02/20/2024   CO2 20 (L) 02/20/2024   GLUCOSE 104 (H) 02/20/2024   BUN 11 02/20/2024   CREATININE 0.90 02/20/2024   CALCIUM  8.8 (L) 02/20/2024   GFRNONAA >60 02/20/2024   GFRAA 85 11/18/2019   Lipid Panel:  Lab Results  Component Value Date   LDLCALC 31 04/28/2021   HgbA1c:  Lab Results  Component Value Date   HGBA1C 5.4 04/24/2021   Urine Drug Screen:     Component Value Date/Time   LABOPIA NONE DETECTED 02/19/2024 1839   COCAINSCRNUR NONE DETECTED 02/19/2024 1839   LABBENZ NONE DETECTED 02/19/2024 1839   AMPHETMU NONE DETECTED 02/19/2024 1839   THCU NONE DETECTED 02/19/2024 1839   LABBARB NONE DETECTED 02/19/2024 1839    Alcohol Level No results found for: ETH INR No results found for: INR APTT No results found for: APTT AED levels: No  results found for: PHENYTOIN, ZONISAMIDE, LAMOTRIGINE, LEVETIRACETA  CT Head without contrast(Personally reviewed): No acute intracranial abnormality  CT angio chest,abd,pelvis No PE on chest imaging. No evidence of aneurysm, dissection, or significant stenosis in the thoracic or abdominal aorta. Aortic atherosclerosis.    Assessment   Terry [name] is a 78 y.o. 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  Since admission, patient is now mechanically ventilated in the setting of acute hypoxemic respiratory failure   Acute hypoxemic respiratory failure: Intubated and ventilated.  Delirium vs psychosis in patient with parkinson's disease Parkinson's disease with dyskinesia and and history of psychosis -Etiology: recent steroid injection vs changing doses of levodopa -carbidopa  in the past few months. Oper Dr. Dala note (outpatient neurologist), patient has a prior history of psychosis. Hallucinations at  baseline, noted to be mild in May. He was previously on Pimavanserin. Daytime carbidopa /levodopa  stopped on  11/26/2023. He was started on Crexont , an extended release carbidopa -levodopa ) 250 mg: 2 pills at 6AM, 2 pills at 11 AM, and 2 pills at 4 PM.  He was also continued on Sinemet  at night time. No recent episodes of psychosis or agitation since most recent change in medications.  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 High risk for aspiration. Patient received rimabotulimum toxin B Hx bradycardia with pauses: has declined PPM Neurogenic orthostatic hypotension: Per 11/2023 cards note, SBP goal <150 Large vessel atherosclerosis and PAD: continue Lipitor  Recommendations  - Patient has NG tube; will need his current dosing of Levodopa  carbidopa   - Crexont  87.5-350 mg (2 pills) TID per tube  - Sinement CR  50/200 at bed time per tube - will consider changesto meds when patient is stable and extubated. - Patient is not a candidate for dopamine agonist due to hx of psychosis - Speech therapy when able - High risk for ICU/Hospital delirium and aspiration. ______________________________________________________________________   Signed, Hadassah Ala, MD [facility] internal medicine resident [facility] service  NEUROHOSPITALIST ADDENDUM Performed a face to face diagnostic evaluation.   I have reviewed the contents of history and physical exam as documented by PA/ARNP/Resident and agree with above documentation.  I have discussed and formulated the above plan as documented. Edits to the note have been made as needed.  Impression/Key exam findings/Plan: continue carbidopa /levodopa  at current doses since he is intubated. Will consider changes once stable and extubated. At this time, we will signoff.  Kamryn Gauthier, MD [facility] [phone]   If 7pm to 7am, please call on call as listed on AMION.

## 2024-02-20 NOTE — Consult Note (Addendum)
 NAME:  Terry [name], MRN:  [id number], DOB:  12/20/1945, LOS: 1 ADMISSION DATE:  02/19/2024, CONSULTATION DATE:  02/20/24  REFERRING MD:  Dr Leotis, CHIEF COMPLAINT:  AMS, respiratory failure   History of Present Illness:  Patient with medical history significant for Parkinson's disease, CVA, carotid artery stenosis, chronic headaches came in with altered mental status, getting agitated, erratic behavior  History of recent steroid injection for back pain and progressively more agitated over the last 4 days  Remained restless and agitated, required Precedex . While in the ED received Valium , Haldol , Seroquel  without significant improvement in restlessness This morning noted to be more altered, bradycardic and hypoxemic  Emergently intubated  Admitted to ICU  Pertinent  Medical History   Past Medical History:  Diagnosis Date   Carotid artery stenosis 04/29/2018   Chronic tension headaches 04/29/2018   Essential hypertension 04/29/2018   GERD (gastroesophageal reflux disease) 04/29/2018   HLD (hyperlipidemia) 04/29/2018   Parkinson's disease (HCC) 04/29/2018   Significant Hospital Events: Including procedures, antibiotic start and stop dates in addition to other pertinent events   7/29-presented with agitation and restlessness 7/30-emergently intubated-hypoxemia, bradycardia  Interim History / Subjective:  Sedated on propofol , tolerating ventilator support well  Objective    Blood pressure (!) 167/82, pulse 95, temperature (!) 97.4 F (36.3 C), resp. rate 18, height 6' (1.829 m), weight 77.1 kg, SpO2 100%.    Vent Mode: PRVC FiO2 (%):  [40 %-100 %] 40 % Set Rate:  [18 bmp] 18 bmp Vt Set:  [500 mL-620 mL] 620 mL PEEP:  [5 cmH20] 5 cmH20  No intake or output data in the 24 hours ending 02/20/24 0859 Filed Weights   02/20/24 0417  Weight: 77.1 kg    Examination: General: Elderly, chronically ill-appearing HENT: Dry oral mucosa, endotracheal tube in place Lungs: Clear breath  sounds Cardiovascular: S1-S2 appreciated Abdomen: Soft, bowel sounds appreciated Extremities: No clubbing, no edema Neuro: Sedated, pupils about 2 mm reacting GU:   I reviewed last 24 h vitals and pain scores, last 48 h intake and output, last 24 h labs and trends, and last 24 h imaging results.  Resolved problem list   Assessment and Plan   Acute hypoxemic respiratory failure -Continue mechanical ventilation per ARDS protocol -Target TVol 6-8cc/kgIBW -Target Plateau Pressure < 30cm H20 -Target driving pressure less than 15 cm of water -Target PaO2 55-65: titrate PEEP/FiO2 per protocol -Ventilator associated pneumonia prevention protocol  Agitation and restlessness - Likely related to his recent steroid injection - May also be responsible for his leukocytosis  Parkinson's disease - Appreciate neurology follow-up - Started on Sinemet   Carotid stenosis - Continue monitoring  Hypertension - Continue to monitor - Will initiate home medications  Hyperlipidemia  Leukocytosis - Urinalysis unremarkable -CT chest with no acute infiltrate - Will monitor -no obvious source of infection  Chronic anemia  Best Practice (right click and Reselect all SmartList Selections daily)   Diet/type: tubefeeds DVT prophylaxis prophylactic heparin   Pressure ulcer(s): N/A GI prophylaxis: PPI Lines: N/A Foley:  Yes, and it is still needed Code Status:  full code Last date of multidisciplinary goals of care discussion [pending]  Labs   CBC: Recent Labs  Lab 02/19/24 1635 02/19/24 2321 02/20/24 0408 02/20/24 0846  WBC 13.9* 12.0* 12.3*  --   NEUTROABS 9.9*  --   --   --   HGB 12.2* 11.9* 12.5* 11.6*  HCT 35.0* 34.1* 37.3* 34.0*  MCV 83.7 84.6 87.6  --   PLT 242 221  206  --     Basic Metabolic Panel: Recent Labs  Lab 02/19/24 1635 02/19/24 2321 02/20/24 0408 02/20/24 0846  NA 134*  --  137 138  K 3.9  --  3.6 3.7  CL 101  --  105  --   CO2 20*  --  20*  --    GLUCOSE 103*  --  104*  --   BUN 15  --  11  --   CREATININE 1.00 0.93 0.90  --   CALCIUM  8.9  --  8.8*  --    GFR: Estimated Creatinine Clearance: 75 mL/min (by C-G formula based on SCr of 0.9 mg/dL). Recent Labs  Lab 02/19/24 1635 02/19/24 2321 02/20/24 0408  PROCALCITON  --  <0.10  --   WBC 13.9* 12.0* 12.3*    Liver Function Tests: Recent Labs  Lab 02/19/24 1635 02/20/24 0408  AST 19 17  ALT <5 <5  ALKPHOS 58 59  BILITOT 1.4* 1.9*  PROT 6.6 6.3*  ALBUMIN 4.0 3.8   No results for input(s): LIPASE, AMYLASE in the last 168 hours. Recent Labs  Lab 02/19/24 2320  AMMONIA <13    ABG    Component Value Date/Time   PHART 7.359 02/20/2024 0846   PCO2ART 38.0 02/20/2024 0846   PO2ART 350 (H) 02/20/2024 0846   HCO3 21.6 02/20/2024 0846   TCO2 23 02/20/2024 0846   ACIDBASEDEF 4.0 (H) 02/20/2024 0846   O2SAT 100 02/20/2024 0846     Coagulation Profile: No results for input(s): INR, PROTIME in the last 168 hours.  Cardiac Enzymes: Recent Labs  Lab 02/19/24 1937  CKTOTAL 77    HbA1C: Hemoglobin A1C  Date/Time Value Ref Range Status  12/10/2019 11:39 AM 5.8 (A) 4.0 - 5.6 % Final   Hgb A1c MFr Bld  Date/Time Value Ref Range Status  04/24/2021 05:00 AM 5.4 4.8 - 5.6 % Final    Comment:    (NOTE) Pre diabetes:          5.7%-6.4%  Diabetes:              >6.4%  Glycemic control for   <7.0% adults with diabetes     CBG: No results for input(s): GLUCAP in the last 168 hours.  Review of Systems:   Unable to participate  Past Medical History:  He,  has a past medical history of Carotid artery stenosis (04/29/2018), Chronic tension headaches (04/29/2018), Essential hypertension (04/29/2018), GERD (gastroesophageal reflux disease) (04/29/2018), HLD (hyperlipidemia) (04/29/2018), and Parkinson's disease (HCC) (04/29/2018).   Surgical History:   Past Surgical History:  Procedure Laterality Date   BIOPSY  01/09/2022   Procedure: BIOPSY;  Surgeon:  Wilhelmenia Aloha Raddle., MD;  Location: [facility];  Service: Gastroenterology;;  EGD and COLON   COLONOSCOPY WITH PROPOFOL  N/A 01/09/2022   Procedure: COLONOSCOPY WITH PROPOFOL ;  Surgeon: Wilhelmenia Aloha Raddle., MD;  Location: [facility];  Service: Gastroenterology;  Laterality: N/A;   ENDARTERECTOMY Left 04/27/2021   Procedure: LEFT CAROTID ENDARTERECTOMY;  Surgeon: Serene Gaile ORN, MD;  Location: [facility];  Service: Vascular;  Laterality: Left;   ESOPHAGOGASTRODUODENOSCOPY (EGD) WITH PROPOFOL  N/A 01/09/2022   Procedure: ESOPHAGOGASTRODUODENOSCOPY (EGD) WITH PROPOFOL ;  Surgeon: Wilhelmenia Aloha Raddle., MD;  Location: [facility];  Service: Gastroenterology;  Laterality: N/A;   LEFT HEART CATH AND CORONARY ANGIOGRAPHY N/A 11/25/2019   Procedure: LEFT HEART CATH AND CORONARY ANGIOGRAPHY;  Surgeon: Claudene Victory ORN, MD;  Location: [facility];  Service: Cardiovascular;  Laterality: N/A;   PATCH  ANGIOPLASTY Left 04/27/2021   Procedure: PATCH ANGIOPLASTY USING GEORGE BIOLOGIC PATCH;  Surgeon: Serene Gaile ORN, MD;  Location: [facility] OR;  Service: Vascular;  Laterality: Left;   TONSILLECTOMY       Social History:   reports that he has never smoked. He has never been exposed to tobacco smoke. He has quit using smokeless tobacco.  His smokeless tobacco use included snuff. He reports current alcohol use. He reports that he does not use drugs.   Family History:  His family history includes Cancer in his father; Heart disease in his mother; Parkinson's disease in his sister. There is no history of Colon cancer, Stomach cancer, Esophageal cancer, Inflammatory bowel disease, Liver disease, Pancreatic cancer, or Rectal cancer.   Allergies Allergies  Allergen Reactions   Erythromycin Rash, Dermatitis and Other (See Comments)    Other Reaction(s): Unknown  Stomach Cramps  erythromycin   Penicillins Rash, Dermatitis, Hives and Other (See Comments)    Other Reaction(s): Unknown  Product containing  penicillin (product)   Azithromycin Other (See Comments)    GI upset  azithromycin   Clam Shell Nausea And Vomiting   Oyster Extract Nausea And Vomiting and Other (See Comments)    clam allergenic extract     The patient is critically ill with multiple organ systems failure and requires high complexity decision making for assessment and support, frequent evaluation and titration of therapies, application of advanced monitoring technologies and extensive interpretation of multiple databases. Critical Care Time devoted to patient care services described in this note independent of APP/resident time (if applicable)  is 40 minutes.   Jennet Epley MD [facility] Personal pager: See Amion If unanswered, please page CCM On-call: #[phone]

## 2024-02-20 NOTE — Care Plan (Signed)
 Patient is intubated and moved to ICU.   Patient will remain under ICU.  Will sign off.

## 2024-02-20 NOTE — ED Provider Notes (Signed)
  I was called to bedside by nursing staff.  This patient was admitted to hospitalist service and boarding in the ED overnight.  Acute onset bradycardia and hypotension on Precedex  drip for agitation.  Improvement in oxygenation after administration of bag-valve-mask ventilation from Dr. Doretha.  Another episode of bradycardia for which epinephrine  was administered.  Improvement in heart rate and blood pressure thereafter.  RSI intubation with etomidate  rocuronium .  Confirmation of ET tube placement and OG placement.  Propofol  and fentanyl  for postintubation sedation and analgesia.  Inpatient team is aware and will contact ICU.  Remainder of care per ICU team     Procedure Name: Intubation Date/Time: 02/20/2024 7:30 AM  Performed by: Pamella Ozell LABOR, DOPre-anesthesia Checklist: Patient identified, Patient being monitored, Emergency Drugs available, Timeout performed and Suction available Oxygen Delivery Method: Non-rebreather mask Preoxygenation: Pre-oxygenation with 100% oxygen Induction Type: Rapid sequence Ventilation: Mask ventilation without difficulty Laryngoscope Size: Glidescope and 4 Grade View: Grade I Tube size: 7.5 mm Number of attempts: 1 Airway Equipment and Method: Video-laryngoscopy Placement Confirmation: ETT inserted through vocal cords under direct vision, CO2 detector and Breath sounds checked- equal and bilateral Secured at: 23 cm Tube secured with: ETT holder Comments: ET tube placement confirmed with color change breath sounds.  Postintubation x-ray taken.  Will advance ET tube 2 cm    .Critical Care  Performed by: Pamella Ozell LABOR, DO Authorized by: Pamella Ozell LABOR, DO   Critical care provider statement:    Critical care time (minutes):  30   Critical care was necessary to treat or prevent imminent or life-threatening deterioration of the following conditions:  Respiratory failure and circulatory failure   Critical care was time spent personally by me on the  following activities:  Development of treatment plan with patient or surrogate, discussions with consultants, evaluation of patient's response to treatment, examination of patient, ordering and review of laboratory studies, ordering and review of radiographic studies, ordering and performing treatments and interventions, pulse oximetry, re-evaluation of patient's condition and review of old charts     Pamella Ozell LABOR, DO 02/20/24 0800

## 2024-02-20 NOTE — Plan of Care (Signed)
   Problem: Nutrition: Goal: Adequate nutrition will be maintained Outcome: Progressing   Problem: Pain Managment: Goal: General experience of comfort will improve and/or be controlled Outcome: Progressing   Problem: Safety: Goal: Ability to remain free from injury will improve Outcome: Progressing

## 2024-02-20 NOTE — ED Notes (Signed)
 Pt appears to have dry mucous membranes and sounds muffled when saying his name. Pt able to swallow and maintain secretions. Pt given mouth swabs to moisten mouth. Pt seems to intermittently have trouble breathing. MD made aware. Pt oxygen 100% and RR 16

## 2024-02-20 NOTE — ED Notes (Signed)
 EDP at bedside

## 2024-02-20 NOTE — Progress Notes (Signed)
 Initial Nutrition Assessment  DOCUMENTATION CODES:  Not applicable  INTERVENTION:  Initiate tube feeding via OGT: Osmolite 1.5 at 50 ml/h (1200 ml per day) Start at 20 and advance by 10mL every 8 hours to reach goal Prosource TF20 60 ml 1x/d Provides 1880 kcal, 95 gm protein, 914 ml free water daily Thiamine  100mg  x 5 days   NUTRITION DIAGNOSIS:  Inadequate oral intake related to inability to eat as evidenced by NPO status.  GOAL:  Patient will meet greater than or equal to 90% of their needs  MONITOR:  TF tolerance, I & O's, Labs, Weight trends  REASON FOR ASSESSMENT:  Consult Enteral/tube feeding initiation and management  ASSESSMENT:  Pt with hx of HTN, HLD, parkinson's, GERD, and prior CVA presented to ED from ILF with agitation and erratic behavior which is very unusual for him. Required intubation in ED.   Patient is currently intubated on ventilator support. Wife at bedside able to provide a nutrition hx. Wife reports that they reside at independent living at [facility]. They do go to dining room for dinner and she has noticed pt seems to be eating a little less at meals for the last few weeks but that he does snack between and weight has been maintained at about 160 lbs.   At baseline, uses a wheelchair when out and about but will often use his feet to pull himself around. Do note muscle deficits which are more pronounced to the upper legs. Wife states it is possible he has lost some weight in the last few days, but visually looks the same.   Although muscle and fat deficits are present, hesitant to attribute to malnutrition as many of pt's muscle deficits are likely the result of normal aging combined with his parkinson's and mobility status  Consult received to initiate enteral feeds via OGT.   MV: 11.2 L/min Temp (24hrs), Avg:98.2 F (36.8 C), Min:97.4 F (36.3 C), Max:99.3 F (37.4 C) MAP (cuff):  Propofol : 11.57 ml/hr (305 kcal/d)  Admit / Current  weight: 77.1 kg    Intake/Output Summary (Last 24 hours) at 02/20/2024 1303 Last data filed at 02/20/2024 1200 Gross per 24 hour  Intake 227.67 ml  Output --  Net 227.67 ml  Net IO Since Admission: 227.67 mL [02/20/24 1303]  Drains/Lines: OGT 18 Fr. (Gastric) UOP - none recorded yet  Nutritionally Relevant Medications: Scheduled Meds:  pantoprazole  IV  40 mg Intravenous QHS   polyethylene glycol  17 g Per Tube Daily   Continuous Infusions:  lactated ringers  75 mL/hr at 02/20/24 1018   norepinephrine  (LEVOPHED ) Adult infusion     propofol  (DIPRIVAN ) infusion 25 mcg/kg/min (02/20/24 0946)   PRN Meds: docusate, ondansetron , polyethylene glycol  Labs Reviewed: CBG ranges from 104-140 mg/dL over the last 24 hours HgbA1c 5.4% (04/2021)  NUTRITION - FOCUSED PHYSICAL EXAM: Flowsheet Row Most Recent Value  Orbital Region Moderate depletion  Upper Arm Region Moderate depletion  Thoracic and Lumbar Region Mild depletion  Buccal Region Moderate depletion  Temple Region Mild depletion  Clavicle Bone Region Mild depletion  Clavicle and Acromion Bone Region Moderate depletion  Scapular Bone Region Moderate depletion  Dorsal Hand Unable to assess  [mittens]  Patellar Region Severe depletion  Anterior Thigh Region Severe depletion  Posterior Calf Region Moderate depletion  Edema (RD Assessment) None  Hair Reviewed  Eyes Unable to assess  Mouth Reviewed  Skin Reviewed  Nails Unable to assess    Diet Order:   Diet Order  Diet NPO time specified  Diet effective now                   EDUCATION NEEDS:  Education needs have been addressed  Skin:  Skin Assessment: Reviewed RN Assessment  Last BM:  unsure  Height:  Ht Readings from Last 1 Encounters:  02/20/24 6' (1.829 m)    Weight:  Wt Readings from Last 1 Encounters:  02/20/24 77.1 kg    Ideal Body Weight:  80.9 kg  BMI:  Body mass index is 23.06 kg/m.  Estimated Nutritional Needs:  Kcal:   1800-2000 kcal/d Protein:  85-100g/d Fluid:  1.8-2L/d    Vernell Lukes, RD, LDN, CNSC Registered Dietitian II Please reach out via secure chat

## 2024-02-20 NOTE — Progress Notes (Signed)
   02/20/24 2015 02/20/24 2035  Provider Notification  Provider Name/Title Ema Cramp [facility]  Date Provider Notified 02/20/24 02/20/24  Time Provider Notified 2018 2036  Method of Notification Call Call  Notification Reason Other (Comment) (verifying sinemet  IR doses/scheduling on MAR) Other (Comment) (verifying sinemet  IR doses/scheduling on [facility])  Provider response Evaluate remotely (check with RPH per Paliwal MD) No new orders (ordered correctly per med schedule at home)  Date of Provider Response 02/20/24 02/20/24  Time of Provider Response 2035 2036

## 2024-02-20 NOTE — ED Notes (Signed)
 Pt remains altered and unable to speak clearly. Pt voice sounds muffled and seems to have trouble catching his breath at times. MD aware of pt status and at bedside to assess. Pt continues to try and pull at lines and monitoring equipment.

## 2024-02-20 NOTE — Progress Notes (Signed)
 Pt transported on vent from ED to 2M03 w/o complications

## 2024-02-20 NOTE — Progress Notes (Signed)
 PROGRESS NOTE    Terry [name]  FMW:[id number] DOB: 10-Jan-1946 DOA: 02/19/2024 PCP: Burney Darice CROME, MD    Brief Narrative:  This 78 yrs old  male with PMH significant for Carotid artery stenosis, Chronic tension HA , HTN , GERD, HLD, Parkinson's disease, CVA, IBS-C, who was brought in the ED due to erratic behavior from ALF. Per notes patient was on balcony naked throwing objects of balcony. Patient on arrival  was noted to be calm after he was given baclofen, he was also alert and oriented x 4 . Patient aware of event but not able to say why it happened.  Per notes patient had new sleep medication and recent steroid injection. Wife states no prior activity like this in the past.  Patient was treated with Valium , Haldol , Seroquel  without improvement in restlessness. CT head no intracranial abnormality found.  CTA chest: No evidence of PE dissection aneurysm or significant stenosis.  Patient continued to remain agitated,  restless and desaturated requiring intubation.  Patient being moved to the ICU.  Assessment & Plan:   Principal Problem:   Delirium Active Problems:   AMS (altered mental status)  Acute delirium / Encephalopathy: Multifactorial. Acute hypoxic respiratory failure requiring mechanical ventilation. Possibly medication related , recent change in medications as well as recent steroid injection , possible occult infection with noted Leucocytosis. Continue supportive care. Hold sedative medications. Monitor fever curve and wbc count. Check inflammatory markers , Ammonia , vbg Neuroexam was nonfocal. Continue neurochecks every 8 hours. Started seroquel  q8 hour 25mg  per neurology rec precedex  for severe restlessness and agitation. Desaturated became hypoxic, emergently intubated. Patient transferred to ICU.    Parkinson disease: Continue Sinemet .   Carotid A stenosis: No active issue .   Chronic tension HA : No active issues .   HTN , uncontrolled Resume home  regimen as able. Continue PRN  GERD: Continue pantoprazole .   HLD: Resume statin.    DVT prophylaxis: Heparin  Code Status:Full code Family Communication:No family at bed side Disposition Plan:    Status is: Inpatient Remains inpatient appropriate because: Severity of illness.   Consultants:  PCCM  Procedures: Intubation  Antimicrobials:  Anti-infectives (From admission, onward)    None      Subjective: Patient was seen and examined at bedside. Patient has desaturated , requiring intubation.  Patient is sedated.  Patient is moved to ICU.  Objective: Vitals:   02/20/24 1045 02/20/24 1100 02/20/24 1110 02/20/24 1115  BP: (!) 63/47 (!) 151/91  (!) 147/90  Pulse: 86 92 84 82  Resp: 18 18 18 18   Temp: 98.4 F (36.9 C) 98.6 F (37 C) 98.6 F (37 C) 98.6 F (37 C)  TempSrc:    Axillary  SpO2: 100% 100% 100% 100%  Weight:      Height:       No intake or output data in the 24 hours ending 02/20/24 1122 Filed Weights   02/20/24 0417  Weight: 77.1 kg    Examination:  General exam: Appears calm and comfortable, intubated. Respiratory system: CTA Bilaterally.  Respiratory effort normal. RR 16 Cardiovascular system: S1 & S2 heard, RRR. No JVD, murmurs, rubs, gallops or clicks.  Gastrointestinal system: Abdomen is non distended, soft and non tender. Normal bowel sounds heard. Central nervous system: Intubated. Not assessed. Extremities: No edema, no cyanosis, no clubbing. Skin: No rashes, lesions or ulcers Psychiatry: Not assessed.    Data Reviewed: I have personally reviewed following labs and imaging studies  CBC: Recent Labs  Lab  02/19/24 1635 02/19/24 2321 02/20/24 0408 02/20/24 0846 02/20/24 0927  WBC 13.9* 12.0* 12.3*  --   --   NEUTROABS 9.9*  --   --   --   --   HGB 12.2* 11.9* 12.5* 11.6* 11.9*  HCT 35.0* 34.1* 37.3* 34.0* 35.0*  MCV 83.7 84.6 87.6  --   --   PLT 242 221 206  --   --    Basic Metabolic Panel: Recent Labs  Lab  02/19/24 1635 02/19/24 2321 02/20/24 0408 02/20/24 0846 02/20/24 0927  NA 134*  --  137 138 139  K 3.9  --  3.6 3.7 4.3  CL 101  --  105  --   --   CO2 20*  --  20*  --   --   GLUCOSE 103*  --  104*  --   --   BUN 15  --  11  --   --   CREATININE 1.00 0.93 0.90  --   --   CALCIUM  8.9  --  8.8*  --   --    GFR: Estimated Creatinine Clearance: 75 mL/min (by C-G formula based on SCr of 0.9 mg/dL). Liver Function Tests: Recent Labs  Lab 02/19/24 1635 02/20/24 0408  AST 19 17  ALT <5 <5  ALKPHOS 58 59  BILITOT 1.4* 1.9*  PROT 6.6 6.3*  ALBUMIN 4.0 3.8   No results for input(s): LIPASE, AMYLASE in the last 168 hours. Recent Labs  Lab 02/19/24 2320  AMMONIA <13   Coagulation Profile: No results for input(s): INR, PROTIME in the last 168 hours. Cardiac Enzymes: Recent Labs  Lab 02/19/24 1937  CKTOTAL 77   BNP (last 3 results) No results for input(s): PROBNP in the last 8760 hours. HbA1C: No results for input(s): HGBA1C in the last 72 hours. CBG: Recent Labs  Lab 02/20/24 1014  GLUCAP 140*   Lipid Profile: No results for input(s): CHOL, HDL, LDLCALC, TRIG, CHOLHDL, LDLDIRECT in the last 72 hours. Thyroid  Function Tests: Recent Labs    02/19/24 2321  TSH 2.374   Anemia Panel: No results for input(s): VITAMINB12, FOLATE, FERRITIN, TIBC, IRON, RETICCTPCT in the last 72 hours. Sepsis Labs: Recent Labs  Lab 02/19/24 2321  PROCALCITON <0.10    No results found for this or any previous visit (from the past 240 hours).   Radiology Studies: DG Chest Portable 1 View Result Date: 02/20/2024 CLINICAL DATA:  Endotracheal tube placement. EXAM: PORTABLE CHEST 1 VIEW COMPARISON:  10/10/2023 FINDINGS: Endotracheal tube has tip 6.2 cm above the carina. Enteric tube courses into the region of the stomach and off the film as tip is not visualized although side-port is over the stomach in the left upper quadrant. Lungs are adequately  inflated with hazy prominence of the central pulmonary vessels suggesting mild vascular congestion/edema. No evidence of effusion or pneumothorax. Cardiomediastinal silhouette and remainder of the exam is unchanged. IMPRESSION: 1. Endotracheal tube has tip 6.2 cm above the carina. 2. Mild vascular congestion/edema. Electronically Signed   By: Toribio Agreste M.D.   On: 02/20/2024 08:14   CT Head Wo Contrast Result Date: 02/19/2024 CLINICAL DATA:  Provided history: Delirium. EXAM: CT HEAD WITHOUT CONTRAST TECHNIQUE: Contiguous axial images were obtained from the base of the skull through the vertex without intravenous contrast. RADIATION DOSE REDUCTION: This exam was performed according to the departmental dose-optimization program which includes automated exposure control, adjustment of the mA and/or kV according to patient size and/or use of iterative reconstruction technique.  COMPARISON:  CT angiogram head/neck 01/07/2024. FINDINGS: Motion degraded examination. Within this limitation, findings are as follows. Brain: Generalized cerebral atrophy. There is no acute intracranial hemorrhage. No demarcated cortical infarct. No extra-axial fluid collection. No evidence of an intracranial mass. No midline shift. Vascular: No hyperdense vessel. Atherosclerotic calcifications. Skull: No calvarial fracture or aggressive osseous lesion. Sinuses/Orbits: No mass or acute finding within the imaged orbits. No significant paranasal sinus disease. Other: Trace fluid within left mastoid air cells. IMPRESSION: 1. Motion degraded examination. Within this limitation, no acute intracranial abnormality is identified. 2. Generalized cerebral atrophy. 3. Trace left mastoid effusion. Electronically Signed   By: Rockey Childs D.O.   On: 02/19/2024 18:23   CT Angio Chest/Abd/Pel for Dissection W and/or Wo Contrast Result Date: 02/19/2024 CLINICAL DATA:  Acute aortic syndrome suspected. Patient has hyperventilating with erratic behavior.  EXAM: CT ANGIOGRAPHY CHEST, ABDOMEN AND PELVIS TECHNIQUE: Non-contrast CT of the chest was initially obtained. Multidetector CT imaging through the chest, abdomen and pelvis was performed using the standard protocol during bolus administration of intravenous contrast. Multiplanar reconstructed images and MIPs were obtained and reviewed to evaluate the vascular anatomy. RADIATION DOSE REDUCTION: This exam was performed according to the departmental dose-optimization program which includes automated exposure control, adjustment of the mA and/or kV according to patient size and/or use of iterative reconstruction technique. CONTRAST:  75mL OMNIPAQUE  IOHEXOL  350 MG/ML SOLN COMPARISON:  Chest radiograph 10/10/2023. CT abdomen and pelvis 01/07/2024. CTA abdomen and pelvis 07/19/2021 FINDINGS: CTA CHEST FINDINGS Cardiovascular: Unenhanced images of the chest demonstrate calcification in the aorta and coronary arteries. No evidence of acute intramural hematoma. Images obtained during the arterial phase after contrast material administration demonstrate normal caliber thoracic aorta. Motion artifact is present but there is no evidence of aortic dissection. Great vessel origins are patent. Normal heart size. No pericardial effusions. Central pulmonary arteries appear patent without evidence of central pulmonary embolus. Mediastinum/Nodes: No enlarged mediastinal, hilar, or axillary lymph nodes. Thyroid  gland, trachea, and esophagus demonstrate no significant findings. Loop recorder in the left anterior chest wall. Lungs/Pleura: Calcified granuloma in the lingula. Lungs are otherwise clear. No pleural effusion or pneumothorax. Musculoskeletal: Degenerative changes in the spine. No acute bony abnormalities are seen. Motion artifact limits evaluation. Review of the MIP images confirms the above findings. CTA ABDOMEN AND PELVIS FINDINGS VASCULAR Aorta: Normal caliber aorta without aneurysm, dissection, vasculitis or significant  stenosis. Calcification of the aorta. Celiac: Motion artifact limits evaluation but the skin back axis appears patent despite calcification at the origin. SMA: Motion artifact limits evaluation but the superior mesenteric artery appears patent despite calcification at the origin. Renals: Duplicated right and single left renal arteries appear patent. Calcification at the origin. IMA: Patent without evidence of aneurysm, dissection, vasculitis or significant stenosis. Inflow: Patent without evidence of aneurysm, dissection, vasculitis or significant stenosis. Veins: No obvious venous abnormality within the limitations of this arterial phase study. Review of the MIP images confirms the above findings. NON-VASCULAR Hepatobiliary: No focal liver abnormality is seen. No gallstones, gallbladder wall thickening, or biliary dilatation. Pancreas: Unremarkable. No pancreatic ductal dilatation or surrounding inflammatory changes. Spleen: Normal in size without focal abnormality. Adrenals/Urinary Tract: Adrenal glands are unremarkable. Kidneys are normal, without renal calculi, focal lesion, or hydronephrosis. Bladder is unremarkable. Stomach/Bowel: Stomach, small bowel, and colon are not abnormally distended. No wall thickening or inflammatory changes are seen. Appendix is not seen. Lymphatic: No significant lymphadenopathy. Reproductive: Prostate is unremarkable. Other: No free air or free fluid in the abdomen. Abdominal  wall musculature appears intact. Musculoskeletal: Degenerative changes in the spine. No acute bony abnormalities. Review of the MIP images confirms the above findings. IMPRESSION: 1. Motion artifact limits examination. 2. No evidence of aneurysm, dissection, or significant stenosis in the thoracic or abdominal aorta. Aortic atherosclerosis. 3. No significant central pulmonary embolus is identified. 4. No evidence of active pulmonary disease. 5. No acute process demonstrated in the abdomen or pelvis.  Electronically Signed   By: Elsie Gravely M.D.   On: 02/19/2024 18:13   Scheduled Meds:  carbidopa -levodopa   1 tablet Oral QHS   carbidopa -levodopa   2 tablet Per Tube TID   carbidopa -levodopa   3 tablet Per Tube TID   Chlorhexidine  Gluconate Cloth  6 each Topical Daily   etomidate        etomidate        heparin   5,000 Units Subcutaneous Q8H   pantoprazole  (PROTONIX ) IV  40 mg Intravenous QHS   polyethylene glycol  17 g Per Tube Daily   QUEtiapine   25 mg Per Tube QHS   rocuronium        Continuous Infusions:  sodium chloride  250 mL (02/20/24 1115)   dexmedetomidine  (PRECEDEX ) IV infusion Stopped (02/20/24 0733)   fentaNYL  infusion INTRAVENOUS 25 mcg/hr (02/20/24 0801)   lactated ringers  75 mL/hr at 02/20/24 1018   norepinephrine  (LEVOPHED ) Adult infusion 2 mcg/min (02/20/24 1046)   propofol  (DIPRIVAN ) infusion 25 mcg/kg/min (02/20/24 0946)     LOS: 1 day    Time spent: 50 mins    Darcel Dawley, MD [facility]   If 7PM-7AM, please contact night-coverage

## 2024-02-20 NOTE — ED Notes (Addendum)
 This RN noticed pt making choking sounds and appearing pale. O2 saturations decreased to 30s as well as heartrate decrease to 30s and pt RR 0. This RN began manual ventilation with BVM and requested NT to alert an EDP, as well as page the admitting provider.

## 2024-02-20 NOTE — ED Notes (Signed)
 Pt continues to try and get out of bed and hitting staff. Pt threw urinal at staff when attempting to help pt urinate. Pt able to verbalize his needs and follows commands intermittently. Pt makes threats to staff when asked to lay back in bed.

## 2024-02-21 ENCOUNTER — Inpatient Hospital Stay (HOSPITAL_COMMUNITY)

## 2024-02-21 ENCOUNTER — Other Ambulatory Visit: Payer: Self-pay | Admitting: Surgery

## 2024-02-21 DIAGNOSIS — G20A1 Parkinson's disease without dyskinesia, without mention of fluctuations: Secondary | ICD-10-CM | POA: Diagnosis not present

## 2024-02-21 DIAGNOSIS — G9341 Metabolic encephalopathy: Secondary | ICD-10-CM | POA: Diagnosis not present

## 2024-02-21 DIAGNOSIS — D72829 Elevated white blood cell count, unspecified: Secondary | ICD-10-CM | POA: Diagnosis not present

## 2024-02-21 DIAGNOSIS — K551 Chronic vascular disorders of intestine: Secondary | ICD-10-CM

## 2024-02-21 LAB — CBC
HCT: 33.9 % — ABNORMAL LOW (ref 39.0–52.0)
Hemoglobin: 11.3 g/dL — ABNORMAL LOW (ref 13.0–17.0)
MCH: 28.9 pg (ref 26.0–34.0)
MCHC: 33.3 g/dL (ref 30.0–36.0)
MCV: 86.7 fL (ref 80.0–100.0)
Platelets: 177 K/uL (ref 150–400)
RBC: 3.91 MIL/uL — ABNORMAL LOW (ref 4.22–5.81)
RDW: 15.3 % (ref 11.5–15.5)
WBC: 9.3 K/uL (ref 4.0–10.5)
nRBC: 0 % (ref 0.0–0.2)

## 2024-02-21 LAB — BASIC METABOLIC PANEL WITH GFR
Anion gap: 10 (ref 5–15)
BUN: 13 mg/dL (ref 8–23)
CO2: 21 mmol/L — ABNORMAL LOW (ref 22–32)
Calcium: 8.1 mg/dL — ABNORMAL LOW (ref 8.9–10.3)
Chloride: 106 mmol/L (ref 98–111)
Creatinine, Ser: 0.83 mg/dL (ref 0.61–1.24)
GFR, Estimated: >60 mL/min (ref >60)
Glucose, Bld: 112 mg/dL — ABNORMAL HIGH (ref 70–99)
Potassium: 3.5 mmol/L (ref 3.5–5.1)
Sodium: 137 mmol/L (ref 135–145)

## 2024-02-21 LAB — GLUCOSE, CAPILLARY
Glucose-Capillary: 95 mg/dL (ref 70–99)
Glucose-Capillary: 109 mg/dL — ABNORMAL HIGH (ref 70–99)
Glucose-Capillary: 124 mg/dL — ABNORMAL HIGH (ref 70–99)

## 2024-02-21 LAB — PHOSPHORUS: Phosphorus: 3.3 mg/dL (ref 2.5–4.6)

## 2024-02-21 LAB — MAGNESIUM: Magnesium: 1.8 mg/dL (ref 1.7–2.4)

## 2024-02-21 LAB — TRIGLYCERIDES: Triglycerides: 61 mg/dL (ref <150)

## 2024-02-21 MED ORDER — ENSURE PLUS HIGH PROTEIN PO LIQD
237.0000 mL | Freq: Two times a day (BID) | ORAL | Status: DC
  Administered 2024-02-21 – 2024-02-27 (×9): 237 mL via ORAL

## 2024-02-21 MED ORDER — MAGNESIUM SULFATE 2 GM/50ML IV SOLN
2.0000 g | Freq: Once | INTRAVENOUS | Status: AC
  Administered 2024-02-21: 2 g via INTRAVENOUS
  Filled 2024-02-21: qty 50

## 2024-02-21 MED ORDER — POTASSIUM CHLORIDE 20 MEQ PO PACK
40.0000 meq | PACK | Freq: Once | ORAL | Status: AC
  Administered 2024-02-21: 40 meq
  Filled 2024-02-21: qty 2

## 2024-02-21 MED ORDER — ORAL CARE MOUTH RINSE
15.0000 mL | Freq: Three times a day (TID) | OROMUCOSAL | Status: DC
  Administered 2024-02-22 – 2024-02-27 (×18): 15 mL via OROMUCOSAL

## 2024-02-21 NOTE — Progress Notes (Signed)
 [facility] ADULT ICU REPLACEMENT PROTOCOL   The patient does apply for the [facility] Adult ICU Electrolyte Replacment Protocol based on the criteria listed below:   1.Exclusion criteria: TCTS, ECMO, Dialysis, and Myasthenia Gravis patients 2. Is GFR >/= 30 ml/min? Yes.    Patient's GFR today is >60 3. Is SCr </= 2? Yes.   Patient's SCr is 0.83 mg/dL 4. Did SCr increase >/= 0.5 in 24 hours? No. 5.Pt's weight >40kg  Yes.   6. Abnormal electrolyte(s): K+ = 3.5, Mg = 1.8  7. Electrolytes replaced per protocol 8.  Call MD STAT for K+ </= 2.5, Phos </= 1, or Mag </= 1 Physician:  Haze, eMD   Terry [name] 02/21/2024 5:56 AM

## 2024-02-21 NOTE — Plan of Care (Signed)

## 2024-02-21 NOTE — Procedures (Signed)
 Extubation Procedure Note  Patient Details:   Name: Terry [name] DOB: 1946/01/20 MRN: [id number]   Airway Documentation:    Vent end date: 02/21/24 Vent end time: 0900   Evaluation  O2 sats: stable throughout Complications: No apparent complications Patient did tolerate procedure well. Bilateral Breath Sounds: Clear, Diminished   Yes  Patient extubated per MD order. Positive cuff leak. No stridor noted. Vitals are stable on 3L [place]. RN at bedside.  Jahden Schara H Mahira Petras 02/21/2024, 9:01 AM

## 2024-02-21 NOTE — Progress Notes (Signed)
 NAME:  Terry [name], MRN:  [id number], DOB:  25-Aug-1945, LOS: 2 ADMISSION DATE:  02/19/2024, CONSULTATION DATE:  02/20/24  REFERRING MD:  Dr Leotis, CHIEF COMPLAINT:  AMS, respiratory failure   History of Present Illness:  Patient with medical history significant for Parkinson's disease, CVA, carotid artery stenosis, chronic headaches came in with altered mental status, getting agitated, erratic behavior  History of recent steroid injection for back pain and progressively more agitated over the last 4 days  Remained restless and agitated, required Precedex . While in the ED received Valium , Haldol , Seroquel  without significant improvement in restlessness This morning noted to be more altered, bradycardic and hypoxemic  Emergently intubated  Admitted to ICU  Pertinent  Medical History   Past Medical History:  Diagnosis Date   Carotid artery stenosis 04/29/2018   Chronic tension headaches 04/29/2018   Essential hypertension 04/29/2018   GERD (gastroesophageal reflux disease) 04/29/2018   HLD (hyperlipidemia) 04/29/2018   Parkinson's disease (HCC) 04/29/2018   Significant Hospital Events: Including procedures, antibiotic start and stop dates in addition to other pertinent events   7/29-presented with agitation and restlessness 7/30-emergently intubated-hypoxemia, bradycardia  Interim History / Subjective:  Sedated on propofol , tolerating ventilator support well  Objective    Blood pressure 112/63, pulse 94, temperature 99 F (37.2 C), resp. rate (!) 25, height 6' (1.829 m), weight 80.7 kg, SpO2 100%.    Vent Mode: PRVC FiO2 (%):  [40 %] 40 % Set Rate:  [18 bmp] 18 bmp Vt Set:  [620 mL] 620 mL PEEP:  [5 cmH20] 5 cmH20 Plateau Pressure:  [14 cmH20-15 cmH20] 14 cmH20   Intake/Output Summary (Last 24 hours) at 02/21/2024 1204 Last data filed at 02/21/2024 9065 Gross per 24 hour  Intake 2546.38 ml  Output 1200 ml  Net 1346.38 ml   Filed Weights   02/20/24 0417 02/21/24 0414   Weight: 77.1 kg 80.7 kg    Examination: General: Elderly, chronically ill-appearing HENT: Dry oral mucosa, endotracheal tube in place Lungs: Clear breath sounds Cardiovascular: S1-S2 appreciated Abdomen: Soft, bowel sounds appreciated Extremities: No clubbing, no edema Neuro: Off sedation communicative and alert,  GU:   Lab studies reviewed  Resolved problem list   Assessment and Plan   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  Possibly due to steroid injection but this was 2 weeks ago.  Sinemet  also recently titrated. - Now improved, communicative and alert and calm  Parkinson's disease - Appreciate neurology follow-up - Continue Sinemet   Carotid stenosis - Continue monitoring  History of hypertension - Holding home hydralazine    Leukocytosis: Leukemoid reaction favored - Urinalysis unremarkable -CT chest with no acute infiltrate - No source of infection, no antibiotics  Chronic anemia: Hgb stable  Ready for transfer out of the ICU  Best Practice (right click and Reselect all SmartList Selections daily)   Diet/type: Regular consistency (see orders) DVT prophylaxis prophylactic heparin   Pressure ulcer(s): N/A GI prophylaxis: PPI Lines: N/A Foley:  Yes, and it is still needed Code Status:  full code Last date of multidisciplinary goals of care discussion [updated wife 7/31 via phone]  Labs   CBC: Recent Labs  Lab 02/19/24 1635 02/19/24 2321 02/20/24 0408 02/20/24 0846 02/20/24 0927 02/21/24 0240  WBC 13.9* 12.0* 12.3*  --   --  9.3  NEUTROABS 9.9*  --   --   --   --   --   HGB  12.2* 11.9* 12.5* 11.6* 11.9* 11.3*  HCT 35.0* 34.1* 37.3* 34.0* 35.0* 33.9*  MCV 83.7 84.6 87.6  --   --  86.7  PLT 242 221 206  --   --  177    Basic Metabolic Panel: Recent Labs  Lab 02/19/24 1635 02/19/24 2321  02/20/24 0408 02/20/24 0846 02/20/24 0927 02/20/24 1425 02/21/24 0240  NA 134*  --  137 138 139  --  137  K 3.9  --  3.6 3.7 4.3  --  3.5  CL 101  --  105  --   --   --  106  CO2 20*  --  20*  --   --   --  21*  GLUCOSE 103*  --  104*  --   --   --  112*  BUN 15  --  11  --   --   --  13  CREATININE 1.00 0.93 0.90  --   --   --  0.83  CALCIUM  8.9  --  8.8*  --   --   --  8.1*  MG  --   --   --   --   --  1.9 1.8  PHOS  --   --   --   --   --  3.4 3.3   GFR: Estimated Creatinine Clearance: 81.8 mL/min (by C-G formula based on SCr of 0.83 mg/dL). Recent Labs  Lab 02/19/24 1635 02/19/24 2321 02/20/24 0408 02/21/24 0240  PROCALCITON  --  <0.10  --   --   WBC 13.9* 12.0* 12.3* 9.3    Liver Function Tests: Recent Labs  Lab 02/19/24 1635 02/20/24 0408  AST 19 17  ALT <5 <5  ALKPHOS 58 59  BILITOT 1.4* 1.9*  PROT 6.6 6.3*  ALBUMIN 4.0 3.8   No results for input(s): LIPASE, AMYLASE in the last 168 hours. Recent Labs  Lab 02/19/24 2320  AMMONIA <13    ABG    Component Value Date/Time   PHART 7.359 02/20/2024 0846   PCO2ART 38.0 02/20/2024 0846   PO2ART 350 (H) 02/20/2024 0846   HCO3 23.7 02/20/2024 0927   TCO2 25 02/20/2024 0927   ACIDBASEDEF 2.0 02/20/2024 0927   O2SAT 87 02/20/2024 0927     Coagulation Profile: No results for input(s): INR, PROTIME in the last 168 hours.  Cardiac Enzymes: Recent Labs  Lab 02/19/24 1937  CKTOTAL 77    HbA1C: Hemoglobin A1C  Date/Time Value Ref Range Status  12/10/2019 11:39 AM 5.8 (A) 4.0 - 5.6 % Final   Hgb A1c MFr Bld  Date/Time Value Ref Range Status  04/24/2021 05:00 AM 5.4 4.8 - 5.6 % Final    Comment:    (NOTE) Pre diabetes:          5.7%-6.4%  Diabetes:              >6.4%  Glycemic control for   <7.0% adults with diabetes     CBG: Recent Labs  Lab 02/20/24 1925 02/20/24 2257 02/21/24 0342 02/21/24 0710 02/21/24 1115  GLUCAP 112* 113* 95 109* 124*    Review of Systems:   Unable  to participate  Past Medical History:  He,  has a past medical history of Carotid artery stenosis (04/29/2018), Chronic tension headaches (04/29/2018), Essential hypertension (04/29/2018), GERD (gastroesophageal reflux disease) (04/29/2018), HLD (hyperlipidemia) (04/29/2018), and Parkinson's disease (HCC) (04/29/2018).   Surgical History:   Past Surgical History:  Procedure Laterality Date   BIOPSY  01/09/2022  Procedure: BIOPSY;  Surgeon: Wilhelmenia Aloha Raddle., MD;  Location: [facility];  Service: Gastroenterology;;  EGD and COLON   COLONOSCOPY WITH PROPOFOL  N/A 01/09/2022   Procedure: COLONOSCOPY WITH PROPOFOL ;  Surgeon: Wilhelmenia Aloha Raddle., MD;  Location: [facility];  Service: Gastroenterology;  Laterality: N/A;   ENDARTERECTOMY Left 04/27/2021   Procedure: LEFT CAROTID ENDARTERECTOMY;  Surgeon: Serene Gaile ORN, MD;  Location: [facility] OR;  Service: Vascular;  Laterality: Left;   ESOPHAGOGASTRODUODENOSCOPY (EGD) WITH PROPOFOL  N/A 01/09/2022   Procedure: ESOPHAGOGASTRODUODENOSCOPY (EGD) WITH PROPOFOL ;  Surgeon: Wilhelmenia Aloha Raddle., MD;  Location: [facility];  Service: Gastroenterology;  Laterality: N/A;   LEFT HEART CATH AND CORONARY ANGIOGRAPHY N/A 11/25/2019   Procedure: LEFT HEART CATH AND CORONARY ANGIOGRAPHY;  Surgeon: Claudene Victory ORN, MD;  Location: [facility];  Service: Cardiovascular;  Laterality: N/A;   PATCH ANGIOPLASTY Left 04/27/2021   Procedure: PATCH ANGIOPLASTY USING GEORGE BIOLOGIC PATCH;  Surgeon: Serene Gaile ORN, MD;  Location: [facility] OR;  Service: Vascular;  Laterality: Left;   TONSILLECTOMY       Social History:   reports that he has never smoked. He has never been exposed to tobacco smoke. He has quit using smokeless tobacco.  His smokeless tobacco use included snuff. He reports current alcohol use. He reports that he does not use drugs.   Family History:  His family history includes Cancer in his father; Heart disease in his mother; Parkinson's disease in his  sister. There is no history of Colon cancer, Stomach cancer, Esophageal cancer, Inflammatory bowel disease, Liver disease, Pancreatic cancer, or Rectal cancer.   Allergies Allergies  Allergen Reactions   Erythromycin Rash, Dermatitis and Other (See Comments)    Other Reaction(s): Unknown  Stomach Cramps  erythromycin   Penicillins Rash, Dermatitis, Hives and Other (See Comments)    Other Reaction(s): Unknown  Product containing penicillin (product)   Azithromycin Other (See Comments)    GI upset  azithromycin   Clam Shell Nausea And Vomiting   Oyster Extract Nausea And Vomiting and Other (See Comments)    clam allergenic extract     CRITICAL CARE Performed by: Donnice SAUNDERS Martez Weiand   Total critical care time: 31 minutes  Critical care time was exclusive of separately billable procedures and treating other patients.  Critical care was necessary to treat or prevent imminent or life-threatening deterioration.  Critical care was time spent personally by me on the following activities: development of treatment plan with patient and/or surrogate as well as nursing, discussions with consultants, evaluation of patient's response to treatment, examination of patient, obtaining history from patient or surrogate, ordering and performing treatments and interventions, ordering and review of laboratory studies, ordering and review of radiographic studies, pulse oximetry and re-evaluation of patient's condition.  Donnice SAUNDERS Beals, MD See Tracey If unanswered, please page CCM On-call: #[phone]

## 2024-02-21 NOTE — Progress Notes (Signed)
 Nutrition Follow-up  DOCUMENTATION CODES:  Not applicable  INTERVENTION:  Continue current diet as ordered Encourage PO intake Ordering assistance from dinning services Continue Thiamine  100mg  x 5 days (currently day 2)  NUTRITION DIAGNOSIS:  Inadequate oral intake related to inability to eat as evidenced by NPO status. - remains applicable  GOAL:  Patient will meet greater than or equal to 90% of their needs - progressing  MONITOR:  PO intake, Supplement acceptance, Labs, Weight trends  REASON FOR ASSESSMENT:  Consult Enteral/tube feeding initiation and management  ASSESSMENT:  Pt with hx of HTN, HLD, parkinson's, GERD, and prior CVA presented to ED from ILF with agitation and erratic behavior which is very unusual for him. Required intubation in ED.   Pt resting in bed at the time of assessment. Able to be extubated this AM and is now stable to be transferred out of ICU. Discussed in rounds, pt passed bedside swallow and has a diet order. Will adjust to ordering assistance as pt will be transferred out of ICU this AM and would likely benefit from assistance with ordering his meals.   Admit weight: 77.1 kg - likely an estimate Current weight: 80.7 kg    Intake/Output Summary (Last 24 hours) at 02/21/2024 1142 Last data filed at 02/21/2024 9065 Gross per 24 hour  Intake 2644.34 ml  Output 1200 ml  Net 1444.34 ml  Net IO Since Admission: 1,574.05 mL [02/21/24 1142]  Drains/Lines: UOP - x 24 hours  Nutritionally Relevant Medications: Scheduled Meds:  pantoprazole  IV  40 mg Intravenous QHS   polyethylene glycol  17 g Per Tube Daily   thiamine   100 mg Per Tube Daily   Continuous Infusions:  norepinephrine  (LEVOPHED ) Adult infusion 2 mcg/min (02/20/24 1100)   PRN Meds: docusate, ondansetron , polyethylene glycol  Labs Reviewed: CBG ranges from 104-140 mg/dL over the last 24 hours HgbA1c 5.4% (04/2021)  NUTRITION - FOCUSED PHYSICAL EXAM: Flowsheet Row Most  Recent Value  Orbital Region Moderate depletion  Upper Arm Region Moderate depletion  Thoracic and Lumbar Region Mild depletion  Buccal Region Moderate depletion  Temple Region Mild depletion  Clavicle Bone Region Mild depletion  Clavicle and Acromion Bone Region Moderate depletion  Scapular Bone Region Moderate depletion  Dorsal Hand Unable to assess  [mittens]  Patellar Region Severe depletion  Anterior Thigh Region Severe depletion  Posterior Calf Region Moderate depletion  Edema (RD Assessment) None  Hair Reviewed  Eyes Unable to assess  Mouth Reviewed  Skin Reviewed  Nails Unable to assess    Diet Order:   Diet Order             Diet regular Room service appropriate? Yes; Fluid consistency: Thin  Diet effective now                   EDUCATION NEEDS:  Education needs have been addressed  Skin:  Skin Assessment: Reviewed RN Assessment  Last BM:  unsure  Height:  Ht Readings from Last 1 Encounters:  02/20/24 6' (1.829 m)    Weight:  Wt Readings from Last 1 Encounters:  02/21/24 80.7 kg    Ideal Body Weight:  80.9 kg  BMI:  Body mass index is 24.13 kg/m.  Estimated Nutritional Needs:  Kcal:  1800-2000 kcal/d Protein:  85-100g/d Fluid:  1.8-2L/d    Vernell Lukes, RD, LDN, CNSC Registered Dietitian II Please reach out via secure chat

## 2024-02-22 ENCOUNTER — Inpatient Hospital Stay (HOSPITAL_COMMUNITY)

## 2024-02-22 DIAGNOSIS — J9601 Acute respiratory failure with hypoxia: Secondary | ICD-10-CM | POA: Diagnosis not present

## 2024-02-22 DIAGNOSIS — R410 Disorientation, unspecified: Secondary | ICD-10-CM | POA: Diagnosis not present

## 2024-02-22 DIAGNOSIS — D72829 Elevated white blood cell count, unspecified: Secondary | ICD-10-CM | POA: Diagnosis not present

## 2024-02-22 DIAGNOSIS — G9341 Metabolic encephalopathy: Secondary | ICD-10-CM | POA: Diagnosis not present

## 2024-02-22 LAB — URINALYSIS, ROUTINE W REFLEX MICROSCOPIC
Bilirubin Urine: NEGATIVE
Glucose, UA: NEGATIVE mg/dL
Hgb urine dipstick: NEGATIVE
Ketones, ur: 5 mg/dL — AB
Leukocytes,Ua: NEGATIVE
Nitrite: NEGATIVE
Protein, ur: NEGATIVE mg/dL
Specific Gravity, Urine: 1.013 (ref 1.005–1.030)
pH: 5 (ref 5.0–8.0)

## 2024-02-22 LAB — GLUCOSE, CAPILLARY: Glucose-Capillary: 137 mg/dL — ABNORMAL HIGH (ref 70–99)

## 2024-02-22 MED ORDER — ACETAMINOPHEN 325 MG PO TABS
650.0000 mg | ORAL_TABLET | Freq: Four times a day (QID) | ORAL | Status: DC | PRN
  Administered 2024-02-22 – 2024-02-24 (×4): 650 mg via ORAL
  Filled 2024-02-22 (×4): qty 2

## 2024-02-22 MED ORDER — PANTOPRAZOLE SODIUM 40 MG PO TBEC
40.0000 mg | DELAYED_RELEASE_TABLET | Freq: Every day | ORAL | Status: DC
  Administered 2024-02-22: 40 mg via ORAL
  Filled 2024-02-22: qty 1

## 2024-02-22 MED ORDER — CARBIDOPA-LEVODOPA 25-100 MG PO TABS
2.0000 | ORAL_TABLET | Freq: Every day | ORAL | Status: DC
  Administered 2024-02-23 – 2024-02-26 (×4): 2 via ORAL
  Filled 2024-02-22 (×7): qty 2

## 2024-02-22 MED ORDER — DOCUSATE SODIUM 100 MG PO CAPS: 100.0000 mg | ORAL_CAPSULE | Freq: Two times a day (BID) | ORAL | Status: DC | PRN

## 2024-02-22 MED ORDER — POLYETHYLENE GLYCOL 3350 17 G PO PACK: 17.0000 g | PACK | Freq: Every day | ORAL | Status: DC | PRN

## 2024-02-22 MED ORDER — FENTANYL CITRATE PF 50 MCG/ML IJ SOSY
25.0000 ug | PREFILLED_SYRINGE | INTRAMUSCULAR | Status: DC | PRN
  Administered 2024-02-23: 50 ug via INTRAVENOUS
  Filled 2024-02-22: qty 1

## 2024-02-22 MED ORDER — CARBIDOPA-LEVODOPA 25-100 MG PO TABS
2.0000 | ORAL_TABLET | Freq: Three times a day (TID) | ORAL | Status: DC
  Administered 2024-02-22 – 2024-02-27 (×13): 2 via ORAL
  Filled 2024-02-22 (×15): qty 2

## 2024-02-22 MED ORDER — AMLODIPINE BESYLATE 5 MG PO TABS
5.0000 mg | ORAL_TABLET | Freq: Every day | ORAL | Status: DC
  Administered 2024-02-22 – 2024-02-27 (×5): 5 mg via ORAL
  Filled 2024-02-22 (×5): qty 1

## 2024-02-22 MED ORDER — FAMOTIDINE 20 MG PO TABS
20.0000 mg | ORAL_TABLET | Freq: Two times a day (BID) | ORAL | Status: DC
  Administered 2024-02-23: 20 mg
  Filled 2024-02-22: qty 1

## 2024-02-22 MED ORDER — ACETAMINOPHEN 650 MG RE SUPP: 650.0000 mg | Freq: Four times a day (QID) | RECTAL | Status: DC | PRN

## 2024-02-22 MED ORDER — POLYETHYLENE GLYCOL 3350 17 G PO PACK
17.0000 g | PACK | Freq: Every day | ORAL | Status: DC
  Administered 2024-02-22 – 2024-02-27 (×4): 17 g via ORAL
  Filled 2024-02-22 (×4): qty 1

## 2024-02-22 MED ORDER — CARBIDOPA-LEVODOPA 25-100 MG PO TABS
3.0000 | ORAL_TABLET | Freq: Three times a day (TID) | ORAL | Status: DC
  Administered 2024-02-22 – 2024-02-27 (×15): 3 via ORAL
  Filled 2024-02-22 (×18): qty 3

## 2024-02-22 MED ORDER — MIDAZOLAM HCL 2 MG/2ML IJ SOLN
INTRAMUSCULAR | Status: AC
  Filled 2024-02-22: qty 2

## 2024-02-22 MED ORDER — FENTANYL CITRATE PF 50 MCG/ML IJ SOSY: 25.0000 ug | PREFILLED_SYRINGE | INTRAMUSCULAR | Status: DC | PRN

## 2024-02-22 MED ORDER — FENTANYL CITRATE PF 50 MCG/ML IJ SOSY
PREFILLED_SYRINGE | INTRAMUSCULAR | Status: AC
  Administered 2024-02-23: 50 ug via INTRAVENOUS
  Filled 2024-02-22: qty 1

## 2024-02-22 MED ORDER — THIAMINE MONONITRATE 100 MG PO TABS
100.0000 mg | ORAL_TABLET | Freq: Every day | ORAL | Status: AC
  Administered 2024-02-22 – 2024-02-24 (×3): 100 mg via ORAL
  Filled 2024-02-22 (×2): qty 1

## 2024-02-22 NOTE — Evaluation (Signed)
 Occupational Therapy Evaluation Patient Details Name: Terry [name] MRN: [id number] DOB: [birth year] Today's Date: 02/22/2024   History of Present Illness   78 y.o. male presents to [facility] on 02/19/24 from [facility] for erratic behavior and AMS. Pt with acute hypoxic respiratory failure, emergently intubated from 7/29-7/31. ICU stay from 7/29-7/31. Admitted with metabolic encephalopathy. PMHx:  Parkinson's disease, CVA, carotid artery stenosis, chronic headaches     Clinical Impressions Pt admitted for above, PTA pt was living with spouse and ILF and had assist with LB ADLs and mod I for mobility with Rollator. Pt currently presenting with impaired balance, A&Ox4, needing min A +2 to pivot safely but limited by incontinence on eval. Pt also needing total A to min A+2 for ADLs, demonstrates ability to engage and initiate ADLs when prompted. OT to continue efforts to progress pt as able. Patient will benefit from continued inpatient follow up therapy, <3 hours/day at [facility] continuum of care facility.      If plan is discharge home, recommend the following:   A little help with walking and/or transfers;A lot of help with bathing/dressing/bathroom;Assistance with cooking/housework;Direct supervision/assist for financial management;Supervision due to cognitive status;Direct supervision/assist for medications management;Help with stairs or ramp for entrance     Functional Status Assessment   Patient has had a recent decline in their functional status and demonstrates the ability to make significant improvements in function in a reasonable and predictable amount of time.     Equipment Recommendations   BSC/3in1;Tub/shower seat     Recommendations for Other Services         Precautions/Restrictions   Precautions Precautions: Fall Recall of Precautions/Restrictions: Impaired Restrictions Weight Bearing Restrictions Per Provider Order: No     Mobility Bed Mobility Overal bed  mobility: Needs Assistance Bed Mobility: Rolling, Sidelying to Sit, Sit to Supine Rolling: Min assist, +2 for physical assistance Sidelying to sit: Mod assist, HOB elevated   Sit to supine: Mod assist, +2 for physical assistance   General bed mobility comments: cues to sequece bed mobility, assist with returning BLEs to bed.    Transfers Overall transfer level: Needs assistance Equipment used: Rolling walker (2 wheels) Transfers: Sit to/from Stand, Bed to chair/wheelchair/BSC Sit to Stand: Mod assist, +2 physical assistance     Step pivot transfers: Min assist, +2 safety/equipment, +2 physical assistance     General transfer comment: mod A +2 for STS from EOB, min A+2 from [facility] with cues for hand placement.      Balance Overall balance assessment: Needs assistance Sitting-balance support: No upper extremity supported, Feet supported Sitting balance-Leahy Scale: Fair Sitting balance - Comments: sitting EOB, did have one LOB to the R   Standing balance support: Bilateral upper extremity supported, Reliant on assistive device for balance Standing balance-Leahy Scale: Poor Standing balance comment: RW                           ADL either performed or assessed with clinical judgement   ADL Overall ADL's : Needs assistance/impaired Eating/Feeding: Bed level;Set up   Grooming: Sitting;Wash/dry face;Contact guard assist   Upper Body Bathing: Sitting;Contact guard assist   Lower Body Bathing: Moderate assistance;Sitting/lateral leans   Upper Body Dressing : Minimal assistance;Sitting Upper Body Dressing Details (indicate cue type and reason): doff/don gown, inc time needed Lower Body Dressing: Total assistance;Sitting/lateral leans Lower Body Dressing Details (indicate cue type and reason): don socks Toilet Transfer: Minimal assistance;+2 for physical assistance;Stand-pivot;BSC/3in1;Rolling walker (2 wheels)  Toileting- Clothing Manipulation and Hygiene: +2 for  physical assistance;Minimal assistance       Functional mobility during ADLs: Minimal assistance;Rolling walker (2 wheels);+2 for physical assistance       Vision         Perception         Praxis         Pertinent Vitals/Pain Pain Assessment Pain Assessment: Faces Faces Pain Scale: Hurts a little bit Pain Location: L shoulder Pain Descriptors / Indicators: Aching Pain Intervention(s): Limited activity within patient's tolerance, Monitored during session     Extremity/Trunk Assessment Upper Extremity Assessment Upper Extremity Assessment: RUE deficits/detail;Overall WFL for tasks assessed;LUE deficits/detail RUE Deficits / Details: AROM shoulder flexion ~ 100 deg, shoulders 4/5, elbows 5/5 LUE Deficits / Details: AROM shoulder flexion ~ 80 deg, shoulders 4/5, elbows 5/5           Communication Communication Communication: Impaired Factors Affecting Communication: Difficulty expressing self;Reduced clarity of speech   Cognition Arousal: Alert Behavior During Therapy: WFL for tasks assessed/performed Cognition: No family/caregiver present to determine baseline             OT - Cognition Comments: A&Ox4; no apparant cognitive deficits. Pt sister-in-law left in beginning of session.                 Following commands: Intact       Cueing  General Comments   Cueing Techniques: Verbal cues;Gestural cues  NT in room assisting with pt care. Pt cleaned up, continued to have loose stool despite using [facility]   Exercises     Shoulder Instructions      Home Living Family/patient expects to be discharged to:: Private residence Living Arrangements: Spouse/significant other Available Help at Discharge: Family;Available 24 hours/day Type of Home: Independent living facility Home Access: Level entry;Elevator     Home Layout: One level     Bathroom Shower/Tub: Producer, television/film/video: Handicapped height Bathroom Accessibility: Yes   Home  Equipment: Scientist, research (medical) (4 wheels);Shower seat;Grab bars - tub/shower;Grab bars - toilet          Prior Functioning/Environment Prior Level of Function : Needs assist;History of Falls (last six months)             Mobility Comments: ModI with rollator, >2 falls due to losing balance ADLs Comments: assist for LB dressing and bathing    OT Problem List: Impaired balance (sitting and/or standing);Pain;Decreased safety awareness   OT Treatment/Interventions: Therapeutic exercise;Patient/family education;Balance training;Self-care/ADL training;Therapeutic activities;DME and/or AE instruction      OT Goals(Current goals can be found in the care plan section)   Acute Rehab OT Goals Patient Stated Goal: none stated OT Goal Formulation: With patient Time For Goal Achievement: 03/07/24 Potential to Achieve Goals: Fair ADL Goals Pt Will Perform Grooming: with supervision;standing Pt Will Perform Lower Body Bathing: sitting/lateral leans;with set-up Pt Will Perform Lower Body Dressing: sit to/from stand;with min assist Pt Will Transfer to Toilet: with supervision;ambulating Pt Will Perform Tub/Shower Transfer: Shower transfer;shower seat;with contact guard assist   OT Frequency:  Min 2X/week    Co-evaluation PT/OT/SLP Co-Evaluation/Treatment: Yes Reason for Co-Treatment: For patient/therapist safety;To address functional/ADL transfers PT goals addressed during session: Mobility/safety with mobility;Balance;Proper use of DME OT goals addressed during session: ADL's and self-care;Proper use of Adaptive equipment and DME      AM-PAC OT 6 Clicks Daily Activity     Outcome Measure Help from another person eating meals?: A Little Help from another person taking  care of personal grooming?: A Little Help from another person toileting, which includes using toliet, bedpan, or urinal?: A Little Help from another person bathing (including washing, rinsing, drying)?: A  Lot Help from another person to put on and taking off regular upper body clothing?: A Little Help from another person to put on and taking off regular lower body clothing?: A Lot 6 Click Score: 16   End of Session Equipment Utilized During Treatment: Gait belt;Rolling walker (2 wheels) Nurse Communication: Mobility status  Activity Tolerance: Patient tolerated treatment well Patient left: in bed;with call bell/phone within reach;with bed alarm set;with nursing/sitter in room  OT Visit Diagnosis: Unsteadiness on feet (R26.81);Other abnormalities of gait and mobility (R26.89);Other symptoms and signs involving cognitive function;Pain;History of falling (Z91.81) Pain - Right/Left: Left Pain - part of body: Shoulder                Time: 8743-8672 OT Time Calculation (min): 31 min Charges:  OT General Charges $OT Visit: 1 Visit OT Evaluation $OT Eval Low Complexity: 1 Low  02/22/2024  AB, OTR/L  Acute Rehabilitation Services  Office: [phone]   Curtistine JONETTA Das 02/22/2024, 1:53 PM

## 2024-02-22 NOTE — Progress Notes (Signed)
 PROGRESS NOTE    Terry [name]  FMW:[id number] DOB: 05-13-1946 DOA: 02/19/2024 PCP: Burney Darice CROME, MD  Outpatient Specialists:     Brief Narrative:  Patient is a 78 year old male with past medical history significant for Parkinson's disease, carotid artery stenosis, headache, essential hypertension, hyperlipidemia and GERD.  Patient was initially admitted to ICU team with erratic behavior, and acute delirium/encephalopathy NOS.  Patient was initially on Precedex  for severe restlessness and agitation.  Patient was initially intubated, not extubated.  [facility] assumed care today, 02/22/2024.  03/12/2024: Patient seen.  Patient remains confused.  No significant history from patient.  Workup done so far revealed moderately elevated blood pressure, blood sugar of 95-1 24, CO2 of 21, BUN of 13, creatinine of 0.83, initial WBC of 12, but none improved to 9.3, hemoglobin of 11.3, hematocrit of 33.9 and platelet count of 177, UA revealed ketones of 5, negative nitrate and leukocytosis, negative UDS, MRSA positive, CT head without contrast and CT angio chest/abdomen/pelvis without any acute findings and negative chest x-ray.   Assessment & Plan:   Principal Problem:   Delirium Active Problems:   AMS (altered mental status)   Acute delirium/Encephalopathy nos  - Etiology unclear. - Possible medication related , recent change in medications as well as recent steroid injection , possible occult infection with noted increase wbc - Initially managed in ICU setting with intubation and Precedex . - Patient has been extubated and transferred to [facility]. - No significant findings today (see above documentation).   -Continue supportive care.  Parkinson disease - Continue Sinemet    Carotid A stenosis -no active issue    Chronic tension HA  -no active issues    HTN , uncontrolled - Continue to optimize.    GERD -ppi   Hyperlipidemia:   DVT prophylaxis: SCD. Code Status: Full code.  Have low  threshold to consult palliative care team Family Communication:  Disposition Plan: Inpatient.   Consultants:  Patient was under the ICU team  Procedures:  Intubated and extubated  Antimicrobials:  None   Subjective: No significant history from patient.  Patient is confused.  Objective: Vitals:   02/22/24 0500 02/22/24 0727 02/22/24 1124 02/22/24 1552  BP: (!) 170/62 (!) 155/68 (!) 168/59 (!) 168/69  Pulse: 84 74 81 98  Resp: 20 17 18 18   Temp: 99.6 F (37.6 C) 99 F (37.2 C) 99.2 F (37.3 C) 98.9 F (37.2 C)  TempSrc: Axillary Oral Oral   SpO2: 97% 96% 100% 96%  Weight: 78.7 kg     Height:        Intake/Output Summary (Last 24 hours) at 02/22/2024 1827 Last data filed at 02/22/2024 1647 Gross per 24 hour  Intake 980 ml  Output 525 ml  Net 455 ml   Filed Weights   02/20/24 0417 02/21/24 0414 02/22/24 0500  Weight: 77.1 kg 80.7 kg 78.7 kg    Examination:  General exam: Patient is confused.  Patient is awake.  Respiratory system:  Respiratory effort normal. Cardiovascular system: S1 & S2  Gastrointestinal system: Abdomen is soft and nontender. Central nervous system: Awake and alert  Data Reviewed: I have personally reviewed following labs and imaging studies  CBC: Recent Labs  Lab 02/19/24 1635 02/19/24 2321 02/20/24 0408 02/20/24 0846 02/20/24 0927 02/21/24 0240  WBC 13.9* 12.0* 12.3*  --   --  9.3  NEUTROABS 9.9*  --   --   --   --   --   HGB 12.2* 11.9* 12.5* 11.6* 11.9* 11.3*  HCT  35.0* 34.1* 37.3* 34.0* 35.0* 33.9*  MCV 83.7 84.6 87.6  --   --  86.7  PLT 242 221 206  --   --  177   Basic Metabolic Panel: Recent Labs  Lab 02/19/24 1635 02/19/24 2321 02/20/24 0408 02/20/24 0846 02/20/24 0927 02/20/24 1425 02/21/24 0240  NA 134*  --  137 138 139  --  137  K 3.9  --  3.6 3.7 4.3  --  3.5  CL 101  --  105  --   --   --  106  CO2 20*  --  20*  --   --   --  21*  GLUCOSE 103*  --  104*  --   --   --  112*  BUN 15  --  11  --   --   --   13  CREATININE 1.00 0.93 0.90  --   --   --  0.83  CALCIUM  8.9  --  8.8*  --   --   --  8.1*  MG  --   --   --   --   --  1.9 1.8  PHOS  --   --   --   --   --  3.4 3.3   GFR: Estimated Creatinine Clearance: 81.8 mL/min (by C-G formula based on SCr of 0.83 mg/dL). Liver Function Tests: Recent Labs  Lab 02/19/24 1635 02/20/24 0408  AST 19 17  ALT <5 <5  ALKPHOS 58 59  BILITOT 1.4* 1.9*  PROT 6.6 6.3*  ALBUMIN 4.0 3.8   No results for input(s): LIPASE, AMYLASE in the last 168 hours. Recent Labs  Lab 02/19/24 2320  AMMONIA <13   Coagulation Profile: No results for input(s): INR, PROTIME in the last 168 hours. Cardiac Enzymes: Recent Labs  Lab 02/19/24 1937  CKTOTAL 77   BNP (last 3 results) No results for input(s): PROBNP in the last 8760 hours. HbA1C: No results for input(s): HGBA1C in the last 72 hours. CBG: Recent Labs  Lab 02/20/24 1925 02/20/24 2257 02/21/24 0342 02/21/24 0710 02/21/24 1115  GLUCAP 112* 113* 95 109* 124*   Lipid Profile: Recent Labs    02/21/24 0240  TRIG 61   Thyroid  Function Tests: Recent Labs    02/19/24 2321  TSH 2.374   Anemia Panel: No results for input(s): VITAMINB12, FOLATE, FERRITIN, TIBC, IRON, RETICCTPCT in the last 72 hours. Urine analysis:    Component Value Date/Time   COLORURINE YELLOW 02/19/2024 1839   APPEARANCEUR CLEAR 02/19/2024 1839   LABSPEC 1.020 02/19/2024 1839   PHURINE 6.0 02/19/2024 1839   GLUCOSEU NEGATIVE 02/19/2024 1839   HGBUR NEGATIVE 02/19/2024 1839   BILIRUBINUR NEGATIVE 02/19/2024 1839   KETONESUR 5 (A) 02/19/2024 1839   PROTEINUR NEGATIVE 02/19/2024 1839   NITRITE NEGATIVE 02/19/2024 1839   LEUKOCYTESUR NEGATIVE 02/19/2024 1839   Sepsis Labs: @LABRCNTIP (procalcitonin:4,lacticidven:4)  ) Recent Results (from the past 240 hours)  MRSA Next Gen by PCR, Nasal     Status: Abnormal   Collection Time: 02/20/24 10:15 AM   Specimen: Nasal Mucosa; Nasal Swab  Result  Value Ref Range Status   MRSA by PCR Next Gen DETECTED (A) NOT DETECTED Final    Comment: RESULT CALLED TO, READ BACK BY AND VERIFIED WITH: RN IRIS NGODA ON 02/20/24 @ 1228 BY DRT (NOTE) The GeneXpert MRSA Assay (FDA approved for NASAL specimens only), is one component of a comprehensive MRSA colonization surveillance program. It is not intended to diagnose MRSA  infection nor to guide or monitor treatment for MRSA infections. Test performance is not FDA approved in patients less than 63 years old. Performed at [facility], [address], [place], [place] [address]          Radiology Studies: DG Chest Port 1 View Result Date: 02/21/2024 EXAM: 1 VIEW XRAY OF THE CHEST 02/21/2024 06:04:00 AM COMPARISON: 02/20/2024 CLINICAL HISTORY: Tube. Encounter for tube. FINDINGS: LUNGS AND PLEURA: No focal pulmonary opacity. No pulmonary edema. No pleural effusion. No pneumothorax. HEART AND MEDIASTINUM: No acute abnormality of the cardiac and mediastinal silhouettes. Loop recorder is noted in the projection of the left side of the heart. BONES AND SOFT TISSUES: No acute osseous abnormality. LINES AND TUBES: The ET tube tip is 4.9 cm above the carina. There is an enteric tube with sidebend below the level of the GE junction. IMPRESSION: 1. No acute cardiopulmonary pathology. 2. ET tube and enteric tube in appropriate position. Loop recorder in the projection of the left side of the heart. Electronically signed by: Waddell Calk MD 02/21/2024 06:17 AM EDT RP Workstation: [id number]        Scheduled Meds:  carbidopa -levodopa   2 tablet Oral TID   carbidopa -levodopa   2 tablet Oral QHS   carbidopa -levodopa   3 tablet Oral TID   Chlorhexidine  Gluconate Cloth  6 each Topical Daily   feeding supplement  237 mL Oral BID BM   heparin   5,000 Units Subcutaneous Q8H   mupirocin  ointment   Nasal BID   mouth rinse  15 mL Mouth Rinse TID PC & HS   pantoprazole   40 mg Oral Daily   polyethylene glycol   17 g Oral Daily   rotigotine   1 patch Transdermal Daily   thiamine   100 mg Oral Daily   Continuous Infusions:  dexmedetomidine  (PRECEDEX ) IV infusion Stopped (02/20/24 0721)     LOS: 3 days    Time spent: 55 minutes    Leatrice Chapel, MD  [facility] Pager #: [phone] 7PM-7AM contact night coverage as above

## 2024-02-22 NOTE — Evaluation (Signed)
 Physical Therapy Evaluation Patient Details Name: Terry [name] MRN: [id number] DOB: 02/14/1946 Today's Date: 02/22/2024  History of Present Illness  78 y.o. male presents to [facility] on 02/19/24 from [facility] for erratic behavior and AMS. Pt with acute hypoxic respiratory failure, emergently intubated from 7/29-7/31. ICU stay from 7/29-7/31. Admitted with metabolic encephalopathy. PMHx:  Parkinson's disease, CVA, carotid artery stenosis, chronic headaches   Clinical Impression  Pt in bed upon arrival and agreeable to PT eval. PTA, pt was ModI with rollator and history of multiple falls due to LOB. In today's session, pt required up to ModAx2 for bed mobility and ModAx2 to stand with RW. Able to perform step-pivot to/from the [facility] with MinAx2 and RW. Limited session due to bowel/bladder incontinence with TotalA required for pericare. Recommending post-acute rehab <3hrs to work towards independence with mobility and prevent future falls. Pt would benefit from acute skilled PT with current functional limitations listed below (see PT Problem List). Acute PT to follow.         If plan is discharge home, recommend the following: A lot of help with walking and/or transfers;A lot of help with bathing/dressing/bathroom;Assistance with cooking/housework;Assist for transportation;Help with stairs or ramp for entrance   Can travel by private vehicle   No    Equipment Recommendations None recommended by PT     Functional Status Assessment Patient has had a recent decline in their functional status and demonstrates the ability to make significant improvements in function in a reasonable and predictable amount of time.     Precautions / Restrictions Precautions Precautions: Fall Recall of Precautions/Restrictions: Impaired Precaution/Restrictions Comments: incontinent. Restrictions Weight Bearing Restrictions Per Provider Order: No      Mobility  Bed Mobility Overal bed mobility: Needs Assistance Bed  Mobility: Rolling, Sidelying to Sit, Sit to Supine Rolling: Min assist, +2 for physical assistance Sidelying to sit: Mod assist, HOB elevated   Sit to supine: Mod assist, +2 for physical assistance   General bed mobility comments: cues to sequece bed mobility, assist with returning BLEs to bed.    Transfers Overall transfer level: Needs assistance Equipment used: Rolling walker (2 wheels) Transfers: Sit to/from Stand, Bed to chair/wheelchair/BSC Sit to Stand: Mod assist, +2 physical assistance   Step pivot transfers: Min assist, +2 safety/equipment, +2 physical assistance     General transfer comment: mod A +2 for STS from EOB, min A+2 from [facility] with cues for hand placement.    Ambulation/Gait    General Gait Details: deferred due to incontinence    Balance Overall balance assessment: Needs assistance Sitting-balance support: No upper extremity supported, Feet supported Sitting balance-Leahy Scale: Fair Sitting balance - Comments: sitting EOB, did have one LOB to the R   Standing balance support: Bilateral upper extremity supported, Reliant on assistive device for balance Standing balance-Leahy Scale: Poor Standing balance comment: reliant on UE and external support        Pertinent Vitals/Pain Pain Assessment Pain Assessment: Faces Faces Pain Scale: Hurts a little bit Pain Location: L shoulder Pain Descriptors / Indicators: Aching Pain Intervention(s): Limited activity within patient's tolerance, Monitored during session, Repositioned    Home Living Family/patient expects to be discharged to:: Private residence Living Arrangements: Spouse/significant other Available Help at Discharge: Family;Available 24 hours/day Type of Home: Independent living facility Home Access: Level entry;Elevator    Home Layout: One level Home Equipment: Scientist, research (medical) (4 wheels);Shower seat;Grab bars - tub/shower;Grab bars - toilet      Prior Function Prior Level of  Function :  Needs assist;History of Falls (last six months)    Mobility Comments: ModI with rollator, >2 falls due to losing balance ADLs Comments: assist for LB dressing and bathing     Extremity/Trunk Assessment   Upper Extremity Assessment Upper Extremity Assessment: Defer to OT evaluation RUE Deficits / Details: AROM shoulder flexion ~ 100 deg, shoulders 4/5, elbows 5/5 LUE Deficits / Details: AROM shoulder flexion ~ 80 deg, shoulders 4/5, elbows 5/5    Lower Extremity Assessment Lower Extremity Assessment: Generalized weakness    Cervical / Trunk Assessment Cervical / Trunk Assessment: Kyphotic  Communication   Communication Communication: Impaired Factors Affecting Communication: Difficulty expressing self;Reduced clarity of speech    Cognition Arousal: Alert Behavior During Therapy: WFL for tasks assessed/performed   PT - Cognitive impairments: Problem solving, Safety/Judgement    Following commands: Intact       Cueing Cueing Techniques: Verbal cues, Gestural cues     General Comments General comments (skin integrity, edema, etc.): NT in room assisting with pt care. Pt cleaned up, continued to have loose stool despite using [facility]     PT Assessment Patient needs continued PT services  PT Problem List Decreased strength;Decreased activity tolerance;Decreased mobility;Decreased balance       PT Treatment Interventions DME instruction;Gait training;Functional mobility training;Therapeutic activities;Therapeutic exercise;Neuromuscular re-education;Balance training;Patient/family education    PT Goals (Current goals can be found in the Care Plan section)  Acute Rehab PT Goals Patient Stated Goal: to get more therapy PT Goal Formulation: With patient Time For Goal Achievement: 03/07/24 Potential to Achieve Goals: Good    Frequency Min 2X/week     Co-evaluation PT/OT/SLP Co-Evaluation/Treatment: Yes Reason for Co-Treatment: For patient/therapist safety;To  address functional/ADL transfers PT goals addressed during session: Mobility/safety with mobility;Balance;Proper use of DME OT goals addressed during session: ADL's and self-care;Proper use of Adaptive equipment and DME       AM-PAC PT 6 Clicks Mobility  Outcome Measure Help needed turning from your back to your side while in a flat bed without using bedrails?: A Lot Help needed moving from lying on your back to sitting on the side of a flat bed without using bedrails?: A Lot Help needed moving to and from a bed to a chair (including a wheelchair)?: Total Help needed standing up from a chair using your arms (e.g., wheelchair or bedside chair)?: Total Help needed to walk in hospital room?: Total Help needed climbing 3-5 steps with a railing? : Total 6 Click Score: 8    End of Session Equipment Utilized During Treatment: Gait belt Activity Tolerance: Patient tolerated treatment well Patient left: in bed;with call bell/phone within reach;with nursing/sitter in room Nurse Communication: Mobility status PT Visit Diagnosis: Unsteadiness on feet (R26.81);Other abnormalities of gait and mobility (R26.89);Muscle weakness (generalized) (M62.81);History of falling (Z91.81)    Time: 8744-8672 PT Time Calculation (min) (ACUTE ONLY): 32 min   Charges:   PT Evaluation $PT Eval Low Complexity: 1 Low   PT General Charges $$ ACUTE PT VISIT: 1 Visit       Kate ORN, PT, DPT Secure Chat [facility] [phone]   Kate BRAVO Wendolyn 02/22/2024, 2:50 PM

## 2024-02-22 NOTE — Progress Notes (Signed)
 NAME:  Renold Kozar, MRN:  [id number], DOB:  [birth year], LOS: 3 ADMISSION DATE:  02/19/2024, CONSULTATION DATE:  02/20/24  REFERRING MD:  Dr Leotis, CHIEF COMPLAINT:  AMS, respiratory failure   History of Present Illness:  Patient with medical history significant for Parkinson's disease, CVA, carotid artery stenosis, chronic headaches came in with altered mental status, getting agitated, erratic behavior  History of recent steroid injection for back pain and progressively more agitated over the last 4 days  Remained restless and agitated, required Precedex . While in the ED received Valium , Haldol , Seroquel  without significant improvement in restlessness This morning noted to be more altered, bradycardic and hypoxemic  Emergently intubated  Admitted to ICU  Patient extubated on 7/31 and transferred out of icu.   On 8/1 patient w/ agitation delirium requiring prn haldol . On floor being apneic and required to be intubated. Brought back to icu.  Pccm consulted  Pertinent  Medical History   Past Medical History:  Diagnosis Date   Carotid artery stenosis 04/29/2018   Chronic tension headaches 04/29/2018   Essential hypertension 04/29/2018   GERD (gastroesophageal reflux disease) 04/29/2018   HLD (hyperlipidemia) 04/29/2018   Parkinson's disease (HCC) 04/29/2018   Significant Hospital Events: Including procedures, antibiotic start and stop dates in addition to other pertinent events   7/29-presented with agitation and restlessness 7/30-emergently intubated-hypoxemia, bradycardia 7/31 extubated; transferred out of icu 8/1 respiratory arrest intubated on floor  Interim History / Subjective:    Objective    Blood pressure (!) 189/86, pulse 85, temperature 98.9 F (37.2 C), temperature source Oral, resp. rate 18, height 6' (1.829 m), weight 78.7 kg, SpO2 93%.        Intake/Output Summary (Last 24 hours) at 02/22/2024 2354 Last data filed at 02/22/2024 1647 Gross per 24 hour  Intake  920 ml  Output 525 ml  Net 395 ml   Filed Weights   02/20/24 0417 02/21/24 0414 02/22/24 0500  Weight: 77.1 kg 80.7 kg 78.7 kg    Examination: General: critically ill appearing on mech vent HEENT: MM pink/moist; ETT in place Neuro: follows commands; MAE CV: s1s2, RRR, no m/r/g PULM:  dim clear BS bilaterally; on mech vent PRVC GI: soft, bsx4 active Extremities: warm/dry, no edema  Skin: some abd bruising likely from heparin  subcu injections   Resolved problem list   Assessment and Plan   Acute respiratory failure -intubated on admission due to ams; extubated 7/31; re-intubated 8/1 Plan: -cxr w/ no evidence of infiltrate/aspiration; ETT 7 cm above carina; advance ETT 4 cm -LTVV strategy with tidal volumes of 6-8 cc/kg ideal body weight -check ABG and adjust settings accordingly  -Wean PEEP/FiO2 for SpO2 >92% -VAP bundle in place -Daily SAT and SBT -PAD protocol in place -wean sedation for RASS goal 0 to -1 -Follow intermittent CXR and ABG PRN  Metabolic encephalopathy: Agitation query new presentation of hyperactive delirium.  Possibly due to steroid injection but this was 2 weeks ago.  Sinemet  also recently titrated. Delirium Plan: -limit sedating meds -delirium precautions  Parkinson's disease Plan: - Appreciate neurology follow-up - Continue Sinemet   Carotid stenosis Plan: - Continue monitoring  History of hypertension Plan: -cont norvasc  -prn hydralazine   Leukocytosis: Leukemoid reaction favored - Urinalysis unremarkable -CT chest with no acute infiltrate - No source of infection, no antibiotics Plan: -cxr -trend wbc/fever curve  Chronic anemia: Hgb stable Plan: -trend cbc    Best Practice (right click and Reselect all SmartList Selections daily)   Diet/type: NPO w/ meds via tube  DVT prophylaxis prophylactic heparin   Pressure ulcer(s): N/A GI prophylaxis: PPI Lines: N/A Foley:  Yes, and it is still needed Code Status:  full  code Last date of multidisciplinary goals of care discussion [update wife over phone on 8/2]  Labs   CBC: Recent Labs  Lab 02/19/24 1635 02/19/24 2321 02/20/24 0408 02/20/24 0846 02/20/24 0927 02/21/24 0240  WBC 13.9* 12.0* 12.3*  --   --  9.3  NEUTROABS 9.9*  --   --   --   --   --   HGB 12.2* 11.9* 12.5* 11.6* 11.9* 11.3*  HCT 35.0* 34.1* 37.3* 34.0* 35.0* 33.9*  MCV 83.7 84.6 87.6  --   --  86.7  PLT 242 221 206  --   --  177    Basic Metabolic Panel: Recent Labs  Lab 02/19/24 1635 02/19/24 2321 02/20/24 0408 02/20/24 0846 02/20/24 0927 02/20/24 1425 02/21/24 0240  NA 134*  --  137 138 139  --  137  K 3.9  --  3.6 3.7 4.3  --  3.5  CL 101  --  105  --   --   --  106  CO2 20*  --  20*  --   --   --  21*  GLUCOSE 103*  --  104*  --   --   --  112*  BUN 15  --  11  --   --   --  13  CREATININE 1.00 0.93 0.90  --   --   --  0.83  CALCIUM  8.9  --  8.8*  --   --   --  8.1*  MG  --   --   --   --   --  1.9 1.8  PHOS  --   --   --   --   --  3.4 3.3   GFR: Estimated Creatinine Clearance: 81.8 mL/min (by C-G formula based on SCr of 0.83 mg/dL). Recent Labs  Lab 02/19/24 1635 02/19/24 2321 02/20/24 0408 02/21/24 0240  PROCALCITON  --  <0.10  --   --   WBC 13.9* 12.0* 12.3* 9.3    Liver Function Tests: Recent Labs  Lab 02/19/24 1635 02/20/24 0408  AST 19 17  ALT <5 <5  ALKPHOS 58 59  BILITOT 1.4* 1.9*  PROT 6.6 6.3*  ALBUMIN 4.0 3.8   No results for input(s): LIPASE, AMYLASE in the last 168 hours. Recent Labs  Lab 02/19/24 2320  AMMONIA <13    ABG    Component Value Date/Time   PHART 7.359 02/20/2024 0846   PCO2ART 38.0 02/20/2024 0846   PO2ART 350 (H) 02/20/2024 0846   HCO3 23.7 02/20/2024 0927   TCO2 25 02/20/2024 0927   ACIDBASEDEF 2.0 02/20/2024 0927   O2SAT 87 02/20/2024 0927     Coagulation Profile: No results for input(s): INR, PROTIME in the last 168 hours.  Cardiac Enzymes: Recent Labs  Lab 02/19/24 1937  CKTOTAL 77     HbA1C: Hemoglobin A1C  Date/Time Value Ref Range Status  12/10/2019 11:39 AM 5.8 (A) 4.0 - 5.6 % Final   Hgb A1c MFr Bld  Date/Time Value Ref Range Status  04/24/2021 05:00 AM 5.4 4.8 - 5.6 % Final    Comment:    (NOTE) Pre diabetes:          5.7%-6.4%  Diabetes:              >6.4%  Glycemic control for   <7.0% adults with  diabetes     CBG: Recent Labs  Lab 02/20/24 2257 02/21/24 0342 02/21/24 0710 02/21/24 1115 02/22/24 2329  GLUCAP 113* 95 109* 124* 137*    Review of Systems:   Unable to participate  Past Medical History:  He,  has a past medical history of Carotid artery stenosis (04/29/2018), Chronic tension headaches (04/29/2018), Essential hypertension (04/29/2018), GERD (gastroesophageal reflux disease) (04/29/2018), HLD (hyperlipidemia) (04/29/2018), and Parkinson's disease (HCC) (04/29/2018).   Surgical History:   Past Surgical History:  Procedure Laterality Date   BIOPSY  01/09/2022   Procedure: BIOPSY;  Surgeon: Wilhelmenia Aloha Raddle., MD;  Location: [facility];  Service: Gastroenterology;;  EGD and COLON   COLONOSCOPY WITH PROPOFOL  N/A 01/09/2022   Procedure: COLONOSCOPY WITH PROPOFOL ;  Surgeon: Wilhelmenia Aloha Raddle., MD;  Location: [facility];  Service: Gastroenterology;  Laterality: N/A;   ENDARTERECTOMY Left 04/27/2021   Procedure: LEFT CAROTID ENDARTERECTOMY;  Surgeon: Serene Gaile ORN, MD;  Location: [facility];  Service: Vascular;  Laterality: Left;   ESOPHAGOGASTRODUODENOSCOPY (EGD) WITH PROPOFOL  N/A 01/09/2022   Procedure: ESOPHAGOGASTRODUODENOSCOPY (EGD) WITH PROPOFOL ;  Surgeon: Wilhelmenia Aloha Raddle., MD;  Location: [facility];  Service: Gastroenterology;  Laterality: N/A;   LEFT HEART CATH AND CORONARY ANGIOGRAPHY N/A 11/25/2019   Procedure: LEFT HEART CATH AND CORONARY ANGIOGRAPHY;  Surgeon: Claudene Victory ORN, MD;  Location: [facility];  Service: Cardiovascular;  Laterality: N/A;   PATCH ANGIOPLASTY Left 04/27/2021   Procedure: PATCH  ANGIOPLASTY USING GEORGE BIOLOGIC PATCH;  Surgeon: Serene Gaile ORN, MD;  Location: [facility] OR;  Service: Vascular;  Laterality: Left;   TONSILLECTOMY       Social History:   reports that he has never smoked. He has never been exposed to tobacco smoke. He has quit using smokeless tobacco.  His smokeless tobacco use included snuff. He reports current alcohol use. He reports that he does not use drugs.   Family History:  His family history includes Cancer in his father; Heart disease in his mother; Parkinson's disease in his sister. There is no history of Colon cancer, Stomach cancer, Esophageal cancer, Inflammatory bowel disease, Liver disease, Pancreatic cancer, or Rectal cancer.   Allergies Allergies  Allergen Reactions   Erythromycin Rash, Dermatitis and Other (See Comments)    Other Reaction(s): Unknown  Stomach Cramps  erythromycin   Penicillins Rash, Dermatitis, Hives and Other (See Comments)    Other Reaction(s): Unknown  Product containing penicillin (product)   Azithromycin Other (See Comments)    GI upset  azithromycin   Clam Shell Nausea And Vomiting   Oyster Extract Nausea And Vomiting and Other (See Comments)    clam allergenic extract     CRITICAL CARE Performed by: Norleen JONETTA Cedar   Total critical care time: 40 minutes  JD Cedar RIGGERS [facility] 02/23/2024, 12:02 AM  Please see [email or web address] for pager details.  From 7A-7P if no response, please call [phone]. After hours, please call ELink [phone].

## 2024-02-22 NOTE — Plan of Care (Signed)
   Problem: Nutrition: Goal: Adequate nutrition will be maintained Outcome: Progressing   Problem: Coping: Goal: Level of anxiety will decrease Outcome: Progressing   Problem: Elimination: Goal: Will not experience complications related to bowel motility Outcome: Progressing

## 2024-02-22 NOTE — NC FL2 (Signed)
 [place]  MEDICAID FL2 LEVEL OF CARE FORM     IDENTIFICATION  Patient Name: Terry [name] Birthdate: [birth year] Sex: male Admission Date (Current Location): 02/19/2024  [facility] and [place] Number:  Producer, television/film/video and Address:  [facility], [address], [place], [place] [address]      Provider Number: [id number]  Attending Physician Name and Address:  Rosario Leatrice FERNS, MD  Relative Name and Phone Number:       Current Level of Care: Hospital Recommended Level of Care: Skilled Nursing Facility Prior Approval Number:    Date Approved/Denied:   PASRR Number: [id number]  Discharge Plan: SNF    Current Diagnoses: Patient Active Problem List   Diagnosis Date Noted   AMS (altered mental status) 02/20/2024   Delirium 02/19/2024   Lower abdominal pain 11/11/2021   Irritable bowel syndrome with both constipation and diarrhea 11/11/2021   Change in bowel habits 11/11/2021   History of iron deficiency 11/11/2021   Constipation 09/09/2021   Gastroesophageal reflux disease 09/09/2021   Anemia 09/09/2021   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   Carotid artery occlusion 06/14/2021   Stroke (HCC) 04/24/2021   Headache 04/24/2021   Hypertensive crisis 04/23/2021   Chest tightness 10/09/2019   Shortness of breath 10/09/2019   Erectile dysfunction 03/12/2019   Essential hypertension 04/29/2018   Need for immunization against influenza 04/29/2018   Parkinson's disease (HCC) 04/29/2018   Carotid artery stenosis 04/29/2018   HLD (hyperlipidemia) 04/29/2018   GERD (gastroesophageal reflux disease) 04/29/2018   Colon cancer screening 04/29/2018   Chronic tension headaches 04/29/2018   Murmur, cardiac 02/12/2018   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   Pure hypercholesterolemia 04/28/2016    Orientation RESPIRATION BLADDER  Height & Weight     Place, Self  Normal Incontinent Weight: 173 lb 8 oz (78.7 kg) Height:  6' (182.9 cm)  BEHAVIORAL SYMPTOMS/MOOD NEUROLOGICAL BOWEL NUTRITION STATUS      Incontinent Diet (Please refer to d/c notes)  AMBULATORY STATUS COMMUNICATION OF NEEDS Skin   Extensive Assist Verbally Normal                       Personal Care Assistance Level of Assistance  Bathing, Dressing, Feeding Bathing Assistance: Limited assistance Feeding assistance: Independent Dressing Assistance: Limited assistance     Functional Limitations Info  Sight, Hearing, Speech Sight Info: Adequate Hearing Info: Adequate Speech Info: Adequate    SPECIAL CARE FACTORS FREQUENCY  PT (By licensed PT), OT (By licensed OT)     PT Frequency: 5x/week OT Frequency: 5x/week            Contractures Contractures Info: Not present    Additional Factors Info  Code Status, Allergies Code Status Info: Full Allergies Info: Erythromycin, Penicillins, Azithromycin, Clam Shell, Oyster Extract           Current Medications (02/22/2024):  This is the current hospital active medication list Current Facility-Administered Medications  Medication Dose Route Frequency Provider Last Rate Last Admin   acetaminophen  (TYLENOL ) tablet 650 mg  650 mg Oral Q6H PRN Laron Agent, RPH   650 mg at 02/22/24 1151   Or   acetaminophen  (TYLENOL ) suppository 650 mg  650 mg Rectal Q6H PRN Laron Agent, RPH       albuterol  (PROVENTIL ) (2.5 MG/3ML) 0.083% nebulizer solution 2.5 mg  2.5 mg Nebulization Q2H PRN  Debby Camila LABOR, MD       carbidopa -levodopa  (SINEMET  IR) 25-100 MG per tablet immediate release 2 tablet  2 tablet Oral TID Laron Agent, RPH   2 tablet at 02/22/24 1536   carbidopa -levodopa  (SINEMET  IR) 25-100 MG per tablet immediate release 2 tablet  2 tablet Oral QHS Laron Agent, RPH       carbidopa -levodopa  (SINEMET  IR) 25-100 MG per tablet immediate release 3 tablet  3 tablet Oral TID Laron Agent, RPH   3  tablet at 02/22/24 1151   Chlorhexidine  Gluconate Cloth 2 % PADS 6 each  6 each Topical Daily Olalere, Adewale A, MD   6 each at 02/21/24 2045   dexmedetomidine  (PRECEDEX ) 400 MCG/100ML (4 mcg/mL) infusion  0-1.2 mcg/kg/hr Intravenous Titrated Debby Camila LABOR, MD   Paused at 02/20/24 9278   docusate sodium  (COLACE) capsule 100 mg  100 mg Oral BID PRN Laron Agent, RPH       feeding supplement (ENSURE PLUS HIGH PROTEIN) liquid 237 mL  237 mL Oral BID BM Hunsucker, Donnice SAUNDERS, MD   237 mL at 02/22/24 1338   haloperidol  lactate (HALDOL ) injection 5 mg  5 mg Intramuscular Q6H PRN Debby Camila LABOR, MD   5 mg at 02/20/24 0120   heparin  injection 5,000 Units  5,000 Units Subcutaneous Q8H Debby Camila A, MD   5,000 Units at 02/22/24 1537   mupirocin  ointment (BACTROBAN ) 2 %   Nasal BID Neda Hammond A, MD   Given at 02/22/24 0940   ondansetron  (ZOFRAN ) tablet 4 mg  4 mg Oral Q6H PRN Debby Camila LABOR, MD       Or   ondansetron  (ZOFRAN ) injection 4 mg  4 mg Intravenous Q6H PRN Thomas, Sara-Maiz A, MD       Oral care mouth rinse  15 mL Mouth Rinse PRN Olalere, Adewale A, MD       Oral care mouth rinse  15 mL Mouth Rinse TID PC & HS Hunsucker, Donnice SAUNDERS, MD   15 mL at 02/22/24 1338   pantoprazole  (PROTONIX ) EC tablet 40 mg  40 mg Oral Daily Reome, Earle J, RPH   40 mg at 02/22/24 1536   polyethylene glycol (MIRALAX  / GLYCOLAX ) packet 17 g  17 g Oral Daily Laron Agent, RPH   17 g at 02/22/24 9062   polyethylene glycol (MIRALAX  / GLYCOLAX ) packet 17 g  17 g Oral Daily PRN Laron Agent, RPH       rotigotine  (NEUPRO ) 4 MG/24HR 1 patch  1 patch Transdermal Daily Olalere, Adewale A, MD   1 patch at 02/22/24 9047   thiamine  (VITAMIN B1) tablet 100 mg  100 mg Oral Daily Laron Agent, RPH   100 mg at 02/22/24 9061     Discharge Medications: Please see discharge summary for a list of discharge medications.  Relevant Imaging Results:  Relevant Lab Results:   Additional Information SSN:  [id number]  Sherline Clack, LCSWA

## 2024-02-22 NOTE — Progress Notes (Signed)
 IV team at bedside responding to Code Blue. Prior vascular accesses patent. No intervention needed from IV team.

## 2024-02-22 NOTE — TOC Progression Note (Signed)
 Transition of Care [facility]) - Progression Note    Patient Details  Name: Terry [name] MRN: [id number] Date of Birth: [birth year]  Transition of [facility]) CM/SW Contact  Almarie CHRISTELLA [place]ie, [place] Phone Number: 02/22/2024, 4:01 PM  Clinical Narrative:   Patient now recommended for SNF. CSW confirmed bed available at [facility] for SNF when ready, patient will need insurance authorization. Patient not medically ready at this time. CSW to follow.    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                                       Social Drivers of Health (SDOH) Interventions SDOH Screenings   Food Insecurity: Low Risk  (01/07/2024)   Received from [facility]  Housing: Low Risk  (01/07/2024)   Received from [facility]  Transportation Needs: No Transportation Needs (01/07/2024)   Received from [facility]  Utilities: Low Risk  (01/07/2024)   Received from [facility]  Depression (PHQ2-9): Medium Risk (12/10/2019)  Social Connections: Unknown (10/11/2019)   Received from [facility]  Tobacco Use: Medium Risk (12/10/2023)    Readmission Risk Interventions     No data to display

## 2024-02-22 NOTE — Progress Notes (Incomplete)
 NAME:  Terry [name], MRN:  [id number], DOB:  06/27/1946, LOS: 3 ADMISSION DATE:  02/19/2024, CONSULTATION DATE:  02/20/24  REFERRING MD:  Dr Leotis, CHIEF COMPLAINT:  AMS, respiratory failure   History of Present Illness:  Patient with medical history significant for Parkinson's disease, CVA, carotid artery stenosis, chronic headaches came in with altered mental status, getting agitated, erratic behavior  History of recent steroid injection for back pain and progressively more agitated over the last 4 days  Remained restless and agitated, required Precedex . While in the ED received Valium , Haldol , Seroquel  without significant improvement in restlessness This morning noted to be more altered, bradycardic and hypoxemic  Emergently intubated  Admitted to ICU  Patient extubated on 7/31 and transferred out of icu.   On 8/1 respiratory arrest intubated on floors. Brought back to icu.  Pertinent  Medical History   Past Medical History:  Diagnosis Date  . Carotid artery stenosis 04/29/2018  . Chronic tension headaches 04/29/2018  . Essential hypertension 04/29/2018  . GERD (gastroesophageal reflux disease) 04/29/2018  . HLD (hyperlipidemia) 04/29/2018  . Parkinson's disease (HCC) 04/29/2018   Significant Hospital Events: Including procedures, antibiotic start and stop dates in addition to other pertinent events   7/29-presented with agitation and restlessness 7/30-emergently intubated-hypoxemia, bradycardia 7/31 extubated; transferred out of icu 8/1 respiratory arrest intubated on floor  Interim History / Subjective:    Objective    Blood pressure (!) 189/86, pulse 85, temperature 98.9 F (37.2 C), temperature source Oral, resp. rate 18, height 6' (1.829 m), weight 78.7 kg, SpO2 93%.        Intake/Output Summary (Last 24 hours) at 02/22/2024 2354 Last data filed at 02/22/2024 1647 Gross per 24 hour  Intake 920 ml  Output 525 ml  Net 395 ml   Filed Weights   02/20/24 0417  02/21/24 0414 02/22/24 0500  Weight: 77.1 kg 80.7 kg 78.7 kg    Examination: General: Elderly, chronically ill-appearing HENT: Dry oral mucosa, endotracheal tube in place Lungs: Clear breath sounds Cardiovascular: S1-S2 appreciated Abdomen: Soft, bowel sounds appreciated Extremities: No clubbing, no edema Neuro: Off sedation communicative and alert,  GU:   Lab studies reviewed  Resolved problem list   Assessment and Plan   Acute respiratory failure -intubated on admission due to ams; extubated 7/31; re-intubated 8/1 Plan: -cxr post intubation -LTVV strategy with tidal volumes of 6-8 cc/kg ideal body weight -check ABG and adjust settings accordingly  -Wean PEEP/FiO2 for SpO2 >92% -VAP bundle in place -Daily SAT and SBT -PAD protocol in place -wean sedation for RASS goal 0 to -1 -Follow intermittent CXR and ABG PRN  Metabolic encephalopathy: Agitation query new presentation of hyperactive delirium.  Possibly due to steroid injection but this was 2 weeks ago.  Sinemet  also recently titrated. Delirium Plan: -limit sedating meds -delirium precautions  Parkinson's disease Plan: - Appreciate neurology follow-up - Continue Sinemet   Carotid stenosis Plan: - Continue monitoring  History of hypertension Plan: -cont norvasc  -prn hydralazine    Leukocytosis: Leukemoid reaction favored - Urinalysis unremarkable -CT chest with no acute infiltrate - No source of infection, no antibiotics Plan: -cxr -trend wbc/fever curve  Chronic anemia: Hgb stable Plan: -trend cbc    Best Practice (right click and Reselect all SmartList Selections daily)   Diet/type: NPO w/ meds via tube DVT prophylaxis prophylactic heparin   Pressure ulcer(s): N/A GI prophylaxis: PPI Lines: N/A Foley:  Yes, and it is still needed Code Status:  full code Last date of multidisciplinary goals of care discussion [  update wife over phone on 8/2]  Labs   CBC: Recent Labs  Lab  02/19/24 1635 02/19/24 2321 02/20/24 0408 02/20/24 0846 02/20/24 0927 02/21/24 0240  WBC 13.9* 12.0* 12.3*  --   --  9.3  NEUTROABS 9.9*  --   --   --   --   --   HGB 12.2* 11.9* 12.5* 11.6* 11.9* 11.3*  HCT 35.0* 34.1* 37.3* 34.0* 35.0* 33.9*  MCV 83.7 84.6 87.6  --   --  86.7  PLT 242 221 206  --   --  177    Basic Metabolic Panel: Recent Labs  Lab 02/19/24 1635 02/19/24 2321 02/20/24 0408 02/20/24 0846 02/20/24 0927 02/20/24 1425 02/21/24 0240  NA 134*  --  137 138 139  --  137  K 3.9  --  3.6 3.7 4.3  --  3.5  CL 101  --  105  --   --   --  106  CO2 20*  --  20*  --   --   --  21*  GLUCOSE 103*  --  104*  --   --   --  112*  BUN 15  --  11  --   --   --  13  CREATININE 1.00 0.93 0.90  --   --   --  0.83  CALCIUM  8.9  --  8.8*  --   --   --  8.1*  MG  --   --   --   --   --  1.9 1.8  PHOS  --   --   --   --   --  3.4 3.3   GFR: Estimated Creatinine Clearance: 81.8 mL/min (by C-G formula based on SCr of 0.83 mg/dL). Recent Labs  Lab 02/19/24 1635 02/19/24 2321 02/20/24 0408 02/21/24 0240  PROCALCITON  --  <0.10  --   --   WBC 13.9* 12.0* 12.3* 9.3    Liver Function Tests: Recent Labs  Lab 02/19/24 1635 02/20/24 0408  AST 19 17  ALT <5 <5  ALKPHOS 58 59  BILITOT 1.4* 1.9*  PROT 6.6 6.3*  ALBUMIN 4.0 3.8   No results for input(s): LIPASE, AMYLASE in the last 168 hours. Recent Labs  Lab 02/19/24 2320  AMMONIA <13    ABG    Component Value Date/Time   PHART 7.359 02/20/2024 0846   PCO2ART 38.0 02/20/2024 0846   PO2ART 350 (H) 02/20/2024 0846   HCO3 23.7 02/20/2024 0927   TCO2 25 02/20/2024 0927   ACIDBASEDEF 2.0 02/20/2024 0927   O2SAT 87 02/20/2024 0927     Coagulation Profile: No results for input(s): INR, PROTIME in the last 168 hours.  Cardiac Enzymes: Recent Labs  Lab 02/19/24 1937  CKTOTAL 77    HbA1C: Hemoglobin A1C  Date/Time Value Ref Range Status  12/10/2019 11:39 AM 5.8 (A) 4.0 - 5.6 % Final   Hgb A1c MFr  Bld  Date/Time Value Ref Range Status  04/24/2021 05:00 AM 5.4 4.8 - 5.6 % Final    Comment:    (NOTE) Pre diabetes:          5.7%-6.4%  Diabetes:              >6.4%  Glycemic control for   <7.0% adults with diabetes     CBG: Recent Labs  Lab 02/20/24 2257 02/21/24 0342 02/21/24 0710 02/21/24 1115 02/22/24 2329  GLUCAP 113* 95 109* 124* 137*    Review of Systems:  Unable to participate  Past Medical History:  He,  has a past medical history of Carotid artery stenosis (04/29/2018), Chronic tension headaches (04/29/2018), Essential hypertension (04/29/2018), GERD (gastroesophageal reflux disease) (04/29/2018), HLD (hyperlipidemia) (04/29/2018), and Parkinson's disease (HCC) (04/29/2018).   Surgical History:   Past Surgical History:  Procedure Laterality Date  . BIOPSY  01/09/2022   Procedure: BIOPSY;  Surgeon: Wilhelmenia Aloha Raddle., MD;  Location: [facility];  Service: Gastroenterology;;  EGD and COLON  . COLONOSCOPY WITH PROPOFOL  N/A 01/09/2022   Procedure: COLONOSCOPY WITH PROPOFOL ;  Surgeon: Wilhelmenia Aloha Raddle., MD;  Location: [facility];  Service: Gastroenterology;  Laterality: N/A;  . ENDARTERECTOMY Left 04/27/2021   Procedure: LEFT CAROTID ENDARTERECTOMY;  Surgeon: Serene Gaile ORN, MD;  Location: [facility];  Service: Vascular;  Laterality: Left;  . ESOPHAGOGASTRODUODENOSCOPY (EGD) WITH PROPOFOL  N/A 01/09/2022   Procedure: ESOPHAGOGASTRODUODENOSCOPY (EGD) WITH PROPOFOL ;  Surgeon: Wilhelmenia Aloha Raddle., MD;  Location: [facility];  Service: Gastroenterology;  Laterality: N/A;  . LEFT HEART CATH AND CORONARY ANGIOGRAPHY N/A 11/25/2019   Procedure: LEFT HEART CATH AND CORONARY ANGIOGRAPHY;  Surgeon: Claudene Victory ORN, MD;  Location: [facility];  Service: Cardiovascular;  Laterality: N/A;  . PATCH ANGIOPLASTY Left 04/27/2021   Procedure: PATCH ANGIOPLASTY USING GEORGE BIOLOGIC PATCH;  Surgeon: Serene Gaile ORN, MD;  Location: [facility];  Service: Vascular;  Laterality:  Left;  . TONSILLECTOMY       Social History:   reports that he has never smoked. He has never been exposed to tobacco smoke. He has quit using smokeless tobacco.  His smokeless tobacco use included snuff. He reports current alcohol use. He reports that he does not use drugs.   Family History:  His family history includes Cancer in his father; Heart disease in his mother; Parkinson's disease in his sister. There is no history of Colon cancer, Stomach cancer, Esophageal cancer, Inflammatory bowel disease, Liver disease, Pancreatic cancer, or Rectal cancer.   Allergies Allergies  Allergen Reactions  . Erythromycin Rash, Dermatitis and Other (See Comments)    Other Reaction(s): Unknown  Stomach Cramps  erythromycin  . Penicillins Rash, Dermatitis, Hives and Other (See Comments)    Other Reaction(s): Unknown  Product containing penicillin (product)  . Azithromycin Other (See Comments)    GI upset  azithromycin  . Clam Shell Nausea And Vomiting  . Oyster Extract Nausea And Vomiting and Other (See Comments)    clam allergenic extract     CRITICAL CARE Performed by: Norleen JONETTA Cedar   Total critical care time: 35 minutes  JD Cedar RIG[facility]ERS [facility] 02/23/2024, 12:02 AM  Please see [email or web address] for pager details.  From 7A-7P if no response, please call [phone]. After hours, please call ELink [phone].

## 2024-02-23 DIAGNOSIS — J9601 Acute respiratory failure with hypoxia: Secondary | ICD-10-CM | POA: Diagnosis not present

## 2024-02-23 LAB — GLUCOSE, CAPILLARY
Glucose-Capillary: 127 mg/dL — ABNORMAL HIGH (ref 70–99)
Glucose-Capillary: 123 mg/dL — ABNORMAL HIGH (ref 70–99)
Glucose-Capillary: 122 mg/dL — ABNORMAL HIGH (ref 70–99)
Glucose-Capillary: 74 mg/dL (ref 70–99)
Glucose-Capillary: 100 mg/dL — ABNORMAL HIGH (ref 70–99)

## 2024-02-23 LAB — CBC WITH DIFFERENTIAL/PLATELET
Abs Immature Granulocytes: 0.05 K/uL (ref 0.00–0.07)
Basophils Absolute: 0 K/uL (ref 0.0–0.1)
Basophils Relative: 0 %
Eosinophils Absolute: 0 K/uL (ref 0.0–0.5)
Eosinophils Relative: 0 %
HCT: 34.9 % — ABNORMAL LOW (ref 39.0–52.0)
Hemoglobin: 11.7 g/dL — ABNORMAL LOW (ref 13.0–17.0)
Immature Granulocytes: 0 %
Lymphocytes Relative: 12 %
Lymphs Abs: 1.4 K/uL (ref 0.7–4.0)
MCH: 29.6 pg (ref 26.0–34.0)
MCHC: 33.5 g/dL (ref 30.0–36.0)
MCV: 88.4 fL (ref 80.0–100.0)
Monocytes Absolute: 0.9 K/uL (ref 0.1–1.0)
Monocytes Relative: 8 %
Neutro Abs: 9.1 K/uL — ABNORMAL HIGH (ref 1.7–7.7)
Neutrophils Relative %: 80 %
Platelets: 200 K/uL (ref 150–400)
RBC: 3.95 MIL/uL — ABNORMAL LOW (ref 4.22–5.81)
RDW: 14.9 % (ref 11.5–15.5)
WBC: 11.5 K/uL — ABNORMAL HIGH (ref 4.0–10.5)
nRBC: 0 % (ref 0.0–0.2)

## 2024-02-23 LAB — POCT I-STAT 7, (LYTES, BLD GAS, ICA,H+H)
Acid-base deficit: 3 mmol/L — ABNORMAL HIGH (ref 0.0–2.0)
Bicarbonate: 20.7 mmol/L (ref 20.0–28.0)
Calcium, Ion: 1.11 mmol/L — ABNORMAL LOW (ref 1.15–1.40)
HCT: 29 % — ABNORMAL LOW (ref 39.0–52.0)
Hemoglobin: 9.9 g/dL — ABNORMAL LOW (ref 13.0–17.0)
O2 Saturation: 100 %
Patient temperature: 98.4
Potassium: 3.6 mmol/L (ref 3.5–5.1)
Sodium: 134 mmol/L — ABNORMAL LOW (ref 135–145)
TCO2: 22 mmol/L (ref 22–32)
pCO2 arterial: 33.2 mmHg (ref 32–48)
pH, Arterial: 7.403 (ref 7.35–7.45)
pO2, Arterial: 469 mmHg — ABNORMAL HIGH (ref 83–108)

## 2024-02-23 LAB — BASIC METABOLIC PANEL WITH GFR
Anion gap: 13 (ref 5–15)
BUN: 15 mg/dL (ref 8–23)
CO2: 17 mmol/L — ABNORMAL LOW (ref 22–32)
Calcium: 8 mg/dL — ABNORMAL LOW (ref 8.9–10.3)
Chloride: 104 mmol/L (ref 98–111)
Creatinine, Ser: 0.85 mg/dL (ref 0.61–1.24)
GFR, Estimated: >60 mL/min (ref >60)
Glucose, Bld: 203 mg/dL — ABNORMAL HIGH (ref 70–99)
Potassium: 4.4 mmol/L (ref 3.5–5.1)
Sodium: 134 mmol/L — ABNORMAL LOW (ref 135–145)

## 2024-02-23 LAB — RENAL FUNCTION PANEL
Albumin: 3 g/dL — ABNORMAL LOW (ref 3.5–5.0)
Anion gap: 10 (ref 5–15)
BUN: 13 mg/dL (ref 8–23)
CO2: 21 mmol/L — ABNORMAL LOW (ref 22–32)
Calcium: 8.5 mg/dL — ABNORMAL LOW (ref 8.9–10.3)
Chloride: 103 mmol/L (ref 98–111)
Creatinine, Ser: 0.79 mg/dL (ref 0.61–1.24)
GFR, Estimated: >60 mL/min (ref >60)
Glucose, Bld: 125 mg/dL — ABNORMAL HIGH (ref 70–99)
Phosphorus: 3 mg/dL (ref 2.5–4.6)
Potassium: 4.4 mmol/L (ref 3.5–5.1)
Sodium: 134 mmol/L — ABNORMAL LOW (ref 135–145)

## 2024-02-23 LAB — LACTIC ACID, PLASMA
Lactic Acid, Venous: 2.5 mmol/L (ref 0.5–1.9)
Lactic Acid, Venous: 1.4 mmol/L (ref 0.5–1.9)
Lactic Acid, Venous: 1.5 mmol/L (ref 0.5–1.9)

## 2024-02-23 LAB — MAGNESIUM: Magnesium: 2 mg/dL (ref 1.7–2.4)

## 2024-02-23 LAB — PROCALCITONIN: Procalcitonin: 0.25 ng/mL

## 2024-02-23 MED ORDER — BRIMONIDINE TARTRATE-TIMOLOL 0.2-0.5 % OP SOLN: 1.0000 [drp] | Freq: Two times a day (BID) | OPHTHALMIC | Status: DC

## 2024-02-23 MED ORDER — HYDRALAZINE HCL 20 MG/ML IJ SOLN: 10.0000 mg | INTRAMUSCULAR | Status: DC | PRN

## 2024-02-23 MED ORDER — TIMOLOL MALEATE 0.5 % OP SOLN
1.0000 [drp] | Freq: Two times a day (BID) | OPHTHALMIC | Status: DC
  Administered 2024-02-23 – 2024-02-27 (×9): 1 [drp] via OPHTHALMIC
  Filled 2024-02-23: qty 5

## 2024-02-23 MED ORDER — ATORVASTATIN CALCIUM 10 MG PO TABS
20.0000 mg | ORAL_TABLET | Freq: Every day | ORAL | Status: DC
  Administered 2024-02-23 – 2024-02-26 (×4): 20 mg
  Filled 2024-02-23 (×4): qty 2

## 2024-02-23 MED ORDER — FENTANYL CITRATE PF 50 MCG/ML IJ SOSY: 50.0000 ug | PREFILLED_SYRINGE | Freq: Once | INTRAMUSCULAR | Status: AC

## 2024-02-23 MED ORDER — ASPIRIN 81 MG PO CHEW
81.0000 mg | CHEWABLE_TABLET | Freq: Once | ORAL | Status: AC
  Administered 2024-02-23: 81 mg
  Filled 2024-02-23: qty 1

## 2024-02-23 MED ORDER — BRIMONIDINE TARTRATE 0.2 % OP SOLN
1.0000 [drp] | Freq: Two times a day (BID) | OPHTHALMIC | Status: DC
  Administered 2024-02-23 – 2024-02-27 (×9): 1 [drp] via OPHTHALMIC
  Filled 2024-02-23: qty 5

## 2024-02-23 MED ORDER — ENOXAPARIN SODIUM 40 MG/0.4ML IJ SOSY
40.0000 mg | PREFILLED_SYRINGE | INTRAMUSCULAR | Status: DC
  Administered 2024-02-23 – 2024-02-26 (×4): 40 mg via SUBCUTANEOUS
  Filled 2024-02-23 (×4): qty 0.4

## 2024-02-23 NOTE — Progress Notes (Signed)
 Responded to code blue page. On my arrival, pt was having agonal breathing and was being intubated. No pulse lost or CPR done. He was subsequently transferred to [facility] for further evaluation after successful intubation. Ordered Stat chest x-ray, ABG, BMP, and CBC. Family is aware.

## 2024-02-23 NOTE — Progress Notes (Signed)
 This RN was notified by Consulting civil engineer that a sitter arrived for patient. Upon attempting to give sitter report on patient, patient was found wet with urine again. While helping the sitter and NT clean the patient, this RN observed small yellow particles on patient's lips, what looked like expectorated Sinemet . Patient then started becoming apneic. This RN placed patient on 2LNC and monitored O2sat. In a very short time, patient started agonal breathing and desatting into 50s. Simple mask was placed, rapid response called, and Dr. Alfornia paged. Patient's respiratory status continued to decline and Code Blue was called. Patient was transferred to [facility] ICU.

## 2024-02-23 NOTE — Procedures (Signed)
 Extubation Procedure Note  Patient Details:   Name: Terry [name] DOB: 1946/07/03 MRN: [id number]   Airway Documentation:    Vent end date: 02/23/24 Vent end time: 1355   Evaluation  O2 sats: stable throughout Complications: No apparent complications Patient did tolerate procedure well. Bilateral Breath Sounds: Clear, Diminished   Yes  Pt extubated per physician order without complication.Pt with positive cuff leak prior. Upon extubation, pt able to speak name, give a good cough and no stridor was heard at this time. Pt placed on 3L nasal cannula.  Geofm BRAVO Paola Flynt 02/23/2024, 1:58 PM

## 2024-02-23 NOTE — Progress Notes (Addendum)
 Upon cleaning the patient from urine, patient became combative, hitting and kicking at staff, and throwing items from his meal tray. Dr. Alfornia, on call provider was notified and soft wrist restraints were requested. Safety sitter was also ordered. Patient's wife was called to notify of patient's change in status and restraints, no answer, and this RN left voicemail to call hospital. Soft wrist restraints in place.

## 2024-02-23 NOTE — Progress Notes (Signed)
.  intIntubation Procedure Note  Lynell Kussman  [id number]  1946/05/11  Date:02/23/24  Time:12:06 AM   Provider Performing:Tilley, Allean    Procedure: Intubation (31500)  Indication(s) Respiratory Failure  Consent Unable to obtain consent due to emergent nature of procedure.   Anesthesia none   Time Out Verified patient identification, verified procedure, site/side was marked, verified correct patient position, special equipment/implants available, medications/allergies/relevant history reviewed, required imaging and test results available.   Sterile Technique Usual hand hygeine, masks, and gloves were used   Procedure Description Patient positioned in bed supine.  Sedation given as noted above.  Patient was intubated with endotracheal tube using Mac 4.  View was Grade 1 full glottis .  Number of attempts was 1.  Colorimetric CO2 detector was consistent with tracheal placement.  ETT 7.5 secured at 24 at teeth.  Complications/Tolerance None; patient tolerated the procedure well. Chest X-ray is ordered to verify placement.   EBL none   Specimen(s) None

## 2024-02-23 NOTE — Progress Notes (Signed)
 NAME:  Terry [name], MRN:  [id number], DOB:  1946/05/11, LOS: 4 ADMISSION DATE:  02/19/2024, CONSULTATION DATE:  02/20/24  REFERRING MD:  Dr Leotis, CHIEF COMPLAINT:  AMS, respiratory failure   History of Present Illness:  Patient with medical history significant for Parkinson's disease, CVA, carotid artery stenosis, chronic headaches came in with altered mental status, getting agitated, erratic behavior  History of recent steroid injection for back pain and progressively more agitated over the last 4 days  Remained restless and agitated, required Precedex . While in the ED received Valium , Haldol , Seroquel  without significant improvement in restlessness This morning noted to be more altered, bradycardic and hypoxemic  Emergently intubated  Admitted to ICU  Patient extubated on 7/31 and transferred out of icu.   On 8/1 patient w/ agitation delirium requiring prn haldol . On floor being apneic and required to be intubated. Brought back to icu.  Pccm consulted  Pertinent  Medical History   Past Medical History:  Diagnosis Date   Carotid artery stenosis 04/29/2018   Chronic tension headaches 04/29/2018   Essential hypertension 04/29/2018   GERD (gastroesophageal reflux disease) 04/29/2018   HLD (hyperlipidemia) 04/29/2018   Parkinson's disease (HCC) 04/29/2018   Significant Hospital Events: Including procedures, antibiotic start and stop dates in addition to other pertinent events   7/29-presented with agitation and restlessness 7/30-emergently intubated-hypoxemia, bradycardia 7/31 extubated; transferred out of icu 8/1 respiratory arrest intubated on floor probably related to medications  Interim History / Subjective:  Patient was transferred to ICU overnight He is tolerating spontaneous breathing trial This is a second intubation this week Remained afebrile  Objective    Blood pressure 137/62, pulse 68, temperature 98 F (36.7 C), temperature source Oral, resp. rate 18, height  6' (1.829 m), weight 75.8 kg, SpO2 100%.    Vent Mode: PRVC FiO2 (%):  [30 %-100 %] 30 % Set Rate:  [18 bmp] 18 bmp Vt Set:  [620 mL] 620 mL PEEP:  [5 cmH20] 5 cmH20   Intake/Output Summary (Last 24 hours) at 02/23/2024 9166 Last data filed at 02/23/2024 0800 Gross per 24 hour  Intake 883.91 ml  Output 500 ml  Net 383.91 ml   Filed Weights   02/21/24 0414 02/22/24 0500 02/23/24 0500  Weight: 80.7 kg 78.7 kg 75.8 kg    Examination: General: Crtitically ill-appearing elderly male, orally intubated HEENT: [place]/AT, eyes anicteric.  ETT and OGT in place Neuro: Opens eyes with vocal stimuli, following simple commands, moving all 4 extremities Chest: Reduced air entry at the bases bilaterally, no wheezes Heart: Regular rate and rhythm, no murmurs or gallops Abdomen: Soft, nondistended, bowel sounds present  Labs and images reviewed  Patient Lines/Drains/Airways Status     Active Line/Drains/Airways     Name Placement date Placement time Site Days   Peripheral IV 02/20/24 20 G Distal;Posterior;Right Forearm 02/20/24  0406  Forearm  3   Peripheral IV 02/20/24 20 G Left Antecubital 02/20/24  0734  Antecubital  3   External Urinary Catheter 02/21/24  --  --  2   Airway 7.5 mm 02/22/24  2345  -- 1        Resolved problem list   Assessment and Plan  Acute respiratory failure with hypoxia, likely medication induced Patient was agitated requiring Haldol  After that he was found unresponsive requiring endotracheal intubation Continue lung protective ventilation VAP prevention bundle in place sedation per protocol with Precedex  and as needed fentanyl  Tolerating spontaneous breathing trial Follow-up respiratory culture, hold antibiotic therapy This is  a second intubation this week  Acute hyperactive delirium  Patient was agitated on the floor, required Haldol  Currently on Precedex  infusion Continue delirium precautions  Parkinson's disease Continue  Sinemet   Hypertension Continue amlodipine   Anemia of chronic disease Monitor H&H and transfuse if less than 7  Best Practice (right click and Reselect all SmartList Selections daily)   Diet/type: NPO w/ meds via tube DVT prophylaxis subcu Lovenox  Pressure ulcer(s): Please see nursing notes GI prophylaxis: H2 blocker Lines: N/A Foley:  Yes, and it is still needed Code Status:  full code Last date of multidisciplinary goals of care discussion [update wife over phone on 8/2]  Labs   CBC: Recent Labs  Lab 02/19/24 1635 02/19/24 2321 02/20/24 0408 02/20/24 0846 02/20/24 0927 02/21/24 0240 02/23/24 0040  WBC 13.9* 12.0* 12.3*  --   --  9.3  --   NEUTROABS 9.9*  --   --   --   --   --   --   HGB 12.2* 11.9* 12.5* 11.6* 11.9* 11.3* 9.9*  HCT 35.0* 34.1* 37.3* 34.0* 35.0* 33.9* 29.0*  MCV 83.7 84.6 87.6  --   --  86.7  --   PLT 242 221 206  --   --  177  --     Basic Metabolic Panel: Recent Labs  Lab 02/19/24 1635 02/19/24 2321 02/20/24 0408 02/20/24 0846 02/20/24 0927 02/20/24 1425 02/21/24 0240 02/23/24 0015 02/23/24 0040  NA 134*  --  137 138 139  --  137 134* 134*  K 3.9  --  3.6 3.7 4.3  --  3.5 4.4 3.6  CL 101  --  105  --   --   --  106 104  --   CO2 20*  --  20*  --   --   --  21* 17*  --   GLUCOSE 103*  --  104*  --   --   --  112* 203*  --   BUN 15  --  11  --   --   --  13 15  --   CREATININE 1.00 0.93 0.90  --   --   --  0.83 0.85  --   CALCIUM  8.9  --  8.8*  --   --   --  8.1* 8.0*  --   MG  --   --   --   --   --  1.9 1.8  --   --   PHOS  --   --   --   --   --  3.4 3.3  --   --    GFR: Estimated Creatinine Clearance: 78 mL/min (by C-G formula based on SCr of 0.85 mg/dL). Recent Labs  Lab 02/19/24 1635 02/19/24 2321 02/20/24 0408 02/21/24 0240 02/23/24 0015 02/23/24 0307  PROCALCITON  --  <0.10  --   --   --   --   WBC 13.9* 12.0* 12.3* 9.3  --   --   LATICACIDVEN  --   --   --   --  2.5* 1.4    Liver Function Tests: Recent Labs   Lab 02/19/24 1635 02/20/24 0408  AST 19 17  ALT <5 <5  ALKPHOS 58 59  BILITOT 1.4* 1.9*  PROT 6.6 6.3*  ALBUMIN 4.0 3.8   No results for input(s): LIPASE, AMYLASE in the last 168 hours. Recent Labs  Lab 02/19/24 2320  AMMONIA <13    ABG    Component Value Date/Time  PHART 7.403 02/23/2024 0040   PCO2ART 33.2 02/23/2024 0040   PO2ART 469 (H) 02/23/2024 0040   HCO3 20.7 02/23/2024 0040   TCO2 22 02/23/2024 0040   ACIDBASEDEF 3.0 (H) 02/23/2024 0040   O2SAT 100 02/23/2024 0040     Coagulation Profile: No results for input(s): INR, PROTIME in the last 168 hours.  Cardiac Enzymes: Recent Labs  Lab 02/19/24 1937  CKTOTAL 77    HbA1C: Hemoglobin A1C  Date/Time Value Ref Range Status  12/10/2019 11:39 AM 5.8 (A) 4.0 - 5.6 % Final   Hgb A1c MFr Bld  Date/Time Value Ref Range Status  04/24/2021 05:00 AM 5.4 4.8 - 5.6 % Final    Comment:    (NOTE) Pre diabetes:          5.7%-6.4%  Diabetes:              >6.4%  Glycemic control for   <7.0% adults with diabetes     CBG: Recent Labs  Lab 02/21/24 0710 02/21/24 1115 02/22/24 2329 02/23/24 0257 02/23/24 0726  GLUCAP 109* 124* 137* 127* 123*     The patient is critically ill due to acute respiratory failure with hypoxia.  Critical care was necessary to treat or prevent imminent or life-threatening deterioration.  Critical care was time spent personally by me on the following activities: development of treatment plan with patient and/or surrogate as well as nursing, discussions with consultants, evaluation of patient's response to treatment, examination of patient, obtaining history from patient or surrogate, ordering and performing treatments and interventions, ordering and review of laboratory studies, ordering and review of radiographic studies, pulse oximetry, re-evaluation of patient's condition and participation in multidisciplinary rounds.   During this encounter critical care time was devoted  to patient care services described in this note for 35 minutes.     Valinda Novas, MD [facility] See Amion for pager If no response to pager, please call [phone] until 7pm After 7pm, Please call E-link [phone]

## 2024-02-23 NOTE — Progress Notes (Signed)
 During shift change around 1915, patient requesting to call his wife and became agitated, yelling at staff, when asked to wait a few until handoff is finished. Patient insisted so this RN called his wife. After patient and his wife spoke, this RN spoke with patient's wife. Patient's wife stated, I will not answer calls from him anymore because I don't want him to yell at me for not being there with him. After the call, patient started yelling about wanting his wife with him. This RN attempted to reason with patient that it is night time and his wife will have a difficult time coming to the hospital at this hour. De escalation attempt was unsuccessful. PRN Haldol  was administered.

## 2024-02-23 NOTE — Code Documentation (Signed)
 I received a call stating pt was acutely hypoxic with sats 40%. I instructed them to call RRT and place pt on NRB mask. Upon my arrival, RRT Aimee and Kristen at the head of the bed assisting the patient with BVM. Pt had no gag reflex. Code blue initiated by myself for respiratory arrest. Pt was intubated by RRT at 2345 without complication. Confirmed with ETCO2 and auscultated bilateral BS. Dr. Alfornia and IMTS at bedside. Pt maintained a pulse during entire episode. No emergency drugs given during episode.  Following intubation, pt opened eyes and would localize to stimuli purposely. Pt transported via bed, monitor and oxygen to 2H17.   2350-HR 132 ST, 214/84, RR 18-20 via BVM with sats 97% on 100% Fio2  Fentanyl  50mcg IV given for transport to ICU.

## 2024-02-23 NOTE — Progress Notes (Signed)
 eLink Physician-Brief Progress Note Patient Name: Terry [name] DOB: Nov 29, 1945 MRN: [id number]   Date of Service  02/23/2024  HPI/Events of Note  78 year old male with a history of Parkinson's disease, CVA, carotid artery stenosis and chronic headaches who came in initially with erratic behavior and acute respiratory failure with hypoxemia he was mechanically ventilated and extubated on 7/31.  He was on the floor when he had a respiratory cardiac arrest and transferred back to the ICU for further management.  Patient is tachycardic and hypertensive but otherwise normal vitals.  Saturating 98% on 100% FiO2.  Results show adequate oxygenation and ventilation.  Metabolic panel with no abnormalities hemoglobin is stable.  Radiograph shows that the endotracheal tube is high, advanced to 28 cm.  eICU Interventions  Maintain mechanical ventilation, daily spontaneous awakening/breathing trials  Maintain Parkinson's meds.  Precedex  for the time being.  DVT prophylaxis with heparin  GI prophylaxis with famotidine      Intervention Category Evaluation Type: New Patient Evaluation  Terry [name] 02/23/2024, 12:36 AM

## 2024-02-24 DIAGNOSIS — J9601 Acute respiratory failure with hypoxia: Secondary | ICD-10-CM | POA: Diagnosis not present

## 2024-02-24 LAB — GLUCOSE, CAPILLARY
Glucose-Capillary: 104 mg/dL — ABNORMAL HIGH (ref 70–99)
Glucose-Capillary: 87 mg/dL (ref 70–99)

## 2024-02-24 MED ORDER — BACLOFEN 10 MG PO TABS
10.0000 mg | ORAL_TABLET | Freq: Three times a day (TID) | ORAL | Status: DC | PRN
  Administered 2024-02-24 – 2024-02-27 (×3): 10 mg via ORAL
  Filled 2024-02-24 (×3): qty 1

## 2024-02-24 MED ORDER — QUETIAPINE FUMARATE 25 MG PO TABS
25.0000 mg | ORAL_TABLET | Freq: Every day | ORAL | Status: DC
  Administered 2024-02-24: 25 mg via ORAL
  Filled 2024-02-24: qty 1

## 2024-02-24 MED ORDER — SODIUM CHLORIDE 0.9 % IV SOLN
2.0000 g | INTRAVENOUS | Status: DC
  Administered 2024-02-24 – 2024-02-27 (×4): 2 g via INTRAVENOUS
  Filled 2024-02-24 (×4): qty 20

## 2024-02-24 MED ORDER — TRAZODONE HCL 50 MG PO TABS
50.0000 mg | ORAL_TABLET | Freq: Every evening | ORAL | Status: DC | PRN
  Administered 2024-02-24 – 2024-02-25 (×2): 50 mg via ORAL
  Filled 2024-02-24 (×2): qty 1

## 2024-02-24 NOTE — Evaluation (Signed)
 Clinical/Bedside Swallow Evaluation Patient Details  Name: Terry [name] MRN: [id number] Date of Birth: 1946/06/18  Today's Date: 02/24/2024 Time: SLP Start Time (ACUTE ONLY): 0750 SLP Stop Time (ACUTE ONLY): 0802 SLP Time Calculation (min) (ACUTE ONLY): 12 min  Past Medical History:  Past Medical History:  Diagnosis Date   Carotid artery stenosis 04/29/2018   Chronic tension headaches 04/29/2018   Essential hypertension 04/29/2018   GERD (gastroesophageal reflux disease) 04/29/2018   HLD (hyperlipidemia) 04/29/2018   Parkinson's disease (HCC) 04/29/2018   Past Surgical History:  Past Surgical History:  Procedure Laterality Date   BIOPSY  01/09/2022   Procedure: BIOPSY;  Surgeon: Wilhelmenia Aloha Raddle., MD;  Location: [facility];  Service: Gastroenterology;;  EGD and COLON   COLONOSCOPY WITH PROPOFOL  N/A 01/09/2022   Procedure: COLONOSCOPY WITH PROPOFOL ;  Surgeon: Wilhelmenia Aloha Raddle., MD;  Location: [facility];  Service: Gastroenterology;  Laterality: N/A;   ENDARTERECTOMY Left 04/27/2021   Procedure: LEFT CAROTID ENDARTERECTOMY;  Surgeon: Serene Gaile ORN, MD;  Location: [facility];  Service: Vascular;  Laterality: Left;   ESOPHAGOGASTRODUODENOSCOPY (EGD) WITH PROPOFOL  N/A 01/09/2022   Procedure: ESOPHAGOGASTRODUODENOSCOPY (EGD) WITH PROPOFOL ;  Surgeon: Wilhelmenia Aloha Raddle., MD;  Location: [facility];  Service: Gastroenterology;  Laterality: N/A;   LEFT HEART CATH AND CORONARY ANGIOGRAPHY N/A 11/25/2019   Procedure: LEFT HEART CATH AND CORONARY ANGIOGRAPHY;  Surgeon: Claudene Victory ORN, MD;  Location: [facility];  Service: Cardiovascular;  Laterality: N/A;   PATCH ANGIOPLASTY Left 04/27/2021   Procedure: PATCH ANGIOPLASTY USING GEORGE BIOLOGIC PATCH;  Surgeon: Serene Gaile ORN, MD;  Location: [facility];  Service: Vascular;  Laterality: Left;   TONSILLECTOMY     HPI:  Terry [name] is a 78 yo male with PMH including Parkinson's disease, CVA, GERD, carotid artery stenosis, chronic  headaches who presents 7/29 with AMS and erratic behavior. Emergently intubated 7/30-7/31 and 8/1-8/2, thought to be secondary to medication-induced hypoxia. Pt passed the swallow screen after both extubations. Seen by SLP 01/07/24 with recommendations to continue regular diet with thin liquids.    Assessment / Plan / Recommendation  Clinical Impression  Pt denies previous difficulty swallowing but states he briefly saw an SLP for dysphonia secondary to Parkinson's disease. He reports increasing dysphonia after two intubations this week lasting two days each (after each of which, he passed the [facility] swallow screen). Subjectively, his vocal quality appears consistent with Parkinson's disease characterized by decreased volume and intensity as the sentence progresses. No signs clinically concerning for aspiration were observed with consecutive sips of water. There was mild-appearing oral residue with regular solids despite thorough mastication that pt independently cleared with a liquid wash. Discussed completing an MBS for the purpose of benchmarking current performance if he were to ever experience dysphagia, with which pt is agreeable. Recommend resuming regular diet with thin liquids and will f/u for an MBS as scheduling allows. SLP Visit Diagnosis: Dysphagia, unspecified (R13.10)    Aspiration Risk  Mild aspiration risk    Diet Recommendation Regular;Thin liquid    Liquid Administration via: Cup;Straw Medication Administration: Whole meds with liquid Supervision: Patient able to self feed Compensations: Minimize environmental distractions;Slow rate;Small sips/bites Postural Changes: Seated upright at 90 degrees;Remain upright for at least 30 minutes after po intake    Other  Recommendations Oral Care Recommendations: Oral care BID     Assistance Recommended at Discharge    Functional Status Assessment    Frequency and Duration            Prognosis  Prognosis for improved oropharyngeal  function: Good Barriers to Reach Goals: Cognitive deficits      Swallow Study   General HPI: Terry [name] is a 78 yo male with PMH including Parkinson's disease, CVA, GERD, carotid artery stenosis, chronic headaches who presents 7/29 with AMS and erratic behavior. Emergently intubated 7/30-7/31 and 8/1-8/2, thought to be secondary to medication-induced hypoxia. Pt passed the swallow screen after both extubations. Seen by SLP 01/07/24 with recommendations to continue regular diet with thin liquids. Type of Study: Bedside Swallow Evaluation Previous Swallow Assessment: see HPI Diet Prior to this Study: NPO Temperature Spikes Noted: No Respiratory Status: Room air History of Recent Intubation: Yes Total duration of intubation (days): 4 days (two x2 days) Date extubated: 02/23/24 Behavior/Cognition: Alert;Cooperative;Pleasant mood Oral Cavity Assessment: Within Functional Limits Oral Care Completed by SLP: No Oral Cavity - Dentition: Adequate natural dentition Vision: Functional for self-feeding Self-Feeding Abilities: Able to feed self Patient Positioning: Upright in bed Baseline Vocal Quality: Breathy;Low vocal intensity Volitional Cough: Strong Volitional Swallow: Able to elicit    Oral/Motor/Sensory Function Overall Oral Motor/Sensory Function: Within functional limits   Ice Chips Ice chips: Not tested   Thin Liquid Thin Liquid: Within functional limits Presentation: Straw;Self Fed    Nectar Thick Nectar Thick Liquid: Not tested   Honey Thick Honey Thick Liquid: Not tested   Puree Puree: Within functional limits Presentation: Spoon;Self Fed   Solid     Solid: Within functional limits Presentation: Self Fed      Damien Blumenthal, M.A., CCC-SLP Speech Language Pathology, Acute Rehabilitation Services  Secure Chat preferred [phone]  02/24/2024,8:19 AM

## 2024-02-24 NOTE — Progress Notes (Signed)
 NAME:  Terry [name], MRN:  [id number], DOB:  [birth year], LOS: 5 ADMISSION DATE:  02/19/2024, CONSULTATION DATE:  02/20/24  REFERRING MD:  Dr Leotis, CHIEF COMPLAINT:  AMS, respiratory failure   History of Present Illness:  Patient with medical history significant for Parkinson's disease, CVA, carotid artery stenosis, chronic headaches came in with altered mental status, getting agitated, erratic behavior  History of recent steroid injection for back pain and progressively more agitated over the last 4 days  Remained restless and agitated, required Precedex . While in the ED received Valium , Haldol , Seroquel  without significant improvement in restlessness This morning noted to be more altered, bradycardic and hypoxemic  Emergently intubated  Admitted to ICU  Patient extubated on 7/31 and transferred out of icu.   On 8/1 patient w/ agitation delirium requiring prn haldol . On floor being apneic and required to be intubated. Brought back to icu.  Pccm consulted  Pertinent  Medical History   Past Medical History:  Diagnosis Date   Carotid artery stenosis 04/29/2018   Chronic tension headaches 04/29/2018   Essential hypertension 04/29/2018   GERD (gastroesophageal reflux disease) 04/29/2018   HLD (hyperlipidemia) 04/29/2018   Parkinson's disease (HCC) 04/29/2018   Significant Hospital Events: Including procedures, antibiotic start and stop dates in addition to other pertinent events   7/29-presented with agitation and restlessness 7/30-emergently intubated-hypoxemia, bradycardia 7/31 extubated; transferred out of icu 8/1 respiratory arrest intubated on floor probably related to medications 8/2 Extubated  Interim History / Subjective:  Patient was successfully extubated yesterday, remained on room air Denies any complaint  Remained afebrile   Objective    Blood pressure (!) 153/60, pulse 73, temperature 98 F (36.7 C), temperature source Oral, resp. rate 13, height 6' (1.829 m),  weight 74.1 kg, SpO2 98%.    Vent Mode: PSV;CPAP FiO2 (%):  [30 %] 30 % PEEP:  [5 cmH20] 5 cmH20 Pressure Support:  [5 cmH20-8 cmH20] 5 cmH20   Intake/Output Summary (Last 24 hours) at 02/24/2024 9176 Last data filed at 02/24/2024 0400 Gross per 24 hour  Intake 131.97 ml  Output 1300 ml  Net -1168.03 ml   Filed Weights   02/22/24 0500 02/23/24 0500 02/24/24 0500  Weight: 78.7 kg 75.8 kg 74.1 kg    Examination: General: Chronically ill-appearing elderly male, lying on the bed HEENT: [place]/AT, eyes anicteric.  moist mucus membranes Neuro: Generalized weak, awake, following commands, moving all 4 extremities Chest: Coarse breath sounds, no wheezes or rhonchi Heart: Regular rate and rhythm, no murmurs or gallops Abdomen: Soft, nontender, nondistended, bowel sounds present  Labs and images reviewed  Patient Lines/Drains/Airways Status     Active Line/Drains/Airways     Name Placement date Placement time Site Days   Peripheral IV 02/20/24 20 G Distal;Posterior;Right Forearm 02/20/24  0406  Forearm  4   Peripheral IV 02/20/24 20 G Left Antecubital 02/20/24  0734  Antecubital  4   External Urinary Catheter 02/21/24  --  --  3        Resolved problem list   Assessment and Plan  Acute respiratory failure with hypoxia, likely medication induced Possible aspiration pneumonia Patient was agitated required Haldol  After that he was found unresponsive requiring endotracheal intubation Patient was successfully extubated yesterday, currently on room air Out of bed to chair Respiratory culture grew mixed flora Continue ceftriaxone  to complete 5 days therapy  Acute hyperactive delirium  Patient was agitated on the floor, required Haldol  Off sedation Continue trazodone  as needed nightly Continue Seroquel  at bedtime Continue  delirium precautions  Hyponatremia Closely monitor electrolytes  Parkinson's disease Continue Sinemet   Hypertension Continue amlodipine  Continue as needed  hydralazine   Anemia of chronic disease Monitor H&H and transfuse if less than 7  Best Practice (right click and Reselect all SmartList Selections daily)   Diet/type: Regular consistency DVT prophylaxis subcu Lovenox  Pressure ulcer(s): Please see nursing notes GI prophylaxis: N/A Lines: N/A Foley: N/A Code Status:  full code Last date of multidisciplinary goals of care discussion [update wife over phone on 8/3] dysuria to continue full scope of care  Labs   CBC: Recent Labs  Lab 02/19/24 1635 02/19/24 2321 02/20/24 0408 02/20/24 0846 02/20/24 0927 02/21/24 0240 02/23/24 0040 02/23/24 0924  WBC 13.9* 12.0* 12.3*  --   --  9.3  --  11.5*  NEUTROABS 9.9*  --   --   --   --   --   --  9.1*  HGB 12.2* 11.9* 12.5* 11.6* 11.9* 11.3* 9.9* 11.7*  HCT 35.0* 34.1* 37.3* 34.0* 35.0* 33.9* 29.0* 34.9*  MCV 83.7 84.6 87.6  --   --  86.7  --  88.4  PLT 242 221 206  --   --  177  --  200    Basic Metabolic Panel: Recent Labs  Lab 02/19/24 1635 02/19/24 2321 02/20/24 0408 02/20/24 0846 02/20/24 0927 02/20/24 1425 02/21/24 0240 02/23/24 0015 02/23/24 0040 02/23/24 0924  NA 134*  --  137   < > 139  --  137 134* 134* 134*  K 3.9  --  3.6   < > 4.3  --  3.5 4.4 3.6 4.4  CL 101  --  105  --   --   --  106 104  --  103  CO2 20*  --  20*  --   --   --  21* 17*  --  21*  GLUCOSE 103*  --  104*  --   --   --  112* 203*  --  125*  BUN 15  --  11  --   --   --  13 15  --  13  CREATININE 1.00 0.93 0.90  --   --   --  0.83 0.85  --  0.79  CALCIUM  8.9  --  8.8*  --   --   --  8.1* 8.0*  --  8.5*  MG  --   --   --   --   --  1.9 1.8  --   --  2.0  PHOS  --   --   --   --   --  3.4 3.3  --   --  3.0   < > = values in this interval not displayed.   GFR: Estimated Creatinine Clearance: 81 mL/min (by C-G formula based on SCr of 0.79 mg/dL). Recent Labs  Lab 02/19/24 2321 02/20/24 0408 02/21/24 0240 02/23/24 0015 02/23/24 0307 02/23/24 0924  PROCALCITON <0.10  --   --   --   --   0.25  WBC 12.0* 12.3* 9.3  --   --  11.5*  LATICACIDVEN  --   --   --  2.5* 1.4 1.5    Liver Function Tests: Recent Labs  Lab 02/19/24 1635 02/20/24 0408 02/23/24 0924  AST 19 17  --   ALT <5 <5  --   ALKPHOS 58 59  --   BILITOT 1.4* 1.9*  --   PROT 6.6 6.3*  --   ALBUMIN 4.0 3.8  3.0*   No results for input(s): LIPASE, AMYLASE in the last 168 hours. Recent Labs  Lab 02/19/24 2320  AMMONIA <13    ABG    Component Value Date/Time   PHART 7.403 02/23/2024 0040   PCO2ART 33.2 02/23/2024 0040   PO2ART 469 (H) 02/23/2024 0040   HCO3 20.7 02/23/2024 0040   TCO2 22 02/23/2024 0040   ACIDBASEDEF 3.0 (H) 02/23/2024 0040   O2SAT 100 02/23/2024 0040     Coagulation Profile: No results for input(s): INR, PROTIME in the last 168 hours.  Cardiac Enzymes: Recent Labs  Lab 02/19/24 1937  CKTOTAL 77    HbA1C: Hemoglobin A1C  Date/Time Value Ref Range Status  12/10/2019 11:39 AM 5.8 (A) 4.0 - 5.6 % Final   Hgb A1c MFr Bld  Date/Time Value Ref Range Status  04/24/2021 05:00 AM 5.4 4.8 - 5.6 % Final    Comment:    (NOTE) Pre diabetes:          5.7%-6.4%  Diabetes:              >6.4%  Glycemic control for   <7.0% adults with diabetes     CBG: Recent Labs  Lab 02/23/24 1135 02/23/24 1547 02/23/24 2000 02/23/24 2327 02/24/24 0430  GLUCAP 122* 74 100* 104* 87       Valinda Novas, MD [facility] See Amion for pager If no response to pager, please call [phone] until 7pm After 7pm, Please call E-link [phone]

## 2024-02-24 NOTE — Plan of Care (Signed)
   Problem: Nutrition: Goal: Adequate nutrition will be maintained Outcome: Progressing

## 2024-02-25 ENCOUNTER — Inpatient Hospital Stay (HOSPITAL_COMMUNITY)

## 2024-02-25 DIAGNOSIS — R443 Hallucinations, unspecified: Secondary | ICD-10-CM | POA: Diagnosis not present

## 2024-02-25 DIAGNOSIS — I10 Essential (primary) hypertension: Secondary | ICD-10-CM | POA: Diagnosis not present

## 2024-02-25 DIAGNOSIS — R4182 Altered mental status, unspecified: Secondary | ICD-10-CM | POA: Diagnosis not present

## 2024-02-25 DIAGNOSIS — G20B1 Parkinson's disease with dyskinesia, without mention of fluctuations: Secondary | ICD-10-CM | POA: Diagnosis not present

## 2024-02-25 DIAGNOSIS — R410 Disorientation, unspecified: Secondary | ICD-10-CM | POA: Diagnosis not present

## 2024-02-25 DIAGNOSIS — G20A1 Parkinson's disease without dyskinesia, without mention of fluctuations: Secondary | ICD-10-CM | POA: Diagnosis not present

## 2024-02-25 LAB — CBC
HCT: 34.7 % — ABNORMAL LOW (ref 39.0–52.0)
Hemoglobin: 11.5 g/dL — ABNORMAL LOW (ref 13.0–17.0)
MCH: 29 pg (ref 26.0–34.0)
MCHC: 33.1 g/dL (ref 30.0–36.0)
MCV: 87.4 fL (ref 80.0–100.0)
Platelets: 284 K/uL (ref 150–400)
RBC: 3.97 MIL/uL — ABNORMAL LOW (ref 4.22–5.81)
RDW: 14.4 % (ref 11.5–15.5)
WBC: 10.3 K/uL (ref 4.0–10.5)
nRBC: 0 % (ref 0.0–0.2)

## 2024-02-25 LAB — CULTURE, RESPIRATORY W GRAM STAIN: Culture: NORMAL

## 2024-02-25 LAB — COMPREHENSIVE METABOLIC PANEL WITH GFR
ALT: <5 U/L (ref 0–44)
AST: 23 U/L (ref 15–41)
Albumin: 2.9 g/dL — ABNORMAL LOW (ref 3.5–5.0)
Alkaline Phosphatase: 55 U/L (ref 38–126)
Anion gap: 13 (ref 5–15)
BUN: 11 mg/dL (ref 8–23)
CO2: 24 mmol/L (ref 22–32)
Calcium: 8.8 mg/dL — ABNORMAL LOW (ref 8.9–10.3)
Chloride: 101 mmol/L (ref 98–111)
Creatinine, Ser: 0.71 mg/dL (ref 0.61–1.24)
GFR, Estimated: >60 mL/min (ref >60)
Glucose, Bld: 111 mg/dL — ABNORMAL HIGH (ref 70–99)
Potassium: 4.3 mmol/L (ref 3.5–5.1)
Sodium: 138 mmol/L (ref 135–145)
Total Bilirubin: 0.8 mg/dL (ref 0.0–1.2)
Total Protein: 6.4 g/dL — ABNORMAL LOW (ref 6.5–8.1)

## 2024-02-25 MED ORDER — MELATONIN 5 MG PO TABS
5.0000 mg | ORAL_TABLET | Freq: Every day | ORAL | Status: DC
  Administered 2024-02-25 – 2024-02-26 (×2): 5 mg via ORAL
  Filled 2024-02-25 (×2): qty 1

## 2024-02-25 MED ORDER — ROTIGOTINE 2 MG/24HR TD PT24
1.0000 | MEDICATED_PATCH | Freq: Every day | TRANSDERMAL | Status: DC
  Filled 2024-02-25 (×3): qty 1

## 2024-02-25 MED ORDER — QUETIAPINE FUMARATE 25 MG PO TABS: 25.0000 mg | ORAL_TABLET | Freq: Two times a day (BID) | ORAL | Status: DC

## 2024-02-25 MED ORDER — QUETIAPINE FUMARATE 25 MG PO TABS
25.0000 mg | ORAL_TABLET | Freq: Two times a day (BID) | ORAL | Status: DC | PRN
  Administered 2024-02-25: 25 mg via ORAL
  Filled 2024-02-25: qty 1

## 2024-02-25 NOTE — Care Management Important Message (Signed)
 Important Message  Patient Details  Name: Terry [name] MRN: [id number] Date of Birth: [birth year]   Important Message Given:  Yes - Medicare IM     Claretta Deed 02/25/2024, 4:33 PM

## 2024-02-25 NOTE — Progress Notes (Signed)
 Physical Therapy Treatment Patient Details Name: Terry [name] MRN: [id number] DOB: [birth year] Today's Date: 02/25/2024   History of Present Illness 78 y.o. male presents to [facility] on 02/19/24 from [facility] for erratic behavior and AMS. Pt with acute hypoxic respiratory failure, emergently intubated from 7/29-7/31 and 8/1-8/2. Admitted with metabolic encephalopathy. PMHx:  Parkinson's disease, CVA, carotid artery stenosis, chronic headaches    PT Comments  Pt received in bed, sleeping. Easy to rouse but remained lethargic and fatigued quickly. He required mod assist supine to sit, +2 mod assist sit to stand, +2 min assist SPT with RW, mod assist amb 5' with RW +2 chair follow, and +2 max assist sit to supine. Pt in bed at end of session. Unsafe to leave up in recliner due to wax/wane aggressive behavior. Pt very pleasant and cooperative during today's session. Current POC remains appropriate.    If plan is discharge home, recommend the following: A lot of help with walking and/or transfers;A lot of help with bathing/dressing/bathroom;Assistance with cooking/housework;Assist for transportation;Help with stairs or ramp for entrance   Can travel by private vehicle     No  Equipment Recommendations  None recommended by PT    Recommendations for Other Services       Precautions / Restrictions Precautions Precautions: Fall Recall of Precautions/Restrictions: Impaired Precaution/Restrictions Comments: incontinent.     Mobility  Bed Mobility Overal bed mobility: Needs Assistance Bed Mobility: Supine to Sit, Sit to Supine     Supine to sit: Mod assist, HOB elevated, Used rails Sit to supine: +2 for physical assistance, Max assist   General bed mobility comments: increased assist to return to bed due to fatigue    Transfers Overall transfer level: Needs assistance Equipment used: Rolling walker (2 wheels) Transfers: Sit to/from Stand, Bed to chair/wheelchair/BSC Sit to Stand: Mod  assist, +2 physical assistance   Step pivot transfers: Min assist, +2 safety/equipment, +2 physical assistance       General transfer comment: STS from bed and recliner. Pivot transfer bed to recliner, then recliner to bed.    Ambulation/Gait Ambulation/Gait assistance: Mod assist, +2 safety/equipment Gait Distance (Feet): 5 Feet Assistive device: Rolling walker (2 wheels) Gait Pattern/deviations: Step-through pattern, Decreased stride length, Shuffle Gait velocity: decreased     General Gait Details: posterior lean. Fatigued quickly. Close chair follow   Stairs             Wheelchair Mobility     Tilt Bed    Modified Rankin (Stroke Patients Only)       Balance Overall balance assessment: Needs assistance Sitting-balance support: No upper extremity supported, Feet supported Sitting balance-Leahy Scale: Fair   Postural control: Posterior lean Standing balance support: Bilateral upper extremity supported, Reliant on assistive device for balance, During functional activity Standing balance-Leahy Scale: Poor                              Communication Communication Communication: Impaired Factors Affecting Communication: Difficulty expressing self;Reduced clarity of speech  Cognition Arousal: Lethargic Behavior During Therapy: WFL for tasks assessed/performed   PT - Cognitive impairments: Problem solving, Safety/Judgement, Sequencing                         Following commands: Intact      Cueing Cueing Techniques: Verbal cues, Gestural cues  Exercises      General Comments General comments (skin integrity, edema, etc.): VSS on RA  Pertinent Vitals/Pain Pain Assessment Pain Assessment: No/denies pain    Home Living                          Prior Function            PT Goals (current goals can now be found in the care plan section) Acute Rehab PT Goals Patient Stated Goal: not stated Progress towards PT  goals: Progressing toward goals    Frequency    Min 2X/week      PT Plan      Co-evaluation              AM-PAC PT 6 Clicks Mobility   Outcome Measure  Help needed turning from your back to your side while in a flat bed without using bedrails?: A Lot Help needed moving from lying on your back to sitting on the side of a flat bed without using bedrails?: A Lot Help needed moving to and from a bed to a chair (including a wheelchair)?: Total Help needed standing up from a chair using your arms (e.g., wheelchair or bedside chair)?: Total Help needed to walk in hospital room?: Total Help needed climbing 3-5 steps with a railing? : Total 6 Click Score: 8    End of Session Equipment Utilized During Treatment: Gait belt Activity Tolerance: Patient tolerated treatment well Patient left: in bed;with call bell/phone within reach;with bed alarm set Nurse Communication: Mobility status PT Visit Diagnosis: Unsteadiness on feet (R26.81);Other abnormalities of gait and mobility (R26.89);Muscle weakness (generalized) (M62.81);History of falling (Z91.81)     Time: 9083-9060 PT Time Calculation (min) (ACUTE ONLY): 23 min  Charges:    $Gait Training: 8-22 mins $Therapeutic Activity: 8-22 mins PT General Charges $$ ACUTE PT VISIT: 1 Visit                     Sari MATSU., PT  Office # [phone]    Erven Sari Shaker 02/25/2024, 9:47 AM

## 2024-02-25 NOTE — Progress Notes (Signed)
 PROGRESS NOTE        PATIENT DETAILS Name: Terry [name] Age: 78 y.o. Sex: male Date of Birth: 18-Mar-1946 Admit Date: 02/19/2024 Admitting Physician Jennet DELENA Epley, MD ERE:Mpryuzm, Darice CROME, MD  Brief Summary: Patient is a 78 y.o.  male with history of Parkinson's disease-presented with altered mental status-in the ED-received Valium /Haldol /Seroquel -with improvement but later on became altered/hypoxemic/bradycardic-intubated-admitted to the ICU.  Subsequently extubated-transferred to the floor-when he developed respiratory arrest following administration of Haldol -reintubated and transferred to the ICU.  Stabilized-extubated and transferred back to [facility] on 8/4.  See below for further details.  Significant events: 7/29>>presented with agitation and restlessness 7/30>>emergently intubated-hypoxemia, bradycardia 7/31>> extubated; transferred out of icu 8/1>>respiratory arrest intubated on floor probably related to medications 8/2>>Extubated 8/4>> transferred to [facility]  Significant studies: 7/29>> CT head: No acute intracranial abnormality 7/29>> CT angio chest/abdomen/pelvis: No evidence of aneurysm/dissection-no significant central pulmonary emboli.  No active pulmonary disease.  Significant microbiology data: 8/2>> tracheal aspirate: Pending.  Procedures: See above.  Consults: None  Subjective: Lying comfortably in bed-denies any chest pain or shortness of breath.  Objective: Vitals: Blood pressure (!) 163/80, pulse 66, temperature 98.4 F (36.9 C), temperature source Axillary, resp. rate 17, height 6' (1.829 m), weight 73.1 kg, SpO2 96%.   Exam: Gen Exam: Comfortable but confused HEENT:atraumatic, normocephalic Chest: B/L clear to auscultation anteriorly CVS:S1S2 regular Abdomen:soft non tender, non distended Extremities:no edema Neurology: Difficult exam but moving all 4 extremities Skin: no rash  Pertinent Labs/Radiology:    Latest Ref  Rng & Units 02/25/2024    8:53 AM 02/23/2024    9:24 AM 02/23/2024   12:40 AM  CBC  WBC 4.0 - 10.5 K/uL 10.3  11.5    Hemoglobin 13.0 - 17.0 g/dL 88.4  88.2  9.9   Hematocrit 39.0 - 52.0 % 34.7  34.9  29.0   Platelets 150 - 400 K/uL 284  200      Lab Results  Component Value Date   NA 138 02/25/2024   K 4.3 02/25/2024   CL 101 02/25/2024   CO2 24 02/25/2024      Assessment/Plan: Acute respiratory failure secondary to inability to protect airway/aspiration pneumonia Intubated x 2 this admission-extubated 8/2-currently stabilized on room air on 1-2 L of oxygen Continue to treat PNA with antibiotics. Minimize sedating medications Incentive spirometry/flutter valve Mobilize  Presumed aspiration pneumonia Continue Rocephin  Follow tracheal aspirate cultures when final  Delirium in setting of Parkinson's disease Per prior notes-mentation worsened after recent epidural steroid injection Management of delirium has been challenging-he has been intubated x 2 this hospitalization-with numerous medication combination. Stop Haldol  Will change Seroquel  to twice daily dosing Add melatonin Delirium precautions  Parkinson disease On Sinemet /Neupro  patch Reviewed most recent neurology note 7/30-also neurology signed off-changes to his medication regimen was being contemplated once he is extubated and more stable.  Will touch base with neurology.  Chronic tension headache Supportive care  HTN BP stable Continue amlodipine   HLD Statin  GERD PPI  Nutrition Status: Nutrition Problem: Inadequate oral intake Etiology: inability to eat Signs/Symptoms: NPO status Interventions: Ensure Enlive (each supplement provides 350kcal and 20 grams of protein), MVI  Code status:   Code Status: Full Code   DVT Prophylaxis: enoxaparin  (LOVENOX ) injection 40 mg Start: 02/23/24 1800 SCDs Start: 02/20/24 0855   Family Communication: None at bedside   Disposition Plan: Status  is:  Inpatient Remains inpatient appropriate because: Severity of illness   Planned Discharge Destination:Skilled nursing facility   Diet: Diet Order             Diet regular Room service appropriate? Yes with Assist; Fluid consistency: Thin  Diet effective now                     Antimicrobial agents: Anti-infectives (From admission, onward)    Start     Dose/Rate Route Frequency Ordered Stop   02/24/24 0915  cefTRIAXone  (ROCEPHIN ) 2 g in sodium chloride  0.9 % 100 mL IVPB        2 g 200 mL/hr over 30 Minutes Intravenous Every 24 hours 02/24/24 0828 02/29/24 0914        MEDICATIONS: Scheduled Meds:  amLODipine   5 mg Oral Daily   atorvastatin   20 mg Per Tube QHS   brimonidine   1 drop Both Eyes BID   And   timolol   1 drop Both Eyes BID   carbidopa -levodopa   2 tablet Oral TID   carbidopa -levodopa   2 tablet Oral QHS   carbidopa -levodopa   3 tablet Oral TID   Chlorhexidine  Gluconate Cloth  6 each Topical Daily   enoxaparin  (LOVENOX ) injection  40 mg Subcutaneous Q24H   feeding supplement  237 mL Oral BID BM   mouth rinse  15 mL Mouth Rinse TID PC & HS   polyethylene glycol  17 g Oral Daily   QUEtiapine   25 mg Oral QHS   rotigotine   1 patch Transdermal Daily   Continuous Infusions:  cefTRIAXone  (ROCEPHIN )  IV 2 g (02/25/24 0844)   PRN Meds:.acetaminophen  **OR** acetaminophen , albuterol , baclofen , docusate sodium , haloperidol  lactate, hydrALAZINE , ondansetron  **OR** ondansetron  (ZOFRAN ) IV, mouth rinse, polyethylene glycol, traZODone    I have personally reviewed following labs and imaging studies  LABORATORY DATA: CBC: Recent Labs  Lab 02/19/24 1635 02/19/24 2321 02/20/24 0408 02/20/24 0846 02/20/24 0927 02/21/24 0240 02/23/24 0040 02/23/24 0924 02/25/24 0853  WBC 13.9* 12.0* 12.3*  --   --  9.3  --  11.5* 10.3  NEUTROABS 9.9*  --   --   --   --   --   --  9.1*  --   HGB 12.2* 11.9* 12.5*   < > 11.9* 11.3* 9.9* 11.7* 11.5*  HCT 35.0* 34.1* 37.3*   < >  35.0* 33.9* 29.0* 34.9* 34.7*  MCV 83.7 84.6 87.6  --   --  86.7  --  88.4 87.4  PLT 242 221 206  --   --  177  --  200 284   < > = values in this interval not displayed.    Basic Metabolic Panel: Recent Labs  Lab 02/20/24 0408 02/20/24 0846 02/20/24 1425 02/21/24 0240 02/23/24 0015 02/23/24 0040 02/23/24 0924 02/25/24 0853  NA 137   < >  --  137 134* 134* 134* 138  K 3.6   < >  --  3.5 4.4 3.6 4.4 4.3  CL 105  --   --  106 104  --  103 101  CO2 20*  --   --  21* 17*  --  21* 24  GLUCOSE 104*  --   --  112* 203*  --  125* 111*  BUN 11  --   --  13 15  --  13 11  CREATININE 0.90  --   --  0.83 0.85  --  0.79 0.71  CALCIUM  8.8*  --   --  8.1* 8.0*  --  8.5* 8.8*  MG  --   --  1.9 1.8  --   --  2.0  --   PHOS  --   --  3.4 3.3  --   --  3.0  --    < > = values in this interval not displayed.    GFR: Estimated Creatinine Clearance: 80 mL/min (by C-G formula based on SCr of 0.71 mg/dL).  Liver Function Tests: Recent Labs  Lab 02/19/24 1635 02/20/24 0408 02/23/24 0924 02/25/24 0853  AST 19 17  --  23  ALT <5 <5  --  <5  ALKPHOS 58 59  --  55  BILITOT 1.4* 1.9*  --  0.8  PROT 6.6 6.3*  --  6.4*  ALBUMIN 4.0 3.8 3.0* 2.9*   No results for input(s): LIPASE, AMYLASE in the last 168 hours. Recent Labs  Lab 02/19/24 2320  AMMONIA <13    Coagulation Profile: No results for input(s): INR, PROTIME in the last 168 hours.  Cardiac Enzymes: Recent Labs  Lab 02/19/24 1937  CKTOTAL 77    BNP (last 3 results) No results for input(s): PROBNP in the last 8760 hours.  Lipid Profile: No results for input(s): CHOL, HDL, LDLCALC, TRIG, CHOLHDL, LDLDIRECT in the last 72 hours.  Thyroid  Function Tests: No results for input(s): TSH, T4TOTAL, FREET4, T3FREE, THYROIDAB in the last 72 hours.  Anemia Panel: No results for input(s): VITAMINB12, FOLATE, FERRITIN, TIBC, IRON, RETICCTPCT in the last 72 hours.  Urine analysis:     Component Value Date/Time   COLORURINE YELLOW 02/22/2024 2215   APPEARANCEUR HAZY (A) 02/22/2024 2215   LABSPEC 1.013 02/22/2024 2215   PHURINE 5.0 02/22/2024 2215   GLUCOSEU NEGATIVE 02/22/2024 2215   HGBUR NEGATIVE 02/22/2024 2215   BILIRUBINUR NEGATIVE 02/22/2024 2215   KETONESUR 5 (A) 02/22/2024 2215   PROTEINUR NEGATIVE 02/22/2024 2215   NITRITE NEGATIVE 02/22/2024 2215   LEUKOCYTESUR NEGATIVE 02/22/2024 2215    Sepsis Labs: Lactic Acid, Venous    Component Value Date/Time   LATICACIDVEN 1.5 02/23/2024 9075    MICROBIOLOGY: Recent Results (from the past 240 hours)  MRSA Next Gen by PCR, Nasal     Status: Abnormal   Collection Time: 02/20/24 10:15 AM   Specimen: Nasal Mucosa; Nasal Swab  Result Value Ref Range Status   MRSA by PCR Next Gen DETECTED (A) NOT DETECTED Final    Comment: RESULT CALLED TO, READ BACK BY AND VERIFIED WITH: RN IRIS NGODA ON 02/20/24 @ 1228 BY DRT (NOTE) The GeneXpert MRSA Assay (FDA approved for NASAL specimens only), is one component of a comprehensive MRSA colonization surveillance program. It is not intended to diagnose MRSA infection nor to guide or monitor treatment for MRSA infections. Test performance is not FDA approved in patients less than 42 years old. Performed at [facility], [address] [place], [place] [address]   Culture, Respiratory w Gram Stain     Status: None (Preliminary result)   Collection Time: 02/23/24 10:33 AM   Specimen: Tracheal Aspirate; Respiratory  Result Value Ref Range Status   Specimen Description TRACHEAL ASPIRATE  Final   Special Requests NONE  Final   Gram Stain   Final    MODERATE WBC PRESENT, PREDOMINANTLY PMN FEW SQUAMOUS EPITHELIAL CELLS PRESENT MODERATE GRAM POSITIVE COCCI RARE GRAM NEGATIVE RODS    Culture   Final    CULTURE REINCUBATED FOR BETTER GROWTH Performed at [facility], [address], [place], [place] [address]    Report Status PENDING  Incomplete     RADIOLOGY STUDIES/RESULTS: No results found.   LOS: 6 days   Donalda Applebaum, MD  [facility]    To contact the attending provider between 7A-7P or the covering provider during after hours 7P-7A, please log into the web site [email or web address] and access using universal [place] password for that web site. If you do not have the password, please call the hospital operator.  02/25/2024, 10:09 AM

## 2024-02-25 NOTE — Progress Notes (Signed)
 Modified Barium Swallow Study  Patient Details  Name: Terry [name] MRN: [id number] Date of Birth: 04-23-1946  Today's Date: 02/25/2024  Modified Barium Swallow completed.  Full report located under Chart Review in the Imaging Section.  History of Present Illness Terry [name] is a 78 yo male with PMH including Parkinson's disease, CVA, GERD, carotid artery stenosis, chronic headaches who presents 7/29 with AMS and erratic behavior. Emergently intubated 7/30-7/31 and 8/1-8/2, thought to be secondary to medication-induced hypoxia. Pt passed the swallow screen after both extubations. Seen by SLP 01/07/24 with recommendations to continue regular diet with thin liquids.   Clinical Impression Pt presents with an overall functional oropharyngeal swallow. He has deficits related to coordination and strength which are suspected to be related to Parkinson's disease, for which he compensates well without significant residue or penetration/aspiration. Coughing was noted at the end of the study prior to transferring pt back to bed but no airway invasion or retrograde esophageal backflow was noted with fluoro. Provided education with recommendations to continue current diet. Will sign off at this time but discussed f/u for hypophonia at pt's next venue of care. Factors that may increase risk of adverse event in presence of aspiration [employer] 2021): Poor general health and/or compromised immunity;Reduced cognitive function;Weak cough  Swallow Evaluation Recommendations Recommendations: PO diet PO Diet Recommendation: Regular;Thin liquids (Level 0) Liquid Administration via: Cup;Straw Medication Administration: Whole meds with liquid Supervision: Patient able to self-feed Swallowing strategies  : Minimize environmental distractions;Slow rate;Small bites/sips Postural changes: Position pt fully upright for meals;Stay upright 30-60 min after meals Oral care recommendations: Oral care BID  (2x/day)    Damien Blumenthal, M.A., CCC-SLP Speech Language Pathology, Acute Rehabilitation Services  Secure Chat preferred [phone]  02/25/2024,2:00 PM

## 2024-02-25 NOTE — Progress Notes (Signed)
 Patient refused

## 2024-02-25 NOTE — Plan of Care (Signed)
 Pt has rested quietly throughout the night with no distress noted off and on. Alert and oriented to person, place and situation at beginning of shift. Around 2130-2200 pt became confused. Thought he was at home in the wrong bed. Became argumentative when trying to reorient. Pt kept asking for things that were not there. Continued to be confused throughout the night. Bed alarm on. On room air. SR on the monitor. Purewick on to suction. Pt had 1 BM and was incontiinent of urine pulling off purewick. New one applied. Cleaned, skin care and linens changed. No complaints voiced.     Problem: Clinical Measurements: Goal: Respiratory complications will improve Outcome: Progressing Goal: Cardiovascular complication will be avoided Outcome: Progressing   Problem: Activity: Goal: Risk for activity intolerance will decrease Outcome: Progressing   Problem: Coping: Goal: Level of anxiety will decrease Outcome: Progressing   Problem: Pain Managment: Goal: General experience of comfort will improve and/or be controlled Outcome: Progressing   Problem: Safety: Goal: Ability to remain free from injury will improve Outcome: Progressing

## 2024-02-25 NOTE — TOC Progression Note (Signed)
 Transition of Care [facility]) - Progression Note    Patient Details  Name: Terry [name] MRN: [id number] Date of Birth: 03-Dec-1945  Transition of [facility]) CM/SW Contact  Inocente GORMAN Kindle, LCSW Phone Number: 02/25/2024, 1:55 PM  Clinical Narrative:    CSW continuing to follow for medical progression. Will require insurance approval for SNF.    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                                       Social Drivers of Health (SDOH) Interventions SDOH Screenings   Food Insecurity: Low Risk  (01/07/2024)   Received from [facility]  Housing: Low Risk  (01/07/2024)   Received from [facility]  Transportation Needs: No Transportation Needs (01/07/2024)   Received from [facility]  Utilities: Low Risk  (01/07/2024)   Received from [facility]  Depression (PHQ2-9): Medium Risk (12/10/2019)  Social Connections: Unknown (10/11/2019)   Received from [facility]  Tobacco Use: Medium Risk (12/10/2023)    Readmission Risk Interventions     No data to display

## 2024-02-25 NOTE — Progress Notes (Signed)
 NEUROLOGY CONSULT FOLLOW UP NOTE   Date of service: February 25, 2024 Patient Name: Terry [name] MRN:  [id number] DOB:  April 07, 1946  Interval Hx/subjective   Patient is seen in his room with his wife at the bedside.  He is noted to be alert and oriented to person, place, time and situation, although attention is poor and speech is somewhat tangential.  Patient reports that bothersome hallucinations started since he was placed on Seroquel  and that he has had problems with this medication in the past.  On attending examination, he seems to be oriented to self but says that he is in a basement and he likes being people here in the basement-definitely disoriented.  Vitals   Vitals:   02/25/24 0400 02/25/24 0504 02/25/24 0817 02/25/24 1200  BP: (!) 142/97  (!) 163/80 112/66  Pulse:   66   Resp: 18  17 19   Temp: 98.3 F (36.8 C)  98.4 F (36.9 C)   TempSrc: Oral  Axillary   SpO2: 96%     Weight:  73.1 kg    Height:         Body mass index is 21.86 kg/m.  Physical Exam   Constitutional: Chronically ill-appearing elderly male in no acute distress Psych: Affect normal for situation Eyes: No scleral injection. Head: Normocephalic.  Cardiovascular: Regular rate and rhythm Respiratory: Respirations unlabored on room air Skin: WDI.  Neurologic Examination    NEURO:  Mental Status: AA&Ox3, able to give some history of present illness, although speech is tangential and attention is poor.  CAM-ICU is positive for inattention only.  Able to name 6 animals with 4 feet when asked, 3 out of 3 registration, 1 out of 3 recall but able to get the other 2 words with hints Speech/Language: speech is without dysarthria or aphasia.  Naming, repetition, fluency, and comprehension intact.  Cranial Nerves:  II: PERRL. III, IV, VI: EOMI. Eyelids elevate symmetrically.  V: Sensation is intact to light touch and symmetrical to face.  VII: Smile is symmetrical. VIII: hearing intact to voice. IX, X:  Phonation is normal.  KP:Dynloizm shrug 5/5. XII: tongue is midline without fasciculations. Motor: Able to move all 4 extremities with symmetrical antigravity strength Tone: Mild cogwheeling on the right. Sensation- Intact to light touch bilaterally.  Coordination: FTN intact bilaterally Gait- deferred     Medications  Current Facility-Administered Medications:    acetaminophen  (TYLENOL ) tablet 650 mg, 650 mg, Oral, Q6H PRN, 650 mg at 02/24/24 1000 **OR** acetaminophen  (TYLENOL ) suppository 650 mg, 650 mg, Rectal, Q6H PRN, Laron Agent, RPH   albuterol  (PROVENTIL ) (2.5 MG/3ML) 0.083% nebulizer solution 2.5 mg, 2.5 mg, Nebulization, Q2H PRN, Debby Camila LABOR, MD   amLODipine  (NORVASC ) tablet 5 mg, 5 mg, Oral, Daily, Ogbata, Sylvester I, MD, 5 mg at 02/25/24 9164   atorvastatin  (LIPITOR) tablet 20 mg, 20 mg, Per Tube, QHS, Payne, John D, PA-C, 20 mg at 02/24/24 2128   baclofen  (LIORESAL ) tablet 10 mg, 10 mg, Oral, TID PRN, Chand, Sudham, MD, 10 mg at 02/25/24 1219   brimonidine  (ALPHAGAN ) 0.2 % ophthalmic solution 1 drop, 1 drop, Both Eyes, BID, 1 drop at 02/25/24 0836 **AND** timolol  (TIMOPTIC ) 0.5 % ophthalmic solution 1 drop, 1 drop, Both Eyes, BID, Laron Agent, RPH, 1 drop at 02/25/24 9163   carbidopa -levodopa  (SINEMET  IR) 25-100 MG per tablet immediate release 2 tablet, 2 tablet, Oral, TID, Laron Agent, RPH, 2 tablet at 02/25/24 9164   carbidopa -levodopa  (SINEMET  IR) 25-100 MG per tablet immediate release 2  tablet, 2 tablet, Oral, QHS, Laron Agent, [facility], 2 tablet at 02/24/24 2128   carbidopa -levodopa  (SINEMET  IR) 25-100 MG per tablet immediate release 3 tablet, 3 tablet, Oral, TID, Laron Agent, RPH, 3 tablet at 02/25/24 1133   cefTRIAXone  (ROCEPHIN ) 2 g in sodium chloride  0.9 % 100 mL IVPB, 2 g, Intravenous, Q24H, Harold Scholz, MD, Last Rate: 200 mL/hr at 02/25/24 0844, 2 g at 02/25/24 0844   Chlorhexidine  Gluconate Cloth 2 % PADS 6 each, 6 each, Topical, Daily, Olalere,  Adewale A, MD, 6 each at 02/25/24 0836   docusate sodium  (COLACE) capsule 100 mg, 100 mg, Oral, BID PRN, Laron Agent, RPH   enoxaparin  (LOVENOX ) injection 40 mg, 40 mg, Subcutaneous, Q24H, Chand, Scholz, MD, 40 mg at 02/24/24 1743   feeding supplement (ENSURE PLUS HIGH PROTEIN) liquid 237 mL, 237 mL, Oral, BID BM, Hunsucker, Donnice SAUNDERS, MD, 237 mL at 02/25/24 0836   hydrALAZINE  (APRESOLINE ) injection 10-40 mg, 10-40 mg, Intravenous, Q4H PRN, Payne, John D, PA-C   melatonin tablet 5 mg, 5 mg, Oral, QHS, Ghimire, Shanker M, MD   ondansetron  (ZOFRAN ) tablet 4 mg, 4 mg, Oral, Q6H PRN **OR** ondansetron  (ZOFRAN ) injection 4 mg, 4 mg, Intravenous, Q6H PRN, Debby Camila LABOR, MD   Oral care mouth rinse, 15 mL, Mouth Rinse, PRN, Olalere, Adewale A, MD   Oral care mouth rinse, 15 mL, Mouth Rinse, TID PC & HS, Hunsucker, Donnice SAUNDERS, MD, 15 mL at 02/25/24 0836   polyethylene glycol (MIRALAX  / GLYCOLAX ) packet 17 g, 17 g, Oral, Daily, Laron Agent, RPH, 17 g at 02/24/24 0945   polyethylene glycol (MIRALAX  / GLYCOLAX ) packet 17 g, 17 g, Oral, Daily PRN, Laron Agent, RPH   QUEtiapine  (SEROQUEL ) tablet 25 mg, 25 mg, Oral, BID, Ghimire, Donalda HERO, MD   rotigotine  (NEUPRO ) 4 MG/24HR 1 patch, 1 patch, Transdermal, Daily, Olalere, Adewale A, MD, 1 patch at 02/25/24 1038   traZODone  (DESYREL ) tablet 50 mg, 50 mg, Oral, QHS PRN, Harold Scholz, MD, 50 mg at 02/24/24 2128  Labs and Diagnostic Imaging   CBC:  Recent Labs  Lab 02/19/24 1635 02/19/24 2321 02/23/24 0924 02/25/24 0853  WBC 13.9*   < > 11.5* 10.3  NEUTROABS 9.9*  --  9.1*  --   HGB 12.2*   < > 11.7* 11.5*  HCT 35.0*   < > 34.9* 34.7*  MCV 83.7   < > 88.4 87.4  PLT 242   < > 200 284   < > = values in this interval not displayed.    Basic Metabolic Panel:  Lab Results  Component Value Date   NA 138 02/25/2024   K 4.3 02/25/2024   CO2 24 02/25/2024   GLUCOSE 111 (H) 02/25/2024   BUN 11 02/25/2024   CREATININE 0.71 02/25/2024   CALCIUM   8.8 (L) 02/25/2024   GFRNONAA >60 02/25/2024   GFRAA 85 11/18/2019   Lipid Panel:  Lab Results  Component Value Date   LDLCALC 31 04/28/2021   HgbA1c:  Lab Results  Component Value Date   HGBA1C 5.4 04/24/2021   Urine Drug Screen:     Component Value Date/Time   LABOPIA NONE DETECTED 02/19/2024 1839   COCAINSCRNUR NONE DETECTED 02/19/2024 1839   LABBENZ NONE DETECTED 02/19/2024 1839   AMPHETMU NONE DETECTED 02/19/2024 1839   THCU NONE DETECTED 02/19/2024 1839   LABBARB NONE DETECTED 02/19/2024 1839    Alcohol Level No results found for: ETH INR No results found for: INR APTT No results found for:  APTT AED levels: No results found for: PHENYTOIN, ZONISAMIDE, LAMOTRIGINE, LEVETIRACETA  CT Head without contrast(Personally reviewed): No acute intracranial abnormality  CT angio chest,abd,pelvis No PE on chest imaging. No evidence of aneurysm, dissection, or significant stenosis in the thoracic or abdominal aorta. Aortic atherosclerosis.    Assessment   Terry [name] is a 78 y.o. 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  Since admission, patient was intubated twice but is now extubated and has been transferred out of the ICU.  He reports that at baseline, he often has mild hallucinations, typically of kittens and that these are not disturbing or bothersome.  He has reportedly been having more disturbing hallucinations since hospitalization but is unable to describe them.  Patient attributes the presence of hallucinations now to Seroquel  and states he has had difficulty with this medication in the past and he does not care for how it makes him feel.  Exam does show signs of mild delirium, and would initiate delirium precautions and good sleep hygiene to expedite resolution of delirium.  Will change Seroquel  to as  needed and avoid use if possible.  Also, Neupro  patch should be lowered in dose but not on formulary.  Discontinue for now and order 2 mg.  Acute hypoxemic respiratory failure: Now extubated Delirium vs psychosis in patient with parkinson's disease Parkinson's disease with dyskinesia and and history of psychosis -Etiology: recent steroid injection vs changing doses of levodopa -carbidopa  in the past few months. per Dr. Dala note (outpatient neurologist), patient has a prior history of psychosis. Hallucinations at  baseline, noted to be mild in May. He was previously on Pimavanserin. Daytime carbidopa /levodopa  stopped on 11/26/2023. He was started on Crexont , an extended release carbidopa -levodopa ) 250 mg: 2 pills at 6AM, 2 pills at 11 AM, and 2 pills at 4 PM.  He was also continued on Sinemet  at night time. No recent episodes of psychosis or agitation since most recent change in medications.  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 High risk for aspiration. Patient received rimabotulimum toxin B.  Passed swallow evaluation today. 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 Make Seroquel  as needed and not standing doses. Continue home Sinemet  dose as ordered in the chart Continue therapy assessments Will follow  _____________________________________________________________________   Earle FORBES Everitt Clint Abbey , MSN, AGACNP-BC [facility] See Amion for schedule and pager information 02/25/2024 2:25 PM   Attending Neurohospitalist Addendum Patient seen and examined with APP/Resident. Agree with the  history and physical as documented above. Agree with the plan as documented, which I helped formulate. I have independently reviewed the chart, obtained history, review of systems and examined the patient.I have personally reviewed pertinent head/neck/spine imaging (CT/MRI). Please feel free to call with any questions.  -- Eligio Lav, MD Neurologist [facility] Pager: [phone]

## 2024-02-26 ENCOUNTER — Other Ambulatory Visit (HOSPITAL_COMMUNITY): Payer: Self-pay

## 2024-02-26 ENCOUNTER — Telehealth (HOSPITAL_COMMUNITY): Payer: Self-pay | Admitting: Pharmacy Technician

## 2024-02-26 DIAGNOSIS — R4182 Altered mental status, unspecified: Secondary | ICD-10-CM | POA: Diagnosis not present

## 2024-02-26 DIAGNOSIS — I10 Essential (primary) hypertension: Secondary | ICD-10-CM | POA: Diagnosis not present

## 2024-02-26 DIAGNOSIS — G20A1 Parkinson's disease without dyskinesia, without mention of fluctuations: Secondary | ICD-10-CM | POA: Diagnosis not present

## 2024-02-26 NOTE — Progress Notes (Signed)
 PROGRESS NOTE        PATIENT DETAILS Name: Terry [name] Age: 78 y.o. Sex: male Date of Birth: 03-22-1946 Admit Date: 02/19/2024 Admitting Physician Jennet DELENA Epley, MD ERE:Mpryuzm, Darice CROME, MD  Brief Summary: Patient is a 78 y.o.  male with history of Parkinson's disease-presented with altered mental status-in the ED-received Valium /Haldol /Seroquel -with improvement but later on became altered/hypoxemic/bradycardic-intubated-admitted to the ICU.  Subsequently extubated-transferred to the floor-when he developed respiratory arrest following administration of Haldol -reintubated and transferred to the ICU.  Stabilized-extubated and transferred back to [facility] on 8/4.  See below for further details.  Significant events: 7/29>>presented with agitation and restlessness 7/30>>emergently intubated-hypoxemia, bradycardia 7/31>> extubated; transferred out of icu 8/1>>respiratory arrest intubated on floor probably related to medications 8/2>>Extubated 8/4>> transferred to [facility]  Significant studies: 7/29>> CT head: No acute intracranial abnormality 7/29>> CT angio chest/abdomen/pelvis: No evidence of aneurysm/dissection-no significant central pulmonary emboli.  No active pulmonary disease.  Significant microbiology data: 8/2>> tracheal aspirate: Normal respiratory flora  Procedures: See above.  Consults: None  Subjective: More awake and alert compared to yesterday-no major issues overnight.  Objective: Vitals: Blood pressure (!) 155/67, pulse (!) 58, temperature 98.2 F (36.8 C), temperature source Oral, resp. rate 15, height 6' (1.829 m), weight 71.7 kg, SpO2 92%.   Exam: Awake/alert-not as confused as yesterday Chest: Clear to auscultation CVS: S1-S2 regular Abdomen: Soft nontender nondistended Extremities: No edema Moving all 4 extremities  Pertinent Labs/Radiology:    Latest Ref Rng & Units 02/25/2024    8:53 AM 02/23/2024    9:24 AM 02/23/2024   12:40  AM  CBC  WBC 4.0 - 10.5 K/uL 10.3  11.5    Hemoglobin 13.0 - 17.0 g/dL 88.4  88.2  9.9   Hematocrit 39.0 - 52.0 % 34.7  34.9  29.0   Platelets 150 - 400 K/uL 284  200      Lab Results  Component Value Date   NA 138 02/25/2024   K 4.3 02/25/2024   CL 101 02/25/2024   CO2 24 02/25/2024      Assessment/Plan: Acute respiratory failure secondary to inability to protect airway/aspiration pneumonia Intubated x 2 this admission-extubated 8/2-currently stabilized on room air on 1-2 L of oxygen Continue to treat PNA with antibiotics. Minimize sedating medications Incentive spirometry/flutter valve Mobilize  Presumed aspiration pneumonia Continue Rocephin -plan on empiric treatment x 5 days total. Tracheal cultures unrevealing.  Delirium in setting of Parkinson's disease Per prior notes-mentation worsened after recent epidural steroid injection-also has had recent Parkinson medication changes as an outpatient. Management of delirium has been challenging-he has been intubated x 2 this hospitalization-with numerous medication combination. Avoid Haldol  Continue melatonin Seroquel  as needed-did not want to take it as scheduled Continue to maintain delirium precautions.    Parkinson disease On Sinemet /Neupro  patch Neurology reconsulted on 8/4-Neupro  patch reduced to 2 mg-recommendations are to continue current dosing of Sinemet -and to resume usual home dosing on discharge. Will need outpatient follow-up with neurology.   Chronic tension headache Supportive care  HTN BP stable Continue amlodipine   HLD Statin  GERD PPI  Nutrition Status: Nutrition Problem: Inadequate oral intake Etiology: inability to eat Signs/Symptoms: NPO status Interventions: Ensure Enlive (each supplement provides 350kcal and 20 grams of protein), MVI  Code status:   Code Status: Full Code   DVT Prophylaxis: enoxaparin  (LOVENOX ) injection 40 mg Start: 02/23/24 1800 SCDs Start:  02/20/24 0855    Family Communication: Spouse [name] Bingaman-[phone] updated 8/5   Disposition Plan: Status is: Inpatient Remains inpatient appropriate because: Severity of illness   Planned Discharge Destination:Skilled nursing facility   Diet: Diet Order             Diet regular Room service appropriate? Yes with Assist; Fluid consistency: Thin  Diet effective now                     Antimicrobial agents: Anti-infectives (From admission, onward)    Start     Dose/Rate Route Frequency Ordered Stop   02/24/24 0915  cefTRIAXone  (ROCEPHIN ) 2 g in sodium chloride  0.9 % 100 mL IVPB        2 g 200 mL/hr over 30 Minutes Intravenous Every 24 hours 02/24/24 0828 02/29/24 0914        MEDICATIONS: Scheduled Meds:  amLODipine   5 mg Oral Daily   atorvastatin   20 mg Per Tube QHS   brimonidine   1 drop Both Eyes BID   And   timolol   1 drop Both Eyes BID   carbidopa -levodopa   2 tablet Oral TID   carbidopa -levodopa   2 tablet Oral QHS   carbidopa -levodopa   3 tablet Oral TID   Chlorhexidine  Gluconate Cloth  6 each Topical Daily   enoxaparin  (LOVENOX ) injection  40 mg Subcutaneous Q24H   feeding supplement  237 mL Oral BID BM   melatonin  5 mg Oral QHS   mouth rinse  15 mL Mouth Rinse TID PC & HS   polyethylene glycol  17 g Oral Daily   rotigotine   1 patch Transdermal Daily   Continuous Infusions:  cefTRIAXone  (ROCEPHIN )  IV 2 g (02/26/24 0955)   PRN Meds:.acetaminophen  **OR** acetaminophen , albuterol , baclofen , docusate sodium , hydrALAZINE , ondansetron  **OR** ondansetron  (ZOFRAN ) IV, mouth rinse, polyethylene glycol, QUEtiapine , traZODone    I have personally reviewed following labs and imaging studies  LABORATORY DATA: CBC: Recent Labs  Lab 02/19/24 1635 02/19/24 2321 02/20/24 0408 02/20/24 0846 02/20/24 0927 02/21/24 0240 02/23/24 0040 02/23/24 0924 02/25/24 0853  WBC 13.9* 12.0* 12.3*  --   --  9.3  --  11.5* 10.3  NEUTROABS 9.9*  --   --   --   --   --   --  9.1*  --    HGB 12.2* 11.9* 12.5*   < > 11.9* 11.3* 9.9* 11.7* 11.5*  HCT 35.0* 34.1* 37.3*   < > 35.0* 33.9* 29.0* 34.9* 34.7*  MCV 83.7 84.6 87.6  --   --  86.7  --  88.4 87.4  PLT 242 221 206  --   --  177  --  200 284   < > = values in this interval not displayed.    Basic Metabolic Panel: Recent Labs  Lab 02/20/24 0408 02/20/24 0846 02/20/24 1425 02/21/24 0240 02/23/24 0015 02/23/24 0040 02/23/24 0924 02/25/24 0853  NA 137   < >  --  137 134* 134* 134* 138  K 3.6   < >  --  3.5 4.4 3.6 4.4 4.3  CL 105  --   --  106 104  --  103 101  CO2 20*  --   --  21* 17*  --  21* 24  GLUCOSE 104*  --   --  112* 203*  --  125* 111*  BUN 11  --   --  13 15  --  13 11  CREATININE 0.90  --   --  0.83  0.85  --  0.79 0.71  CALCIUM  8.8*  --   --  8.1* 8.0*  --  8.5* 8.8*  MG  --   --  1.9 1.8  --   --  2.0  --   PHOS  --   --  3.4 3.3  --   --  3.0  --    < > = values in this interval not displayed.    GFR: Estimated Creatinine Clearance: 78.4 mL/min (by C-G formula based on SCr of 0.71 mg/dL).  Liver Function Tests: Recent Labs  Lab 02/19/24 1635 02/20/24 0408 02/23/24 0924 02/25/24 0853  AST 19 17  --  23  ALT <5 <5  --  <5  ALKPHOS 58 59  --  55  BILITOT 1.4* 1.9*  --  0.8  PROT 6.6 6.3*  --  6.4*  ALBUMIN 4.0 3.8 3.0* 2.9*   No results for input(s): LIPASE, AMYLASE in the last 168 hours. Recent Labs  Lab 02/19/24 2320  AMMONIA <13    Coagulation Profile: No results for input(s): INR, PROTIME in the last 168 hours.  Cardiac Enzymes: Recent Labs  Lab 02/19/24 1937  CKTOTAL 77    BNP (last 3 results) No results for input(s): PROBNP in the last 8760 hours.  Lipid Profile: No results for input(s): CHOL, HDL, LDLCALC, TRIG, CHOLHDL, LDLDIRECT in the last 72 hours.  Thyroid  Function Tests: No results for input(s): TSH, T4TOTAL, FREET4, T3FREE, THYROIDAB in the last 72 hours.  Anemia Panel: No results for input(s): VITAMINB12, FOLATE,  FERRITIN, TIBC, IRON, RETICCTPCT in the last 72 hours.  Urine analysis:    Component Value Date/Time   COLORURINE YELLOW 02/22/2024 2215   APPEARANCEUR HAZY (A) 02/22/2024 2215   LABSPEC 1.013 02/22/2024 2215   PHURINE 5.0 02/22/2024 2215   GLUCOSEU NEGATIVE 02/22/2024 2215   HGBUR NEGATIVE 02/22/2024 2215   BILIRUBINUR NEGATIVE 02/22/2024 2215   KETONESUR 5 (A) 02/22/2024 2215   PROTEINUR NEGATIVE 02/22/2024 2215   NITRITE NEGATIVE 02/22/2024 2215   LEUKOCYTESUR NEGATIVE 02/22/2024 2215    Sepsis Labs: Lactic Acid, Venous    Component Value Date/Time   LATICACIDVEN 1.5 02/23/2024 9075    MICROBIOLOGY: Recent Results (from the past 240 hours)  MRSA Next Gen by PCR, Nasal     Status: Abnormal   Collection Time: 02/20/24 10:15 AM   Specimen: Nasal Mucosa; Nasal Swab  Result Value Ref Range Status   MRSA by PCR Next Gen DETECTED (A) NOT DETECTED Final    Comment: RESULT CALLED TO, READ BACK BY AND VERIFIED WITH: RN IRIS NGODA ON 02/20/24 @ 1228 BY DRT (NOTE) The GeneXpert MRSA Assay (FDA approved for NASAL specimens only), is one component of a comprehensive MRSA colonization surveillance program. It is not intended to diagnose MRSA infection nor to guide or monitor treatment for MRSA infections. Test performance is not FDA approved in patients less than 28 years old. Performed at [facility], [address] [place], [place] [address]   Culture, Respiratory w Gram Stain     Status: None   Collection Time: 02/23/24 10:33 AM   Specimen: Tracheal Aspirate; Respiratory  Result Value Ref Range Status   Specimen Description TRACHEAL ASPIRATE  Final   Special Requests NONE  Final   Gram Stain   Final    MODERATE WBC PRESENT, PREDOMINANTLY PMN FEW SQUAMOUS EPITHELIAL CELLS PRESENT MODERATE GRAM POSITIVE COCCI RARE GRAM NEGATIVE RODS    Culture   Final    FEW  Normal respiratory flora-no Staph aureus or Pseudomonas seen Performed at [facility]  Lab, [address] [place], [place] [address]    Report Status 02/25/2024 FINAL  Final    RADIOLOGY STUDIES/RESULTS: DG Swallowing Func-Speech Pathology Result Date: 02/25/2024 Table formatting from the original result was not included. Modified Barium Swallow Study Patient Details Name: Terry [name] MRN: [id number] Date of Birth: 08/14/1945 Today's Date: 02/25/2024 HPI/PMH: HPI: Erskine Steinfeldt is a 78 yo male with PMH including Parkinson's disease, CVA, GERD, carotid artery stenosis, chronic headaches who presents 7/29 with AMS and erratic behavior. Emergently intubated 7/30-7/31 and 8/1-8/2, thought to be secondary to medication-induced hypoxia. Pt passed the swallow screen after both extubations. Seen by SLP 01/07/24 with recommendations to continue regular diet with thin liquids. Clinical Impression: Clinical Impression: Pt presents with an overall functional oropharyngeal swallow. He has deficits related to coordination and strength which are suspected to be related to Parkinson's disease, for which he compensates well without significant residue or penetration/aspiration. Coughing was noted at the end of the study prior to transferring pt back to bed but no airway invasion or retrograde esophageal backflow was noted with fluoro. Provided education with recommendations to continue current diet. Will sign off at this time but discussed f/u for hypophonia at pt's next venue of care. Factors that may increase risk of adverse event in presence of aspiration Noe & Lianne 2021): Factors that may increase risk of adverse event in presence of aspiration Noe & Lianne 2021): Poor general health and/or compromised immunity; Reduced cognitive function; Weak cough Recommendations/Plan: Swallowing Evaluation Recommendations Swallowing Evaluation Recommendations Recommendations: PO diet PO Diet Recommendation: Regular; Thin liquids (Level 0) Liquid Administration via: Cup; Straw Medication Administration: Whole meds  with liquid Supervision: Patient able to self-feed Swallowing strategies  : Minimize environmental distractions; Slow rate; Small bites/sips Postural changes: Position pt fully upright for meals; Stay upright 30-60 min after meals Oral care recommendations: Oral care BID (2x/day) Treatment Plan Treatment Plan Treatment recommendations: No treatment recommended at this time Follow-up recommendations: Skilled nursing-short term rehab (<3 hours/day) Functional status assessment: Patient has not had a recent decline in their functional status. Recommendations Recommendations for follow up therapy are one component of a multi-disciplinary discharge planning process, led by the attending physician.  Recommendations may be updated based on patient status, additional functional criteria and insurance authorization. Assessment: Orofacial Exam: Orofacial Exam Oral Cavity: Oral Hygiene: WFL Oral Cavity - Dentition: Adequate natural dentition Orofacial Anatomy: WFL Oral Motor/Sensory Function: WFL Anatomy: Anatomy: Suspected cervical osteophytes; Prominent cricopharyngeus Boluses Administered: Boluses Administered Boluses Administered: Thin liquids (Level 0); Mildly thick liquids (Level 2, nectar thick); Moderately thick liquids (Level 3, honey thick); Puree; Solid  Oral Impairment Domain: Oral Impairment Domain Lip Closure: Escape beyond mid-chin Tongue control during bolus hold: Cohesive bolus between tongue to palatal seal Bolus preparation/mastication: Timely and efficient chewing and mashing Bolus transport/lingual motion: Brisk tongue motion Oral residue: Complete oral clearance Location of oral residue : N/A Initiation of pharyngeal swallow : Pyriform sinuses  Pharyngeal Impairment Domain: Pharyngeal Impairment Domain Soft palate elevation: No bolus between soft palate (SP)/pharyngeal wall (PW) Laryngeal elevation: Complete superior movement of thyroid  cartilage with complete approximation of arytenoids to epiglottic  petiole Anterior hyoid excursion: Complete anterior movement Epiglottic movement: Complete inversion Laryngeal vestibule closure: Complete, no air/contrast in laryngeal vestibule Pharyngeal stripping wave : Present - complete Pharyngeal contraction (A/P view only): N/A Pharyngoesophageal segment opening: Partial distention/partial duration, partial obstruction of flow Tongue base retraction: No contrast between tongue  base and posterior pharyngeal wall (PPW) Pharyngeal residue: Complete pharyngeal clearance Location of pharyngeal residue: N/A  Esophageal Impairment Domain: Esophageal Impairment Domain Esophageal clearance upright position: Complete clearance, esophageal coating Pill: Pill Consistency administered: Thin liquids (Level 0) Thin liquids (Level 0): [facility] Penetration/Aspiration Scale Score: Penetration/Aspiration Scale Score 1.  Material does not enter airway: Thin liquids (Level 0); Mildly thick liquids (Level 2, nectar thick); Moderately thick liquids (Level 3, honey thick); Puree; Solid; Pill Compensatory Strategies: Compensatory Strategies Compensatory strategies: No   General Information: Caregiver present: No  Diet Prior to this Study: Regular; Thin liquids (Level 0)   Temperature : Normal   Respiratory Status: WFL   Supplemental O2: None (Room air)   History of Recent Intubation: Yes  Behavior/Cognition: Alert; Cooperative; Pleasant mood Self-Feeding Abilities: Able to self-feed Baseline vocal quality/speech: Hypophonia/low volume Volitional Cough: Able to elicit Volitional Swallow: Able to elicit Exam Limitations: No limitations Goal Planning: Prognosis for improved oropharyngeal function: Good Barriers to Reach Goals: Cognitive deficits No data recorded Patient/Family Stated Goal: none stated Consulted and agree with results and recommendations: Patient Pain: Pain Assessment Pain Assessment: No/denies pain End of Session: Start Time:SLP Start Time (ACUTE ONLY): 1257 Stop Time: SLP Stop Time (ACUTE  ONLY): 1323 Time Calculation:SLP Time Calculation (min) (ACUTE ONLY): 26 min Charges: SLP Evaluations $ SLP Speech Visit: 1 Visit SLP Evaluations $BSS Swallow: 1 Procedure $MBS Swallow: 1 Procedure SLP visit diagnosis: SLP Visit Diagnosis: Dysphagia, oropharyngeal phase (R13.12) Past Medical History: Past Medical History: Diagnosis Date  Carotid artery stenosis 04/29/2018  Chronic tension headaches 04/29/2018  Essential hypertension 04/29/2018  GERD (gastroesophageal reflux disease) 04/29/2018  HLD (hyperlipidemia) 04/29/2018  Parkinson's disease (HCC) 04/29/2018 Past Surgical History: Past Surgical History: Procedure Laterality Date  BIOPSY  01/09/2022  Procedure: BIOPSY;  Surgeon: Wilhelmenia Aloha Raddle., MD;  Location: [facility];  Service: Gastroenterology;;  EGD and COLON  COLONOSCOPY WITH PROPOFOL  N/A 01/09/2022  Procedure: COLONOSCOPY WITH PROPOFOL ;  Surgeon: Wilhelmenia Aloha Raddle., MD;  Location: [facility];  Service: Gastroenterology;  Laterality: N/A;  ENDARTERECTOMY Left 04/27/2021  Procedure: LEFT CAROTID ENDARTERECTOMY;  Surgeon: Serene Gaile ORN, MD;  Location: [facility] OR;  Service: Vascular;  Laterality: Left;  ESOPHAGOGASTRODUODENOSCOPY (EGD) WITH PROPOFOL  N/A 01/09/2022  Procedure: ESOPHAGOGASTRODUODENOSCOPY (EGD) WITH PROPOFOL ;  Surgeon: Wilhelmenia Aloha Raddle., MD;  Location: [facility];  Service: Gastroenterology;  Laterality: N/A;  LEFT HEART CATH AND CORONARY ANGIOGRAPHY N/A 11/25/2019  Procedure: LEFT HEART CATH AND CORONARY ANGIOGRAPHY;  Surgeon: Claudene Victory ORN, MD;  Location: [facility];  Service: Cardiovascular;  Laterality: N/A;  PATCH ANGIOPLASTY Left 04/27/2021  Procedure: PATCH ANGIOPLASTY USING GEORGE BIOLOGIC PATCH;  Surgeon: Serene Gaile ORN, MD;  Location: [facility] OR;  Service: Vascular;  Laterality: Left;  TONSILLECTOMY   Damien Blumenthal, M.A., CCC-SLP Speech Language Pathology, Acute Rehabilitation Services Secure Chat preferred [phone] 02/25/2024, 2:01 PM    LOS: 7 days   Donalda Applebaum, MD  [facility]    To contact the attending provider between 7A-7P or the covering provider during after hours 7P-7A, please log into the web site [email or web address] and access using universal [place] password for that web site. If you do not have the password, please call the hospital operator.  02/26/2024, 1:04 PM

## 2024-02-26 NOTE — Progress Notes (Signed)
 Patient would noy allow me to take vitals. Nurse aware

## 2024-02-26 NOTE — Progress Notes (Signed)
 Occupational Therapy Treatment Patient Details Name: Terry [name] MRN: [id number] DOB: November 10, 1945 Today's Date: 02/26/2024   History of present illness 78 y.o. male presents to [facility] on 02/19/24 from [facility] for erratic behavior and AMS. Pt with acute hypoxic respiratory failure, emergently intubated from 7/29-7/31 and 8/1-8/2. Admitted with metabolic encephalopathy. PMHx:  Parkinson's disease, CVA, carotid artery stenosis, chronic headaches   OT comments  Pt resting in bed, at times lethargic, but once sitting up alert and able to participate as needed. Pt had difficulty keeping eyes open throughout session but was able to follow commands fairly consistently and displayed ability to complete transfer to recliner with mod A to maintain balance and cueing for turning and taking small steps. Pt mod/max A for LB dressing, has posterior lean when trying to don socks. Pt eager to improve and participate. Will continue to see Pt acutely to progress as able, DC plan to postacute rehab <3hrs/day still appropriate.       If plan is discharge home, recommend the following:  A little help with walking and/or transfers;A lot of help with bathing/dressing/bathroom;Assistance with cooking/housework;Direct supervision/assist for financial management;Supervision due to cognitive status;Direct supervision/assist for medications management;Help with stairs or ramp for entrance   Equipment Recommendations  BSC/3in1;Tub/shower seat    Recommendations for Other Services      Precautions / Restrictions Precautions Precautions: Fall Recall of Precautions/Restrictions: Impaired Precaution/Restrictions Comments: incontinent. Restrictions Weight Bearing Restrictions Per Provider Order: No       Mobility Bed Mobility Overal bed mobility: Needs Assistance Bed Mobility: Supine to Sit, Sit to Supine     Supine to sit: Mod assist Sit to supine: Mod assist   General bed mobility comments: mod A in/out of bed     Transfers Overall transfer level: Needs assistance Equipment used: Rolling walker (2 wheels) Transfers: Sit to/from Stand, Bed to chair/wheelchair/BSC Sit to Stand: Mod assist     Step pivot transfers: Mod assist     General transfer comment: mod A to maintain standing balance, posterior lean,     Balance Overall balance assessment: Needs assistance Sitting-balance support: No upper extremity supported, Feet supported Sitting balance-Leahy Scale: Fair Sitting balance - Comments: fair static sitting, when leaning or reaching has posterior lean Postural control: Posterior lean Standing balance support: Bilateral upper extremity supported, Reliant on assistive device for balance, During functional activity Standing balance-Leahy Scale: Poor Standing balance comment: reliant on RW and external support, posterior lean                           ADL either performed or assessed with clinical judgement   ADL Overall ADL's : Needs assistance/impaired                 Upper Body Dressing : Minimal assistance;Sitting   Lower Body Dressing: Maximal assistance;Sitting/lateral leans;Sit to/from stand   Toilet Transfer: Moderate assistance;Rolling walker (2 wheels);BSC/3in1;Stand-pivot             General ADL Comments: Pt mod A to maintain standing balance and step pivot to recliner and back, posterior lean. Pt has posterior lean when trying to don/doff B socks/shoes, mod A to maintain sitting balance.    Extremity/Trunk Assessment Upper Extremity Assessment Upper Extremity Assessment: Generalized weakness RUE Deficits / Details: shoudler stiffness, overall functional for basic tasks LUE Deficits / Details: shoudler stiffness, overall functional for basic tasks            Vision  Perception     Praxis     Communication Communication Communication: Impaired Factors Affecting Communication: Difficulty expressing self;Reduced clarity of speech    Cognition Arousal: Alert Behavior During Therapy: WFL for tasks assessed/performed Cognition: No family/caregiver present to determine baseline             OT - Cognition Comments: Pt A/O, has difficulty keeping eyes open, history of PKD                 Following commands: Intact        Cueing   Cueing Techniques: Verbal cues, Gestural cues  Exercises      Shoulder Instructions       General Comments BP sitting in recliner 80/68    Pertinent Vitals/ Pain       Pain Assessment Pain Assessment: No/denies pain  Home Living                                          Prior Functioning/Environment              Frequency  Min 2X/week        Progress Toward Goals  OT Goals(current goals can now be found in the care plan section)  Progress towards OT goals: Progressing toward goals  Acute Rehab OT Goals Patient Stated Goal: to improve functional independence OT Goal Formulation: With patient Time For Goal Achievement: 03/07/24 Potential to Achieve Goals: Fair ADL Goals Pt Will Perform Grooming: with supervision;standing Pt Will Perform Lower Body Bathing: sitting/lateral leans;with set-up Pt Will Perform Lower Body Dressing: sit to/from stand;with min assist Pt Will Transfer to Toilet: with supervision;ambulating Pt Will Perform Tub/Shower Transfer: Shower transfer;shower seat;with contact guard assist  Plan      Co-evaluation                 AM-PAC OT 6 Clicks Daily Activity     Outcome Measure   Help from another person eating meals?: A Little Help from another person taking care of personal grooming?: A Little Help from another person toileting, which includes using toliet, bedpan, or urinal?: A Little Help from another person bathing (including washing, rinsing, drying)?: A Lot Help from another person to put on and taking off regular upper body clothing?: A Little Help from another person to put on and taking  off regular lower body clothing?: A Lot 6 Click Score: 16    End of Session Equipment Utilized During Treatment: Gait belt;Rolling walker (2 wheels)  OT Visit Diagnosis: Unsteadiness on feet (R26.81);Other abnormalities of gait and mobility (R26.89);Other symptoms and signs involving cognitive function;Pain;History of falling (Z91.81) Pain - Right/Left: Left Pain - part of body: Shoulder   Activity Tolerance Patient tolerated treatment well   Patient Left in bed;with call bell/phone within reach;with bed alarm set   Nurse Communication Mobility status        Time: 1250-1320 OT Time Calculation (min): 30 min  Charges: OT General Charges $OT Visit: 1 Visit OT Treatments $Self Care/Home Management : 23-37 mins  [address] OTR/L   Jessamine Barcia R Averie Meiner 02/26/2024, 1:29 PM

## 2024-02-26 NOTE — Telephone Encounter (Signed)
 Patient Product/process development scientist completed.    The patient is insured through [employer] Patient has Medicare and is not eligible for a copay card, but may be able to apply for patient assistance or Medicare RX Payment Plan (Patient Must reach out to their plan, if eligible for payment plan), if available.    Ran test claim for Neupro  2 mg/24 hr and the current 30 day co-pay is $0.00.  Ran test claim for Neupro  1 mg/24 hr and the current 30 day co-pay is $0.00.  This test claim was processed through [facility]- copay amounts may vary at other pharmacies due to pharmacy/plan contracts, or as the patient moves through the different stages of their insurance plan.     Reyes Sharps, CPHT Pharmacy Technician III Certified Patient Advocate [facility] Patient Advocate Team Direct Number: [phone]  Fax: [phone]

## 2024-02-26 NOTE — Plan of Care (Signed)
   Problem: Nutrition: Goal: Adequate nutrition will be maintained Outcome: Progressing

## 2024-02-26 NOTE — Plan of Care (Signed)
 Discussed the case with the hospitalist.  No neurochange. Continue home dose of carbidopa /levodopa . Neupro  lower dose of 2 mg.  Consider reducing even further-will defer to outpatient management. Please call inpatient neurology with questions as needed

## 2024-02-26 NOTE — Plan of Care (Signed)
  Problem: Education: Goal: Knowledge of General Education information will improve Description: Including pain rating scale, medication(s)/side effects and non-pharmacologic comfort measures Outcome: Progressing   Problem: Health Behavior/Discharge Planning: Goal: Ability to manage health-related needs will improve Outcome: Progressing   Problem: Clinical Measurements: Goal: Ability to maintain clinical measurements within normal limits will improve Outcome: Progressing Goal: Will remain free from infection Outcome: Progressing Goal: Diagnostic test results will improve Outcome: Progressing Goal: Respiratory complications will improve Outcome: Progressing Goal: Cardiovascular complication will be avoided Outcome: Progressing   Problem: Activity: Goal: Risk for activity intolerance will decrease Outcome: Progressing   Problem: Nutrition: Goal: Adequate nutrition will be maintained Outcome: Progressing   Problem: Coping: Goal: Level of anxiety will decrease Outcome: Progressing   Problem: Elimination: Goal: Will not experience complications related to bowel motility Outcome: Progressing Goal: Will not experience complications related to urinary retention Outcome: Progressing   Problem: Pain Managment: Goal: General experience of comfort will improve and/or be controlled Outcome: Progressing   Problem: Safety: Goal: Ability to remain free from injury will improve Outcome: Progressing   Problem: Skin Integrity: Goal: Risk for impaired skin integrity will decrease Outcome: Progressing   Problem: Safety: Goal: Non-violent Restraint(s) Outcome: Progressing   Problem: Activity: Goal: Ability to tolerate increased activity will improve Outcome: Progressing   Problem: Respiratory: Goal: Ability to maintain a clear airway and adequate ventilation will improve Outcome: Progressing   Problem: Role Relationship: Goal: Method of communication will improve Outcome:  Progressing

## 2024-02-26 NOTE — TOC Progression Note (Addendum)
 Transition of Care [facility]) - Progression Note    Patient Details  Name: Terry [name] MRN: [id number] Date of Birth: 06/08/1946  [facility] CM/SW Contact  Inocente GORMAN Kindle, LCSW Phone Number: 02/26/2024, 12:31 PM  Clinical Narrative:    CSW updated [facility] on potential discharge this week. CSW initiated insurance process, Ref# [id number]. Approval received effective until 03/01/2024 (8/10 at midnight).   CSW requested [facility] check to see if patient can use his home supply of Neupro  patches or if SNF is able to order them.  Per admissions, patches are too expensive for them to fill but patient can bring in his home supply.      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                                       Social Drivers of Health (SDOH) Interventions SDOH Screenings   Food Insecurity: Low Risk  (01/07/2024)   Received from [facility]  Housing: Low Risk  (01/07/2024)   Received from [facility]  Transportation Needs: No Transportation Needs (01/07/2024)   Received from [facility]  Utilities: Low Risk  (01/07/2024)   Received from [facility]  Depression (PHQ2-9): Medium Risk (12/10/2019)  Social Connections: Unknown (10/11/2019)   Received from [facility]  Tobacco Use: Medium Risk (12/10/2023)    Readmission Risk Interventions     No data to display

## 2024-02-27 ENCOUNTER — Other Ambulatory Visit (HOSPITAL_COMMUNITY): Payer: Self-pay

## 2024-02-27 DIAGNOSIS — J9601 Acute respiratory failure with hypoxia: Secondary | ICD-10-CM | POA: Diagnosis not present

## 2024-02-27 DIAGNOSIS — J690 Pneumonitis due to inhalation of food and vomit: Secondary | ICD-10-CM

## 2024-02-27 DIAGNOSIS — R4182 Altered mental status, unspecified: Secondary | ICD-10-CM | POA: Diagnosis not present

## 2024-02-27 LAB — CREATININE, SERUM
Creatinine, Ser: 0.64 mg/dL (ref 0.61–1.24)
GFR, Estimated: >60 mL/min (ref >60)

## 2024-02-27 LAB — GLUCOSE, CAPILLARY: Glucose-Capillary: 173 mg/dL — ABNORMAL HIGH (ref 70–99)

## 2024-02-27 MED ORDER — QUETIAPINE FUMARATE 25 MG PO TABS: 25.0000 mg | ORAL_TABLET | Freq: Two times a day (BID) | ORAL | Status: DC | PRN

## 2024-02-27 MED ORDER — CEFPODOXIME PROXETIL 100 MG PO TABS: 100.0000 mg | ORAL_TABLET | Freq: Two times a day (BID) | ORAL | Status: AC

## 2024-02-27 MED ORDER — MELATONIN 5 MG PO TABS: 5.0000 mg | ORAL_TABLET | Freq: Every day | ORAL | Status: DC

## 2024-02-27 MED ORDER — ROTIGOTINE 1 MG/24HR TD PT24
2.0000 | MEDICATED_PATCH | Freq: Every day | TRANSDERMAL | Status: DC
  Administered 2024-02-27: 2 mg via TRANSDERMAL
  Filled 2024-02-27: qty 2

## 2024-02-27 MED ORDER — ROTIGOTINE 1 MG/24HR TD PT24
2.0000 | MEDICATED_PATCH | Freq: Every day | TRANSDERMAL | 1 refills | Status: DC
Start: 2024-02-27 — End: 2024-03-14
  Filled 2024-02-27: qty 30, 15d supply, fill #0

## 2024-02-27 MED ORDER — ENSURE PLUS HIGH PROTEIN PO LIQD: 237.0000 mL | Freq: Two times a day (BID) | ORAL | Status: DC

## 2024-02-27 MED ORDER — ROTIGOTINE 1 MG/24HR TD PT24: 2.0000 | MEDICATED_PATCH | Freq: Every day | TRANSDERMAL | 1 refills | Status: DC

## 2024-02-27 NOTE — Discharge Summary (Signed)
 PATIENT DETAILS Name: Terry [name] Age: 78 y.o. Sex: male Date of Birth: 1946-03-25 MRN: [id number]. Admitting Physician: Jennet DELENA Epley, MD ERE:Mpryuzm, Darice CROME, MD  Admit Date: 02/19/2024 Discharge date: 02/27/2024  Recommendations for Outpatient Follow-up:  Follow up with PCP in 1-2 weeks Please obtain CMP/CBC in one week Please ensure follow-up with neurology  Admitted From:  Home  Disposition: Skilled nursing facility   Discharge Condition: fair  CODE STATUS:   Code Status: Full Code   Diet recommendation:  Diet Order             Diet - low sodium heart healthy           Diet regular Room service appropriate? Yes with Assist; Fluid consistency: Thin  Diet effective now                    Brief Summary: Patient is a 78 y.o.  male with history of Parkinson's disease-presented with altered mental status-in the ED-received Valium /Haldol /Seroquel -with improvement but later on became altered/hypoxemic/bradycardic-intubated-admitted to the ICU.  Subsequently extubated-transferred to the floor-when he developed respiratory arrest following administration of Haldol -reintubated and transferred to the ICU.  Stabilized-extubated and transferred back to [facility] on 8/4.  See below for further details.   Significant events: 7/29>>presented with agitation and restlessness 7/30>>emergently intubated-hypoxemia, bradycardia 7/31>> extubated; transferred out of icu 8/1>>respiratory arrest intubated on floor probably related to medications 8/2>>Extubated 8/4>> transferred to [facility]   Significant studies: 7/29>> CT head: No acute intracranial abnormality 7/29>> CT angio chest/abdomen/pelvis: No evidence of aneurysm/dissection-no significant central pulmonary emboli.  No active pulmonary disease.   Significant microbiology data: 8/2>> tracheal aspirate: Normal respiratory flora   Procedures: See above.   Consults: None  Brief Hospital Course: Acute respiratory failure  secondary to inability to protect airway/aspiration pneumonia Intubated x 2 this admission-extubated 8/2-currently stabilized on room air  Continue to treat PNA with antibiotics. Minimize sedating medications Incentive spirometry/flutter valve Mobilize   Presumed aspiration pneumonia On Rocephin  during this hospitalization-will transition to oral antibiotic to complete a total of 5 days of treatment. Tracheal cultures unrevealing.   Delirium in setting of Parkinson's disease Per prior notes-mentation worsened after recent epidural steroid injection-also has had recent Parkinson medication changes as an outpatient. Management of delirium has been challenging-he has been intubated x 2 this hospitalization-with numerous medication combination.  Thankfully with avoidance of Haldol  and other standard delirium precautions-he has stabilized-for the past 2-3 days he is awake and alert and able to have a conversation Continue scheduled melatonin Continue as needed Seroquel .   Parkinson disease On Sinemet /Neupro  patch Neurology reconsulted on 8/4-Neupro  patch reduced to 2 mg-recommendations are to continue current dosing of Sinemet -and to resume usual home dosing on discharge. Will need outpatient follow-up with neurology.    Chronic tension headache Supportive care   HTN BP stable Continue amlodipine    HLD Statin   GERD PPI   Nutrition Status: Nutrition Problem: Inadequate oral intake Etiology: inability to eat Signs/Symptoms: NPO status Interventions: Ensure Enlive (each supplement provides 350kcal and 20 grams of protein), MVI   Discharge Diagnoses:  Principal Problem:   Delirium Active Problems:   AMS (altered mental status)   Discharge Instructions:  Activity:  As tolerated with Full fall precautions use walker/cane & assistance as needed  Discharge Instructions     Diet - low sodium heart healthy   Complete by: As directed    Discharge instructions   Complete by:  As directed    Follow with Primary MD  Burney Darice CROME, MD in 1-2 weeks  Please get a complete blood count and chemistry panel checked by your Primary MD at your next visit, and again as instructed by your Primary MD.  Get Medicines reviewed and adjusted: Please take all your medications with you for your next visit with your Primary MD  Laboratory/radiological data: Please request your Primary MD to go over all hospital tests and procedure/radiological results at the follow up, please ask your Primary MD to get all Hospital records sent to his/her office.  In some cases, they will be blood work, cultures and biopsy results pending at the time of your discharge. Please request that your primary care M.D. follows up on these results.  Also Note the following: If you experience worsening of your admission symptoms, develop shortness of breath, life threatening emergency, suicidal or homicidal thoughts you must seek medical attention immediately by calling 911 or calling your MD immediately  if symptoms less severe.  You must read complete instructions/literature along with all the possible adverse reactions/side effects for all the Medicines you take and that have been prescribed to you. Take any new Medicines after you have completely understood and accpet all the possible adverse reactions/side effects.   Do not drive when taking Pain medications or sleeping medications (Benzodaizepines)  Do not take more than prescribed Pain, Sleep and Anxiety Medications. It is not advisable to combine anxiety,sleep and pain medications without talking with your primary care practitioner  Special Instructions: If you have smoked or chewed Tobacco  in the last 2 yrs please stop smoking, stop any regular Alcohol  and or any Recreational drug use.  Wear Seat belts while driving.  Please note: You were cared for by a hospitalist during your hospital stay. Once you are discharged, your primary care  physician will handle any further medical issues. Please note that NO REFILLS for any discharge medications will be authorized once you are discharged, as it is imperative that you return to your primary care physician (or establish a relationship with a primary care physician if you do not have one) for your post hospital discharge needs so that they can reassess your need for medications and monitor your lab values.   Increase activity slowly   Complete by: As directed    No wound care   Complete by: As directed       Allergies as of 02/27/2024       Reactions   Erythromycin Rash, Dermatitis, Other (See Comments)   Other Reaction(s): Unknown Stomach Cramps erythromycin   Penicillins Rash, Dermatitis, Hives, Other (See Comments)   Other Reaction(s): Unknown Product containing penicillin (product)   Azithromycin Other (See Comments)   GI upset azithromycin   Clam Shell Nausea And Vomiting   Oyster Extract Nausea And Vomiting, Other (See Comments)   clam allergenic extract   Haloperidol  And Related Other (See Comments)   Respiratory arrest requiring intubation twice.         Medication List     STOP taking these medications    Neupro  4 MG/24HR Generic drug: rotigotine  Replaced by: Rotigotine  1 MG/24HR Pt24       TAKE these medications    acetaminophen  325 MG tablet Commonly known as: TYLENOL  Take 325 mg by mouth daily as needed for mild pain (pain score 1-3).   aspirin  EC 81 MG tablet Take 1 tablet (81 mg total) by mouth daily. Swallow whole. What changed: when to take this   atorvastatin  20 MG tablet Commonly known  as: LIPITOR TAKE ONE TABLET BY MOUTH EVERY NIGHT AT BEDTIME   baclofen  10 MG tablet Commonly known as: LIORESAL  Take 5-10 mg by mouth 3 (three) times daily as needed for muscle spasms.   brimonidine -timolol  0.2-0.5 % ophthalmic solution Commonly known as: COMBIGAN  Apply 1 drop to eye 2 (two) times daily.   Crexont  87.5-350 MG Cpcr Generic  drug: Carbidopa -Levodopa  ER Take 2 capsules by mouth in the morning, at noon, and at bedtime. 0600, 1100, 1700 What changed: Another medication with the same name was removed. Continue taking this medication, and follow the directions you see here.   carbidopa -levodopa  50-200 MG tablet Commonly known as: SINEMET  CR Take 1 tablet by mouth at bedtime. What changed: Another medication with the same name was removed. Continue taking this medication, and follow the directions you see here.   cefpodoxime  100 MG tablet Commonly known as: VANTIN  Take 1 tablet (100 mg total) by mouth 2 (two) times daily for 1 day.   Euflexxa 20 MG/2ML Sosy Generic drug: Sodium Hyaluronate (Viscosup) Inject 20 mg into the articular space once a week. For 3 weeks   feeding supplement Liqd Take 237 mLs by mouth 2 (two) times daily between meals.   hydrALAZINE  50 MG tablet Commonly known as: APRESOLINE  TAKE 1 TABLET(50 MG) BY MOUTH TWICE DAILY What changed: See the new instructions.   M-Natal Plus 27-1 MG Tabs Take 1 tablet by mouth daily.   melatonin 5 MG Tabs Take 1 tablet (5 mg total) by mouth at bedtime.   meloxicam  7.5 MG tablet Commonly known as: MOBIC  Take 7.5 mg by mouth 2 (two) times daily as needed.   olopatadine  0.1 % ophthalmic solution Commonly known as: PATANOL Place 1 drop into both eyes daily as needed for allergies.   ondansetron  4 MG tablet Commonly known as: ZOFRAN  Take 4 mg by mouth every 8 (eight) hours as needed.   pantoprazole  40 MG tablet Commonly known as: PROTONIX  TAKE 1 TABLET BY MOUTH EVERY DAY   polyethylene glycol powder 17 GM/SCOOP powder Commonly known as: GLYCOLAX /MIRALAX  Take 17 g by mouth daily.   QUEtiapine  25 MG tablet Commonly known as: SEROQUEL  Take 1 tablet (25 mg total) by mouth 2 (two) times daily as needed (agitation). What changed:  when to take this reasons to take this   Rotigotine  1 MG/24HR Pt24 Place 2 patches (2 mg total) onto the skin  daily. Replaces: Neupro  4 MG/24HR   traZODone  50 MG tablet Commonly known as: DESYREL  Take 1 tablet (50 mg total) by mouth at bedtime as needed for sleep. What changed: when to take this   triamcinolone ointment 0.1 % Commonly known as: KENALOG Apply topically 3 (three) times daily.   VITAMIN B-12 IJ Inject 1,000 mcg as directed every 30 (thirty) days.   Vitamin D (Ergocalciferol) 1.25 MG (50000 UNIT) Caps capsule Commonly known as: DRISDOL Take 50,000 Units by mouth every Tuesday.        Follow-up Information     Burney Darice CROME, MD. Schedule an appointment as soon as possible for a visit in 1 week(s).   Specialty: Family Medicine Contact information: [address] [place] [place] [address] [phone]         Merilee Rockey Boas, MD. Schedule an appointment as soon as possible for a visit in 1 week(s).   Specialty: Neurology Contact information: [address] [place] [address] [phone]                Allergies  Allergen Reactions   Erythromycin Rash, Dermatitis and Other (See Comments)    Other Reaction(s): Unknown  Stomach Cramps  erythromycin   Penicillins Rash, Dermatitis, Hives and Other (See Comments)    Other Reaction(s): Unknown  Product containing penicillin (product)   Azithromycin Other (See Comments)    GI upset  azithromycin   Clam Shell Nausea And Vomiting   Oyster Extract Nausea And Vomiting and Other (See Comments)    clam allergenic extract   Haloperidol  And Related Other (See Comments)    Respiratory arrest requiring intubation twice.      Other Procedures/Studies: DG Swallowing Func-Speech Pathology Result Date: 02/25/2024 Table formatting from the original result was not included. Modified Barium Swallow Study Patient Details Name: Terry [name] MRN: [id number] Date of Birth: [birth year] Today's Date: 02/25/2024 HPI/PMH: HPI: Keiran Gaffey is a 78 yo male with PMH including Parkinson's disease, CVA, GERD,  carotid artery stenosis, chronic headaches who presents 7/29 with AMS and erratic behavior. Emergently intubated 7/30-7/31 and 8/1-8/2, thought to be secondary to medication-induced hypoxia. Pt passed the swallow screen after both extubations. Seen by SLP 01/07/24 with recommendations to continue regular diet with thin liquids. Clinical Impression: Clinical Impression: Pt presents with an overall functional oropharyngeal swallow. He has deficits related to coordination and strength which are suspected to be related to Parkinson's disease, for which he compensates well without significant residue or penetration/aspiration. Coughing was noted at the end of the study prior to transferring pt back to bed but no airway invasion or retrograde esophageal backflow was noted with fluoro. Provided education with recommendations to continue current diet. Will sign off at this time but discussed f/u for hypophonia at pt's next venue of care. Factors that may increase risk of adverse event in presence of aspiration Noe & Lianne 2021): Factors that may increase risk of adverse event in presence of aspiration Noe & Lianne 2021): Poor general health and/or compromised immunity; Reduced cognitive function; Weak cough Recommendations/Plan: Swallowing Evaluation Recommendations Swallowing Evaluation Recommendations Recommendations: PO diet PO Diet Recommendation: Regular; Thin liquids (Level 0) Liquid Administration via: Cup; Straw Medication Administration: Whole meds with liquid Supervision: Patient able to self-feed Swallowing strategies  : Minimize environmental distractions; Slow rate; Small bites/sips Postural changes: Position pt fully upright for meals; Stay upright 30-60 min after meals Oral care recommendations: Oral care BID (2x/day) Treatment Plan Treatment Plan Treatment recommendations: No treatment recommended at this time Follow-up recommendations: Skilled nursing-short term rehab (<3 hours/day) Functional  status assessment: Patient has not had a recent decline in their functional status. Recommendations Recommendations for follow up therapy are one component of a multi-disciplinary discharge planning process, led by the attending physician.  Recommendations may be updated based on patient status, additional functional criteria and insurance authorization. Assessment: Orofacial Exam: Orofacial Exam Oral Cavity: Oral Hygiene: WFL Oral Cavity - Dentition: Adequate natural dentition Orofacial Anatomy: WFL Oral Motor/Sensory Function: WFL Anatomy: Anatomy: Suspected cervical osteophytes; Prominent cricopharyngeus Boluses Administered: Boluses Administered Boluses Administered: Thin liquids (Level 0); Mildly thick liquids (Level 2, nectar thick); Moderately thick liquids (Level 3, honey thick); Puree; Solid  Oral Impairment Domain: Oral Impairment Domain Lip Closure: Escape beyond mid-chin Tongue control during bolus hold: Cohesive bolus between tongue to palatal seal Bolus preparation/mastication: Timely and efficient chewing and mashing Bolus transport/lingual motion: Brisk tongue motion Oral residue: Complete oral clearance Location of oral residue : N/A Initiation of pharyngeal swallow : Pyriform sinuses  Pharyngeal Impairment Domain: Pharyngeal Impairment Domain Soft palate elevation: No bolus between soft palate (SP)/pharyngeal  wall (PW) Laryngeal elevation: Complete superior movement of thyroid  cartilage with complete approximation of arytenoids to epiglottic petiole Anterior hyoid excursion: Complete anterior movement Epiglottic movement: Complete inversion Laryngeal vestibule closure: Complete, no air/contrast in laryngeal vestibule Pharyngeal stripping wave : Present - complete Pharyngeal contraction (A/P view only): N/A Pharyngoesophageal segment opening: Partial distention/partial duration, partial obstruction of flow Tongue base retraction: No contrast between tongue base and posterior pharyngeal wall (PPW)  Pharyngeal residue: Complete pharyngeal clearance Location of pharyngeal residue: N/A  Esophageal Impairment Domain: Esophageal Impairment Domain Esophageal clearance upright position: Complete clearance, esophageal coating Pill: Pill Consistency administered: Thin liquids (Level 0) Thin liquids (Level 0): [facility] Penetration/Aspiration Scale Score: Penetration/Aspiration Scale Score 1.  Material does not enter airway: Thin liquids (Level 0); Mildly thick liquids (Level 2, nectar thick); Moderately thick liquids (Level 3, honey thick); Puree; Solid; Pill Compensatory Strategies: Compensatory Strategies Compensatory strategies: No   General Information: Caregiver present: No  Diet Prior to this Study: Regular; Thin liquids (Level 0)   Temperature : Normal   Respiratory Status: WFL   Supplemental O2: None (Room air)   History of Recent Intubation: Yes  Behavior/Cognition: Alert; Cooperative; Pleasant mood Self-Feeding Abilities: Able to self-feed Baseline vocal quality/speech: Hypophonia/low volume Volitional Cough: Able to elicit Volitional Swallow: Able to elicit Exam Limitations: No limitations Goal Planning: Prognosis for improved oropharyngeal function: Good Barriers to Reach Goals: Cognitive deficits No data recorded Patient/Family Stated Goal: none stated Consulted and agree with results and recommendations: Patient Pain: Pain Assessment Pain Assessment: No/denies pain End of Session: Start Time:SLP Start Time (ACUTE ONLY): 1257 Stop Time: SLP Stop Time (ACUTE ONLY): 1323 Time Calculation:SLP Time Calculation (min) (ACUTE ONLY): 26 min Charges: SLP Evaluations $ SLP Speech Visit: 1 Visit SLP Evaluations $BSS Swallow: 1 Procedure $MBS Swallow: 1 Procedure SLP visit diagnosis: SLP Visit Diagnosis: Dysphagia, oropharyngeal phase (R13.12) Past Medical History: Past Medical History: Diagnosis Date  Carotid artery stenosis 04/29/2018  Chronic tension headaches 04/29/2018  Essential hypertension 04/29/2018  GERD  (gastroesophageal reflux disease) 04/29/2018  HLD (hyperlipidemia) 04/29/2018  Parkinson's disease (HCC) 04/29/2018 Past Surgical History: Past Surgical History: Procedure Laterality Date  BIOPSY  01/09/2022  Procedure: BIOPSY;  Surgeon: Wilhelmenia Aloha Raddle., MD;  Location: [facility];  Service: Gastroenterology;;  EGD and COLON  COLONOSCOPY WITH PROPOFOL  N/A 01/09/2022  Procedure: COLONOSCOPY WITH PROPOFOL ;  Surgeon: Wilhelmenia Aloha Raddle., MD;  Location: [facility];  Service: Gastroenterology;  Laterality: N/A;  ENDARTERECTOMY Left 04/27/2021  Procedure: LEFT CAROTID ENDARTERECTOMY;  Surgeon: Serene Gaile ORN, MD;  Location: [facility];  Service: Vascular;  Laterality: Left;  ESOPHAGOGASTRODUODENOSCOPY (EGD) WITH PROPOFOL  N/A 01/09/2022  Procedure: ESOPHAGOGASTRODUODENOSCOPY (EGD) WITH PROPOFOL ;  Surgeon: Wilhelmenia Aloha Raddle., MD;  Location: [facility];  Service: Gastroenterology;  Laterality: N/A;  LEFT HEART CATH AND CORONARY ANGIOGRAPHY N/A 11/25/2019  Procedure: LEFT HEART CATH AND CORONARY ANGIOGRAPHY;  Surgeon: Claudene Victory ORN, MD;  Location: [facility];  Service: Cardiovascular;  Laterality: N/A;  PATCH ANGIOPLASTY Left 04/27/2021  Procedure: PATCH ANGIOPLASTY USING GEORGE BIOLOGIC PATCH;  Surgeon: Serene Gaile ORN, MD;  Location: [facility];  Service: Vascular;  Laterality: Left;  TONSILLECTOMY   Damien Blumenthal, M.A., CCC-SLP Speech Language Pathology, Acute Rehabilitation Services Secure Chat preferred [phone] 02/25/2024, 2:01 PM  DG CHEST PORT 1 VIEW Result Date: 02/23/2024 EXAM: 1 VIEW XRAY OF THE CHEST 02/23/2024 12:10:59 AM COMPARISON: 02/21/2024 CLINICAL HISTORY: Respiratory arrest (HCC) 10236. Resp arrest FINDINGS: LUNGS AND PLEURA: The lungs are clear. No focal pulmonary opacity. No pulmonary edema. No  pleural effusion. No pneumothorax. HEART AND MEDIASTINUM: Stable cardiomediastinal silhouette. No acute abnormality of the cardiac and mediastinal silhouettes. BONES AND SOFT TISSUES: No acute  osseous abnormality. Defibrillator pads overlie the chest. Loop recorder. LINES AND TUBES: Endotracheal tube tip in the upper intrathoracic trachea 7.3 cm from the carina. IMPRESSION: 1. No acute process. 2. Endotracheal tube tip in the upper intrathoracic trachea, 7.3 cm from the carina. Electronically signed by: Norman Gatlin MD 02/23/2024 12:16 AM EDT RP Workstation: [id number]   DG Chest Port 1 View Result Date: 02/21/2024 EXAM: 1 VIEW XRAY OF THE CHEST 02/21/2024 06:04:00 AM COMPARISON: 02/20/2024 CLINICAL HISTORY: Tube. Encounter for tube. FINDINGS: LUNGS AND PLEURA: No focal pulmonary opacity. No pulmonary edema. No pleural effusion. No pneumothorax. HEART AND MEDIASTINUM: No acute abnormality of the cardiac and mediastinal silhouettes. Loop recorder is noted in the projection of the left side of the heart. BONES AND SOFT TISSUES: No acute osseous abnormality. LINES AND TUBES: The ET tube tip is 4.9 cm above the carina. There is an enteric tube with sidebend below the level of the GE junction. IMPRESSION: 1. No acute cardiopulmonary pathology. 2. ET tube and enteric tube in appropriate position. Loop recorder in the projection of the left side of the heart. Electronically signed by: Waddell Calk MD 02/21/2024 06:17 AM EDT RP Workstation: [id number]   DG Abd 1 View Result Date: 02/20/2024 CLINICAL DATA:  Evaluate for orogastric tube placement EXAM: ABDOMEN - 1 VIEW COMPARISON:  None Available. FINDINGS: Enteric tube in place, tip terminates overlying the gastric cardia . Visualized lower chest is unremarkable. The bowel gas pattern is normal. No radio-opaque calculi or other significant radiographic abnormality are seen. IMPRESSION: Proper position of orogastric tube. Electronically Signed   By: Megan  Zare M.D.   On: 02/20/2024 12:27   DG Chest Portable 1 View Result Date: 02/20/2024 CLINICAL DATA:  Endotracheal tube placement. EXAM: PORTABLE CHEST 1 VIEW COMPARISON:  10/10/2023 FINDINGS:  Endotracheal tube has tip 6.2 cm above the carina. Enteric tube courses into the region of the stomach and off the film as tip is not visualized although side-port is over the stomach in the left upper quadrant. Lungs are adequately inflated with hazy prominence of the central pulmonary vessels suggesting mild vascular congestion/edema. No evidence of effusion or pneumothorax. Cardiomediastinal silhouette and remainder of the exam is unchanged. IMPRESSION: 1. Endotracheal tube has tip 6.2 cm above the carina. 2. Mild vascular congestion/edema. Electronically Signed   By: Toribio Agreste M.D.   On: 02/20/2024 08:14   CT Head Wo Contrast Result Date: 02/19/2024 CLINICAL DATA:  Provided history: Delirium. EXAM: CT HEAD WITHOUT CONTRAST TECHNIQUE: Contiguous axial images were obtained from the base of the skull through the vertex without intravenous contrast. RADIATION DOSE REDUCTION: This exam was performed according to the departmental dose-optimization program which includes automated exposure control, adjustment of the mA and/or kV according to patient size and/or use of iterative reconstruction technique. COMPARISON:  CT angiogram head/neck 01/07/2024. FINDINGS: Motion degraded examination. Within this limitation, findings are as follows. Brain: Generalized cerebral atrophy. There is no acute intracranial hemorrhage. No demarcated cortical infarct. No extra-axial fluid collection. No evidence of an intracranial mass. No midline shift. Vascular: No hyperdense vessel. Atherosclerotic calcifications. Skull: No calvarial fracture or aggressive osseous lesion. Sinuses/Orbits: No mass or acute finding within the imaged orbits. No significant paranasal sinus disease. Other: Trace fluid within left mastoid air cells. IMPRESSION: 1. Motion degraded examination. Within this limitation, no acute intracranial abnormality is identified. 2. Generalized  cerebral atrophy. 3. Trace left mastoid effusion. Electronically Signed    By: Rockey Childs D.O.   On: 02/19/2024 18:23   CT Angio Chest/Abd/Pel for Dissection W and/or Wo Contrast Result Date: 02/19/2024 CLINICAL DATA:  Acute aortic syndrome suspected. Patient has hyperventilating with erratic behavior. EXAM: CT ANGIOGRAPHY CHEST, ABDOMEN AND PELVIS TECHNIQUE: Non-contrast CT of the chest was initially obtained. Multidetector CT imaging through the chest, abdomen and pelvis was performed using the standard protocol during bolus administration of intravenous contrast. Multiplanar reconstructed images and MIPs were obtained and reviewed to evaluate the vascular anatomy. RADIATION DOSE REDUCTION: This exam was performed according to the departmental dose-optimization program which includes automated exposure control, adjustment of the mA and/or kV according to patient size and/or use of iterative reconstruction technique. CONTRAST:  75mL OMNIPAQUE  IOHEXOL  350 MG/ML SOLN COMPARISON:  Chest radiograph 10/10/2023. CT abdomen and pelvis 01/07/2024. CTA abdomen and pelvis 07/19/2021 FINDINGS: CTA CHEST FINDINGS Cardiovascular: Unenhanced images of the chest demonstrate calcification in the aorta and coronary arteries. No evidence of acute intramural hematoma. Images obtained during the arterial phase after contrast material administration demonstrate normal caliber thoracic aorta. Motion artifact is present but there is no evidence of aortic dissection. Great vessel origins are patent. Normal heart size. No pericardial effusions. Central pulmonary arteries appear patent without evidence of central pulmonary embolus. Mediastinum/Nodes: No enlarged mediastinal, hilar, or axillary lymph nodes. Thyroid  gland, trachea, and esophagus demonstrate no significant findings. Loop recorder in the left anterior chest wall. Lungs/Pleura: Calcified granuloma in the lingula. Lungs are otherwise clear. No pleural effusion or pneumothorax. Musculoskeletal: Degenerative changes in the spine. No acute bony  abnormalities are seen. Motion artifact limits evaluation. Review of the MIP images confirms the above findings. CTA ABDOMEN AND PELVIS FINDINGS VASCULAR Aorta: Normal caliber aorta without aneurysm, dissection, vasculitis or significant stenosis. Calcification of the aorta. Celiac: Motion artifact limits evaluation but the skin back axis appears patent despite calcification at the origin. SMA: Motion artifact limits evaluation but the superior mesenteric artery appears patent despite calcification at the origin. Renals: Duplicated right and single left renal arteries appear patent. Calcification at the origin. IMA: Patent without evidence of aneurysm, dissection, vasculitis or significant stenosis. Inflow: Patent without evidence of aneurysm, dissection, vasculitis or significant stenosis. Veins: No obvious venous abnormality within the limitations of this arterial phase study. Review of the MIP images confirms the above findings. NON-VASCULAR Hepatobiliary: No focal liver abnormality is seen. No gallstones, gallbladder wall thickening, or biliary dilatation. Pancreas: Unremarkable. No pancreatic ductal dilatation or surrounding inflammatory changes. Spleen: Normal in size without focal abnormality. Adrenals/Urinary Tract: Adrenal glands are unremarkable. Kidneys are normal, without renal calculi, focal lesion, or hydronephrosis. Bladder is unremarkable. Stomach/Bowel: Stomach, small bowel, and colon are not abnormally distended. No wall thickening or inflammatory changes are seen. Appendix is not seen. Lymphatic: No significant lymphadenopathy. Reproductive: Prostate is unremarkable. Other: No free air or free fluid in the abdomen. Abdominal wall musculature appears intact. Musculoskeletal: Degenerative changes in the spine. No acute bony abnormalities. Review of the MIP images confirms the above findings. IMPRESSION: 1. Motion artifact limits examination. 2. No evidence of aneurysm, dissection, or significant  stenosis in the thoracic or abdominal aorta. Aortic atherosclerosis. 3. No significant central pulmonary embolus is identified. 4. No evidence of active pulmonary disease. 5. No acute process demonstrated in the abdomen or pelvis. Electronically Signed   By: Elsie Gravely M.D.   On: 02/19/2024 18:13     TODAY-DAY OF DISCHARGE:  Subjective:  Terry [name] today has no headache,no chest abdominal pain,no new weakness tingling or numbness, feels much better wants to go home today.   Objective:   Blood pressure (!) 157/58, pulse 65, temperature (!) 97.4 F (36.3 C), temperature source Oral, resp. rate 15, height 6' (1.829 m), weight 71.7 kg, SpO2 96%.  Intake/Output Summary (Last 24 hours) at 02/27/2024 0940 Last data filed at 02/26/2024 1821 Gross per 24 hour  Intake --  Output 600 ml  Net -600 ml   Filed Weights   02/24/24 0500 02/25/24 0504 02/26/24 0513  Weight: 74.1 kg 73.1 kg 71.7 kg    Exam: Awake Alert, Oriented *3, No new F.N deficits, Normal affect [place].AT,PERRAL Supple Neck,No JVD, No cervical lymphadenopathy appriciated.  Symmetrical Chest wall movement, Good air movement bilaterally, CTAB RRR,No Gallops,Rubs or new Murmurs, No Parasternal Heave +ve B.Sounds, Abd Soft, Non tender, No organomegaly appriciated, No rebound -guarding or rigidity. No Cyanosis, Clubbing or edema, No new Rash or bruise   PERTINENT RADIOLOGIC STUDIES: DG Swallowing Func-Speech Pathology Result Date: 02/25/2024 Table formatting from the original result was not included. Modified Barium Swallow Study Patient Details Name: Yidel Teuscher MRN: [id number] Date of Birth: 06-Apr-1946 Today's Date: 02/25/2024 HPI/PMH: HPI: Terry [name] is a 78 yo male with PMH including Parkinson's disease, CVA, GERD, carotid artery stenosis, chronic headaches who presents 7/29 with AMS and erratic behavior. Emergently intubated 7/30-7/31 and 8/1-8/2, thought to be secondary to medication-induced hypoxia. Pt passed the swallow  screen after both extubations. Seen by SLP 01/07/24 with recommendations to continue regular diet with thin liquids. Clinical Impression: Clinical Impression: Pt presents with an overall functional oropharyngeal swallow. He has deficits related to coordination and strength which are suspected to be related to Parkinson's disease, for which he compensates well without significant residue or penetration/aspiration. Coughing was noted at the end of the study prior to transferring pt back to bed but no airway invasion or retrograde esophageal backflow was noted with fluoro. Provided education with recommendations to continue current diet. Will sign off at this time but discussed f/u for hypophonia at pt's next venue of care. Factors that may increase risk of adverse event in presence of aspiration Noe & Lianne 2021): Factors that may increase risk of adverse event in presence of aspiration Noe & Lianne 2021): Poor general health and/or compromised immunity; Reduced cognitive function; Weak cough Recommendations/Plan: Swallowing Evaluation Recommendations Swallowing Evaluation Recommendations Recommendations: PO diet PO Diet Recommendation: Regular; Thin liquids (Level 0) Liquid Administration via: Cup; Straw Medication Administration: Whole meds with liquid Supervision: Patient able to self-feed Swallowing strategies  : Minimize environmental distractions; Slow rate; Small bites/sips Postural changes: Position pt fully upright for meals; Stay upright 30-60 min after meals Oral care recommendations: Oral care BID (2x/day) Treatment Plan Treatment Plan Treatment recommendations: No treatment recommended at this time Follow-up recommendations: Skilled nursing-short term rehab (<3 hours/day) Functional status assessment: Patient has not had a recent decline in their functional status. Recommendations Recommendations for follow up therapy are one component of a multi-disciplinary discharge planning process, led by  the attending physician.  Recommendations may be updated based on patient status, additional functional criteria and insurance authorization. Assessment: Orofacial Exam: Orofacial Exam Oral Cavity: Oral Hygiene: WFL Oral Cavity - Dentition: Adequate natural dentition Orofacial Anatomy: WFL Oral Motor/Sensory Function: WFL Anatomy: Anatomy: Suspected cervical osteophytes; Prominent cricopharyngeus Boluses Administered: Boluses Administered Boluses Administered: Thin liquids (Level 0); Mildly thick liquids (Level 2, nectar thick); Moderately thick liquids (Level 3, honey thick); Puree; Solid  Oral  Impairment Domain: Oral Impairment Domain Lip Closure: Escape beyond mid-chin Tongue control during bolus hold: Cohesive bolus between tongue to palatal seal Bolus preparation/mastication: Timely and efficient chewing and mashing Bolus transport/lingual motion: Brisk tongue motion Oral residue: Complete oral clearance Location of oral residue : N/A Initiation of pharyngeal swallow : Pyriform sinuses  Pharyngeal Impairment Domain: Pharyngeal Impairment Domain Soft palate elevation: No bolus between soft palate (SP)/pharyngeal wall (PW) Laryngeal elevation: Complete superior movement of thyroid  cartilage with complete approximation of arytenoids to epiglottic petiole Anterior hyoid excursion: Complete anterior movement Epiglottic movement: Complete inversion Laryngeal vestibule closure: Complete, no air/contrast in laryngeal vestibule Pharyngeal stripping wave : Present - complete Pharyngeal contraction (A/P view only): N/A Pharyngoesophageal segment opening: Partial distention/partial duration, partial obstruction of flow Tongue base retraction: No contrast between tongue base and posterior pharyngeal wall (PPW) Pharyngeal residue: Complete pharyngeal clearance Location of pharyngeal residue: N/A  Esophageal Impairment Domain: Esophageal Impairment Domain Esophageal clearance upright position: Complete clearance, esophageal  coating Pill: Pill Consistency administered: Thin liquids (Level 0) Thin liquids (Level 0): [facility] Penetration/Aspiration Scale Score: Penetration/Aspiration Scale Score 1.  Material does not enter airway: Thin liquids (Level 0); Mildly thick liquids (Level 2, nectar thick); Moderately thick liquids (Level 3, honey thick); Puree; Solid; Pill Compensatory Strategies: Compensatory Strategies Compensatory strategies: No   General Information: Caregiver present: No  Diet Prior to this Study: Regular; Thin liquids (Level 0)   Temperature : Normal   Respiratory Status: WFL   Supplemental O2: None (Room air)   History of Recent Intubation: Yes  Behavior/Cognition: Alert; Cooperative; Pleasant mood Self-Feeding Abilities: Able to self-feed Baseline vocal quality/speech: Hypophonia/low volume Volitional Cough: Able to elicit Volitional Swallow: Able to elicit Exam Limitations: No limitations Goal Planning: Prognosis for improved oropharyngeal function: Good Barriers to Reach Goals: Cognitive deficits No data recorded Patient/Family Stated Goal: none stated Consulted and agree with results and recommendations: Patient Pain: Pain Assessment Pain Assessment: No/denies pain End of Session: Start Time:SLP Start Time (ACUTE ONLY): 1257 Stop Time: SLP Stop Time (ACUTE ONLY): 1323 Time Calculation:SLP Time Calculation (min) (ACUTE ONLY): 26 min Charges: SLP Evaluations $ SLP Speech Visit: 1 Visit SLP Evaluations $BSS Swallow: 1 Procedure $MBS Swallow: 1 Procedure SLP visit diagnosis: SLP Visit Diagnosis: Dysphagia, oropharyngeal phase (R13.12) Past Medical History: Past Medical History: Diagnosis Date  Carotid artery stenosis 04/29/2018  Chronic tension headaches 04/29/2018  Essential hypertension 04/29/2018  GERD (gastroesophageal reflux disease) 04/29/2018  HLD (hyperlipidemia) 04/29/2018  Parkinson's disease (HCC) 04/29/2018 Past Surgical History: Past Surgical History: Procedure Laterality Date  BIOPSY  01/09/2022  Procedure: BIOPSY;   Surgeon: Wilhelmenia Aloha Raddle., MD;  Location: [facility];  Service: Gastroenterology;;  EGD and COLON  COLONOSCOPY WITH PROPOFOL  N/A 01/09/2022  Procedure: COLONOSCOPY WITH PROPOFOL ;  Surgeon: Wilhelmenia Aloha Raddle., MD;  Location: [facility];  Service: Gastroenterology;  Laterality: N/A;  ENDARTERECTOMY Left 04/27/2021  Procedure: LEFT CAROTID ENDARTERECTOMY;  Surgeon: Serene Gaile ORN, MD;  Location: [facility] OR;  Service: Vascular;  Laterality: Left;  ESOPHAGOGASTRODUODENOSCOPY (EGD) WITH PROPOFOL  N/A 01/09/2022  Procedure: ESOPHAGOGASTRODUODENOSCOPY (EGD) WITH PROPOFOL ;  Surgeon: Wilhelmenia Aloha Raddle., MD;  Location: [facility];  Service: Gastroenterology;  Laterality: N/A;  LEFT HEART CATH AND CORONARY ANGIOGRAPHY N/A 11/25/2019  Procedure: LEFT HEART CATH AND CORONARY ANGIOGRAPHY;  Surgeon: Claudene Victory ORN, MD;  Location: [facility];  Service: Cardiovascular;  Laterality: N/A;  PATCH ANGIOPLASTY Left 04/27/2021  Procedure: PATCH ANGIOPLASTY USING GEORGE BIOLOGIC PATCH;  Surgeon: Serene Gaile ORN, MD;  Location: [facility] OR;  Service:  Vascular;  Laterality: Left;  TONSILLECTOMY   Damien Blumenthal, M.A., CCC-SLP Speech Language Pathology, Acute Rehabilitation Services Secure Chat preferred [phone] 02/25/2024, 2:01 PM    PERTINENT LAB RESULTS: CBC: Recent Labs    02/25/24 0853  WBC 10.3  HGB 11.5*  HCT 34.7*  PLT 284   CMET CMP     Component Value Date/Time   NA 138 02/25/2024 0853   NA 143 11/18/2019 1216   K 4.3 02/25/2024 0853   CL 101 02/25/2024 0853   CO2 24 02/25/2024 0853   GLUCOSE 111 (H) 02/25/2024 0853   BUN 11 02/25/2024 0853   BUN 23 11/18/2019 1216   CREATININE 0.64 02/27/2024 0557   CALCIUM  8.8 (L) 02/25/2024 0853   PROT 6.4 (L) 02/25/2024 0853   ALBUMIN 2.9 (L) 02/25/2024 0853   AST 23 02/25/2024 0853   ALT <5 02/25/2024 0853   ALKPHOS 55 02/25/2024 0853   BILITOT 0.8 02/25/2024 0853   GFRNONAA >60 02/27/2024 0557    GFR Estimated Creatinine Clearance: 78.4  mL/min (by C-G formula based on SCr of 0.64 mg/dL). No results for input(s): LIPASE, AMYLASE in the last 72 hours. No results for input(s): CKTOTAL, CKMB, CKMBINDEX, TROPONINI in the last 72 hours. Invalid input(s): POCBNP No results for input(s): DDIMER in the last 72 hours. No results for input(s): HGBA1C in the last 72 hours. No results for input(s): CHOL, HDL, LDLCALC, TRIG, CHOLHDL, LDLDIRECT in the last 72 hours. No results for input(s): TSH, T4TOTAL, T3FREE, THYROIDAB in the last 72 hours.  Invalid input(s): FREET3 No results for input(s): VITAMINB12, FOLATE, FERRITIN, TIBC, IRON, RETICCTPCT in the last 72 hours. Coags: No results for input(s): INR in the last 72 hours.  Invalid input(s): PT Microbiology: Recent Results (from the past 240 hours)  MRSA Next Gen by PCR, Nasal     Status: Abnormal   Collection Time: 02/20/24 10:15 AM   Specimen: Nasal Mucosa; Nasal Swab  Result Value Ref Range Status   MRSA by PCR Next Gen DETECTED (A) NOT DETECTED Final    Comment: RESULT CALLED TO, READ BACK BY AND VERIFIED WITH: RN IRIS NGODA ON 02/20/24 @ 1228 BY DRT (NOTE) The GeneXpert MRSA Assay (FDA approved for NASAL specimens only), is one component of a comprehensive MRSA colonization surveillance program. It is not intended to diagnose MRSA infection nor to guide or monitor treatment for MRSA infections. Test performance is not FDA approved in patients less than 40 years old. Performed at [facility], [address], [place], [place] [address]   Culture, Respiratory w Gram Stain     Status: None   Collection Time: 02/23/24 10:33 AM   Specimen: Tracheal Aspirate; Respiratory  Result Value Ref Range Status   Specimen Description TRACHEAL ASPIRATE  Final   Special Requests NONE  Final   Gram Stain   Final    MODERATE WBC PRESENT, PREDOMINANTLY PMN FEW SQUAMOUS EPITHELIAL CELLS PRESENT MODERATE GRAM POSITIVE  COCCI RARE GRAM NEGATIVE RODS    Culture   Final    FEW Normal respiratory flora-no Staph aureus or Pseudomonas seen Performed at [facility], [address], [place], [place] [address]    Report Status 02/25/2024 FINAL  Final    FURTHER DISCHARGE INSTRUCTIONS:  Get Medicines reviewed and adjusted: Please take all your medications with you for your next visit with your Primary MD  Laboratory/radiological data: Please request your Primary MD to go over all hospital tests and procedure/radiological results at the follow up,  please ask your Primary MD to get all Hospital records sent to his/her office.  In some cases, they will be blood work, cultures and biopsy results pending at the time of your discharge. Please request that your primary care M.D. goes through all the records of your hospital data and follows up on these results.  Also Note the following: If you experience worsening of your admission symptoms, develop shortness of breath, life threatening emergency, suicidal or homicidal thoughts you must seek medical attention immediately by calling 911 or calling your MD immediately  if symptoms less severe.  You must read complete instructions/literature along with all the possible adverse reactions/side effects for all the Medicines you take and that have been prescribed to you. Take any new Medicines after you have completely understood and accpet all the possible adverse reactions/side effects.   Do not drive when taking Pain medications or sleeping medications (Benzodaizepines)  Do not take more than prescribed Pain, Sleep and Anxiety Medications. It is not advisable to combine anxiety,sleep and pain medications without talking with your primary care practitioner  Special Instructions: If you have smoked or chewed Tobacco  in the last 2 yrs please stop smoking, stop any regular Alcohol  and or any Recreational drug use.  Wear Seat belts while driving.  Please note: You  were cared for by a hospitalist during your hospital stay. Once you are discharged, your primary care physician will handle any further medical issues. Please note that NO REFILLS for any discharge medications will be authorized once you are discharged, as it is imperative that you return to your primary care physician (or establish a relationship with a primary care physician if you do not have one) for your post hospital discharge needs so that they can reassess your need for medications and monitor your lab values.  Total Time spent coordinating discharge including counseling, education and face to face time equals greater than 30 minutes.  SignedBETHA Donalda Applebaum 02/27/2024 9:40 AM

## 2024-02-27 NOTE — TOC Transition Note (Signed)
 Transition of Care [facility]) - Discharge Note   Patient Details  Name: Terry [name] MRN: [id number] Date of Birth: 10-01-1945  [facility] CM/SW Contact:  Inocente GORMAN Kindle, LCSW Phone Number: 02/27/2024, 11:56 AM   Clinical Narrative:    Patient will DC to: [facility] Anticipated DC date: 02/27/24 Family notified: Spouse Transport by: [facility]   Per MD patient ready for DC to [facility]. RN to call report prior to discharge [phone] room 109). RN, patient, patient's family, and facility notified of DC. Discharge Summary and FL2 sent to facility. DC packet on chart. Ambulance transport requested for patient.   CSW will sign off for now as social work intervention is no longer needed. Please consult us  again if new needs arise.     Final next level of care: Skilled Nursing Facility Barriers to Discharge: Barriers Resolved   Patient Goals and CMS Choice Patient states their goals for this hospitalization and ongoing recovery are:: Rehab CMS Medicare.gov Compare Post Acute Care list provided to:: Patient Represenative (must comment) Choice offered to / list presented to : Spouse [place] ownership interest in [facility].provided to:: Spouse    Discharge Placement   Existing PASRR number confirmed : 02/27/24          Patient chooses bed at: [facility] Patient to be transferred to facility by: [facility] Name of family member notified: Spouse Patient and family notified of of transfer: 02/27/24  Discharge Plan and Services Additional resources added to the After Visit Summary for   In-house Referral: Clinical Social Work   Post Acute Care Choice: Skilled Nursing Facility                               Social Drivers of Health (SDOH) Interventions SDOH Screenings   Food Insecurity: Low Risk  (01/07/2024)   Received from [facility]  Housing: Low Risk  (01/07/2024)   Received from [facility]  Transportation Needs: No  Transportation Needs (01/07/2024)   Received from [facility]  Utilities: Low Risk  (01/07/2024)   Received from [facility]  Depression (PHQ2-9): Medium Risk (12/10/2019)  Social Connections: Unknown (10/11/2019)   Received from [facility]  Tobacco Use: Medium Risk (12/10/2023)     Readmission Risk Interventions     No data to display

## 2024-02-27 NOTE — Plan of Care (Signed)

## 2024-02-27 NOTE — Plan of Care (Signed)
  Problem: Education: Goal: Knowledge of General Education information will improve Description: Including pain rating scale, medication(s)/side effects and non-pharmacologic comfort measures Outcome: Progressing   Problem: Health Behavior/Discharge Planning: Goal: Ability to manage health-related needs will improve Outcome: Progressing   Problem: Clinical Measurements: Goal: Ability to maintain clinical measurements within normal limits will improve Outcome: Progressing Goal: Will remain free from infection Outcome: Progressing Goal: Diagnostic test results will improve Outcome: Progressing Goal: Respiratory complications will improve Outcome: Progressing Goal: Cardiovascular complication will be avoided Outcome: Progressing   Problem: Activity: Goal: Risk for activity intolerance will decrease Outcome: Progressing   Problem: Nutrition: Goal: Adequate nutrition will be maintained Outcome: Progressing   Problem: Coping: Goal: Level of anxiety will decrease Outcome: Progressing   Problem: Elimination: Goal: Will not experience complications related to bowel motility Outcome: Progressing Goal: Will not experience complications related to urinary retention Outcome: Progressing   Problem: Pain Managment: Goal: General experience of comfort will improve and/or be controlled Outcome: Progressing   Problem: Safety: Goal: Ability to remain free from injury will improve Outcome: Progressing   Problem: Skin Integrity: Goal: Risk for impaired skin integrity will decrease Outcome: Progressing   Problem: Safety: Goal: Non-violent Restraint(s) Outcome: Progressing   Problem: Activity: Goal: Ability to tolerate increased activity will improve Outcome: Progressing   Problem: Respiratory: Goal: Ability to maintain a clear airway and adequate ventilation will improve Outcome: Progressing   Problem: Role Relationship: Goal: Method of communication will improve Outcome:  Progressing

## 2024-03-11 ENCOUNTER — Observation Stay (HOSPITAL_COMMUNITY)
Admission: EM | Admit: 2024-03-11 | Discharge: 2024-03-14 | Disposition: A | Discharge status: Skilled Nursing Facility | Attending: Internal Medicine | Admitting: Internal Medicine

## 2024-03-11 ENCOUNTER — Observation Stay (HOSPITAL_COMMUNITY)

## 2024-03-11 ENCOUNTER — Other Ambulatory Visit: Payer: Self-pay

## 2024-03-11 DIAGNOSIS — G9340 Encephalopathy, unspecified: Secondary | ICD-10-CM | POA: Insufficient documentation

## 2024-03-11 DIAGNOSIS — K219 Gastro-esophageal reflux disease without esophagitis: Secondary | ICD-10-CM | POA: Diagnosis not present

## 2024-03-11 DIAGNOSIS — I10 Essential (primary) hypertension: Secondary | ICD-10-CM | POA: Insufficient documentation

## 2024-03-11 DIAGNOSIS — Z7982 Long term (current) use of aspirin: Secondary | ICD-10-CM | POA: Diagnosis not present

## 2024-03-11 DIAGNOSIS — G44229 Chronic tension-type headache, not intractable: Secondary | ICD-10-CM | POA: Diagnosis not present

## 2024-03-11 DIAGNOSIS — R1030 Lower abdominal pain, unspecified: Secondary | ICD-10-CM | POA: Diagnosis not present

## 2024-03-11 DIAGNOSIS — D649 Anemia, unspecified: Secondary | ICD-10-CM | POA: Diagnosis not present

## 2024-03-11 DIAGNOSIS — E785 Hyperlipidemia, unspecified: Secondary | ICD-10-CM | POA: Diagnosis not present

## 2024-03-11 DIAGNOSIS — G20A1 Parkinson's disease without dyskinesia, without mention of fluctuations: Secondary | ICD-10-CM | POA: Diagnosis not present

## 2024-03-11 DIAGNOSIS — Z79899 Other long term (current) drug therapy: Secondary | ICD-10-CM | POA: Insufficient documentation

## 2024-03-11 DIAGNOSIS — Z8679 Personal history of other diseases of the circulatory system: Secondary | ICD-10-CM | POA: Insufficient documentation

## 2024-03-11 DIAGNOSIS — F0280 Dementia in other diseases classified elsewhere without behavioral disturbance: Secondary | ICD-10-CM | POA: Diagnosis not present

## 2024-03-11 DIAGNOSIS — R410 Disorientation, unspecified: Principal | ICD-10-CM | POA: Insufficient documentation

## 2024-03-11 LAB — URINALYSIS, ROUTINE W REFLEX MICROSCOPIC
Bilirubin Urine: NEGATIVE
Glucose, UA: NEGATIVE mg/dL
Hgb urine dipstick: NEGATIVE
Ketones, ur: 5 mg/dL — AB
Leukocytes,Ua: NEGATIVE
Nitrite: NEGATIVE
Protein, ur: NEGATIVE mg/dL
Specific Gravity, Urine: 1.019 (ref 1.005–1.030)
pH: 5 (ref 5.0–8.0)

## 2024-03-11 LAB — RAPID URINE DRUG SCREEN, HOSP PERFORMED
Amphetamines: NOT DETECTED
Barbiturates: NOT DETECTED
Benzodiazepines: NOT DETECTED
Cocaine: NOT DETECTED
Opiates: NOT DETECTED
Tetrahydrocannabinol: NOT DETECTED

## 2024-03-11 LAB — COMPREHENSIVE METABOLIC PANEL WITH GFR
ALT: 5 U/L (ref 0–44)
AST: 23 U/L (ref 15–41)
Albumin: 3.2 g/dL — ABNORMAL LOW (ref 3.5–5.0)
Alkaline Phosphatase: 53 U/L (ref 38–126)
Anion gap: 10 (ref 5–15)
BUN: 14 mg/dL (ref 8–23)
CO2: 20 mmol/L — ABNORMAL LOW (ref 22–32)
Calcium: 8.2 mg/dL — ABNORMAL LOW (ref 8.9–10.3)
Chloride: 108 mmol/L (ref 98–111)
Creatinine, Ser: 0.79 mg/dL (ref 0.61–1.24)
GFR, Estimated: >60 mL/min (ref >60)
Glucose, Bld: 92 mg/dL (ref 70–99)
Potassium: 3.4 mmol/L — ABNORMAL LOW (ref 3.5–5.1)
Sodium: 138 mmol/L (ref 135–145)
Total Bilirubin: 0.7 mg/dL (ref 0.0–1.2)
Total Protein: 5.9 g/dL — ABNORMAL LOW (ref 6.5–8.1)

## 2024-03-11 LAB — CBC WITH DIFFERENTIAL/PLATELET
Abs Immature Granulocytes: 0.02 K/uL (ref 0.00–0.07)
Basophils Absolute: 0 K/uL (ref 0.0–0.1)
Basophils Relative: 0 %
Eosinophils Absolute: 0.1 K/uL (ref 0.0–0.5)
Eosinophils Relative: 1 %
HCT: 33.7 % — ABNORMAL LOW (ref 39.0–52.0)
Hemoglobin: 10.8 g/dL — ABNORMAL LOW (ref 13.0–17.0)
Immature Granulocytes: 0 %
Lymphocytes Relative: 30 %
Lymphs Abs: 2.5 K/uL (ref 0.7–4.0)
MCH: 28.6 pg (ref 26.0–34.0)
MCHC: 32 g/dL (ref 30.0–36.0)
MCV: 89.2 fL (ref 80.0–100.0)
Monocytes Absolute: 0.7 K/uL (ref 0.1–1.0)
Monocytes Relative: 9 %
Neutro Abs: 5 K/uL (ref 1.7–7.7)
Neutrophils Relative %: 60 %
Platelets: 253 K/uL (ref 150–400)
RBC: 3.78 MIL/uL — ABNORMAL LOW (ref 4.22–5.81)
RDW: 15 % (ref 11.5–15.5)
WBC: 8.4 K/uL (ref 4.0–10.5)
nRBC: 0 % (ref 0.0–0.2)

## 2024-03-11 LAB — CK: Total CK: 92 U/L (ref 49–397)

## 2024-03-11 LAB — TYPE AND SCREEN
ABO/RH(D): O POS
Antibody Screen: NEGATIVE

## 2024-03-11 LAB — I-STAT CG4 LACTIC ACID, ED: Lactic Acid, Venous: 1.3 mmol/L (ref 0.5–1.9)

## 2024-03-11 MED ORDER — DIAZEPAM 5 MG/ML IJ SOLN
2.5000 mg | Freq: Once | INTRAMUSCULAR | Status: AC | PRN
Start: 2024-03-11 — End: 2024-03-14
  Administered 2024-03-14: 2.5 mg via INTRAVENOUS
  Filled 2024-03-11: qty 2

## 2024-03-11 MED ORDER — CARBIDOPA-LEVODOPA 25-100 MG PO TABS
2.0000 | ORAL_TABLET | Freq: Three times a day (TID) | ORAL | Status: DC
  Administered 2024-03-11 – 2024-03-12 (×4): 2 via ORAL
  Filled 2024-03-11 (×4): qty 2

## 2024-03-11 MED ORDER — AMMONIA AROMATIC IN INHA
1.0000 | Freq: Once | RESPIRATORY_TRACT | Status: DC
  Filled 2024-03-11: qty 10

## 2024-03-11 MED ORDER — DIAZEPAM 5 MG/ML IJ SOLN
5.0000 mg | Freq: Once | INTRAMUSCULAR | Status: AC
  Administered 2024-03-11: 5 mg via INTRAVENOUS
  Filled 2024-03-11: qty 2

## 2024-03-11 MED ORDER — QUETIAPINE FUMARATE 25 MG PO TABS
25.0000 mg | ORAL_TABLET | Freq: Every day | ORAL | Status: DC
  Administered 2024-03-11: 25 mg via ORAL
  Filled 2024-03-11: qty 1

## 2024-03-11 MED ORDER — CARBIDOPA-LEVODOPA ER 50-200 MG PO TBCR
1.0000 | EXTENDED_RELEASE_TABLET | Freq: Every day | ORAL | Status: DC
  Administered 2024-03-12 – 2024-03-13 (×2): 1 via ORAL
  Filled 2024-03-11 (×3): qty 1

## 2024-03-11 MED ORDER — DIAZEPAM 5 MG/ML IJ SOLN
2.5000 mg | Freq: Once | INTRAMUSCULAR | Status: DC
  Filled 2024-03-11: qty 2

## 2024-03-11 MED ORDER — CARBIDOPA-LEVODOPA ER 87.5-350 MG PO CPCR: 2.0000 | ORAL_CAPSULE | Freq: Once | ORAL | Status: DC

## 2024-03-11 MED ORDER — DIAZEPAM 5 MG/ML IJ SOLN: 5.0000 mg | Freq: Once | INTRAMUSCULAR | Status: DC

## 2024-03-11 MED ORDER — QUETIAPINE FUMARATE 25 MG PO TABS: 25.0000 mg | ORAL_TABLET | Freq: Every day | ORAL | Status: DC

## 2024-03-11 NOTE — ED Provider Notes (Signed)
 [place] EMERGENCY DEPARTMENT AT [facility] Provider Note  CSN: [id number] Arrival date & time: 03/11/24 1719  Chief Complaint(s) erratic behavior   HPI Terry [name] is a 78 y.o. male with PMH Parkinson's disease, GERD, HTN, HLD, recent hospital admission on 02/19/2024 for agitation and delirium complicated by oversedation and intubation x 2 who presents emerged department for evaluation of erratic behavior.  Patient reportedly left his rehab facility yesterday AGAINST MEDICAL ADVICE and returned home.  He started to display erratic behavior while at home and EMS was called. He was found in his bathroom writhing around on the ground and very agitated.  EMS reviewed previous hospital admission and administered 50 of IV Benadryl  due to concern for extraparametal side effects with the desire to avoid excessive oversedation with benzodiazepines.  States that he did have an improvement in his behavior but patient does arrive very akathetic, rolling around in bed and appears very panicked.  This is an almost identical presentation to when I saw him on 02/19/2024 endorses some mild abdominal pain but denies nausea, vomiting.  He will have periods of lucidity and answer questions but quickly revert to rolling around in the bed with agitation.   Past Medical History Past Medical History:  Diagnosis Date   Carotid artery stenosis 04/29/2018   Chronic tension headaches 04/29/2018   Essential hypertension 04/29/2018   GERD (gastroesophageal reflux disease) 04/29/2018   HLD (hyperlipidemia) 04/29/2018   Parkinson's disease (HCC) 04/29/2018   Patient Active Problem List   Diagnosis Date Noted   Acute delirium 03/11/2024   AMS (altered mental status) 02/20/2024   Delirium 02/19/2024   Lower abdominal pain 11/11/2021   Irritable bowel syndrome with both constipation and diarrhea 11/11/2021   Change in bowel habits 11/11/2021   History of iron deficiency 11/11/2021   Constipation 09/09/2021    Gastroesophageal reflux disease 09/09/2021   Anemia 09/09/2021   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   Carotid artery occlusion 06/14/2021   Stroke (HCC) 04/24/2021   Headache 04/24/2021   Hypertensive crisis 04/23/2021   Chest tightness 10/09/2019   Shortness of breath 10/09/2019   Erectile dysfunction 03/12/2019   Essential hypertension 04/29/2018   Need for immunization against influenza 04/29/2018   Parkinson's disease (HCC) 04/29/2018   Carotid artery stenosis 04/29/2018   HLD (hyperlipidemia) 04/29/2018   GERD (gastroesophageal reflux disease) 04/29/2018   Colon cancer screening 04/29/2018   Chronic tension headaches 04/29/2018   Murmur, cardiac 02/12/2018   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 Authorizing Provider  atorvastatin  (LIPITOR) 20 MG tablet TAKE ONE TABLET BY MOUTH EVERY NIGHT AT BEDTIME Patient taking differently: Take 20 mg by mouth at bedtime. 05/19/20  Yes Bloomfield, Carley D, DO  baclofen  (LIORESAL ) 10 MG tablet Take 10 mg by mouth 3 (three) times daily as needed for muscle spasms. 02/19/24  Yes [provider]  carbidopa -levodopa  (SINEMET  CR) 50-200 MG tablet Take 1 tablet by mouth at bedtime. 07/29/18  Yes Lovey Satterfield, MD  Carbidopa -Levodopa  ER 70-280 MG CPCR Take 2 capsules by mouth 3 (three) times daily. At 0600 AM, 1100 AM, and 5PM.   Yes [provider]  hydrALAZINE  (APRESOLINE ) 50 MG tablet TAKE 1 TABLET(50 MG) BY MOUTH TWICE DAILY Patient taking differently: Take 50 mg by mouth in  the morning and at bedtime. 9AM and 9PM 11/06/23  Yes O'Neal, Darryle Ned, MD  meloxicam  (MOBIC ) 7.5 MG tablet Take 7.5 mg by mouth 2 (two) times daily as needed. 11/07/22  Yes [provider]   tiZANidine (ZANAFLEX) 2 MG tablet Take 2 mg by mouth daily as needed for muscle spasms. 03/10/24  Yes [provider]  traZODone  (DESYREL ) 50 MG tablet Take 1 tablet (50 mg total) by mouth at bedtime as needed for sleep. 09/03/18  Yes Lovey Satterfield, MD  Vitamin D, Ergocalciferol, (DRISDOL) 1.25 MG (50000 UNIT) CAPS capsule Take 50,000 Units by mouth every Tuesday. 01/02/24  Yes [provider]  acetaminophen  (TYLENOL ) 325 MG tablet Take 325 mg by mouth daily as needed for mild pain (pain score 1-3).    [provider]  aspirin  EC 81 MG EC tablet Take 1 tablet (81 mg total) by mouth daily. Swallow whole. 04/29/21   Vernon Ranks, MD  brimonidine -timolol  (COMBIGAN ) 0.2-0.5 % ophthalmic solution Apply 1 drop to eye 2 (two) times daily. 05/16/22   [provider]  Cyanocobalamin (VITAMIN B-12 IJ) Inject 1,000 mcg as directed every 30 (thirty) days.    [provider]  feeding supplement (ENSURE PLUS HIGH PROTEIN) LIQD Take 237 mLs by mouth 2 (two) times daily between meals. 02/27/24   Ghimire, Donalda HERO, MD  melatonin 5 MG TABS Take 1 tablet (5 mg total) by mouth at bedtime. 02/27/24   Ghimire, Donalda HERO, MD  olopatadine  (PATANOL) 0.1 % ophthalmic solution Place 1 drop into both eyes daily as needed for allergies.    [provider]  ondansetron  (ZOFRAN ) 4 MG tablet Take 4 mg by mouth every 8 (eight) hours as needed. 12/20/23   [provider]  pantoprazole  (PROTONIX ) 40 MG tablet TAKE 1 TABLET BY MOUTH EVERY DAY 09/27/22   Mansouraty, Aloha Raddle., MD  Prenatal Vit-Fe Fumarate-FA (M-NATAL PLUS) 27-1 MG TABS Take 1 tablet by mouth daily. 08/23/22   [provider]  QUEtiapine  (SEROQUEL ) 25 MG tablet Take 1 tablet (25 mg total) by mouth 2 (two) times daily as needed (agitation). 02/27/24   Ghimire, Donalda HERO, MD  Rotigotine  1 MG/24HR PT24 Place 2 patches (2 mg total) onto the skin daily. 02/27/24   Ghimire, Donalda HERO, MD  triamcinolone ointment  (KENALOG) 0.1 % Apply topically 3 (three) times daily. 02/11/24   [provider]                                                                                                                                    Past Surgical History Past Surgical History:  Procedure Laterality Date   BIOPSY  01/09/2022   Procedure: BIOPSY;  Surgeon: Wilhelmenia Aloha Raddle., MD;  Location: [facility];  Service: Gastroenterology;;  EGD and COLON   COLONOSCOPY WITH PROPOFOL  N/A 01/09/2022   Procedure: COLONOSCOPY WITH PROPOFOL ;  Surgeon: Wilhelmenia Aloha Raddle., MD;  Location: [facility];  Service: Gastroenterology;  Laterality: N/A;   ENDARTERECTOMY Left 04/27/2021   Procedure: LEFT CAROTID ENDARTERECTOMY;  Surgeon: Serene Gaile ORN, MD;  Location: [facility] OR;  Service: Vascular;  Laterality: Left;   ESOPHAGOGASTRODUODENOSCOPY (EGD) WITH PROPOFOL  N/A 01/09/2022   Procedure: ESOPHAGOGASTRODUODENOSCOPY (EGD) WITH PROPOFOL ;  Surgeon: Wilhelmenia Aloha Raddle., MD;  Location: [facility];  Service: Gastroenterology;  Laterality: N/A;   LEFT HEART CATH AND CORONARY ANGIOGRAPHY N/A 11/25/2019   Procedure: LEFT HEART CATH AND CORONARY ANGIOGRAPHY;  Surgeon: Claudene Victory ORN, MD;  Location: [facility];  Service: Cardiovascular;  Laterality: N/A;   PATCH ANGIOPLASTY Left 04/27/2021   Procedure: PATCH ANGIOPLASTY USING GEORGE BIOLOGIC PATCH;  Surgeon: Serene Gaile ORN, MD;  Location: [facility] OR;  Service: Vascular;  Laterality: Left;   TONSILLECTOMY     Family History Family History  Problem Relation Age of Onset   Heart disease Mother    Cancer Father    Parkinson's disease Sister    Colon cancer Neg Hx    Stomach cancer Neg Hx    Esophageal cancer Neg Hx    Inflammatory bowel disease Neg Hx    Liver disease Neg Hx    Pancreatic cancer Neg Hx    Rectal cancer Neg Hx     Social History Social History   Tobacco Use   Smoking status: Never    Passive exposure: Never   Smokeless tobacco: Former     Types: Snuff  Vaping Use   Vaping status: Never Used  Substance Use Topics   Alcohol use: Yes    Comment: Occasionally.   Drug use: Never   Allergies Erythromycin, Haldol  [haloperidol ], Penicillins, Azithromycin, Clam shell, Oyster extract, and Haloperidol  and related  Review of Systems Review of Systems  Gastrointestinal:  Positive for abdominal pain.  Psychiatric/Behavioral:  Positive for agitation.     Physical Exam Vital Signs  I have reviewed the triage vital signs BP (!) 178/88 (BP Location: Right Arm)   Pulse 80   Temp 98.3 F (36.8 C) (Oral)   Resp 15   Ht 6' (1.829 m)   Wt 71.7 kg   SpO2 100%   BMI 21.44 kg/m   Physical Exam Constitutional:      General: He is in acute distress.     Appearance: Normal appearance. He is ill-appearing.  HENT:     Head: Normocephalic and atraumatic.     Nose: No congestion or rhinorrhea.  Eyes:     General:        Right eye: No discharge.        Left eye: No discharge.     Extraocular Movements: Extraocular movements intact.     Pupils: Pupils are equal, round, and reactive to light.  Cardiovascular:     Rate and Rhythm: Regular rhythm. Tachycardia present.     Heart sounds: No murmur heard. Pulmonary:     Effort: No respiratory distress.     Breath sounds: No wheezing or rales.  Abdominal:     General: There is no distension.     Tenderness: There is no abdominal tenderness.  Musculoskeletal:        General: Normal range of motion.     Cervical back: Normal range of motion.  Skin:    General: Skin is warm and dry.  Neurological:     General: No focal deficit present.     Mental Status: He is alert. He is disoriented.     ED Results and Treatments Labs (all labs ordered are  listed, but only abnormal results are displayed) Labs Reviewed  COMPREHENSIVE METABOLIC PANEL WITH GFR - Abnormal; Notable for the following components:      Result Value   Potassium 3.4 (*)    CO2 20 (*)    Calcium  8.2 (*)    Total  Protein 5.9 (*)    Albumin 3.2 (*)    All other components within normal limits  CBC WITH DIFFERENTIAL/PLATELET - Abnormal; Notable for the following components:   RBC 3.78 (*)    Hemoglobin 10.8 (*)    HCT 33.7 (*)    All other components within normal limits  URINALYSIS, ROUTINE W REFLEX MICROSCOPIC - Abnormal; Notable for the following components:   Color, Urine AMBER (*)    APPearance HAZY (*)    Ketones, ur 5 (*)    All other components within normal limits  RAPID URINE DRUG SCREEN, HOSP PERFORMED  CK  CBC  CREATININE, SERUM  CBC  COMPREHENSIVE METABOLIC PANEL WITH GFR  I-STAT CG4 LACTIC ACID, ED  TYPE AND SCREEN                                                                                                                          Radiology CT ABDOMEN PELVIS WO CONTRAST Result Date: 03/11/2024 CLINICAL DATA:  Dark stool and abdominal pain EXAM: CT ABDOMEN AND PELVIS WITHOUT CONTRAST TECHNIQUE: Multidetector CT imaging of the abdomen and pelvis was performed following the standard protocol without IV contrast. RADIATION DOSE REDUCTION: This exam was performed according to the departmental dose-optimization program which includes automated exposure control, adjustment of the mA and/or kV according to patient size and/or use of iterative reconstruction technique. COMPARISON:  CT 02/19/2024 FINDINGS: Lower chest: No acute abnormality. Hepatobiliary: Unremarkable noncontrast appearance of the liver, gallbladder, and biliary tree. Pancreas: Unremarkable. Spleen: Unremarkable. Adrenals/Urinary Tract: Normal adrenal glands. No urinary calculi or hydronephrosis. Unremarkable bladder. Stomach/Bowel: Normal caliber large and small bowel. No bowel wall thickening. Normal appendix. Stomach is within normal limits. Vascular/Lymphatic: Aortic atherosclerosis. No enlarged abdominal or pelvic lymph nodes. Reproductive: Unremarkable. Other: No free intraperitoneal fluid or air. Musculoskeletal: No acute  fracture. IMPRESSION: 1. No acute abnormality in the abdomen or pelvis. 2. Aortic Atherosclerosis (ICD10-I70.0). Electronically Signed   By: Norman Gatlin M.D.   On: 03/11/2024 23:03    Pertinent labs & imaging results that were available during my care of the patient were reviewed by me and considered in my medical decision making (see MDM for details).  Medications Ordered in ED Medications  ammonia  inhalant 1 each (has no administration in time range)  QUEtiapine  (SEROQUEL ) tablet 25 mg (25 mg Oral Given 03/11/24 1943)  diazepam  (VALIUM ) injection 2.5 mg (has no administration in time range)  carbidopa -levodopa  (SINEMET  IR) 25-100 MG per tablet immediate release 2 tablet (2 tablets Oral Given 03/11/24 2138)  carbidopa -levodopa  (SINEMET  CR) 50-200 MG per tablet controlled release 1 tablet (has no administration in time range)  atorvastatin  (LIPITOR) tablet 20 mg (has no  administration in time range)  hydrALAZINE  (APRESOLINE ) tablet 50 mg (has no administration in time range)  enoxaparin  (LOVENOX ) injection 40 mg (has no administration in time range)  acetaminophen  (TYLENOL ) tablet 650 mg (has no administration in time range)    Or  acetaminophen  (TYLENOL ) suppository 650 mg (has no administration in time range)  brimonidine -timolol  (COMBIGAN ) 0.2-0.5 % ophthalmic solution 1 drop (has no administration in time range)  diazepam  (VALIUM ) injection 5 mg (5 mg Intravenous Given 03/11/24 1742)                                                                                                                                     Procedures Procedures  (including critical care time)  Medical Decision Making / ED Course   This patient presents to the ED for concern of agitation, this involves an extensive number of treatment options, and is a complaint that carries with it a high risk of complications and morbidity.  The differential diagnosis includes medication side effect, EPS, panic attack,  dopaminergic agent withdrawal.  Delirium  MDM: Patient seen emerged part for evaluation of erratic behavior.  Physical exam reveals a panicked patient rolling around in the bed with intermittent periods of lucidity but moving all 4 extremities, minimal tenderness to palpation in the abdomen.  Laboratory evaluation with a hemoglobin of 10.8, potassium 3.4 albumin 3.2 but is otherwise unremarkable.  Lactic acid and CK are normal.  Urinalysis UDS unremarkable.  Patient given an initial dose of Valium  which did lead to some significant sedation and respiratory depression but with verbal stimulation he would awaken and breathe spontaneously.  This medication then wore off and he continued this agitated behavior.  I did speak with the neurologist on-call who will come and evaluate the patient.  It appears that he had significant improvement of his behaviors with Seroquel  and thus this was administered and on reevaluation he is having improvement.  Patient has not returned to normal mental status baseline and will require hospital admission for erratic behavior and delirium.  Patient admitted.   Additional history obtained:  -External records from outside source obtained and reviewed including: Chart review including previous notes, labs, imaging, consultation notes   Lab Tests: -I ordered, reviewed, and interpreted labs.   The pertinent results include:   Labs Reviewed  COMPREHENSIVE METABOLIC PANEL WITH GFR - Abnormal; Notable for the following components:      Result Value   Potassium 3.4 (*)    CO2 20 (*)    Calcium  8.2 (*)    Total Protein 5.9 (*)    Albumin 3.2 (*)    All other components within normal limits  CBC WITH DIFFERENTIAL/PLATELET - Abnormal; Notable for the following components:   RBC 3.78 (*)    Hemoglobin 10.8 (*)    HCT 33.7 (*)    All other components within normal limits  URINALYSIS, ROUTINE W REFLEX MICROSCOPIC - Abnormal; Notable  for the following components:   Color,  Urine AMBER (*)    APPearance HAZY (*)    Ketones, ur 5 (*)    All other components within normal limits  RAPID URINE DRUG SCREEN, HOSP PERFORMED  CK  CBC  CREATININE, SERUM  CBC  COMPREHENSIVE METABOLIC PANEL WITH GFR  I-STAT CG4 LACTIC ACID, ED  TYPE AND SCREEN      EKG   EKG Interpretation Date/Time:  Tuesday March 11 2024 17:48:26 EDT Ventricular Rate:  82 PR Interval:  229 QRS Duration:  100 QT Interval:  393 QTC Calculation: 459 R Axis:   65  Text Interpretation: Sinus rhythm Prolonged PR interval Confirmed by Unique Searfoss ([id number]) on 03/11/2024 8:03:46 PM           Medicines ordered and prescription drug management: Meds ordered this encounter  Medications   diazepam  (VALIUM ) injection 5 mg   DISCONTD: QUEtiapine  (SEROQUEL ) tablet 25 mg   ammonia  inhalant 1 each   QUEtiapine  (SEROQUEL ) tablet 25 mg   DISCONTD: Carbidopa -Levodopa  ER 87.5-350 MG CPCR 2 capsule   DISCONTD: diazepam  (VALIUM ) injection 5 mg   DISCONTD: diazepam  (VALIUM ) injection 2.5 mg   diazepam  (VALIUM ) injection 2.5 mg   carbidopa -levodopa  (SINEMET  IR) 25-100 MG per tablet immediate release 2 tablet   carbidopa -levodopa  (SINEMET  CR) 50-200 MG per tablet controlled release 1 tablet   atorvastatin  (LIPITOR) tablet 20 mg   hydrALAZINE  (APRESOLINE ) tablet 50 mg    Patient taking differently: 9AM and 9PM     enoxaparin  (LOVENOX ) injection 40 mg   OR Linked Order Group    acetaminophen  (TYLENOL ) tablet 650 mg    acetaminophen  (TYLENOL ) suppository 650 mg   brimonidine -timolol  (COMBIGAN ) 0.2-0.5 % ophthalmic solution 1 drop    -I have reviewed the patients home medicines and have made adjustments as needed  Critical interventions none  Consultations Obtained: I requested consultation with the neurologist on-call,  and discussed lab and imaging findings as well as pertinent plan - they recommend: Recommendations pending   Cardiac Monitoring: The patient was maintained on a cardiac  monitor.  I personally viewed and interpreted the cardiac monitored which showed an underlying rhythm of: NSR  Social Determinants of Health:  Factors impacting patients care include: Recently left rehab AGAINST MEDICAL ADVICE   Reevaluation: After the interventions noted above, I reevaluated the patient and found that they have :improved  Co morbidities that complicate the patient evaluation  Past Medical History:  Diagnosis Date   Carotid artery stenosis 04/29/2018   Chronic tension headaches 04/29/2018   Essential hypertension 04/29/2018   GERD (gastroesophageal reflux disease) 04/29/2018   HLD (hyperlipidemia) 04/29/2018   Parkinson's disease (HCC) 04/29/2018      Dispostion: I considered admission for this patient, and patient will require hospital mission for delirium     Final Clinical Impression(s) / ED Diagnoses Final diagnoses:  Delirium     @PCDICTATION @    Albertina Dixon, MD 03/12/24 [phone]

## 2024-03-11 NOTE — ED Notes (Addendum)
 1st lac was 1.3 within normal range, 2nd not needed can be discontinued with DR okay

## 2024-03-11 NOTE — ED Triage Notes (Signed)
 Pt bib Terry [name] from [facility] for abd pain. Pt has erratic behavior and body movements he is unable to control. Pt reports dark stool and has dried blood on lips. Feels like he is dying. Hx of parkinsons

## 2024-03-12 ENCOUNTER — Encounter (HOSPITAL_COMMUNITY): Payer: Self-pay | Admitting: Internal Medicine

## 2024-03-12 DIAGNOSIS — G249 Dystonia, unspecified: Secondary | ICD-10-CM

## 2024-03-12 DIAGNOSIS — G259 Extrapyramidal and movement disorder, unspecified: Secondary | ICD-10-CM

## 2024-03-12 DIAGNOSIS — G20A1 Parkinson's disease without dyskinesia, without mention of fluctuations: Secondary | ICD-10-CM

## 2024-03-12 DIAGNOSIS — I10 Essential (primary) hypertension: Secondary | ICD-10-CM

## 2024-03-12 DIAGNOSIS — G20C Parkinsonism, unspecified: Secondary | ICD-10-CM

## 2024-03-12 DIAGNOSIS — R410 Disorientation, unspecified: Principal | ICD-10-CM

## 2024-03-12 DIAGNOSIS — Z8659 Personal history of other mental and behavioral disorders: Secondary | ICD-10-CM | POA: Diagnosis not present

## 2024-03-12 LAB — COMPREHENSIVE METABOLIC PANEL WITH GFR
ALT: <5 U/L (ref 0–44)
AST: 17 U/L (ref 15–41)
Albumin: 3.2 g/dL — ABNORMAL LOW (ref 3.5–5.0)
Alkaline Phosphatase: 51 U/L (ref 38–126)
Anion gap: 8 (ref 5–15)
BUN: 11 mg/dL (ref 8–23)
CO2: 24 mmol/L (ref 22–32)
Calcium: 8.4 mg/dL — ABNORMAL LOW (ref 8.9–10.3)
Chloride: 106 mmol/L (ref 98–111)
Creatinine, Ser: 0.75 mg/dL (ref 0.61–1.24)
GFR, Estimated: >60 mL/min (ref >60)
Glucose, Bld: 98 mg/dL (ref 70–99)
Potassium: 3.8 mmol/L (ref 3.5–5.1)
Sodium: 138 mmol/L (ref 135–145)
Total Bilirubin: 0.7 mg/dL (ref 0.0–1.2)
Total Protein: 6 g/dL — ABNORMAL LOW (ref 6.5–8.1)

## 2024-03-12 LAB — CBC
HCT: 32.6 % — ABNORMAL LOW (ref 39.0–52.0)
Hemoglobin: 10.6 g/dL — ABNORMAL LOW (ref 13.0–17.0)
MCH: 28.7 pg (ref 26.0–34.0)
MCHC: 32.5 g/dL (ref 30.0–36.0)
MCV: 88.3 fL (ref 80.0–100.0)
Platelets: 260 K/uL (ref 150–400)
RBC: 3.69 MIL/uL — ABNORMAL LOW (ref 4.22–5.81)
RDW: 14.9 % (ref 11.5–15.5)
WBC: 7.7 K/uL (ref 4.0–10.5)
nRBC: 0 % (ref 0.0–0.2)

## 2024-03-12 MED ORDER — TIMOLOL MALEATE 0.5 % OP SOLN
1.0000 [drp] | Freq: Two times a day (BID) | OPHTHALMIC | Status: DC
  Administered 2024-03-12 – 2024-03-14 (×6): 1 [drp] via OPHTHALMIC
  Filled 2024-03-12: qty 5

## 2024-03-12 MED ORDER — BRIMONIDINE TARTRATE 0.2 % OP SOLN
1.0000 [drp] | Freq: Two times a day (BID) | OPHTHALMIC | Status: DC
  Administered 2024-03-12 – 2024-03-14 (×6): 1 [drp] via OPHTHALMIC
  Filled 2024-03-12: qty 5

## 2024-03-12 MED ORDER — CARBIDOPA-LEVODOPA 25-100 MG PO TABS
2.0000 | ORAL_TABLET | ORAL | Status: DC
  Administered 2024-03-12 – 2024-03-14 (×8): 2 via ORAL
  Filled 2024-03-12 (×8): qty 2

## 2024-03-12 MED ORDER — CARBIDOPA-LEVODOPA 25-100 MG PO TABS: 2.0000 | ORAL_TABLET | Freq: Three times a day (TID) | ORAL | Status: DC

## 2024-03-12 MED ORDER — POLYETHYLENE GLYCOL 3350 17 G PO PACK
17.0000 g | PACK | Freq: Two times a day (BID) | ORAL | Status: DC
  Administered 2024-03-12 – 2024-03-14 (×4): 17 g via ORAL
  Filled 2024-03-12 (×4): qty 1

## 2024-03-12 MED ORDER — ATORVASTATIN CALCIUM 10 MG PO TABS
20.0000 mg | ORAL_TABLET | Freq: Every day | ORAL | Status: DC
  Administered 2024-03-12 – 2024-03-13 (×2): 20 mg via ORAL
  Filled 2024-03-12 (×2): qty 2

## 2024-03-12 MED ORDER — ROTIGOTINE 2 MG/24HR TD PT24: 1.0000 | MEDICATED_PATCH | Freq: Every day | TRANSDERMAL | Status: DC

## 2024-03-12 MED ORDER — HYDRALAZINE HCL 25 MG PO TABS
50.0000 mg | ORAL_TABLET | Freq: Two times a day (BID) | ORAL | Status: DC
  Administered 2024-03-12 (×2): 50 mg via ORAL
  Filled 2024-03-12 (×2): qty 2

## 2024-03-12 MED ORDER — QUETIAPINE FUMARATE 25 MG PO TABS: 25.0000 mg | ORAL_TABLET | Freq: Every evening | ORAL | Status: DC | PRN

## 2024-03-12 MED ORDER — BRIMONIDINE TARTRATE-TIMOLOL 0.2-0.5 % OP SOLN: 1.0000 [drp] | Freq: Two times a day (BID) | OPHTHALMIC | Status: DC

## 2024-03-12 MED ORDER — ENOXAPARIN SODIUM 40 MG/0.4ML IJ SOSY
40.0000 mg | PREFILLED_SYRINGE | INTRAMUSCULAR | Status: DC
  Administered 2024-03-12 – 2024-03-14 (×3): 40 mg via SUBCUTANEOUS
  Filled 2024-03-12 (×3): qty 0.4

## 2024-03-12 MED ORDER — HYDRALAZINE HCL 25 MG PO TABS: 50.0000 mg | ORAL_TABLET | Freq: Two times a day (BID) | ORAL | Status: DC

## 2024-03-12 MED ORDER — ACETAMINOPHEN 650 MG RE SUPP
650.0000 mg | Freq: Four times a day (QID) | RECTAL | Status: DC | PRN
Start: 2024-03-12 — End: 2024-03-14

## 2024-03-12 MED ORDER — ACETAMINOPHEN 325 MG PO TABS
650.0000 mg | ORAL_TABLET | Freq: Four times a day (QID) | ORAL | Status: DC | PRN
  Administered 2024-03-12: 650 mg via ORAL
  Filled 2024-03-12: qty 2

## 2024-03-12 NOTE — H&P (Addendum)
 History and Physical    Terry [name] FMW:[id number] DOB: March 05, 1946 DOA: 03/11/2024  Chief Complaint: Restlessness confusion and abdominal discomfort.  HPI: Terry [name] is a 78 y.o. male with history of Parkinson's disease, carotid stenosis, hypertension who was recently admitted on 02/19/2024 for acute delirium and patient received Valium /Haldol /Seroquel  with improvement but later on became altered hypoxic bradycardic and was intubated and admitted to ICU subsequently extubated but developed respiratory arrest following administration of Haldol  was reintubated.  Patient also prior to last admission had received low back steroid injection for chronic pain.  Patient was eventually discharged to rehab which patient left 2 days ago and went back to his apartment.  Patient presents to the ER with complaints of abdominal discomfort and also was found to be restless and confused.  Patient states he has been taking his medications as prescribed and has not missed his dose.  ED Course: In the ER CT abdomen is unremarkable.  Patient is afebrile.  Labs show AST ALT of 23 and 5 bilirubin of 0.7 CK 92 lactic acid 1.3 WBC 8.4 hemoglobin 10.8.  UA is unremarkable drug screen is negative.  Patient was found to be restless in the ER was given a total of 7.5 mg of IV diazepam  and also 25 mg of Seroquel  and the nighttime dose of Sinemet .  At the time of my exam patient appears restless but oriented to time place and person.  Moving all extremities.  Neurologist was consulted.  Admitted for further observation.  Review of Systems: As per HPI, rest all negative.   Past Medical History:  Diagnosis Date   Carotid artery stenosis 04/29/2018   Chronic tension headaches 04/29/2018   Essential hypertension 04/29/2018   GERD (gastroesophageal reflux disease) 04/29/2018   HLD (hyperlipidemia) 04/29/2018   Parkinson's disease (HCC) 04/29/2018    Past Surgical History:  Procedure Laterality Date   BIOPSY  01/09/2022    Procedure: BIOPSY;  Surgeon: Wilhelmenia Aloha Raddle., MD;  Location: [facility];  Service: Gastroenterology;;  EGD and COLON   COLONOSCOPY WITH PROPOFOL  N/A 01/09/2022   Procedure: COLONOSCOPY WITH PROPOFOL ;  Surgeon: Wilhelmenia Aloha Raddle., MD;  Location: [facility];  Service: Gastroenterology;  Laterality: N/A;   ENDARTERECTOMY Left 04/27/2021   Procedure: LEFT CAROTID ENDARTERECTOMY;  Surgeon: Serene Gaile ORN, MD;  Location: [facility] OR;  Service: Vascular;  Laterality: Left;   ESOPHAGOGASTRODUODENOSCOPY (EGD) WITH PROPOFOL  N/A 01/09/2022   Procedure: ESOPHAGOGASTRODUODENOSCOPY (EGD) WITH PROPOFOL ;  Surgeon: Wilhelmenia Aloha Raddle., MD;  Location: [facility];  Service: Gastroenterology;  Laterality: N/A;   LEFT HEART CATH AND CORONARY ANGIOGRAPHY N/A 11/25/2019   Procedure: LEFT HEART CATH AND CORONARY ANGIOGRAPHY;  Surgeon: Claudene Victory ORN, MD;  Location: [facility];  Service: Cardiovascular;  Laterality: N/A;   PATCH ANGIOPLASTY Left 04/27/2021   Procedure: PATCH ANGIOPLASTY USING GEORGE BIOLOGIC PATCH;  Surgeon: Serene Gaile ORN, MD;  Location: [facility];  Service: Vascular;  Laterality: Left;   TONSILLECTOMY       reports that he has never smoked. He has never been exposed to tobacco smoke. He has quit using smokeless tobacco.  His smokeless tobacco use included snuff. He reports current alcohol use. He reports that he does not use drugs.  Allergies  Allergen Reactions   Erythromycin Rash, Dermatitis and Other (See Comments)    Other Reaction(s): Unknown  Stomach Cramps  erythromycin   Haldol  [Haloperidol ]     Haldol  caused bradycardia and respiratory arrest   Penicillins Rash, Dermatitis, Hives and Other (See Comments)  Other Reaction(s): Unknown  Product containing penicillin (product)   Azithromycin Other (See Comments)    GI upset  azithromycin   Clam Shell Nausea And Vomiting   Oyster Extract Nausea And Vomiting and Other (See Comments)    clam allergenic extract    Haloperidol  And Related Other (See Comments)    Respiratory arrest requiring intubation twice.     Family History  Problem Relation Age of Onset   Heart disease Mother    Cancer Father    Parkinson's disease Sister    Colon cancer Neg Hx    Stomach cancer Neg Hx    Esophageal cancer Neg Hx    Inflammatory bowel disease Neg Hx    Liver disease Neg Hx    Pancreatic cancer Neg Hx    Rectal cancer Neg Hx     Prior to Admission medications   Medication Sig Start Date End Date Taking? Authorizing Provider  atorvastatin  (LIPITOR) 20 MG tablet TAKE ONE TABLET BY MOUTH EVERY NIGHT AT BEDTIME Patient taking differently: Take 20 mg by mouth at bedtime. 05/19/20  Yes Bloomfield, Carley D, DO  baclofen  (LIORESAL ) 10 MG tablet Take 10 mg by mouth 3 (three) times daily as needed for muscle spasms. 02/19/24  Yes [provider]  carbidopa -levodopa  (SINEMET  CR) 50-200 MG tablet Take 1 tablet by mouth at bedtime. 07/29/18  Yes Lovey Satterfield, MD  Carbidopa -Levodopa  ER 70-280 MG CPCR Take 2 capsules by mouth 3 (three) times daily. At 0600 AM, 1100 AM, and 5PM.   Yes [provider]  hydrALAZINE  (APRESOLINE ) 50 MG tablet TAKE 1 TABLET(50 MG) BY MOUTH TWICE DAILY Patient taking differently: Take 50 mg by mouth in the morning and at bedtime. 9AM and 9PM 11/06/23  Yes O'Neal, Darryle Ned, MD  meloxicam  (MOBIC ) 7.5 MG tablet Take 7.5 mg by mouth 2 (two) times daily as needed. 11/07/22  Yes [provider]  tiZANidine (ZANAFLEX) 2 MG tablet Take 2 mg by mouth daily as needed for muscle spasms. 03/10/24  Yes [provider]  traZODone  (DESYREL ) 50 MG tablet Take 1 tablet (50 mg total) by mouth at bedtime as needed for sleep. 09/03/18  Yes Lovey Satterfield, MD  Vitamin D, Ergocalciferol, (DRISDOL) 1.25 MG (50000 UNIT) CAPS capsule Take 50,000 Units by mouth every Tuesday. 01/02/24  Yes [provider]  acetaminophen  (TYLENOL ) 325 MG tablet Take 325 mg by mouth daily  as needed for mild pain (pain score 1-3).    [provider]  aspirin  EC 81 MG EC tablet Take 1 tablet (81 mg total) by mouth daily. Swallow whole. 04/29/21   Vernon Ranks, MD  brimonidine -timolol  (COMBIGAN ) 0.2-0.5 % ophthalmic solution Apply 1 drop to eye 2 (two) times daily. 05/16/22   [provider]  Cyanocobalamin (VITAMIN B-12 IJ) Inject 1,000 mcg as directed every 30 (thirty) days.    [provider]  feeding supplement (ENSURE PLUS HIGH PROTEIN) LIQD Take 237 mLs by mouth 2 (two) times daily between meals. 02/27/24   Ghimire, Donalda HERO, MD  melatonin 5 MG TABS Take 1 tablet (5 mg total) by mouth at bedtime. 02/27/24   Ghimire, Donalda HERO, MD  olopatadine  (PATANOL) 0.1 % ophthalmic solution Place 1 drop into both eyes daily as needed for allergies.    [provider]  ondansetron  (ZOFRAN ) 4 MG tablet Take 4 mg by mouth every 8 (eight) hours as needed. 12/20/23   [provider]  pantoprazole  (PROTONIX ) 40 MG tablet TAKE 1 TABLET BY MOUTH EVERY DAY  09/27/22   Mansouraty, Aloha Raddle., MD  Prenatal Vit-Fe Fumarate-FA (M-NATAL PLUS) 27-1 MG TABS Take 1 tablet by mouth daily. 08/23/22   [provider]  QUEtiapine  (SEROQUEL ) 25 MG tablet Take 1 tablet (25 mg total) by mouth 2 (two) times daily as needed (agitation). 02/27/24   Ghimire, Donalda HERO, MD  Rotigotine  1 MG/24HR PT24 Place 2 patches (2 mg total) onto the skin daily. 02/27/24   Ghimire, Donalda HERO, MD  triamcinolone ointment (KENALOG) 0.1 % Apply topically 3 (three) times daily. 02/11/24   [provider]    Physical Exam: Constitutional: Moderately built and nourished. Vitals:   03/11/24 1726 03/11/24 1727 03/11/24 2151  BP:   (!) 178/88  Pulse:  88 80  Resp:  20 15  Temp:  (!) 97.5 F (36.4 C) 98.3 F (36.8 C)  TempSrc:  Axillary Oral  SpO2: 98% 100% 100%  Weight:  71.7 kg   Height:  6' (1.829 m)    Eyes: Anicteric no pallor. ENMT: No discharge from the ears eyes nose or  mouth. Neck: No mass felt.  No neck rigidity. Respiratory: No rhonchi or crepitations. Cardiovascular: S1-S2 heard. Abdomen: Soft nontender bowel sound present. Musculoskeletal: No edema. Skin: No rash. Neurologic: Alert awake oriented to time place and person appears restless.  Has some dyskinesia like facial movements.  Moving all extremities 5 x 5. Psychiatric: Oriented to time place and person.   Labs on Admission: I have personally reviewed following labs and imaging studies  CBC: Recent Labs  Lab 03/11/24 1753  WBC 8.4  NEUTROABS 5.0  HGB 10.8*  HCT 33.7*  MCV 89.2  PLT 253   Basic Metabolic Panel: Recent Labs  Lab 03/11/24 1753  NA 138  K 3.4*  CL 108  CO2 20*  GLUCOSE 92  BUN 14  CREATININE 0.79  CALCIUM  8.2*   GFR: Estimated Creatinine Clearance: 78.4 mL/min (by C-G formula based on SCr of 0.79 mg/dL). Liver Function Tests: Recent Labs  Lab 03/11/24 1753  AST 23  ALT 5  ALKPHOS 53  BILITOT 0.7  PROT 5.9*  ALBUMIN 3.2*   No results for input(s): LIPASE, AMYLASE in the last 168 hours. No results for input(s): AMMONIA  in the last 168 hours. Coagulation Profile: No results for input(s): INR, PROTIME in the last 168 hours. Cardiac Enzymes: Recent Labs  Lab 03/11/24 1753  CKTOTAL 92   BNP (last 3 results) No results for input(s): PROBNP in the last 8760 hours. HbA1C: No results for input(s): HGBA1C in the last 72 hours. CBG: No results for input(s): GLUCAP in the last 168 hours. Lipid Profile: No results for input(s): CHOL, HDL, LDLCALC, TRIG, CHOLHDL, LDLDIRECT in the last 72 hours. Thyroid  Function Tests: No results for input(s): TSH, T4TOTAL, FREET4, T3FREE, THYROIDAB in the last 72 hours. Anemia Panel: No results for input(s): VITAMINB12, FOLATE, FERRITIN, TIBC, IRON, RETICCTPCT in the last 72 hours. Urine analysis:    Component Value Date/Time   COLORURINE AMBER (A) 03/11/2024 1829    APPEARANCEUR HAZY (A) 03/11/2024 1829   LABSPEC 1.019 03/11/2024 1829   PHURINE 5.0 03/11/2024 1829   GLUCOSEU NEGATIVE 03/11/2024 1829   HGBUR NEGATIVE 03/11/2024 1829   BILIRUBINUR NEGATIVE 03/11/2024 1829   KETONESUR 5 (A) 03/11/2024 1829   PROTEINUR NEGATIVE 03/11/2024 1829   NITRITE NEGATIVE 03/11/2024 1829   LEUKOCYTESUR NEGATIVE 03/11/2024 1829   Sepsis Labs: @LABRCNTIP (procalcitonin:4,lacticidven:4) )No results found for this or any previous visit (from the past 240 hours).   Radiological Exams on  Admission: CT ABDOMEN PELVIS WO CONTRAST Result Date: 03/11/2024 CLINICAL DATA:  Dark stool and abdominal pain EXAM: CT ABDOMEN AND PELVIS WITHOUT CONTRAST TECHNIQUE: Multidetector CT imaging of the abdomen and pelvis was performed following the standard protocol without IV contrast. RADIATION DOSE REDUCTION: This exam was performed according to the departmental dose-optimization program which includes automated exposure control, adjustment of the mA and/or kV according to patient size and/or use of iterative reconstruction technique. COMPARISON:  CT 02/19/2024 FINDINGS: Lower chest: No acute abnormality. Hepatobiliary: Unremarkable noncontrast appearance of the liver, gallbladder, and biliary tree. Pancreas: Unremarkable. Spleen: Unremarkable. Adrenals/Urinary Tract: Normal adrenal glands. No urinary calculi or hydronephrosis. Unremarkable bladder. Stomach/Bowel: Normal caliber large and small bowel. No bowel wall thickening. Normal appendix. Stomach is within normal limits. Vascular/Lymphatic: Aortic atherosclerosis. No enlarged abdominal or pelvic lymph nodes. Reproductive: Unremarkable. Other: No free intraperitoneal fluid or air. Musculoskeletal: No acute fracture. IMPRESSION: 1. No acute abnormality in the abdomen or pelvis. 2. Aortic Atherosclerosis (ICD10-I70.0). Electronically Signed   By: Norman Gatlin M.D.   On: 03/11/2024 23:03    EKG: Independently reviewed.  Normal sinus  rhythm.  Assessment/Plan Principal Problem:   Acute delirium Active Problems:   Essential hypertension   Parkinson's disease (HCC)   HLD (hyperlipidemia)   GERD (gastroesophageal reflux disease)   Chronic tension headaches   Anemia   Lower abdominal pain    Restlessness/acute delirium -    on arrival patient was appearing confused and restless per ER physician and ER physician had to give at least 7.5 mg of IV diazepam  and also 25 mg of Seroquel  and his nighttime dose of carbidopa /levodopa  following which patient has become more calm and oriented.  Neurology has been consulted.  Patient is presently afebrile.  No definite signs of any infection.  Will continue patient's Parkinson's medications including carbidopa /levodopa  and rotigotine .  Patient also on Seroquel .  Discussed with pharmacy. Abdominal pain cause not clear CT abdomen pelvis unremarkable.  Labs are largely unremarkable.  Will continue to monitor. Parkinson's disease see #1.  Discussed with pharmacy about medication doses. Hypertension uncontrolled nighttime dose of hydralazine  due.  Will closely monitor blood pressure trends. Chronic anemia follow CBC.  Check anemia panel with next blood draw. History of carotid stenosis on statins. Recent admission for delirium required intubation twice.  See HPI.  Since patient has acute delirium/restlessness will need close monitoring further workup and more than 2 midnight stay.   DVT prophylaxis: Lovenox . Code Status: Full code. Family Communication: Discussed with patient. Disposition Plan: Medical floor. Consults called: Neurology. Admission status: Observation.

## 2024-03-12 NOTE — Plan of Care (Signed)

## 2024-03-12 NOTE — Consult Note (Signed)
 NEUROLOGY CONSULT NOTE   Date of service: March 12, 2024 Patient Name: Terry [name] MRN:  [id number] DOB:  10/23/1945 Chief Complaint: Erratic behavior, dyskinesia Requesting Provider: Franky Redia SAILOR, MD  History of Present Illness  Terry [name] is a 78 y.o. male with hx of Parkinson Disease who presents for erratic behavior and uncontrollable body movements.   Patient recently admitted 02/19/24 for similar presentation. At that time, received haldol , valium , and seroquel  and ultimately required intubation. Seroquel  initiated nightly and patient's delirium resolved. Seroquel  was therefore decreased to PRN. He was discharged on 8/6 to rehab however left AMA. While at home, displayed erratic behavior (EMS saw him on the ground writhing and very agitated).   While in the ED, received Seroquel . Currently is more calm and able to give history. He reports that he would like to go home to be with his wife. Does endorse having uncontrolled movements at times but says that is only if he does not take his Sinemet  regularly. Reports that it worsened when his medication was decreased from his regimen that was prescribed by his doctor in [place]. It appears 11/26/23 was seen by [facility] who in attempt to decrease dyskinesia transitioned from his Sinemet  q3h dosing to Crexont /extended release TID dosing with Sinemet  CR at night. Then somehow his regimen was switched back to Sinemet  q3h dosing (details below). This latter regimen was maintained during his previous hospitalization. Upon discharge, he was then resumed on his Crexont  regimen.  Q3h regimen (as per Dr. Sherren in [place], last seen 10/09/23):  Carbidopa -levodopa  CR 50-200 at 9pm Carbidopa -levodopa  IR 25-100 2 tablets at 0900, 1500, 1800 Carbidopa -levodopa  IR 25-100 3 tablets at 0600, 1200, 2100  Crexont  regimen (as per Dr. Merilee at [facility])  Prior medications: - Nuplazid/pimavanserin was discontinued due to fear it was causing his  orthostatic hypotension (?) - Clonazepam: for insomnia, reportedly had side effect - Mirtazapine : for insomnia, reportedly had side effect    ROS  Comprehensive ROS performed and pertinent positives documented in HPI    Past History   Past Medical History:  Diagnosis Date   Carotid artery stenosis 04/29/2018   Chronic tension headaches 04/29/2018   Essential hypertension 04/29/2018   GERD (gastroesophageal reflux disease) 04/29/2018   HLD (hyperlipidemia) 04/29/2018   Parkinson's disease (HCC) 04/29/2018    Past Surgical History:  Procedure Laterality Date   BIOPSY  01/09/2022   Procedure: BIOPSY;  Surgeon: Wilhelmenia Aloha Raddle., MD;  Location: [facility];  Service: Gastroenterology;;  EGD and COLON   COLONOSCOPY WITH PROPOFOL  N/A 01/09/2022   Procedure: COLONOSCOPY WITH PROPOFOL ;  Surgeon: Wilhelmenia Aloha Raddle., MD;  Location: [facility];  Service: Gastroenterology;  Laterality: N/A;   ENDARTERECTOMY Left 04/27/2021   Procedure: LEFT CAROTID ENDARTERECTOMY;  Surgeon: Serene Gaile ORN, MD;  Location: [facility] OR;  Service: Vascular;  Laterality: Left;   ESOPHAGOGASTRODUODENOSCOPY (EGD) WITH PROPOFOL  N/A 01/09/2022   Procedure: ESOPHAGOGASTRODUODENOSCOPY (EGD) WITH PROPOFOL ;  Surgeon: Wilhelmenia Aloha Raddle., MD;  Location: [facility];  Service: Gastroenterology;  Laterality: N/A;   LEFT HEART CATH AND CORONARY ANGIOGRAPHY N/A 11/25/2019   Procedure: LEFT HEART CATH AND CORONARY ANGIOGRAPHY;  Surgeon: Claudene Victory ORN, MD;  Location: [facility];  Service: Cardiovascular;  Laterality: N/A;   PATCH ANGIOPLASTY Left 04/27/2021   Procedure: PATCH ANGIOPLASTY USING GEORGE BIOLOGIC PATCH;  Surgeon: Serene Gaile ORN, MD;  Location: [facility];  Service: Vascular;  Laterality: Left;   TONSILLECTOMY      Family History: Family History  Problem Relation Age of  Onset   Heart disease Mother    Cancer Father    Parkinson's disease Sister    Colon cancer Neg Hx    Stomach cancer Neg Hx     Esophageal cancer Neg Hx    Inflammatory bowel disease Neg Hx    Liver disease Neg Hx    Pancreatic cancer Neg Hx    Rectal cancer Neg Hx     Social History  reports that he has never smoked. He has never been exposed to tobacco smoke. He has quit using smokeless tobacco.  His smokeless tobacco use included snuff. He reports current alcohol use. He reports that he does not use drugs.  Allergies  Allergen Reactions   Erythromycin Rash, Dermatitis and Other (See Comments)    Other Reaction(s): Unknown  Stomach Cramps  erythromycin   Haldol  [Haloperidol ]     Haldol  caused bradycardia and respiratory arrest   Penicillins Rash, Dermatitis, Hives and Other (See Comments)    Other Reaction(s): Unknown  Product containing penicillin (product)   Azithromycin Other (See Comments)    GI upset  azithromycin   Clam Shell Nausea And Vomiting   Oyster Extract Nausea And Vomiting and Other (See Comments)    clam allergenic extract   Haloperidol  And Related Other (See Comments)    Respiratory arrest requiring intubation twice.     Medications   Current Facility-Administered Medications:    acetaminophen  (TYLENOL ) tablet 650 mg, 650 mg, Oral, Q6H PRN **OR** acetaminophen  (TYLENOL ) suppository 650 mg, 650 mg, Rectal, Q6H PRN, Franky Redia SAILOR, MD   ammonia  inhalant 1 each, 1 each, Inhalation, Once, Kommor, [name], MD   atorvastatin  (LIPITOR) tablet 20 mg, 20 mg, Oral, QHS, Franky Redia SAILOR, MD   brimonidine  (ALPHAGAN ) 0.2 % ophthalmic solution 1 drop, 1 drop, Both Eyes, BID **AND** timolol  (TIMOPTIC ) 0.5 % ophthalmic solution 1 drop, 1 drop, Both Eyes, BID, Laron Agent, RPH   carbidopa -levodopa  (SINEMET  CR) 50-200 MG per tablet controlled release 1 tablet, 1 tablet, Oral, QHS, Kommor, [name], MD   carbidopa -levodopa  (SINEMET  IR) 25-100 MG per tablet immediate release 2 tablet, 2 tablet, Oral, TID, Kommor, [name], MD, 2 tablet at 03/11/24 2138   diazepam  (VALIUM ) injection 2.5  mg, 2.5 mg, Intravenous, Once PRN, Kommor, [name], MD   enoxaparin  (LOVENOX ) injection 40 mg, 40 mg, Subcutaneous, Q24H, Franky Redia SAILOR, MD   hydrALAZINE  (APRESOLINE ) tablet 50 mg, 50 mg, Oral, BID, Franky Redia SAILOR, MD   QUEtiapine  (SEROQUEL ) tablet 25 mg, 25 mg, Oral, QHS, Kommor, [name], MD, 25 mg at 03/11/24 1943   rotigotine  (NEUPRO ) 2 MG/24HR 1 patch, 1 patch, Transdermal, Daily, Franky Redia SAILOR, MD  Vitals   Vitals:   03/11/24 1727 03/11/24 2151 03/12/24 0030 03/12/24 0115  BP:  (!) 178/88 (!) 158/68 (!) 153/60  Pulse: 88 80 72 70  Resp: 20 15 17 18   Temp: (!) 97.5 F (36.4 C) 98.3 F (36.8 C)    TempSrc: Axillary Oral    SpO2: 100% 100% 96% 96%  Weight: 71.7 kg     Height: 6' (1.829 m)       Body mass index is 21.44 kg/m.   Physical Exam   Constitutional: Elderly gentleman lying in bed Psych: Pleasant, joking Eyes: No scleral injection.  HENT: No OP obstruction. Hypophonic. Hypomimia. Head: Normocephalic.  Cardiovascular: Warm to touch Respiratory: Effort normal, non-labored breathing.  GI: No distension. Skin: WDI.   Neurologic Examination   *Unsure of last dose of Sinemet , however was receiving his first dose inpatient  at that time; 2 hours after Sinemet  25mg *  Mental status: Alert, oriented to person, time and place. Able to provide history.  Speech: No aphasia. Eloquent. Cranial nerves: PERRL, EOMI, left facial droop, V1-3 intact to light touch bilaterally, dysarthric, hearing grossly intact, tongue protrudes midline with full ROM, SCM strong bilaterally. Motor: No tremor noted.  + [place], however no rigidity in joints. Full strength throughout. Decrement with finger tapping bilaterally. Reflexes: 1+ throughout with downgoing toes Sensation: Intact to light touch throughout. Coordination: FTN intact. Gait: deferred  Labs/Imaging/Neurodiagnostic studies   CBC:  Recent Labs  Lab 2024-03-26 1753  WBC 8.4  NEUTROABS 5.0  HGB  10.8*  HCT 33.7*  MCV 89.2  PLT 253   Basic Metabolic Panel:  Lab Results  Component Value Date   NA 138 26-Mar-2024   K 3.4 (L) 2024/03/26   CO2 20 (L) 03/26/24   GLUCOSE 92 2024/03/26   BUN 14 26-Mar-2024   CREATININE 0.79 03-26-2024   CALCIUM  8.2 (L) March 26, 2024   GFRNONAA >60 03/26/2024   GFRAA 85 11/18/2019   Lipid Panel:  Lab Results  Component Value Date   LDLCALC 31 04/28/2021   HgbA1c:  Lab Results  Component Value Date   HGBA1C 5.4 04/24/2021   Urine Drug Screen:     Component Value Date/Time   LABOPIA NONE DETECTED 03-26-24 1829   COCAINSCRNUR NONE DETECTED 03/26/2024 1829   LABBENZ NONE DETECTED Mar 26, 2024 1829   AMPHETMU NONE DETECTED Mar 26, 2024 1829   THCU NONE DETECTED 03/26/24 1829   LABBARB NONE DETECTED 03/26/2024 1829     ASSESSMENT & RECOMMENDATIONS   Terry [name] is a 78 y.o. male with Parkinson's & history of psychosis who presents with reported erratic behavior and uncontrollable abnormal movements. It is unclear if this is secondary to possible abdominal pain. However, this is the second time of similar presentation. During last admission and also tonight, it appears that he responds well to Seroquel . If he continues to show abnormal movements that are dyskinesia rather than behavioral, then can consider adding a second medication. Unfortunately due to his history of psychosis, adding a dopaminergic agent may worsen his situation. A small dose of amantadine can be considered.   It is interesting that pimavanserin was discontinued due to possibility of orthostatic hypotension. I would consider revisiting restarting pimavanserin if it appears that the patient may discontinue Seroquel  on his own again. Pimavanserin does not have the alpha-1 adrenoceptor blocking activity that is thought to drive the orthostatic hypotension typical of antipsychotics.   As for Sinemet  regimen, would recommend reverting to Sinemet  IR q3h dosing to avoid dyskesthesia  & wearing off: Carbidopa -levodopa  CR 50-200 at 9pm Carbidopa -levodopa  IR 25-100 2 tablets at 0900, 1500, 1800 Carbidopa -levodopa  IR 25-100 3 tablets at 0600, 1200, 2100  ______________________________________________________________________    Signed, Britteny Fiebelkorn M Mazelle Huebert, MD [facility]

## 2024-03-12 NOTE — Care Management Obs Status (Signed)
 MEDICARE OBSERVATION STATUS NOTIFICATION   Patient Details  Name: Terry [name] MRN: [id number] Date of Birth: August 09, 1945   Medicare Observation Status Notification Given:     Verbally reviewed observation notice with [name]  telephonically at [phone].    Yaretzi Ernandez 03/12/2024, 4:05 PM

## 2024-03-12 NOTE — Progress Notes (Signed)
 PROGRESS NOTE    Terry [name]  FMW:[id number] DOB: October 18, 1945 DOA: 03/11/2024 PCP: Burney Darice CROME, MD   Brief Narrative:  Terry [name] is a 78 y.o. male with history of Parkinson's disease, carotid stenosis, hypertension who was recently admitted on 02/19/2024 for acute delirium and patient received Valium /Haldol /Seroquel  with improvement but later on became altered hypoxic bradycardic and was intubated and admitted to ICU subsequently extubated but developed respiratory arrest following administration of Haldol  was reintubated.   Patient presents back to our facility with worsening erratic behavior and bilateral ballismus.  Hospitalist called for admission, neurology called in consult.  Assessment & Plan:   Principal Problem:   Acute delirium Active Problems:   Essential hypertension   Parkinson's disease (HCC)   HLD (hyperlipidemia)   GERD (gastroesophageal reflux disease)   Chronic tension headaches   Anemia   Lower abdominal pain  Acute encephalopathy on baseline dementia, resolving  Rule out delirium/polypharmacy -Restless and confused at intake, improved with diazepam  and low-dose Seroquel  -Patient more awake alert oriented today indicates this is all due to missed medications -Neurology consulted at intake, appreciate insight recommendations, unclear if this is related to patient's Parkinson's disease/medications or underlying psychiatric symptoms given below -Continue carbidopa /levodopa  and rotigotine , Seroquel   Abdominal pain, resolved Unclear etiology, patient poor historian intake, CT abdomen pelvis unremarkable for acute findings   Hypertension uncontrolled Likely secondary to medication noncompliance/agitation, improved .  Chronic anemia limit lab draws unless necessary, near baseline  History of carotid stenosis on statins.  Recent admission for delirium required intubation twice in the setting of Haldol /polypharmacy.  See HPI.  DVT prophylaxis:  enoxaparin  (LOVENOX ) injection 40 mg Start: 03/12/24 1000 Code Status:   Code Status: Full Code Family Communication: At bedside  Status is: Inpatient  Dispo: The patient is from: Home              Anticipated d/c is to: Home              Anticipated d/c date is: 24 to 48 hours              Patient currently not medically stable for discharge  Consultants:  Neurology  Procedures:  None  Antimicrobials:  None  Subjective: No acute issues or events overnight appears to be improving/approaching baseline per family at bedside  Objective: Vitals:   03/12/24 0030 03/12/24 0115 03/12/24 0200 03/12/24 0426  BP: (!) 158/68 (!) 153/60 (!) 177/83 (!) 166/66  Pulse: 72 70 72 70  Resp: 17 18 18 18   Temp:   97.8 F (36.6 C) 97.6 F (36.4 C)  TempSrc:   Oral Oral  SpO2: 96% 96% 100% 99%  Weight:      Height:       No intake or output data in the 24 hours ending 03/12/24 0705 Filed Weights   03/11/24 1727  Weight: 71.7 kg    Examination:  General:  Pleasantly resting in bed, No acute distress. HEENT:  Normocephalic atraumatic.  Sclerae nonicteric, noninjected.  Extraocular movements intact bilaterally. Neck:  Without mass or deformity.  Trachea is midline. Lungs:  Clear to auscultate bilaterally without rhonchi, wheeze, or rales. Heart:  Regular rate and rhythm.  Without murmurs, rubs, or gallops. Abdomen:  Soft, nontender, nondistended.  Without guarding or rebound. Extremities: Without cyanosis, clubbing, edema, or obvious deformity. Skin:  Warm and dry, no erythema.   Data Reviewed: I have personally reviewed following labs and imaging studies  CBC: Recent Labs  Lab 03/11/24 1753 03/12/24 9796  WBC 8.4 7.7  NEUTROABS 5.0  --   HGB 10.8* 10.6*  HCT 33.7* 32.6*  MCV 89.2 88.3  PLT 253 260   Basic Metabolic Panel: Recent Labs  Lab 03/11/24 1753 03/12/24 0203  NA 138 138  K 3.4* 3.8  CL 108 106  CO2 20* 24  GLUCOSE 92 98  BUN 14 11  CREATININE 0.79 0.75   CALCIUM  8.2* 8.4*   GFR: Estimated Creatinine Clearance: 78.4 mL/min (by C-G formula based on SCr of 0.75 mg/dL). Liver Function Tests: Recent Labs  Lab 03/11/24 1753 03/12/24 0203  AST 23 17  ALT 5 <5  ALKPHOS 53 51  BILITOT 0.7 0.7  PROT 5.9* 6.0*  ALBUMIN 3.2* 3.2*   Cardiac Enzymes: Recent Labs  Lab 03/11/24 1753  CKTOTAL 92   Sepsis Labs: Recent Labs  Lab 03/11/24 1758  LATICACIDVEN 1.3   No results found for this or any previous visit (from the past 240 hours).   Radiology Studies: CT ABDOMEN PELVIS WO CONTRAST Result Date: 03/11/2024 CLINICAL DATA:  Dark stool and abdominal pain EXAM: CT ABDOMEN AND PELVIS WITHOUT CONTRAST TECHNIQUE: Multidetector CT imaging of the abdomen and pelvis was performed following the standard protocol without IV contrast. RADIATION DOSE REDUCTION: This exam was performed according to the departmental dose-optimization program which includes automated exposure control, adjustment of the mA and/or kV according to patient size and/or use of iterative reconstruction technique. COMPARISON:  CT 02/19/2024 FINDINGS: Lower chest: No acute abnormality. Hepatobiliary: Unremarkable noncontrast appearance of the liver, gallbladder, and biliary tree. Pancreas: Unremarkable. Spleen: Unremarkable. Adrenals/Urinary Tract: Normal adrenal glands. No urinary calculi or hydronephrosis. Unremarkable bladder. Stomach/Bowel: Normal caliber large and small bowel. No bowel wall thickening. Normal appendix. Stomach is within normal limits. Vascular/Lymphatic: Aortic atherosclerosis. No enlarged abdominal or pelvic lymph nodes. Reproductive: Unremarkable. Other: No free intraperitoneal fluid or air. Musculoskeletal: No acute fracture. IMPRESSION: 1. No acute abnormality in the abdomen or pelvis. 2. Aortic Atherosclerosis (ICD10-I70.0). Electronically Signed   By: Norman Gatlin M.D.   On: 03/11/2024 23:03        Scheduled Meds:  ammonia   1 each Inhalation Once    atorvastatin   20 mg Oral QHS   brimonidine   1 drop Both Eyes BID   And   timolol   1 drop Both Eyes BID   carbidopa -levodopa   1 tablet Oral QHS   carbidopa -levodopa   2 tablet Oral TID   enoxaparin  (LOVENOX ) injection  40 mg Subcutaneous Q24H   hydrALAZINE   50 mg Oral BID   rotigotine   1 patch Transdermal Daily   Continuous Infusions:   LOS: 0 days   Time spent:  Elsie JAYSON Montclair, DO [facility]  If 7PM-7AM, please contact night-coverage [email or web address]  03/12/2024, 7:05 AM

## 2024-03-12 NOTE — Progress Notes (Addendum)
 NEUROLOGY CONSULT FOLLOW UP NOTE   Date of service: March 12, 2024 Patient Name: Terry [name] MRN:  [id number] DOB:  1946/01/16  Interval Hx/subjective   Wife at bedside. Patient sitting up in bed. Alert and oriented.  Continued dyskinesia movements, specifically head bobbing and side-to-side motion.   Vitals   Vitals:   03/12/24 0030 03/12/24 0115 03/12/24 0200 03/12/24 0426  BP: (!) 158/68 (!) 153/60 (!) 177/83 (!) 166/66  Pulse: 72 70 72 70  Resp: 17 18 18 18   Temp:   97.8 F (36.6 C) 97.6 F (36.4 C)  TempSrc:   Oral Oral  SpO2: 96% 96% 100% 99%  Weight:      Height:         Body mass index is 21.44 kg/m.  Physical Exam   Constitutional: elderly patient, appears well-developed and well-nourished.  Psych: Affect appropriate to situation.  Cardiovascular: S1S2, +2 pulses Respiratory: Effort normal, non-labored breathing.   Neurologic Examination   Neuro: Mental Status: Patient is awake, alert, oriented to person, place, month, year, and situation. Patient is able to give a clear and coherent history. No signs of aphasia or neglect. Moderate dysarthria.  Cranial Nerves: II: Visual Fields are full. Pupils are equal, round, and reactive to light.   III,IV, VI: EOMI without ptosis or diploplia.  V: Facial sensation is symmetric to temperature VII: Left facial droop   VIII: hearing is intact to voice X: Uvula elevates symmetrically XI: Shoulder shrug is symmetric. XII: tongue is midline without atrophy or fasciculations.  Motor: Tone is normal. Bulk is normal. 5/5 strength was present in all four extremities.  Sensory: Sensation is symmetric to light touch and temperature in the arms and legs. Deep Tendon Reflexes: 2+ and symmetric in the biceps and patellae.  Plantars: Toes are downgoing bilaterally.  Cerebellar: FNF and HKS are slow, but intact bilaterally Consistent head bobbing and side-to-side movement Intermittent left hand tremor with  movement   Medications  Current Facility-Administered Medications:    acetaminophen  (TYLENOL ) tablet 650 mg, 650 mg, Oral, Q6H PRN, 650 mg at 03/12/24 0647 **OR** acetaminophen  (TYLENOL ) suppository 650 mg, 650 mg, Rectal, Q6H PRN, Terry Redia SAILOR, MD   ammonia  inhalant 1 each, 1 each, Inhalation, Once, [name], [name], MD   atorvastatin  (LIPITOR) tablet 20 mg, 20 mg, Oral, QHS, Terry Redia SAILOR, MD   brimonidine  (ALPHAGAN ) 0.2 % ophthalmic solution 1 drop, 1 drop, Both Eyes, BID, 1 drop at 03/12/24 0306 **AND** timolol  (TIMOPTIC ) 0.5 % ophthalmic solution 1 drop, 1 drop, Both Eyes, BID, Terry [name], RPH, 1 drop at 03/12/24 9693   carbidopa -levodopa  (SINEMET  CR) 50-200 MG per tablet controlled release 1 tablet, 1 tablet, Oral, QHS, [name], [name], MD   carbidopa -levodopa  (SINEMET  IR) 25-100 MG per tablet immediate release 2 tablet, 2 tablet, Oral, TID, [name], [name], MD, 2 tablet at 03/12/24 9352   diazepam  (VALIUM ) injection 2.5 mg, 2.5 mg, Intravenous, Once PRN, [name], [name], MD   enoxaparin  (LOVENOX ) injection 40 mg, 40 mg, Subcutaneous, Q24H, Terry Redia SAILOR, MD   hydrALAZINE  (APRESOLINE ) tablet 50 mg, 50 mg, Oral, BID, Terry [name], Dorn, MD, 50 mg at 03/12/24 9352   QUEtiapine  (SEROQUEL ) tablet 25 mg, 25 mg, Oral, QHS PRN, Terry Redia SAILOR, MD   rotigotine  (NEUPRO ) 2 MG/24HR 1 patch, 1 patch, Transdermal, Daily, Terry Redia SAILOR, MD  Labs and Diagnostic Imaging   CBC:  Recent Labs  Lab 03/11/24 1753 03/12/24 0203  WBC 8.4 7.7  NEUTROABS 5.0  --   HGB 10.8* 10.6*  HCT 33.7* 32.6*  MCV 89.2 88.3  PLT 253 260    Basic Metabolic Panel:  Lab Results  Component Value Date   NA 138 03/12/2024   K 3.8 03/12/2024   CO2 24 03/12/2024   GLUCOSE 98 03/12/2024   BUN 11 03/12/2024   CREATININE 0.75 03/12/2024   CALCIUM  8.4 (L) 03/12/2024   GFRNONAA >60 03/12/2024   GFRAA 85 11/18/2019   Lipid Panel:  Lab Results  Component Value Date   LDLCALC 31  04/28/2021   HgbA1c:  Lab Results  Component Value Date   HGBA1C 5.4 04/24/2021   Urine Drug Screen:     Component Value Date/Time   LABOPIA NONE DETECTED 03/11/2024 1829   COCAINSCRNUR NONE DETECTED 03/11/2024 1829   LABBENZ NONE DETECTED 03/11/2024 1829   AMPHETMU NONE DETECTED 03/11/2024 1829   THCU NONE DETECTED 03/11/2024 1829   LABBARB NONE DETECTED 03/11/2024 1829    Alcohol Level No results found for: ETH INR No results found for: INR APTT No results found for: APTT AED levels: No results found for: PHENYTOIN, ZONISAMIDE, LAMOTRIGINE, LEVETIRACETA  7/29 CT Head without contrast(Personally reviewed): Negative   Assessment   Terry [name] is a 78 y.o. male with Parkinson's & history of psychosis who presents with reported erratic behavior and uncontrollable abnormal movements. It is unclear if this is secondary to possible abdominal pain. However, this is the second time of similar presentation.   During this and previous admission, it appears that he responds well to Seroquel  with improvement in dyskinesia movements today.  Pimavanserin was previously discontinued due to concern for worsening of orthostatic hypotension. However, with this presentation, it appears that the benefits of this medication outweigh the possibility of worsening orthostatic hypotension.   Recommendations   - continue Sinement Q3H dosing while inpatient Carbidopa -levodopa  CR 50-200 at 9pm Carbidopa -levodopa  IR 25-100 2 tablets at 0900, 1500, 1800 Carbidopa -levodopa  IR 25-100 2 tablets at 0600, 1200, 2100 - at discharge, resume home crexont  dosing.  - resume pimavanserin at discharge (non-formulary inpatient) - neurology will be available as needed ______________________________________________________________________  Signed, Terry JAYSON Likes, NP [facility]  I have seen the patient reviewed the above note.  He had significant dyskinesias previously with higher doses  of Sinemet , I would resume his correct thought at discharge as he seemed to be doing well on this, and I will order 25-100 2 tablets every 3 hours while he is here since we do not have crexont  on formulary  Terry Seals, MD [facility]   If 7pm- 7am, please page neurology on call as listed in AMION.

## 2024-03-12 NOTE — Care Management Obs Status (Signed)
 MEDICARE OBSERVATION STATUS NOTIFICATION   Patient Details  Name: Terry [name] MRN: [id number] Date of Birth: 1945-09-22   Medicare Observation Status Notification Given:    Verbally reviewed observation notice with [name] Boza  telephonically at [phone].      Julus Kelley 03/12/2024, 2:32 PM

## 2024-03-13 ENCOUNTER — Other Ambulatory Visit (HOSPITAL_COMMUNITY): Payer: Self-pay

## 2024-03-13 DIAGNOSIS — R410 Disorientation, unspecified: Secondary | ICD-10-CM | POA: Diagnosis not present

## 2024-03-13 MED ORDER — ROTIGOTINE 2 MG/24HR TD PT24
1.0000 | MEDICATED_PATCH | Freq: Every day | TRANSDERMAL | 2 refills | Status: DC
  Filled 2024-03-13: qty 30, 30d supply, fill #0

## 2024-03-13 MED ORDER — POLYETHYLENE GLYCOL 3350 17 GM/SCOOP PO POWD
17.0000 g | Freq: Two times a day (BID) | ORAL | 0 refills | Status: DC
  Filled 2024-03-13: qty 238, 7d supply, fill #0

## 2024-03-13 MED ORDER — HYDRALAZINE HCL 50 MG PO TABS
50.0000 mg | ORAL_TABLET | Freq: Two times a day (BID) | ORAL | Status: DC
  Administered 2024-03-13 – 2024-03-14 (×3): 50 mg via ORAL
  Filled 2024-03-13 (×3): qty 1

## 2024-03-13 MED ORDER — CARBIDOPA-LEVODOPA 25-100 MG PO TABS
2.0000 | ORAL_TABLET | ORAL | 1 refills | Status: DC
  Filled 2024-03-13: qty 360, 30d supply, fill #0

## 2024-03-13 MED ORDER — CARBIDOPA-LEVODOPA ER 50-200 MG PO TBCR
1.0000 | EXTENDED_RELEASE_TABLET | Freq: Every day | ORAL | 0 refills | Status: DC
  Filled 2024-03-13: qty 30, 30d supply, fill #0

## 2024-03-13 MED ORDER — LACTATED RINGERS IV BOLUS
500.0000 mL | Freq: Once | INTRAVENOUS | Status: AC
  Administered 2024-03-13: 500 mL via INTRAVENOUS

## 2024-03-13 MED ORDER — QUETIAPINE FUMARATE 25 MG PO TABS
25.0000 mg | ORAL_TABLET | Freq: Every evening | ORAL | 0 refills | Status: DC | PRN
  Filled 2024-03-13: qty 30, 30d supply, fill #0

## 2024-03-13 NOTE — Progress Notes (Addendum)
 Transition of Care [facility]) - Inpatient Brief Assessment   Patient Details  Name: Terry [name] MRN: [id number] Date of Birth: [birth year]  Transition of Care [facility]) CM/SW Contact:    Rosaline JONELLE Joe, RN Phone Number: 03/13/2024, 10:32 AM   Clinical Narrative: Patient admitted to Acute encephalopathy.  Patient lives with his spouse at [facility] and plans to return home today.  DME at the home includes [place], rolator, RW and WC.  I was unable to reach the wife by phone but left a detailed voicemail.  I spoke with the patient at the bedside and offered Medicare choice regarding home health and patient did not have a preference.  I called Whitney, admissions at [facility] and she states that they normally use [facility].  HH orders for PT/OT placed to be co-signed by MD.  Patient plans to discharge home today with his wife by car once medically stable.  03/13/24 1152 - Bedside nursing states that the patient's wife arrived and she states that she is unable to provide care at the home and requests that patient go to [facility] short term.  Patient is tearful and states that he is in agreement.  [facility], MSW is aware and will follow up with [facility] for possible short-term placement.   Transition of Care Asessment: Insurance and Status: (P) Insurance coverage has been reviewed Patient has primary care physician: (P) Yes Home environment has been reviewed: (P) from [facility] with wife Prior level of function:: (P) self Prior/Current Home Services: (P) Current home services (DME at the home includes [place], Rolator, RW, and WC) Social Drivers of Health Review: (P) SDOH reviewed interventions complete Readmission risk has been reviewed: (P) Yes Transition of care needs: (P) transition of care needs identified, TOC will continue to follow

## 2024-03-13 NOTE — Progress Notes (Signed)
 OT Cancellation Note  Patient Details Name: Jonah Gingras MRN: [id number] DOB: 11/06/1945   Cancelled Treatment:    Reason Eval/Treat Not Completed: Other (comment) (Note pt has orders to discharge with HHOT and PT placed once back at Independent living.  Will defer OT eval to home health in light of patient discharing today.)  Mykenna Viele OTR/L 03/13/2024, 11:45 AM

## 2024-03-13 NOTE — Evaluation (Addendum)
 Physical Therapy Evaluation Patient Details Name: Terry [name] MRN: [id number] DOB: [birth year] Today's Date: 03/13/2024  History of Present Illness  Pt is a 78 y.o. male who presented 03/11/24 with worsening erratic behavior and bilateral ballismus. Admitted with acute encephalopathy on baseline dementia, unclear if related to polypharmacy or delirium. Of note, pt recently admitted on 02/19/2024 for acute delirium and patient received Valium /Haldol /Seroquel  with improvement but later on became altered hypoxic bradycardic and was intubated and admitted to ICU subsequently extubated but developed respiratory arrest following administration of Haldol  was reintubated. PMHx: Parkinson's disease, CVA, carotid artery stenosis, chronic headaches   Clinical Impression  Pt presents with condition above and deficits mentioned below, see PT Problem List. PTA, he was mod I utilizing a rollator for functional mobility, living with his wife at an ILF in a 1-level apartment with an elevator level entry. Pt currently demonstrates deficits in balance, endurance, generalized strength, and coordination. He displays involuntary movements, like chorea, throughout his body that impact his balance and safety with mobility. He is currently requiring supervision for bed mobility, CGA for transfers, and CGA-minA to ambulate up to ~90 ft at a time with RW support. Educated pt on his risk for falls and this PT's recs for pt to use his walker and have someone hold onto him with his gait belt during all standing mobility upon d/c. Educated him to use his w/c if standing/walking is unsafe. He verbalized understanding and endorsed his wife can provide the level of care he currently requires to safely d/c home at this time. Pt declines desire to return to a SNF for further rehab. Thus, will recommend pt d/c home to his ILF with HHPT follow-up and a HH aide. Will continue to follow acutely.   Addendum at 12:08 - Per social worker's  discussion with pt's wife, the wife cannot provide the level of care the pt is currently requiring and therefore is requesting further inpatient rehab, < 3 hours/day, which he could benefit from, to maximize his independence prior to his return to his ILF. Updated d/c recs to reflect this new information.      If plan is discharge home, recommend the following: Assistance with cooking/housework;Assist for transportation;Help with stairs or ramp for entrance;A little help with walking and/or transfers;A little help with bathing/dressing/bathroom;Direct supervision/assist for medications management;Direct supervision/assist for financial management   Can travel by private vehicle   Yes    Equipment Recommendations None recommended by PT  Recommendations for Other Services  OT consult    Functional Status Assessment Patient has had a recent decline in their functional status and demonstrates the ability to make significant improvements in function in a reasonable and predictable amount of time.     Precautions / Restrictions Precautions Precautions: Fall Restrictions Weight Bearing Restrictions Per Provider Order: No      Mobility  Bed Mobility Overal bed mobility: Needs Assistance Bed Mobility: Supine to Sit     Supine to sit: Supervision, Used rails     General bed mobility comments: Supervision for safety exiting R EOB    Transfers Overall transfer level: Needs assistance Equipment used: Rolling walker (2 wheels) Transfers: Sit to/from Stand Sit to Stand: Contact guard assist           General transfer comment: Pt pulls up on RW each rep, x3 reps total, CGA for safety without LOB each rep    Ambulation/Gait Ambulation/Gait assistance: Contact guard assist, Min assist Gait Distance (Feet): 90 Feet (x3 bouts of ~80 ft > ~  90 ft > ~15 ft) Assistive device: Rolling walker (2 wheels) Gait Pattern/deviations: Step-through pattern, Decreased stride length, Shuffle,  Narrow base of support, Trunk flexed Gait velocity: reduced Gait velocity interpretation: <1.8 ft/sec, indicate of risk for recurrent falls   General Gait Details: Pt ambulates with involuntary chorea movements, resulting in intermittent narrow feet placement and intermittent LOB, needing minA to recover and maintain balance. The majority of the time though he is able to sustain his balance with RW support and CGA for safety. Pt needs repeated cues to keep the RW proximal to him as he often pushes it distally and then has to take steps while keeping it still to catch up with it.  Stairs            Wheelchair Mobility     Tilt Bed    Modified Rankin (Stroke Patients Only)       Balance Overall balance assessment: Needs assistance Sitting-balance support: No upper extremity supported, Feet supported Sitting balance-Leahy Scale: Good Sitting balance - Comments: able to reach off COG to donn socks sitting EOB with supervision for safety   Standing balance support: Bilateral upper extremity supported, During functional activity, Reliant on assistive device for balance Standing balance-Leahy Scale: Poor Standing balance comment: reliant on RW, intermittent LOB noted and minA needed to recover                             Pertinent Vitals/Pain Pain Assessment Pain Assessment: No/denies pain    Home Living Family/patient expects to be discharged to:: Private residence Living Arrangements: Spouse/significant other Available Help at Discharge: Family;Available 24 hours/day Type of Home: Independent living facility Home Access: Level entry;Elevator       Home Layout: One level Home Equipment: Scientist, research (medical) (4 wheels);Shower seat;Grab bars - tub/shower;Grab bars - toilet      Prior Function Prior Level of Function : History of Falls (last six months);Independent/Modified Independent             Mobility Comments: ModI with rollator, >2 falls due  to losing balance ADLs Comments: Mod I; manages his own pill box; can drive but has decided it is best for him not to     Extremity/Trunk Assessment   Upper Extremity Assessment Upper Extremity Assessment: Defer to OT evaluation    Lower Extremity Assessment Lower Extremity Assessment: Generalized weakness (denied numbness/tingling; incoordination noted; non-rhythmic involuntary movements, chorea, noted in bil legs)    Cervical / Trunk Assessment Cervical / Trunk Assessment: Kyphotic  Communication   Communication Communication: Impaired Factors Affecting Communication: Difficulty expressing self;Reduced clarity of speech (may be baseline due to Parkinson's)    Cognition Arousal: Alert Behavior During Therapy: WFL for tasks assessed/performed   PT - Cognitive impairments: No family/caregiver present to determine baseline                       PT - Cognition Comments: Pt able to identify when he needs to sit and rest. Follows 1-2 step cues consistently. Likely at baseline, but no family present to confirm. Following commands: Intact       Cueing Cueing Techniques: Verbal cues     General Comments General comments (skin integrity, edema, etc.): Unable to get BP read on L UE due to frequent chorea movements, BP 151/121 on R leg supine start of session, 169/90 R leg sitting end of session; educated pt on his risk for falls and this PT's  recs for pt to use his walker and have someone hold onto him with his gait belt during all standing mobility upon d/c. Educated him to use his w/c if standing/walking is unsafe. He verbalized understanding and endorsed his wife can provide the level of care he currently requires to safely d/c home at this time.    Exercises     Assessment/Plan    PT Assessment Patient needs continued PT services  PT Problem List Decreased strength;Decreased activity tolerance;Decreased mobility;Decreased balance;Decreased coordination;Decreased  cognition;Decreased knowledge of use of DME       PT Treatment Interventions DME instruction;Gait training;Functional mobility training;Therapeutic activities;Therapeutic exercise;Neuromuscular re-education;Balance training;Patient/family education;Cognitive remediation    PT Goals (Current goals can be found in the Care Plan section)  Acute Rehab PT Goals Patient Stated Goal: to go home PT Goal Formulation: With patient Time For Goal Achievement: 03/27/24 Potential to Achieve Goals: Good    Frequency Min 2X/week     Co-evaluation               AM-PAC PT 6 Clicks Mobility  Outcome Measure Help needed turning from your back to your side while in a flat bed without using bedrails?: A Little Help needed moving from lying on your back to sitting on the side of a flat bed without using bedrails?: A Little Help needed moving to and from a bed to a chair (including a wheelchair)?: A Little Help needed standing up from a chair using your arms (e.g., wheelchair or bedside chair)?: A Little Help needed to walk in hospital room?: A Little Help needed climbing 3-5 steps with a railing? : Total 6 Click Score: 16    End of Session Equipment Utilized During Treatment: Gait belt Activity Tolerance: Patient tolerated treatment well Patient left: in chair;with call bell/phone within reach;with chair alarm set;Other (comment) (with fall mat down) Nurse Communication: Mobility status PT Visit Diagnosis: Unsteadiness on feet (R26.81);Other abnormalities of gait and mobility (R26.89);Muscle weakness (generalized) (M62.81);History of falling (Z91.81);Repeated falls (R29.6);Difficulty in walking, not elsewhere classified (R26.2);Other symptoms and signs involving the nervous system (R29.898)    Time: 9250-9172 PT Time Calculation (min) (ACUTE ONLY): 38 min   Charges:   PT Evaluation $PT Eval Moderate Complexity: 1 Mod PT Treatments $Gait Training: 8-22 mins $Therapeutic Activity: 8-22  mins PT General Charges $$ ACUTE PT VISIT: 1 Visit         Theo Ferretti, PT, DPT Acute Rehabilitation Services  Office: [phone]   Theo CHRISTELLA Ferretti 03/13/2024, 8:48 AM

## 2024-03-13 NOTE — NC FL2 (Signed)
 [place]  MEDICAID FL2 LEVEL OF CARE FORM     IDENTIFICATION  Patient Name: Terry [name] Birthdate: [birth year] Sex: male Admission Date (Current Location): 03/11/2024  [facility] and [place] Number:  Producer, television/film/video and Address:  [facility], [address] [place], [place] [address]      Provider Number: [id number]  Attending Physician Name and Address:  Lue Elsie BROCKS, MD  Relative Name and Phone Number:  Terry, [name] (Spouse)  [phone]    Current Level of Care: Hospital Recommended Level of Care: Skilled Nursing Facility Prior Approval Number:    Date Approved/Denied:   PASRR Number: [id number]  Discharge Plan: SNF    Current Diagnoses: Patient Active Problem List   Diagnosis Date Noted   Acute delirium 03/11/2024   AMS (altered mental status) 02/20/2024   Delirium 02/19/2024   Lower abdominal pain 11/11/2021   Irritable bowel syndrome with both constipation and diarrhea 11/11/2021   Change in bowel habits 11/11/2021   History of iron deficiency 11/11/2021   Constipation 09/09/2021   Gastroesophageal reflux disease 09/09/2021   Anemia 09/09/2021   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   Carotid artery occlusion 06/14/2021   Stroke (HCC) 04/24/2021   Headache 04/24/2021   Hypertensive crisis 04/23/2021   Chest tightness 10/09/2019   Shortness of breath 10/09/2019   Erectile dysfunction 03/12/2019   Essential hypertension 04/29/2018   Need for immunization against influenza 04/29/2018   Parkinson's disease (HCC) 04/29/2018   Carotid artery stenosis 04/29/2018   HLD (hyperlipidemia) 04/29/2018   GERD (gastroesophageal reflux disease) 04/29/2018   Colon cancer screening 04/29/2018   Chronic tension headaches 04/29/2018   Murmur, cardiac 02/12/2018   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   Pure  hypercholesterolemia 04/28/2016    Orientation RESPIRATION BLADDER Height & Weight     Self, Place  Normal Continent Weight: 158 lb 1.1 oz (71.7 kg) Height:  6' (182.9 cm)  BEHAVIORAL SYMPTOMS/MOOD NEUROLOGICAL BOWEL NUTRITION STATUS      Continent Diet  AMBULATORY STATUS COMMUNICATION OF NEEDS Skin   Limited Assist   Normal                       Personal Care Assistance Level of Assistance  Bathing, Feeding, Dressing Bathing Assistance: Limited assistance Feeding assistance: Limited assistance Dressing Assistance: Limited assistance     Functional Limitations Info  Sight, Hearing, Speech Sight Info: Adequate Hearing Info: Adequate Speech Info: Adequate    SPECIAL CARE FACTORS FREQUENCY  OT (By licensed OT), PT (By licensed PT)     PT Frequency: 5x/week OT Frequency: 5x/week            Contractures Contractures Info: Not present    Additional Factors Info  Code Status, Allergies Code Status Info: FULL Allergies Info: Erythromycin  Haldol  (Haloperidol )  Penicillins  Azithromycin  Clam Shell  Oyster Extract  Haloperidol  And Related           Current Medications (03/13/2024):  This is the current hospital active medication list Current Facility-Administered Medications  Medication Dose Route Frequency Provider Last Rate Last Admin   acetaminophen  (TYLENOL ) tablet 650 mg  650 mg Oral Q6H PRN Kakrakandy, Arshad N, MD   650 mg at 03/12/24 9352   Or   acetaminophen  (TYLENOL ) suppository 650 mg  650 mg Rectal Q6H PRN Franky Redia SAILOR, MD  ammonia  inhalant 1 each  1 each Inhalation Once Kommor, [name], MD       atorvastatin  (LIPITOR) tablet 20 mg  20 mg Oral QHS Kakrakandy, Arshad N, MD   20 mg at 03/12/24 2129   brimonidine  (ALPHAGAN ) 0.2 % ophthalmic solution 1 drop  1 drop Both Eyes BID Laron Agent, RPH   1 drop at 03/12/24 2130   And   timolol  (TIMOPTIC ) 0.5 % ophthalmic solution 1 drop  1 drop Both Eyes BID Laron Agent, RPH   1 drop at 03/12/24  2129   carbidopa -levodopa  (SINEMET  CR) 50-200 MG per tablet controlled release 1 tablet  1 tablet Oral QHS Kommor, [name], MD   1 tablet at 03/12/24 2129   carbidopa -levodopa  (SINEMET  IR) 25-100 MG per tablet immediate release 2 tablet  2 tablet Oral 6 times per day Michaela Aisha SQUIBB, MD   2 tablet at 03/13/24 9471   diazepam  (VALIUM ) injection 2.5 mg  2.5 mg Intravenous Once PRN Kommor, [name], MD       enoxaparin  (LOVENOX ) injection 40 mg  40 mg Subcutaneous Q24H Franky Redia SAILOR, MD   40 mg at 03/12/24 1152   hydrALAZINE  (APRESOLINE ) tablet 50 mg  50 mg Oral BID Sundil, Subrina, MD       polyethylene glycol (MIRALAX  / GLYCOLAX ) packet 17 g  17 g Oral BID Lue Elsie BROCKS, MD   17 g at 03/12/24 2129   QUEtiapine  (SEROQUEL ) tablet 25 mg  25 mg Oral QHS PRN Franky Redia SAILOR, MD       rotigotine  (NEUPRO ) 2 MG/24HR 1 patch  1 patch Transdermal Daily Franky Redia SAILOR, MD         Discharge Medications: Please see discharge summary for a list of discharge medications.  Relevant Imaging Results:  Relevant Lab Results:   Additional Information [id number]  Terry Heyne A [place], LCSW

## 2024-03-13 NOTE — Discharge Summary (Addendum)
 Physician Discharge Summary  Terry [name] FMW:[id number] DOB: [birth year] DOA: 03/11/2024  PCP: Burney Darice CROME, MD  Admit date: 03/11/2024 Discharge date: 03/13/2024  Admitted From: Home Disposition: Home health PT  Recommendations for Outpatient Follow-up:  Follow up with PCP in 1-2 weeks  Home Health: PT/OT Equipment/Devices: No new equipment  Discharge Condition: Stable CODE STATUS: Full Diet recommendation: Regular diet as tolerated  Brief/Interim Summary: Terry [name] is a 78 y.o. male with history of Parkinson's disease, carotid stenosis, hypertension who presents with erratic behavior and movement from home.  Patient's medications were adjusted with assistance from neurology and he is otherwise at this point stable and agreeable for discharge.  Plan at this point is discharge home with home health PT OT where he previously resided.  No acute infection or other etiology that would explain patient's behavior changes other than medication changes, there is concern for noncompliance or confusion given recent changes.  We have confirmed medication dosing as below and would recommend very strongly patient continue this regimen until outpatient follow-up.  At the time of discharge patient's family indicates he is now unsafe for discharge home, case management and physical therapy to reevaluate for potential transfer to skilled facility.  He remains medically stable for discharge awaiting safe disposition.  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   Chronic anemia limit lab draws unless necessary, near baseline   History of carotid stenosis on statins.   Discharge Instructions  Discharge Instructions     Call MD for:  difficulty breathing, headache or visual disturbances   Complete by: As directed    Call MD for:  extreme fatigue   Complete by: As directed    Call MD for:  hives   Complete by: As directed    Call MD for:  persistant dizziness or light-headedness   Complete by: As directed    Call MD for:  persistant nausea and vomiting   Complete by: As directed    Call MD for:  severe uncontrolled pain   Complete by: As directed    Call MD for:  temperature >100.4   Complete by: As directed    Diet - low sodium heart healthy   Complete by: As directed    Increase activity slowly   Complete by: As directed    No wound care   Complete by: As directed       Allergies as of 03/13/2024       Reactions   Erythromycin Rash, Dermatitis, Other (See Comments)   Other Reaction(s): Unknown Stomach Cramps erythromycin   Haldol  [haloperidol ]    Haldol  caused bradycardia and respiratory arrest   Penicillins Rash, Dermatitis, Hives, Other (See Comments)   Other Reaction(s): Unknown Product containing penicillin (product)   Azithromycin Other (See Comments)   GI upset azithromycin   Clam  Shell Nausea And Vomiting   Oyster Extract Nausea And Vomiting, Other (See Comments)   clam allergenic extract   Haloperidol  And Related Other (See Comments)   Respiratory arrest requiring intubation twice.         Medication  List     STOP taking these medications    Carbidopa -Levodopa  ER 70-280 MG Cpcr Replaced by: carbidopa -levodopa  25-100 MG tablet You also have another medication with the same name that you need to continue taking as instructed.   melatonin 5 MG Tabs   Neupro  1 MG/24HR Pt24 Generic drug: Rotigotine  Replaced by: rotigotine  2 MG/24HR   ondansetron  4 MG tablet Commonly known as: ZOFRAN    pantoprazole  40 MG tablet Commonly known as: PROTONIX    traZODone  50 MG tablet Commonly known as: DESYREL        TAKE these medications    acetaminophen  325 MG tablet Commonly known as: TYLENOL  Take 325 mg by mouth daily as needed for mild pain (pain score 1-3).   aspirin  EC 81 MG tablet Take 1 tablet (81 mg total) by mouth daily. Swallow whole.   atorvastatin  20 MG tablet Commonly known as: LIPITOR TAKE ONE TABLET BY MOUTH EVERY NIGHT AT BEDTIME   brimonidine -timolol  0.2-0.5 % ophthalmic solution Commonly known as: COMBIGAN  Apply 1 drop to eye 2 (two) times daily.   carbidopa -levodopa  50-200 MG tablet Commonly known as: SINEMET  CR Take 1 tablet by mouth at bedtime. What changed:  Another medication with the same name was added. Make sure you understand how and when to take each. Another medication with the same name was removed. Continue taking this medication, and follow the directions you see here.   carbidopa -levodopa  25-100 MG tablet Commonly known as: SINEMET  IR Take 2 tablets by mouth every 3 (three) hours while awake. From 0600 through 2100 (no need for midnight/0300 dose) What changed: You were already taking a medication with the same name, and this prescription was added. Make sure you understand how and when to take each. Replaces: Carbidopa -Levodopa  ER 70-280 MG Cpcr   carbidopa -levodopa  50-200 MG tablet Commonly known as: SINEMET  CR Take 1 tablet by mouth at bedtime. What changed: You were already taking a medication with the same name, and this prescription was  added. Make sure you understand how and when to take each.   feeding supplement Liqd Take 237 mLs by mouth 2 (two) times daily between meals.   hydrALAZINE  50 MG tablet Commonly known as: APRESOLINE  TAKE 1 TABLET(50 MG) BY MOUTH TWICE DAILY What changed: See the new instructions.   M-Natal Plus 27-1 MG Tabs Take 1 tablet by mouth daily.   meloxicam  7.5 MG tablet Commonly known as: MOBIC  Take 7.5 mg by mouth 2 (two) times daily as needed.   olopatadine  0.1 % ophthalmic solution Commonly known as: PATANOL Place 1 drop into both eyes daily as needed for allergies.   polyethylene glycol powder 17 GM/SCOOP powder Commonly known as: GLYCOLAX /MIRALAX  Take 17 g by mouth 2 (two) times daily.   QUEtiapine  25 MG tablet Commonly known as: SEROQUEL  Take 1 tablet (25 mg total) by mouth at bedtime as needed (Agitation.). What changed:  when to take this reasons to take this   rotigotine  2 MG/24HR Commonly known as: NEUPRO  Place 1 patch onto the skin daily. Replaces: Neupro  1 MG/24HR Pt24   triamcinolone ointment 0.1 % Commonly known as: KENALOG Apply topically 3 (three) times daily.   VITAMIN B-12 IJ Inject 1,000 mcg as directed every 30 (thirty) days.   Vitamin D (Ergocalciferol)  1.25 MG (50000 UNIT) Caps capsule Commonly known as: DRISDOL Take 50,000 Units by mouth every Tuesday.        Follow-up Information     [facility] Follow up.   Why: [facility] will provide home health PT/OT.  They will call you in the next 24-48 hours to set up services. Contact information: [address] [place] [address] [phone]                Allergies  Allergen Reactions   Erythromycin Rash, Dermatitis and Other (See Comments)    Other Reaction(s): Unknown  Stomach Cramps  erythromycin   Haldol  [Haloperidol ]     Haldol  caused bradycardia and respiratory arrest   Penicillins Rash, Dermatitis, Hives and Other (See Comments)    Other Reaction(s):  Unknown  Product containing penicillin (product)   Azithromycin Other (See Comments)    GI upset  azithromycin   Clam Shell Nausea And Vomiting   Oyster Extract Nausea And Vomiting and Other (See Comments)    clam allergenic extract   Haloperidol  And Related Other (See Comments)    Respiratory arrest requiring intubation twice.     Consultations: Neurology  Procedures/Studies: CT ABDOMEN PELVIS WO CONTRAST Result Date: 03/11/2024 CLINICAL DATA:  Dark stool and abdominal pain EXAM: CT ABDOMEN AND PELVIS WITHOUT CONTRAST TECHNIQUE: Multidetector CT imaging of the abdomen and pelvis was performed following the standard protocol without IV contrast. RADIATION DOSE REDUCTION: This exam was performed according to the departmental dose-optimization program which includes automated exposure control, adjustment of the mA and/or kV according to patient size and/or use of iterative reconstruction technique. COMPARISON:  CT 02/19/2024 FINDINGS: Lower chest: No acute abnormality. Hepatobiliary: Unremarkable noncontrast appearance of the liver, gallbladder, and biliary tree. Pancreas: Unremarkable. Spleen: Unremarkable. Adrenals/Urinary Tract: Normal adrenal glands. No urinary calculi or hydronephrosis. Unremarkable bladder. Stomach/Bowel: Normal caliber large and small bowel. No bowel wall thickening. Normal appendix. Stomach is within normal limits. Vascular/Lymphatic: Aortic atherosclerosis. No enlarged abdominal or pelvic lymph nodes. Reproductive: Unremarkable. Other: No free intraperitoneal fluid or air. Musculoskeletal: No acute fracture. IMPRESSION: 1. No acute abnormality in the abdomen or pelvis. 2. Aortic Atherosclerosis (ICD10-I70.0). Electronically Signed   By: Norman Gatlin M.D.   On: 03/11/2024 23:03   DG Swallowing Func-Speech Pathology Result Date: 02/25/2024 Table formatting from the original result was not included. Modified Barium Swallow Study Patient Details Name: Jerrol Helmers MRN:  [id number] Date of Birth: [birth year] Today's Date: 02/25/2024 HPI/PMH: HPI: Croy Drumwright is a 78 yo male with PMH including Parkinson's disease, CVA, GERD, carotid artery stenosis, chronic headaches who presents 7/29 with AMS and erratic behavior. Emergently intubated 7/30-7/31 and 8/1-8/2, thought to be secondary to medication-induced hypoxia. Pt passed the swallow screen after both extubations. Seen by SLP 01/07/24 with recommendations to continue regular diet with thin liquids. Clinical Impression: Clinical Impression: Pt presents with an overall functional oropharyngeal swallow. He has deficits related to coordination and strength which are suspected to be related to Parkinson's disease, for which he compensates well without significant residue or penetration/aspiration. Coughing was noted at the end of the study prior to transferring pt back to bed but no airway invasion or retrograde esophageal backflow was noted with fluoro. Provided education with recommendations to continue current diet. Will sign off at this time but discussed f/u for hypophonia at pt's next venue of care. Factors that may increase risk of adverse event in presence of aspiration Noe & Lianne 2021): Factors that may increase risk of adverse event  in presence of aspiration Noe & Lianne 2021): Poor general health and/or compromised immunity; Reduced cognitive function; Weak cough Recommendations/Plan: Swallowing Evaluation Recommendations Swallowing Evaluation Recommendations Recommendations: PO diet PO Diet Recommendation: Regular; Thin liquids (Level 0) Liquid Administration via: Cup; Straw Medication Administration: Whole meds with liquid Supervision: Patient able to self-feed Swallowing strategies  : Minimize environmental distractions; Slow rate; Small bites/sips Postural changes: Position pt fully upright for meals; Stay upright 30-60 min after meals Oral care recommendations: Oral care BID (2x/day) Treatment Plan Treatment Plan  Treatment recommendations: No treatment recommended at this time Follow-up recommendations: Skilled nursing-short term rehab (<3 hours/day) Functional status assessment: Patient has not had a recent decline in their functional status. Recommendations Recommendations for follow up therapy are one component of a multi-disciplinary discharge planning process, led by the attending physician.  Recommendations may be updated based on patient status, additional functional criteria and insurance authorization. Assessment: Orofacial Exam: Orofacial Exam Oral Cavity: Oral Hygiene: WFL Oral Cavity - Dentition: Adequate natural dentition Orofacial Anatomy: WFL Oral Motor/Sensory Function: WFL Anatomy: Anatomy: Suspected cervical osteophytes; Prominent cricopharyngeus Boluses Administered: Boluses Administered Boluses Administered: Thin liquids (Level 0); Mildly thick liquids (Level 2, nectar thick); Moderately thick liquids (Level 3, honey thick); Puree; Solid  Oral Impairment Domain: Oral Impairment Domain Lip Closure: Escape beyond mid-chin Tongue control during bolus hold: Cohesive bolus between tongue to palatal seal Bolus preparation/mastication: Timely and efficient chewing and mashing Bolus transport/lingual motion: Brisk tongue motion Oral residue: Complete oral clearance Location of oral residue : N/A Initiation of pharyngeal swallow : Pyriform sinuses  Pharyngeal Impairment Domain: Pharyngeal Impairment Domain Soft palate elevation: No bolus between soft palate (SP)/pharyngeal wall (PW) Laryngeal elevation: Complete superior movement of thyroid  cartilage with complete approximation of arytenoids to epiglottic petiole Anterior hyoid excursion: Complete anterior movement Epiglottic movement: Complete inversion Laryngeal vestibule closure: Complete, no air/contrast in laryngeal vestibule Pharyngeal stripping wave : Present - complete Pharyngeal contraction (A/P view only): N/A Pharyngoesophageal segment opening: Partial  distention/partial duration, partial obstruction of flow Tongue base retraction: No contrast between tongue base and posterior pharyngeal wall (PPW) Pharyngeal residue: Complete pharyngeal clearance Location of pharyngeal residue: N/A  Esophageal Impairment Domain: Esophageal Impairment Domain Esophageal clearance upright position: Complete clearance, esophageal coating Pill: Pill Consistency administered: Thin liquids (Level 0) Thin liquids (Level 0): [facility] Penetration/Aspiration Scale Score: Penetration/Aspiration Scale Score 1.  Material does not enter airway: Thin liquids (Level 0); Mildly thick liquids (Level 2, nectar thick); Moderately thick liquids (Level 3, honey thick); Puree; Solid; Pill Compensatory Strategies: Compensatory Strategies Compensatory strategies: No   General Information: Caregiver present: No  Diet Prior to this Study: Regular; Thin liquids (Level 0)   Temperature : Normal   Respiratory Status: WFL   Supplemental O2: None (Room air)   History of Recent Intubation: Yes  Behavior/Cognition: Alert; Cooperative; Pleasant mood Self-Feeding Abilities: Able to self-feed Baseline vocal quality/speech: Hypophonia/low volume Volitional Cough: Able to elicit Volitional Swallow: Able to elicit Exam Limitations: No limitations Goal Planning: Prognosis for improved oropharyngeal function: Good Barriers to Reach Goals: Cognitive deficits No data recorded Patient/Family Stated Goal: none stated Consulted and agree with results and recommendations: Patient Pain: Pain Assessment Pain Assessment: No/denies pain End of Session: Start Time:SLP Start Time (ACUTE ONLY): 1257 Stop Time: SLP Stop Time (ACUTE ONLY): 1323 Time Calculation:SLP Time Calculation (min) (ACUTE ONLY): 26 min Charges: SLP Evaluations $ SLP Speech Visit: 1 Visit SLP Evaluations $BSS Swallow: 1 Procedure $MBS Swallow: 1 Procedure SLP visit diagnosis: SLP Visit Diagnosis: Dysphagia, oropharyngeal  phase (R13.12) Past Medical History: Past Medical  History: Diagnosis Date  Carotid artery stenosis 04/29/2018  Chronic tension headaches 04/29/2018  Essential hypertension 04/29/2018  GERD (gastroesophageal reflux disease) 04/29/2018  HLD (hyperlipidemia) 04/29/2018  Parkinson's disease (HCC) 04/29/2018 Past Surgical History: Past Surgical History: Procedure Laterality Date  BIOPSY  01/09/2022  Procedure: BIOPSY;  Surgeon: Wilhelmenia Aloha Raddle., MD;  Location: [facility];  Service: Gastroenterology;;  EGD and COLON  COLONOSCOPY WITH PROPOFOL  N/A 01/09/2022  Procedure: COLONOSCOPY WITH PROPOFOL ;  Surgeon: Wilhelmenia Aloha Raddle., MD;  Location: [facility];  Service: Gastroenterology;  Laterality: N/A;  ENDARTERECTOMY Left 04/27/2021  Procedure: LEFT CAROTID ENDARTERECTOMY;  Surgeon: Serene Gaile ORN, MD;  [facility];  Service: Vascular;  Laterality: Left;  ESOPHAGOGASTRODUODENOSCOPY (EGD) WITH PROPOFOL  N/A 01/09/2022  Procedure: ESOPHAGOGASTRODUODENOSCOPY (EGD) WITH PROPOFOL ;  Surgeon: Wilhelmenia Aloha Raddle., MD;  Location: [facility];  Service: Gastroenterology;  Laterality: N/A;  LEFT HEART CATH AND CORONARY ANGIOGRAPHY N/A 11/25/2019  Procedure: LEFT HEART CATH AND CORONARY ANGIOGRAPHY;  Surgeon: Claudene Victory ORN, MD;  Location: [facility];  Service: Cardiovascular;  Laterality: N/A;  PATCH ANGIOPLASTY Left 04/27/2021  Procedure: PATCH ANGIOPLASTY USING GEORGE BIOLOGIC PATCH;  Surgeon: Serene Gaile ORN, MD;  Location: [facility] OR;  Service: Vascular;  Laterality: Left;  TONSILLECTOMY   Damien Blumenthal, M.A., CCC-SLP Speech Language Pathology, Acute Rehabilitation Services Secure Chat preferred [phone] 02/25/2024, 2:01 PM  DG CHEST PORT 1 VIEW Result Date: 02/23/2024 EXAM: 1 VIEW XRAY OF THE CHEST 02/23/2024 12:10:59 AM COMPARISON: 02/21/2024 CLINICAL HISTORY: Respiratory arrest (HCC) 10236. Resp arrest FINDINGS: LUNGS AND PLEURA: The lungs are clear. No focal pulmonary opacity. No pulmonary edema. No pleural effusion. No pneumothorax. HEART AND MEDIASTINUM:  Stable cardiomediastinal silhouette. No acute abnormality of the cardiac and mediastinal silhouettes. BONES AND SOFT TISSUES: No acute osseous abnormality. Defibrillator pads overlie the chest. Loop recorder. LINES AND TUBES: Endotracheal tube tip in the upper intrathoracic trachea 7.3 cm from the carina. IMPRESSION: 1. No acute process. 2. Endotracheal tube tip in the upper intrathoracic trachea, 7.3 cm from the carina. Electronically signed by: Norman Gatlin MD 02/23/2024 12:16 AM EDT RP Workstation: [id number]   DG Chest Port 1 View Result Date: 02/21/2024 EXAM: 1 VIEW XRAY OF THE CHEST 02/21/2024 06:04:00 AM COMPARISON: 02/20/2024 CLINICAL HISTORY: Tube. Encounter for tube. FINDINGS: LUNGS AND PLEURA: No focal pulmonary opacity. No pulmonary edema. No pleural effusion. No pneumothorax. HEART AND MEDIASTINUM: No acute abnormality of the cardiac and mediastinal silhouettes. Loop recorder is noted in the projection of the left side of the heart. BONES AND SOFT TISSUES: No acute osseous abnormality. LINES AND TUBES: The ET tube tip is 4.9 cm above the carina. There is an enteric tube with sidebend below the level of the GE junction. IMPRESSION: 1. No acute cardiopulmonary pathology. 2. ET tube and enteric tube in appropriate position. Loop recorder in the projection of the left side of the heart. Electronically signed by: Waddell Calk MD 02/21/2024 06:17 AM EDT RP Workstation: [id number]   DG Abd 1 View Result Date: 02/20/2024 CLINICAL DATA:  Evaluate for orogastric tube placement EXAM: ABDOMEN - 1 VIEW COMPARISON:  None Available. FINDINGS: Enteric tube in place, tip terminates overlying the gastric cardia . Visualized lower chest is unremarkable. The bowel gas pattern is normal. No radio-opaque calculi or other significant radiographic abnormality are seen. IMPRESSION: Proper position of orogastric tube. Electronically Signed   By: Megan  Zare M.D.   On: 02/20/2024 12:27   DG Chest Portable 1  View Result  Date: 02/20/2024 CLINICAL DATA:  Endotracheal tube placement. EXAM: PORTABLE CHEST 1 VIEW COMPARISON:  10/10/2023 FINDINGS: Endotracheal tube has tip 6.2 cm above the carina. Enteric tube courses into the region of the stomach and off the film as tip is not visualized although side-port is over the stomach in the left upper quadrant. Lungs are adequately inflated with hazy prominence of the central pulmonary vessels suggesting mild vascular congestion/edema. No evidence of effusion or pneumothorax. Cardiomediastinal silhouette and remainder of the exam is unchanged. IMPRESSION: 1. Endotracheal tube has tip 6.2 cm above the carina. 2. Mild vascular congestion/edema. Electronically Signed   By: Toribio Agreste M.D.   On: 02/20/2024 08:14   CT Head Wo Contrast Result Date: 02/19/2024 CLINICAL DATA:  Provided history: Delirium. EXAM: CT HEAD WITHOUT CONTRAST TECHNIQUE: Contiguous axial images were obtained from the base of the skull through the vertex without intravenous contrast. RADIATION DOSE REDUCTION: This exam was performed according to the departmental dose-optimization program which includes automated exposure control, adjustment of the mA and/or kV according to patient size and/or use of iterative reconstruction technique. COMPARISON:  CT angiogram head/neck 01/07/2024. FINDINGS: Motion degraded examination. Within this limitation, findings are as follows. Brain: Generalized cerebral atrophy. There is no acute intracranial hemorrhage. No demarcated cortical infarct. No extra-axial fluid collection. No evidence of an intracranial mass. No midline shift. Vascular: No hyperdense vessel. Atherosclerotic calcifications. Skull: No calvarial fracture or aggressive osseous lesion. Sinuses/Orbits: No mass or acute finding within the imaged orbits. No significant paranasal sinus disease. Other: Trace fluid within left mastoid air cells. IMPRESSION: 1. Motion degraded examination. Within this limitation, no  acute intracranial abnormality is identified. 2. Generalized cerebral atrophy. 3. Trace left mastoid effusion. Electronically Signed   By: Rockey Childs D.O.   On: 02/19/2024 18:23   CT Angio Chest/Abd/Pel for Dissection W and/or Wo Contrast Result Date: 02/19/2024 CLINICAL DATA:  Acute aortic syndrome suspected. Patient has hyperventilating with erratic behavior. EXAM: CT ANGIOGRAPHY CHEST, ABDOMEN AND PELVIS TECHNIQUE: Non-contrast CT of the chest was initially obtained. Multidetector CT imaging through the chest, abdomen and pelvis was performed using the standard protocol during bolus administration of intravenous contrast. Multiplanar reconstructed images and MIPs were obtained and reviewed to evaluate the vascular anatomy. RADIATION DOSE REDUCTION: This exam was performed according to the departmental dose-optimization program which includes automated exposure control, adjustment of the mA and/or kV according to patient size and/or use of iterative reconstruction technique. CONTRAST:  75mL OMNIPAQUE  IOHEXOL  350 MG/ML SOLN COMPARISON:  Chest radiograph 10/10/2023. CT abdomen and pelvis 01/07/2024. CTA abdomen and pelvis 07/19/2021 FINDINGS: CTA CHEST FINDINGS Cardiovascular: Unenhanced images of the chest demonstrate calcification in the aorta and coronary arteries. No evidence of acute intramural hematoma. Images obtained during the arterial phase after contrast material administration demonstrate normal caliber thoracic aorta. Motion artifact is present but there is no evidence of aortic dissection. Great vessel origins are patent. Normal heart size. No pericardial effusions. Central pulmonary arteries appear patent without evidence of central pulmonary embolus. Mediastinum/Nodes: No enlarged mediastinal, hilar, or axillary lymph nodes. Thyroid  gland, trachea, and esophagus demonstrate no significant findings. Loop recorder in the left anterior chest wall. Lungs/Pleura: Calcified granuloma in the lingula.  Lungs are otherwise clear. No pleural effusion or pneumothorax. Musculoskeletal: Degenerative changes in the spine. No acute bony abnormalities are seen. Motion artifact limits evaluation. Review of the MIP images confirms the above findings. CTA ABDOMEN AND PELVIS FINDINGS VASCULAR Aorta: Normal caliber aorta without aneurysm, dissection, vasculitis or significant stenosis. Calcification of  the aorta. Celiac: Motion artifact limits evaluation but the skin back axis appears patent despite calcification at the origin. SMA: Motion artifact limits evaluation but the superior mesenteric artery appears patent despite calcification at the origin. Renals: Duplicated right and single left renal arteries appear patent. Calcification at the origin. IMA: Patent without evidence of aneurysm, dissection, vasculitis or significant stenosis. Inflow: Patent without evidence of aneurysm, dissection, vasculitis or significant stenosis. Veins: No obvious venous abnormality within the limitations of this arterial phase study. Review of the MIP images confirms the above findings. NON-VASCULAR Hepatobiliary: No focal liver abnormality is seen. No gallstones, gallbladder wall thickening, or biliary dilatation. Pancreas: Unremarkable. No pancreatic ductal dilatation or surrounding inflammatory changes. Spleen: Normal in size without focal abnormality. Adrenals/Urinary Tract: Adrenal glands are unremarkable. Kidneys are normal, without renal calculi, focal lesion, or hydronephrosis. Bladder is unremarkable. Stomach/Bowel: Stomach, small bowel, and colon are not abnormally distended. No wall thickening or inflammatory changes are seen. Appendix is not seen. Lymphatic: No significant lymphadenopathy. Reproductive: Prostate is unremarkable. Other: No free air or free fluid in the abdomen. Abdominal wall musculature appears intact. Musculoskeletal: Degenerative changes in the spine. No acute bony abnormalities. Review of the MIP images confirms  the above findings. IMPRESSION: 1. Motion artifact limits examination. 2. No evidence of aneurysm, dissection, or significant stenosis in the thoracic or abdominal aorta. Aortic atherosclerosis. 3. No significant central pulmonary embolus is identified. 4. No evidence of active pulmonary disease. 5. No acute process demonstrated in the abdomen or pelvis. Electronically Signed   By: Elsie Gravely M.D.   On: 02/19/2024 18:13     Subjective: No acute issues or events overnight   Discharge Exam: Vitals:   03/13/24 0600 03/13/24 1140  BP: (!) 166/73 (!) 176/68  Pulse: 69 67  Resp: 19 18  Temp: 98.3 F (36.8 C) 98 F (36.7 C)  SpO2: 96% 97%   Vitals:   03/12/24 2142 03/13/24 0004 03/13/24 0600 03/13/24 1140  BP: (!) 172/78 (!) 117/55 (!) 166/73 (!) 176/68  Pulse: 67 63 69 67  Resp: 20 20 19 18   Temp: (!) 97.5 F (36.4 C) 98 F (36.7 C) 98.3 F (36.8 C) 98 F (36.7 C)  TempSrc: Oral Oral Oral Oral  SpO2: 96% 96% 96% 97%  Weight:      Height:        General: Pt is alert, awake, not in acute distress Cardiovascular: RRR, S1/S2 +, no rubs, no gallops Respiratory: CTA bilaterally, no wheezing, no rhonchi Abdominal: Soft, NT, ND, bowel sounds + Extremities: no edema, no cyanosis    The results of significant diagnostics from this hospitalization (including imaging, microbiology, ancillary and laboratory) are listed below for reference.     Microbiology: No results found for this or any previous visit (from the past 240 hours).   Labs: BNP (last 3 results) No results for input(s): BNP in the last 8760 hours. Basic Metabolic Panel: Recent Labs  Lab 03/11/24 1753 03/12/24 0203  NA 138 138  K 3.4* 3.8  CL 108 106  CO2 20* 24  GLUCOSE 92 98  BUN 14 11  CREATININE 0.79 0.75  CALCIUM  8.2* 8.4*   Liver Function Tests: Recent Labs  Lab 03/11/24 1753 03/12/24 0203  AST 23 17  ALT 5 <5  ALKPHOS 53 51  BILITOT 0.7 0.7  PROT 5.9* 6.0*  ALBUMIN 3.2* 3.2*   No  results for input(s): LIPASE, AMYLASE in the last 168 hours. No results for input(s): AMMONIA  in the last  168 hours. CBC: Recent Labs  Lab 03/11/24 1753 03/12/24 0203  WBC 8.4 7.7  NEUTROABS 5.0  --   HGB 10.8* 10.6*  HCT 33.7* 32.6*  MCV 89.2 88.3  PLT 253 260   Cardiac Enzymes: Recent Labs  Lab 03/11/24 1753  CKTOTAL 92   Urinalysis    Component Value Date/Time   COLORURINE AMBER (A) 03/11/2024 1829   APPEARANCEUR HAZY (A) 03/11/2024 1829   LABSPEC 1.019 03/11/2024 1829   PHURINE 5.0 03/11/2024 1829   GLUCOSEU NEGATIVE 03/11/2024 1829   HGBUR NEGATIVE 03/11/2024 1829   BILIRUBINUR NEGATIVE 03/11/2024 1829   KETONESUR 5 (A) 03/11/2024 1829   PROTEINUR NEGATIVE 03/11/2024 1829   NITRITE NEGATIVE 03/11/2024 1829   LEUKOCYTESUR NEGATIVE 03/11/2024 1829   Sepsis Labs Recent Labs  Lab 03/11/24 1753 03/12/24 0203  WBC 8.4 7.7   Microbiology No results found for this or any previous visit (from the past 240 hours).   Time coordinating discharge: Over 30 minutes  SIGNED:   Elsie JAYSON Mont[place], DO [facility] 03/13/2024, 11:59 AM Pager   If 7PM-7AM, please contact night-coverage [email or web address]

## 2024-03-13 NOTE — Care Management Obs Status (Signed)
 MEDICARE OBSERVATION STATUS NOTIFICATION   Patient Details  Name: Terry [name] MRN: [id number] Date of Birth: 10-22-1945   Medicare Observation Status Notification Given:  Yes  03/12/2024 Verbally reviewed observation notice with [name] telephonically at [phone].   [name] 03/13/2024, 9:53 AM

## 2024-03-13 NOTE — TOC Progression Note (Addendum)
 Transition of Care [facility]) - Progression Note    Patient Details  Name: Terry [name] MRN: [id number] Date of Birth: 03-25-1946  Transition of Care [facility]) CM/SW Contact  Kazuto Sevey A [place], LCSW Phone Number: 03/13/2024, 12:26 PM  Clinical Narrative:      Update 1622 Auth remains pending for [facility]. Pt to DC whenever shara is approved. Pt's wife [name] notified. MD notified.   CSW was notified that pt's wife [name] does not feel she can take pt home and care for him currently. CSW met with pt and [name] at bedside, pt was agreeable to [facility] at DC.   CSW started [employer] authorization for placement. Auth pending. Reference ID: [id number]  Whitney at [facility] notified CSW that pt can come today if authorization gets approved.   Room # 101 Number for report: [phone].   MD and nursing notified.   CSW will continue to follow.                     Expected Discharge Plan and Services         Expected Discharge Date: 03/13/24                                     Social Drivers of Health (SDOH) Interventions SDOH Screenings   Food Insecurity: No Food Insecurity (03/12/2024)  Housing: Low Risk  (03/12/2024)  Transportation Needs: No Transportation Needs (03/12/2024)  Utilities: Not At Risk (03/12/2024)  Depression (PHQ2-9): Medium Risk (12/10/2019)  Social Connections: Socially Integrated (03/12/2024)  Tobacco Use: Medium Risk (03/12/2024)    Readmission Risk Interventions     No data to display

## 2024-03-13 NOTE — Plan of Care (Addendum)
 Patient calm and cooperative A&O 2-3. Patient diastolic BP below normal value, hospitalist notified, new orders placed. Patient educated on bolus dose. Patient reminded to microshift in bed, self bought brief changed, bath given with full linens change. Patient left with call bell in reach, side rails up and bed in lowest position.    Problem: Education: Goal: Knowledge of General Education information will improve Description: Including pain rating scale, medication(s)/side effects and non-pharmacologic comfort measures Outcome: Progressing   Problem: Health Behavior/Discharge Planning: Goal: Ability to manage health-related needs will improve Outcome: Progressing   Problem: Clinical Measurements: Goal: Ability to maintain clinical measurements within normal limits will improve Outcome: Progressing Goal: Will remain free from infection Outcome: Progressing   Problem: Activity: Goal: Risk for activity intolerance will decrease Outcome: Progressing   Problem: Coping: Goal: Level of anxiety will decrease Outcome: Progressing   Problem: Elimination: Goal: Will not experience complications related to bowel motility Outcome: Progressing Goal: Will not experience complications related to urinary retention Outcome: Progressing   Problem: Pain Managment: Goal: General experience of comfort will improve and/or be controlled Outcome: Progressing   Problem: Safety: Goal: Ability to remain free from injury will improve Outcome: Progressing

## 2024-03-14 DIAGNOSIS — R410 Disorientation, unspecified: Secondary | ICD-10-CM | POA: Diagnosis not present

## 2024-03-14 MED ORDER — ROTIGOTINE 2 MG/24HR TD PT24: 1.0000 | MEDICATED_PATCH | Freq: Every day | TRANSDERMAL | 2 refills | Status: DC

## 2024-03-14 MED ORDER — CARBIDOPA-LEVODOPA ER 50-200 MG PO TBCR: 1.0000 | EXTENDED_RELEASE_TABLET | Freq: Every day | ORAL | 0 refills | Status: DC

## 2024-03-14 MED ORDER — CARBIDOPA-LEVODOPA 25-100 MG PO TABS: 2.0000 | ORAL_TABLET | ORAL | 1 refills | Status: DC

## 2024-03-14 MED ORDER — QUETIAPINE FUMARATE 25 MG PO TABS: 25.0000 mg | ORAL_TABLET | Freq: Every evening | ORAL | 0 refills | Status: DC | PRN

## 2024-03-14 MED ORDER — POLYETHYLENE GLYCOL 3350 17 GM/SCOOP PO POWD: 17.0000 g | Freq: Two times a day (BID) | ORAL | 0 refills | Status: DC

## 2024-03-14 NOTE — TOC Progression Note (Signed)
 Transition of Care [facility]) - Progression Note    Patient Details  Name: Marchello Rothgeb MRN: [id number] Date of Birth: 07-May-1946  [facility] of [facility]) CM/SW Contact  Davionne Mastrangelo A [place], LCSW Phone Number: 03/14/2024, 9:53 AM  Clinical Narrative:     Shara BALBOA [id number] Reference ID [id number]  Approval Dates 03/13/2024-03/17/2024   Likely DC to [facility] today. MD notified.                     Expected Discharge Plan and Services         Expected Discharge Date: 03/13/24                                     Social Drivers of Health (SDOH) Interventions SDOH Screenings   Food Insecurity: No Food Insecurity (03/12/2024)  Housing: Low Risk  (03/12/2024)  Transportation Needs: No Transportation Needs (03/12/2024)  Utilities: Not At Risk (03/12/2024)  Depression (PHQ2-9): Medium Risk (12/10/2019)  Social Connections: Socially Integrated (03/12/2024)  Tobacco Use: Medium Risk (03/12/2024)    Readmission Risk Interventions     No data to display

## 2024-03-14 NOTE — TOC Transition Note (Signed)
 Transition of Care [facility]) - Discharge Note   Patient Details  Name: Terry [name] MRN: [id number] Date of Birth: 1946-06-26  Transition of Care [facility]) CM/SW Contact:  Gittel Mccamish A [place], LCSW Phone Number: 03/14/2024, 12:32 PM   Clinical Narrative:       Final next level of care: Skilled Nursing Facility Barriers to Discharge: Barriers Resolved  Patient will DC to: [facility]  Anticipated DC date: 03/14/24  Family notified: [name], pt's spouse  Transport by: [facility]      Per MD patient ready for DC to [facility]. RN, patient, patient's family, and facility notified of DC. Discharge Summary and FL2 sent to facility. RN to call report prior to discharge (Room 101, [phone]). DC packet on chart. Ambulance transport requested for patient.     CSW will sign off for now as social work intervention is no longer needed. Please consult us  again if new needs arise.  Patient Goals and CMS Choice Patient states their goals for this hospitalization and ongoing recovery are:: Rehab CMS Medicare.gov Compare Post Acute Care list provided to:: Patient        Discharge Placement              Patient chooses bed at: [facility] Patient to be transferred to facility by: [facility] Name of family member notified: spouse,[name] Patient and family notified of of transfer: 03/14/24  Discharge Plan and Services Additional resources added to the After Visit Summary for                                       Social Drivers of Health (SDOH) Interventions SDOH Screenings   Food Insecurity: No Food Insecurity (03/12/2024)  Housing: Low Risk  (03/12/2024)  Transportation Needs: No Transportation Needs (03/12/2024)  Utilities: Not At Risk (03/12/2024)  Depression (PHQ2-9): Medium Risk (12/10/2019)  Social Connections: Socially Integrated (03/12/2024)  Tobacco Use: Medium Risk (03/12/2024)     Readmission Risk Interventions     No data to display

## 2024-03-14 NOTE — Discharge Summary (Signed)
 Physician Discharge Summary  Terry [name] FMW:[id number] DOB: [birth year] DOA: 03/11/2024  PCP: Burney Darice CROME, MD  Admit date: 03/11/2024 Discharge date: 03/14/2024  Admitted From: Home Disposition: Home health PT  Recommendations for Outpatient Follow-up:  Follow up with PCP in 1-2 weeks  Home Health: PT/OT Equipment/Devices: No new equipment  Discharge Condition: Stable CODE STATUS: Full Diet recommendation: Regular diet as tolerated  Brief/Interim Summary: Terry [name] is a 78 y.o. male with history of Parkinson's disease, carotid stenosis, hypertension who presents with erratic behavior and movement from home.  Patient's medications were adjusted with assistance from neurology and he is otherwise at this point stable and agreeable for discharge.  Plan at this point is discharge home with home health PT OT where he previously resided.  No acute infection or other etiology that would explain patient's behavior changes other than medication changes, there is concern for noncompliance or confusion given recent changes.  We have confirmed medication dosing as below and would recommend very strongly patient continue this regimen until outpatient follow-up.  At the time of discharge on 8/21 patient's family indicates he is now unsafe for discharge home, case management and physical therapy to reevaluate for potential transfer to skilled facility.  He remains medically stable for discharge awaiting safe disposition. He is, as of now, now accepted/approved to discharge to SNF -family notified.  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   Chronic anemia limit lab draws unless necessary, near baseline   History of carotid stenosis on statins.   Discharge Instructions  Discharge Instructions     Call MD for:  difficulty breathing, headache or visual disturbances   Complete by: As directed    Call MD for:  extreme fatigue   Complete by: As directed    Call MD for:  hives   Complete by: As directed    Call MD for:  persistant dizziness or light-headedness   Complete by: As directed    Call MD for:  persistant nausea and vomiting   Complete by: As directed    Call MD for:  severe uncontrolled pain   Complete by: As directed    Call MD for:  temperature >100.4   Complete by: As directed    Diet - low sodium heart healthy   Complete by: As directed    Increase activity slowly   Complete by: As directed    No wound care   Complete by: As directed       Allergies as of 03/14/2024       Reactions   Erythromycin Rash, Dermatitis, Other (See Comments)   Other Reaction(s): Unknown Stomach Cramps erythromycin   Haldol  [haloperidol ]    Haldol  caused bradycardia and respiratory arrest   Penicillins Rash, Dermatitis, Hives, Other (See Comments)   Other Reaction(s): Unknown Product containing penicillin (  product)   Azithromycin Other (See Comments)   GI upset azithromycin   Clam Shell Nausea And Vomiting   Oyster Extract Nausea And Vomiting, Other (See Comments)   clam allergenic extract   Haloperidol  And Related Other  (See Comments)   Respiratory arrest requiring intubation twice.         Medication List     STOP taking these medications    Carbidopa -Levodopa  ER 70-280 MG Cpcr Replaced by: carbidopa -levodopa  25-100 MG tablet You also have another medication with the same name that you need to continue taking as instructed.   melatonin 5 MG Tabs   Neupro  1 MG/24HR Pt24 Generic drug: Rotigotine  Replaced by: rotigotine  2 MG/24HR   ondansetron  4 MG tablet Commonly known as: ZOFRAN    pantoprazole  40 MG tablet Commonly known as: PROTONIX    traZODone  50 MG tablet Commonly known as: DESYREL        TAKE these medications    acetaminophen  325 MG tablet Commonly known as: TYLENOL  Take 325 mg by mouth daily as needed for mild pain (pain score 1-3).   aspirin  EC 81 MG tablet Take 1 tablet (81 mg total) by mouth daily. Swallow whole.   atorvastatin  20 MG tablet Commonly known as: LIPITOR TAKE ONE TABLET BY MOUTH EVERY NIGHT AT BEDTIME   brimonidine -timolol  0.2-0.5 % ophthalmic solution Commonly known as: COMBIGAN  Apply 1 drop to eye 2 (two) times daily.   carbidopa -levodopa  50-200 MG tablet Commonly known as: SINEMET  CR Take 1 tablet by mouth at bedtime. What changed:  Another medication with the same name was added. Make sure you understand how and when to take each. Another medication with the same name was removed. Continue taking this medication, and follow the directions you see here.   carbidopa -levodopa  25-100 MG tablet Commonly known as: SINEMET  IR Take 2 tablets by mouth every 3 (three) hours while awake. From 0600 through 2100 (no need for midnight/0300 dose) What changed: You were already taking a medication with the same name, and this prescription was added. Make sure you understand how and when to take each. Replaces: Carbidopa -Levodopa  ER 70-280 MG Cpcr   carbidopa -levodopa  50-200 MG tablet Commonly known as: SINEMET  CR Take 1 tablet by mouth at bedtime. What  changed: You were already taking a medication with the same name, and this prescription was added. Make sure you understand how and when to take each.   feeding supplement Liqd Take 237 mLs by mouth 2 (two) times daily between meals.   hydrALAZINE  50 MG tablet Commonly known as: APRESOLINE  TAKE 1 TABLET(50 MG) BY MOUTH TWICE DAILY What changed: See the new instructions.   M-Natal Plus 27-1 MG Tabs Take 1 tablet by mouth daily.   meloxicam  7.5 MG tablet Commonly known as: MOBIC  Take 7.5 mg by mouth 2 (two) times daily as needed.   olopatadine  0.1 % ophthalmic solution Commonly known as: PATANOL Place 1 drop into both eyes daily as needed for allergies.   polyethylene glycol powder 17 GM/SCOOP powder Commonly known as: GLYCOLAX /MIRALAX  Take 17 g by mouth 2 (two) times daily.   QUEtiapine  25 MG tablet Commonly known as: SEROQUEL  Take 1 tablet (25 mg total) by mouth at bedtime as needed (Agitation.). What changed:  when to take this reasons to take this   rotigotine  2 MG/24HR Commonly known as: NEUPRO  Place 1 patch onto the skin daily. Replaces: Neupro  1 MG/24HR Pt24   triamcinolone ointment 0.1 % Commonly known as: KENALOG Apply topically 3 (three) times daily.   VITAMIN B-12  IJ Inject 1,000 mcg as directed every 30 (thirty) days.   Vitamin D (Ergocalciferol) 1.25 MG (50000 UNIT) Caps capsule Commonly known as: DRISDOL Take 50,000 Units by mouth every Tuesday.        Contact information for follow-up providers     [facility] Follow up.   Why: [facility] will provide home health PT/OT.  They will call you in the next 24-48 hours to set up services. Contact information: [address] [place] [address] [phone]              Contact information for after-discharge care     Destination     [facility] .   Service: Skilled Nursing Contact information: [address] [place]  [address] [phone]                     Allergies  Allergen Reactions   Erythromycin Rash, Dermatitis and Other (See Comments)    Other Reaction(s): Unknown  Stomach Cramps  erythromycin   Haldol  [Haloperidol ]     Haldol  caused bradycardia and respiratory arrest   Penicillins Rash, Dermatitis, Hives and Other (See Comments)    Other Reaction(s): Unknown  Product containing penicillin (product)   Azithromycin Other (See Comments)    GI upset  azithromycin   Clam Shell Nausea And Vomiting   Oyster Extract Nausea And Vomiting and Other (See Comments)    clam allergenic extract   Haloperidol  And Related Other (See Comments)    Respiratory arrest requiring intubation twice.     Consultations: Neurology  Procedures/Studies: CT ABDOMEN PELVIS WO CONTRAST Result Date: 03/11/2024 CLINICAL DATA:  Dark stool and abdominal pain EXAM: CT ABDOMEN AND PELVIS WITHOUT CONTRAST TECHNIQUE: Multidetector CT imaging of the abdomen and pelvis was performed following the standard protocol without IV contrast. RADIATION DOSE REDUCTION: This exam was performed according to the departmental dose-optimization program which includes automated exposure control, adjustment of the mA and/or kV according to patient size and/or use of iterative reconstruction technique. COMPARISON:  CT 02/19/2024 FINDINGS: Lower chest: No acute abnormality. Hepatobiliary: Unremarkable noncontrast appearance of the liver, gallbladder, and biliary tree. Pancreas: Unremarkable. Spleen: Unremarkable. Adrenals/Urinary Tract: Normal adrenal glands. No urinary calculi or hydronephrosis. Unremarkable bladder. Stomach/Bowel: Normal caliber large and small bowel. No bowel wall thickening. Normal appendix. Stomach is within normal limits. Vascular/Lymphatic: Aortic atherosclerosis. No enlarged abdominal or pelvic lymph nodes. Reproductive: Unremarkable. Other: No free intraperitoneal fluid or air. Musculoskeletal: No acute fracture. IMPRESSION: 1. No acute abnormality in  the abdomen or pelvis. 2. Aortic Atherosclerosis (ICD10-I70.0). Electronically Signed   By: Norman Gatlin M.D.   On: 03/11/2024 23:03   DG Swallowing Func-Speech Pathology Result Date: 02/25/2024 Table formatting from the original result was not included. Modified Barium Swallow Study Patient Details Name: Creighton Longley MRN: [id number] Date of Birth: 1945/08/14 Today's Date: 02/25/2024 HPI/PMH: HPI: Derrich Gaby is a 78 yo male with PMH including Parkinson's disease, CVA, GERD, carotid artery stenosis, chronic headaches who presents 7/29 with AMS and erratic behavior. Emergently intubated 7/30-7/31 and 8/1-8/2, thought to be secondary to medication-induced hypoxia. Pt passed the swallow screen after both extubations. Seen by SLP 01/07/24 with recommendations to continue regular diet with thin liquids. Clinical Impression: Clinical Impression: Pt presents with an overall functional oropharyngeal swallow. He has deficits related to coordination and strength which are suspected to be related to Parkinson's disease, for which he compensates well without significant residue or penetration/aspiration. Coughing was noted at the end of the study prior  to transferring pt back to bed but no airway invasion or retrograde esophageal backflow was noted with fluoro. Provided education with recommendations to continue current diet. Will sign off at this time but discussed f/u for hypophonia at pt's next venue of care. Factors that may increase risk of adverse event in presence of aspiration Noe & Lianne 2021): Factors that may increase risk of adverse event in presence of aspiration Noe & Lianne 2021): Poor general health and/or compromised immunity; Reduced cognitive function; Weak cough Recommendations/Plan: Swallowing Evaluation Recommendations Swallowing Evaluation Recommendations Recommendations: PO diet PO Diet Recommendation: Regular; Thin liquids (Level 0) Liquid Administration via: Cup; Straw Medication  Administration: Whole meds with liquid Supervision: Patient able to self-feed Swallowing strategies  : Minimize environmental distractions; Slow rate; Small bites/sips Postural changes: Position pt fully upright for meals; Stay upright 30-60 min after meals Oral care recommendations: Oral care BID (2x/day) Treatment Plan Treatment Plan Treatment recommendations: No treatment recommended at this time Follow-up recommendations: Skilled nursing-short term rehab (<3 hours/day) Functional status assessment: Patient has not had a recent decline in their functional status. Recommendations Recommendations for follow up therapy are one component of a multi-disciplinary discharge planning process, led by the attending physician.  Recommendations may be updated based on patient status, additional functional criteria and insurance authorization. Assessment: Orofacial Exam: Orofacial Exam Oral Cavity: Oral Hygiene: WFL Oral Cavity - Dentition: Adequate natural dentition Orofacial Anatomy: WFL Oral Motor/Sensory Function: WFL Anatomy: Anatomy: Suspected cervical osteophytes; Prominent cricopharyngeus Boluses Administered: Boluses Administered Boluses Administered: Thin liquids (Level 0); Mildly thick liquids (Level 2, nectar thick); Moderately thick liquids (Level 3, honey thick); Puree; Solid  Oral Impairment Domain: Oral Impairment Domain Lip Closure: Escape beyond mid-chin Tongue control during bolus hold: Cohesive bolus between tongue to palatal seal Bolus preparation/mastication: Timely and efficient chewing and mashing Bolus transport/lingual motion: Brisk tongue motion Oral residue: Complete oral clearance Location of oral residue : N/A Initiation of pharyngeal swallow : Pyriform sinuses  Pharyngeal Impairment Domain: Pharyngeal Impairment Domain Soft palate elevation: No bolus between soft palate (SP)/pharyngeal wall (PW) Laryngeal elevation: Complete superior movement of thyroid  cartilage with complete approximation of  arytenoids to epiglottic petiole Anterior hyoid excursion: Complete anterior movement Epiglottic movement: Complete inversion Laryngeal vestibule closure: Complete, no air/contrast in laryngeal vestibule Pharyngeal stripping wave : Present - complete Pharyngeal contraction (A/P view only): N/A Pharyngoesophageal segment opening: Partial distention/partial duration, partial obstruction of flow Tongue base retraction: No contrast between tongue base and posterior pharyngeal wall (PPW) Pharyngeal residue: Complete pharyngeal clearance Location of pharyngeal residue: N/A  Esophageal Impairment Domain: Esophageal Impairment Domain Esophageal clearance upright position: Complete clearance, esophageal coating Pill: Pill Consistency administered: Thin liquids (Level 0) Thin liquids (Level 0): [facility] Penetration/Aspiration Scale Score: Penetration/Aspiration Scale Score 1.  Material does not enter airway: Thin liquids (Level 0); Mildly thick liquids (Level 2, nectar thick); Moderately thick liquids (Level 3, honey thick); Puree; Solid; Pill Compensatory Strategies: Compensatory Strategies Compensatory strategies: No   General Information: Caregiver present: No  Diet Prior to this Study: Regular; Thin liquids (Level 0)   Temperature : Normal   Respiratory Status: WFL   Supplemental O2: None (Room air)   History of Recent Intubation: Yes  Behavior/Cognition: Alert; Cooperative; Pleasant mood Self-Feeding Abilities: Able to self-feed Baseline vocal quality/speech: Hypophonia/low volume Volitional Cough: Able to elicit Volitional Swallow: Able to elicit Exam Limitations: No limitations Goal Planning: Prognosis for improved oropharyngeal function: Good Barriers to Reach Goals: Cognitive deficits No data recorded Patient/Family Stated Goal: none stated Consulted and  agree with results and recommendations: Patient Pain: Pain Assessment Pain Assessment: No/denies pain End of Session: Start Time:SLP Start Time (ACUTE ONLY): 1257 Stop  Time: SLP Stop Time (ACUTE ONLY): 1323 Time Calculation:SLP Time Calculation (min) (ACUTE ONLY): 26 min Charges: SLP Evaluations $ SLP Speech Visit: 1 Visit SLP Evaluations $BSS Swallow: 1 Procedure $MBS Swallow: 1 Procedure SLP visit diagnosis: SLP Visit Diagnosis: Dysphagia, oropharyngeal phase (R13.12) Past Medical History: Past Medical History: Diagnosis Date  Carotid artery stenosis 04/29/2018  Chronic tension headaches 04/29/2018  Essential hypertension 04/29/2018  GERD (gastroesophageal reflux disease) 04/29/2018  HLD (hyperlipidemia) 04/29/2018  Parkinson's disease (HCC) 04/29/2018 Past Surgical History: Past Surgical History: Procedure Laterality Date  BIOPSY  01/09/2022  Procedure: BIOPSY;  Surgeon: Wilhelmenia Aloha Raddle., MD;  Location: [facility];  Service: Gastroenterology;;  EGD and COLON  COLONOSCOPY WITH PROPOFOL  N/A 01/09/2022  Procedure: COLONOSCOPY WITH PROPOFOL ;  Surgeon: Wilhelmenia Aloha Raddle., MD;  Location: [facility];  Service: Gastroenterology;  Laterality: N/A;  ENDARTERECTOMY Left 04/27/2021  Procedure: LEFT CAROTID ENDARTERECTOMY;  Surgeon: Serene Gaile ORN, MD;  Location: [facility];  Service: Vascular;  Laterality: Left;  ESOPHAGOGASTRODUODENOSCOPY (EGD) WITH PROPOFOL  N/A 01/09/2022  Procedure: ESOPHAGOGASTRODUODENOSCOPY (EGD) WITH PROPOFOL ;  Surgeon: Wilhelmenia Aloha Raddle., MD;  Location: [facility];  Service: Gastroenterology;  Laterality: N/A;  LEFT HEART CATH AND CORONARY ANGIOGRAPHY N/A 11/25/2019  Procedure: LEFT HEART CATH AND CORONARY ANGIOGRAPHY;  Surgeon: Claudene Victory ORN, MD;  Location: [facility];  Service: Cardiovascular;  Laterality: N/A;  PATCH ANGIOPLASTY Left 04/27/2021  Procedure: PATCH ANGIOPLASTY USING GEORGE BIOLOGIC PATCH;  Surgeon: Serene Gaile ORN, MD;  Location: [facility];  Service: Vascular;  Laterality: Left;  TONSILLECTOMY   Damien Blumenthal, M.A., CCC-SLP Speech Language Pathology, Acute Rehabilitation Services Secure Chat preferred [phone] 02/25/2024, 2:01  PM  DG CHEST PORT 1 VIEW Result Date: 02/23/2024 EXAM: 1 VIEW XRAY OF THE CHEST 02/23/2024 12:10:59 AM COMPARISON: 02/21/2024 CLINICAL HISTORY: Respiratory arrest (HCC) 10236. Resp arrest FINDINGS: LUNGS AND PLEURA: The lungs are clear. No focal pulmonary opacity. No pulmonary edema. No pleural effusion. No pneumothorax. HEART AND MEDIASTINUM: Stable cardiomediastinal silhouette. No acute abnormality of the cardiac and mediastinal silhouettes. BONES AND SOFT TISSUES: No acute osseous abnormality. Defibrillator pads overlie the chest. Loop recorder. LINES AND TUBES: Endotracheal tube tip in the upper intrathoracic trachea 7.3 cm from the carina. IMPRESSION: 1. No acute process. 2. Endotracheal tube tip in the upper intrathoracic trachea, 7.3 cm from the carina. Electronically signed by: Norman Gatlin MD 02/23/2024 12:16 AM EDT RP Workstation: [id number]   DG Chest Port 1 View Result Date: 02/21/2024 EXAM: 1 VIEW XRAY OF THE CHEST 02/21/2024 06:04:00 AM COMPARISON: 02/20/2024 CLINICAL HISTORY: Tube. Encounter for tube. FINDINGS: LUNGS AND PLEURA: No focal pulmonary opacity. No pulmonary edema. No pleural effusion. No pneumothorax. HEART AND MEDIASTINUM: No acute abnormality of the cardiac and mediastinal silhouettes. Loop recorder is noted in the projection of the left side of the heart. BONES AND SOFT TISSUES: No acute osseous abnormality. LINES AND TUBES: The ET tube tip is 4.9 cm above the carina. There is an enteric tube with sidebend below the level of the GE junction. IMPRESSION: 1. No acute cardiopulmonary pathology. 2. ET tube and enteric tube in appropriate position. Loop recorder in the projection of the left side of the heart. Electronically signed by: Waddell Calk MD 02/21/2024 06:17 AM EDT RP Workstation: [id number]   DG Abd 1 View Result Date: 02/20/2024 CLINICAL DATA:  Evaluate for orogastric tube placement EXAM: ABDOMEN - 1  VIEW COMPARISON:  None Available. FINDINGS: Enteric tube in place,  tip terminates overlying the gastric cardia . Visualized lower chest is unremarkable. The bowel gas pattern is normal. No radio-opaque calculi or other significant radiographic abnormality are seen. IMPRESSION: Proper position of orogastric tube. Electronically Signed   By: Megan  Zare M.D.   On: 02/20/2024 12:27   DG Chest Portable 1 View Result Date: 02/20/2024 CLINICAL DATA:  Endotracheal tube placement. EXAM: PORTABLE CHEST 1 VIEW COMPARISON:  10/10/2023 FINDINGS: Endotracheal tube has tip 6.2 cm above the carina. Enteric tube courses into the region of the stomach and off the film as tip is not visualized although side-port is over the stomach in the left upper quadrant. Lungs are adequately inflated with hazy prominence of the central pulmonary vessels suggesting mild vascular congestion/edema. No evidence of effusion or pneumothorax. Cardiomediastinal silhouette and remainder of the exam is unchanged. IMPRESSION: 1. Endotracheal tube has tip 6.2 cm above the carina. 2. Mild vascular congestion/edema. Electronically Signed   By: Toribio Agreste M.D.   On: 02/20/2024 08:14   CT Head Wo Contrast Result Date: 02/19/2024 CLINICAL DATA:  Provided history: Delirium. EXAM: CT HEAD WITHOUT CONTRAST TECHNIQUE: Contiguous axial images were obtained from the base of the skull through the vertex without intravenous contrast. RADIATION DOSE REDUCTION: This exam was performed according to the departmental dose-optimization program which includes automated exposure control, adjustment of the mA and/or kV according to patient size and/or use of iterative reconstruction technique. COMPARISON:  CT angiogram head/neck 01/07/2024. FINDINGS: Motion degraded examination. Within this limitation, findings are as follows. Brain: Generalized cerebral atrophy. There is no acute intracranial hemorrhage. No demarcated cortical infarct. No extra-axial fluid collection. No evidence of an intracranial mass. No midline shift. Vascular: No  hyperdense vessel. Atherosclerotic calcifications. Skull: No calvarial fracture or aggressive osseous lesion. Sinuses/Orbits: No mass or acute finding within the imaged orbits. No significant paranasal sinus disease. Other: Trace fluid within left mastoid air cells. IMPRESSION: 1. Motion degraded examination. Within this limitation, no acute intracranial abnormality is identified. 2. Generalized cerebral atrophy. 3. Trace left mastoid effusion. Electronically Signed   By: Rockey Childs D.O.   On: 02/19/2024 18:23   CT Angio Chest/Abd/Pel for Dissection W and/or Wo Contrast Result Date: 02/19/2024 CLINICAL DATA:  Acute aortic syndrome suspected. Patient has hyperventilating with erratic behavior. EXAM: CT ANGIOGRAPHY CHEST, ABDOMEN AND PELVIS TECHNIQUE: Non-contrast CT of the chest was initially obtained. Multidetector CT imaging through the chest, abdomen and pelvis was performed using the standard protocol during bolus administration of intravenous contrast. Multiplanar reconstructed images and MIPs were obtained and reviewed to evaluate the vascular anatomy. RADIATION DOSE REDUCTION: This exam was performed according to the departmental dose-optimization program which includes automated exposure control, adjustment of the mA and/or kV according to patient size and/or use of iterative reconstruction technique. CONTRAST:  75mL OMNIPAQUE  IOHEXOL  350 MG/ML SOLN COMPARISON:  Chest radiograph 10/10/2023. CT abdomen and pelvis 01/07/2024. CTA abdomen and pelvis 07/19/2021 FINDINGS: CTA CHEST FINDINGS Cardiovascular: Unenhanced images of the chest demonstrate calcification in the aorta and coronary arteries. No evidence of acute intramural hematoma. Images obtained during the arterial phase after contrast material administration demonstrate normal caliber thoracic aorta. Motion artifact is present but there is no evidence of aortic dissection. Great vessel origins are patent. Normal heart size. No pericardial effusions.  Central pulmonary arteries appear patent without evidence of central pulmonary embolus. Mediastinum/Nodes: No enlarged mediastinal, hilar, or axillary lymph nodes. Thyroid  gland, trachea, and esophagus demonstrate no significant findings.  Loop recorder in the left anterior chest wall. Lungs/Pleura: Calcified granuloma in the lingula. Lungs are otherwise clear. No pleural effusion or pneumothorax. Musculoskeletal: Degenerative changes in the spine. No acute bony abnormalities are seen. Motion artifact limits evaluation. Review of the MIP images confirms the above findings. CTA ABDOMEN AND PELVIS FINDINGS VASCULAR Aorta: Normal caliber aorta without aneurysm, dissection, vasculitis or significant stenosis. Calcification of the aorta. Celiac: Motion artifact limits evaluation but the skin back axis appears patent despite calcification at the origin. SMA: Motion artifact limits evaluation but the superior mesenteric artery appears patent despite calcification at the origin. Renals: Duplicated right and single left renal arteries appear patent. Calcification at the origin. IMA: Patent without evidence of aneurysm, dissection, vasculitis or significant stenosis. Inflow: Patent without evidence of aneurysm, dissection, vasculitis or significant stenosis. Veins: No obvious venous abnormality within the limitations of this arterial phase study. Review of the MIP images confirms the above findings. NON-VASCULAR Hepatobiliary: No focal liver abnormality is seen. No gallstones, gallbladder wall thickening, or biliary dilatation. Pancreas: Unremarkable. No pancreatic ductal dilatation or surrounding inflammatory changes. Spleen: Normal in size without focal abnormality. Adrenals/Urinary Tract: Adrenal glands are unremarkable. Kidneys are normal, without renal calculi, focal lesion, or hydronephrosis. Bladder is unremarkable. Stomach/Bowel: Stomach, small bowel, and colon are not abnormally distended. No wall thickening or  inflammatory changes are seen. Appendix is not seen. Lymphatic: No significant lymphadenopathy. Reproductive: Prostate is unremarkable. Other: No free air or free fluid in the abdomen. Abdominal wall musculature appears intact. Musculoskeletal: Degenerative changes in the spine. No acute bony abnormalities. Review of the MIP images confirms the above findings. IMPRESSION: 1. Motion artifact limits examination. 2. No evidence of aneurysm, dissection, or significant stenosis in the thoracic or abdominal aorta. Aortic atherosclerosis. 3. No significant central pulmonary embolus is identified. 4. No evidence of active pulmonary disease. 5. No acute process demonstrated in the abdomen or pelvis. Electronically Signed   By: Elsie Gravely M.D.   On: 02/19/2024 18:13     Subjective: No acute issues or events overnight   Discharge Exam: Vitals:   03/14/24 0020 03/14/24 0426  BP: (!) 123/54 (!) 155/79  Pulse: 66 68  Resp: 16 16  Temp: 98.2 F (36.8 C) 98.2 F (36.8 C)  SpO2: 96% 92%   Vitals:   03/13/24 1457 03/13/24 1920 03/14/24 0020 03/14/24 0426  BP: (!) 187/77 (!) 179/68 (!) 123/54 (!) 155/79  Pulse: 75 74 66 68  Resp:  16 16 16   Temp:  98.2 F (36.8 C) 98.2 F (36.8 C) 98.2 F (36.8 C)  TempSrc:  Oral Oral Oral  SpO2: 96% 95% 96% 92%  Weight:      Height:        General: Pt is alert, awake, not in acute distress Cardiovascular: RRR, S1/S2 +, no rubs, no gallops Respiratory: CTA bilaterally, no wheezing, no rhonchi Abdominal: Soft, NT, ND, bowel sounds + Extremities: no edema, no cyanosis    The results of significant diagnostics from this hospitalization (including imaging, microbiology, ancillary and laboratory) are listed below for reference.     Microbiology: No results found for this or any previous visit (from the past 240 hours).   Labs: BNP (last 3 results) No results for input(s): BNP in the last 8760 hours. Basic Metabolic Panel: Recent Labs  Lab  03/11/24 1753 03/12/24 0203  NA 138 138  K 3.4* 3.8  CL 108 106  CO2 20* 24  GLUCOSE 92 98  BUN 14 11  CREATININE 0.79  0.75  CALCIUM  8.2* 8.4*   Liver Function Tests: Recent Labs  Lab 03/11/24 1753 03/12/24 0203  AST 23 17  ALT 5 <5  ALKPHOS 53 51  BILITOT 0.7 0.7  PROT 5.9* 6.0*  ALBUMIN 3.2* 3.2*   No results for input(s): LIPASE, AMYLASE in the last 168 hours. No results for input(s): AMMONIA  in the last 168 hours. CBC: Recent Labs  Lab 03/11/24 1753 03/12/24 0203  WBC 8.4 7.7  NEUTROABS 5.0  --   HGB 10.8* 10.6*  HCT 33.7* 32.6*  MCV 89.2 88.3  PLT 253 260   Cardiac Enzymes: Recent Labs  Lab 03/11/24 1753  CKTOTAL 92   Urinalysis    Component Value Date/Time   COLORURINE AMBER (A) 03/11/2024 1829   APPEARANCEUR HAZY (A) 03/11/2024 1829   LABSPEC 1.019 03/11/2024 1829   PHURINE 5.0 03/11/2024 1829   GLUCOSEU NEGATIVE 03/11/2024 1829   HGBUR NEGATIVE 03/11/2024 1829   BILIRUBINUR NEGATIVE 03/11/2024 1829   KETONESUR 5 (A) 03/11/2024 1829   PROTEINUR NEGATIVE 03/11/2024 1829   NITRITE NEGATIVE 03/11/2024 1829   LEUKOCYTESUR NEGATIVE 03/11/2024 1829   Sepsis Labs Recent Labs  Lab 03/11/24 1753 03/12/24 0203  WBC 8.4 7.7   Microbiology No results found for this or any previous visit (from the past 240 hours).   Time coordinating discharge: Over 30 minutes  SIGNED:   Elsie JAYSON [place], DO [facility] 03/14/2024, 6:52 AM Pager   If 7PM-7AM, please contact night-coverage [email or web address]

## 2024-03-14 NOTE — Progress Notes (Signed)
 Mobility Specialist: Progress Note   03/14/24 1000  Mobility  Activity Ambulated with assistance  Level of Assistance Contact guard assist, steadying assist  Assistive Device Front wheel walker  Distance Ambulated (ft) 150 ft  Activity Response Tolerated well  Mobility Referral Yes  Mobility visit 1 Mobility  Mobility Specialist Start Time (ACUTE ONLY) 0840  Mobility Specialist Stop Time (ACUTE ONLY) 0850  Mobility Specialist Time Calculation (min) (ACUTE ONLY) 10 min    Pt received in chair, agreeable to mobility session. CGA throughout. Ambulated with flexed knees throughout session but pt stated this is baseline for him. 1x LOB but able to self-correct. Took 1x seated break d/t fatigue. Otherwise, no complaints. Returned to room without fault. Left in chair with all needs met, call bell in reach. Chair alarm on.  Ileana Lute Mobility Specialist Please contact via SecureChat or Rehab office at [phone]

## 2024-03-14 NOTE — Progress Notes (Signed)
 Physical Therapy Treatment Patient Details Name: Terry [name] MRN: [id number] DOB: [birth year] Today's Date: 03/14/2024   History of Present Illness Pt is a 78 y.o. male who presented 03/11/24 with worsening erratic behavior and bilateral ballismus. Admitted with acute encephalopathy on baseline dementia, unclear if related to polypharmacy or delirium. Of note, pt recently admitted on 02/19/2024 for acute delirium and patient received Valium /Haldol /Seroquel  with improvement but later on became altered hypoxic bradycardic and was intubated and admitted to ICU subsequently extubated but developed respiratory arrest following administration of Haldol  was reintubated. PMHx: Parkinson's disease, CVA, carotid artery stenosis, chronic headaches    PT Comments  Patient is agreeable to PT session. He declined hallway ambulation due to walking earlier and fatigue. Safety cues with mobility with rolling walker encouraged for fall prevention. LE exercises performed for strengthening. He is eager to return to [facility]. Recommend to continue PT to maximize independence and decrease caregiver burden.    If plan is discharge home, recommend the following: Assistance with cooking/housework;Assist for transportation;Help with stairs or ramp for entrance;A little help with walking and/or transfers;A little help with bathing/dressing/bathroom;Direct supervision/assist for medications management;Direct supervision/assist for financial management   Can travel by private vehicle     Yes  Equipment Recommendations  None recommended by PT    Recommendations for Other Services       Precautions / Restrictions Precautions Precautions: Fall Recall of Precautions/Restrictions: Impaired Restrictions Weight Bearing Restrictions Per Provider Order: No     Mobility  Bed Mobility Overal bed mobility: Needs Assistance Bed Mobility: Sit to Supine     Supine to sit: Supervision Sit to supine: Contact guard assist         Transfers Overall transfer level: Needs assistance Equipment used: None, Rolling walker (2 wheels) Transfers: Sit to/from Stand, Bed to chair/wheelchair/BSC Sit to Stand: Contact guard assist     Squat pivot transfers: Contact guard assist     General transfer comment: cues for safety with transfers. RW used with standing from chair with no arm rests. patient declined using rolling walker for squat pivot from bed to recliner chair.    Ambulation/Gait Ambulation/Gait assistance: Contact guard assist Gait Distance (Feet): 20 Feet Assistive device: Rolling walker (2 wheels) Gait Pattern/deviations: Step-through pattern, Decreased stride length, Shuffle, Narrow base of support, Trunk flexed Gait velocity: decreased     General Gait Details: narrow base of support with genu valgum appearing right LE. cues for safety with rolling walker. further ambulation distance self limited due to fatigue. patient reports using the wheelchair as primary means of mobility at baseline.   Stairs             Wheelchair Mobility     Tilt Bed    Modified Rankin (Stroke Patients Only)       Balance Overall balance assessment: Needs assistance Sitting-balance support: No upper extremity supported, Feet supported Sitting balance-Leahy Scale: Good     Standing balance support: Bilateral upper extremity supported, During functional activity, Reliant on assistive device for balance Standing balance-Leahy Scale: Poor Standing balance comment: relying on rolling walker for support in standing                            Communication Communication Communication: Impaired Factors Affecting Communication: Difficulty expressing self;Reduced clarity of speech  Cognition Arousal: Alert Behavior During Therapy: WFL for tasks assessed/performed   PT - Cognitive impairments: No family/caregiver present to determine baseline, Safety/Judgement  PT -  Cognition Comments: decreased safety awareness Following commands: Intact      Cueing Cueing Techniques: Verbal cues  Exercises General Exercises - Lower Extremity Heel Slides: AROM, AAROM, Strengthening, Both, 5 reps, Supine Hip ABduction/ADduction: AAROM, Strengthening, Both, 5 reps, Supine, AROM Straight Leg Raises: AROM, AAROM, Strengthening, Both, 5 reps, Supine Other Exercises Other Exercises: cues for exercise technique for strengthening    General Comments General comments (skin integrity, edema, etc.): Sp02 96% on room air      Pertinent Vitals/Pain Pain Assessment Pain Assessment: No/denies pain    Home Living                          Prior Function            PT Goals (current goals can now be found in the care plan section) Acute Rehab PT Goals Patient Stated Goal: to go home PT Goal Formulation: With patient Time For Goal Achievement: 03/27/24 Potential to Achieve Goals: Good Progress towards PT goals: Progressing toward goals    Frequency    Min 2X/week      PT Plan      Co-evaluation              AM-PAC PT 6 Clicks Mobility   Outcome Measure  Help needed turning from your back to your side while in a flat bed without using bedrails?: A Little Help needed moving from lying on your back to sitting on the side of a flat bed without using bedrails?: A Little Help needed moving to and from a bed to a chair (including a wheelchair)?: A Little Help needed standing up from a chair using your arms (e.g., wheelchair or bedside chair)?: A Little Help needed to walk in hospital room?: A Little Help needed climbing 3-5 steps with a railing? : Total 6 Click Score: 16    End of Session   Activity Tolerance: Patient tolerated treatment well Patient left: in chair;with call bell/phone within reach;with chair alarm set Nurse Communication: Mobility status PT Visit Diagnosis: Unsteadiness on feet (R26.81);Other abnormalities of gait and  mobility (R26.89);Muscle weakness (generalized) (M62.81);History of falling (Z91.81);Repeated falls (R29.6);Difficulty in walking, not elsewhere classified (R26.2);Other symptoms and signs involving the nervous system (R29.898)     Time: 8848-8790 PT Time Calculation (min) (ACUTE ONLY): 18 min  Charges:    $Therapeutic Activity: 8-22 mins PT General Charges $$ ACUTE PT VISIT: 1 Visit                     Randine Essex, PT, MPT    Randine LULLA Essex 03/14/2024, 12:34 PM

## 2024-03-14 NOTE — Plan of Care (Signed)
 Patient calm and cooperative A&O X3, disoriented to time. Patient able to ambulate to bathroom after putting shoes on, one person assist. Patient wears self brought pull ups, no episodes of incontinence. Patient woke up at stating he was severely restless, Iv anxiety medication given, patient had some relieved. Patient reminded to maintain turn tolerance to avoid pressure injury. Patient left with call bell in reach, bed in lowest position and side rails up.   Problem: Education: Goal: Knowledge of General Education information will improve Description: Including pain rating scale, medication(s)/side effects and non-pharmacologic comfort measures Outcome: Progressing   Problem: Health Behavior/Discharge Planning: Goal: Ability to manage health-related needs will improve Outcome: Progressing   Problem: Clinical Measurements: Goal: Ability to maintain clinical measurements within normal limits will improve Outcome: Progressing Goal: Diagnostic test results will improve Outcome: Progressing Goal: Respiratory complications will improve Outcome: Progressing   Problem: Activity: Goal: Risk for activity intolerance will decrease Outcome: Progressing   Problem: Coping: Goal: Level of anxiety will decrease Outcome: Progressing   Problem: Elimination: Goal: Will not experience complications related to bowel motility Outcome: Progressing   Problem: Pain Managment: Goal: General experience of comfort will improve and/or be controlled Outcome: Progressing   Problem: Safety: Goal: Ability to remain free from injury will improve Outcome: Progressing

## 2024-03-14 NOTE — Plan of Care (Signed)
  Problem: Education: Goal: Knowledge of General Education information will improve Description: Including pain rating scale, medication(s)/side effects and non-pharmacologic comfort measures Outcome: Adequate for Discharge   Problem: Health Behavior/Discharge Planning: Goal: Ability to manage health-related needs will improve Outcome: Adequate for Discharge   Problem: Clinical Measurements: Goal: Ability to maintain clinical measurements within normal limits will improve Outcome: Adequate for Discharge Goal: Will remain free from infection Outcome: Adequate for Discharge Goal: Diagnostic test results will improve Outcome: Adequate for Discharge Goal: Respiratory complications will improve Outcome: Adequate for Discharge Goal: Cardiovascular complication will be avoided Outcome: Adequate for Discharge   Problem: Activity: Goal: Risk for activity intolerance will decrease Outcome: Adequate for Discharge   Problem: Nutrition: Goal: Adequate nutrition will be maintained Outcome: Adequate for Discharge   Problem: Coping: Goal: Level of anxiety will decrease Outcome: Adequate for Discharge   Problem: Elimination: Goal: Will not experience complications related to bowel motility Outcome: Adequate for Discharge Goal: Will not experience complications related to urinary retention Outcome: Adequate for Discharge   Problem: Pain Managment: Goal: General experience of comfort will improve and/or be controlled Outcome: Adequate for Discharge   Problem: Safety: Goal: Ability to remain free from injury will improve Outcome: Adequate for Discharge   Problem: Skin Integrity: Goal: Risk for impaired skin integrity will decrease Outcome: Adequate for Discharge   Problem: Acute Rehab PT Goals(only PT should resolve) Goal: Pt Will Go Supine/Side To Sit Outcome: Adequate for Discharge Goal: Patient Will Transfer Sit To/From Stand Outcome: Adequate for Discharge Goal: Pt Will  Perform Standing Balance Or Pre-Gait Outcome: Adequate for Discharge Goal: Pt Will Ambulate Outcome: Adequate for Discharge

## 2024-03-14 NOTE — Progress Notes (Signed)
 Called [facility] to give no report. Received voicemail. Will try again.

## 2024-03-17 MED FILL — Medication: Qty: 1 | Status: AC

## 2024-03-18 NOTE — Progress Notes (Unsigned)
 Office Note     CC:  follow up Requesting Provider:  Burney Darice CROME, MD  HPI: Terry [name] is a 78 y.o. 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 He was evaluated for mesenteric ischemia back in 2022, at which time he was having unintended weight loss, decreased appetite, early satiety and abdominal pain. At the time this was felt to be unrelated to his mesenteric stenosis seen on duplex. He was referred to GI.  Since they he reports he continues to have decreased appetite. He has been having episodic lower abdominal pain. Sometimes this is severe and causes associated nausea. He says when these episodes occur he feels like he needs to have bowel movement but he is not able to. He has been having issues with constipation. He is taking prune juice and eating stewed prunes as well as taking Milk of Magnesia. He feels the mild of magnesia is not helpful. He also has suppositories/ enema available as needed. At times he says he feels like he has been impacted. He otherwise denies any pain with eating or food fear. He has lost some more weight over the past month but feels this is likely due to his decreased appetite. He says he tries to eat high fiber and healthy foods.   He was just discharged from [facility] on 03/14/24 after admission for AMS. This was felt to be related to his Parkinson's. He was evaluated for his abdominal pain during his admission. No clear etiology was found. He underwent CTA that showed some mesenteric stenosis but patency of his IMA/ SMA and celiac arteries. He has had several admissions over the past 3 months with same concerns.  He is has history of left carotid endarterectomy for symptomatic stenosis on 04/27/2021 by Dr. Serene. Today he denies symptoms of TIA, amaurosis, or stroke.  He denies amaurosis, weakness or aphasia.  The pt is on a statin for cholesterol management.    The pt  is on an aspirin .    Other AC:  None The pt is on no medications for hypertension.  The pt does not have diabetes. Tobacco hx:  never, uses snuff   Past Medical History:  Diagnosis Date   Carotid artery stenosis 04/29/2018   Chronic tension headaches 04/29/2018   Essential hypertension 04/29/2018   GERD (gastroesophageal reflux disease) 04/29/2018   HLD (hyperlipidemia) 04/29/2018   Parkinson's disease (HCC) 04/29/2018    Past Surgical History:  Procedure Laterality Date   BIOPSY  01/09/2022   Procedure: BIOPSY;  Surgeon: Wilhelmenia Aloha Raddle., MD;  Location: [facility];  Service: Gastroenterology;;  EGD and COLON   COLONOSCOPY WITH PROPOFOL  N/A 01/09/2022   Procedure: COLONOSCOPY WITH PROPOFOL ;  Surgeon: Wilhelmenia Aloha Raddle., MD;  Location: [facility];  Service: Gastroenterology;  Laterality: N/A;   ENDARTERECTOMY Left 04/27/2021   Procedure: LEFT CAROTID ENDARTERECTOMY;  Surgeon: Serene Gaile ORN, MD;  Location: [facility];  Service: Vascular;  Laterality: Left;   ESOPHAGOGASTRODUODENOSCOPY (EGD) WITH PROPOFOL  N/A 01/09/2022   Procedure: ESOPHAGOGASTRODUODENOSCOPY (EGD) WITH PROPOFOL ;  Surgeon: Wilhelmenia Aloha Raddle., MD;  Location: [facility];  Service: Gastroenterology;  Laterality: N/A;   LEFT HEART CATH AND CORONARY ANGIOGRAPHY N/A 11/25/2019   Procedure: LEFT HEART CATH AND CORONARY ANGIOGRAPHY;  Surgeon: Claudene Victory ORN, MD;  Location: [facility];  Service: Cardiovascular;  Laterality: N/A;   PATCH ANGIOPLASTY Left 04/27/2021   Procedure: PATCH ANGIOPLASTY USING XENOSURE BIOLOGIC PATCH;  Surgeon:  Serene Gaile ORN, MD;  Location: [facility];  Service: Vascular;  Laterality: Left;   TONSILLECTOMY      Social History   Socioeconomic History   Marital status: Married    Spouse name: Not on file   Number of children: Not on file   Years of education: Not on file   Highest education level: Not on file  Occupational History   Not on file  Tobacco Use   Smoking status: Never     Passive exposure: Never   Smokeless tobacco: Former    Types: Snuff  Vaping Use   Vaping status: Never Used  Substance and Sexual Activity   Alcohol use: Yes    Comment: Occasionally.   Drug use: Never   Sexual activity: Not Currently  Other Topics Concern   Not on file  Social History Narrative   Not on file   Social Drivers of Health   Financial Resource Strain: Not on file  Food Insecurity: No Food Insecurity (03/12/2024)   Hunger Vital Sign    Worried About Running Out of Food in the Last Year: Never true    Ran Out of Food in the Last Year: Never true  Transportation Needs: No Transportation Needs (03/12/2024)   PRAPARE - Administrator, Civil Service (Medical): No    Lack of Transportation (Non-Medical): No  Physical Activity: Not on file  Stress: Not on file  Social Connections: Socially Integrated (03/12/2024)   Social Connection and Isolation Panel    Frequency of Communication with Friends and Family: Three times a week    Frequency of Social Gatherings with Friends and Family: Twice a week    Attends Religious Services: More than 4 times per year    Active Member of [employer] or Organizations: Yes    Attends Banker Meetings: Never    Marital Status: Married  Catering manager Violence: Not At Risk (03/12/2024)   Humiliation, Afraid, Rape, and Kick questionnaire    Fear of Current or Ex-Partner: No    Emotionally Abused: No    Physically Abused: No    Sexually Abused: No    Family History  Problem Relation Age of Onset   Heart disease Mother    Cancer Father    Parkinson's disease Sister    Colon cancer Neg Hx    Stomach cancer Neg Hx    Esophageal cancer Neg Hx    Inflammatory bowel disease Neg Hx    Liver disease Neg Hx    Pancreatic cancer Neg Hx    Rectal cancer Neg Hx     Current Outpatient Medications  Medication Sig Dispense Refill   acetaminophen  (TYLENOL ) 325 MG tablet Take 325 mg by mouth daily as needed for mild pain  (pain score 1-3).     aspirin  EC 81 MG EC tablet Take 1 tablet (81 mg total) by mouth daily. Swallow whole. 30 tablet 1   atorvastatin  (LIPITOR) 20 MG tablet TAKE ONE TABLET BY MOUTH EVERY NIGHT AT BEDTIME (Patient taking differently: Take 20 mg by mouth at bedtime.) 90 tablet 0   brimonidine -timolol  (COMBIGAN ) 0.2-0.5 % ophthalmic solution Apply 1 drop to eye 2 (two) times daily.     carbidopa -levodopa  (SINEMET  CR) 50-200 MG tablet Take 1 tablet by mouth at bedtime. 30 tablet 0   carbidopa -levodopa  (SINEMET  IR) 25-100 MG tablet Take 2 tablets by mouth every 3 (three) hours while awake. From 0600 through 2100 (no need for midnight/0300 dose) 360 tablet 1   Cyanocobalamin (  VITAMIN B-12 IJ) Inject 1,000 mcg as directed every 30 (thirty) days.     feeding supplement (ENSURE PLUS HIGH PROTEIN) LIQD Take 237 mLs by mouth 2 (two) times daily between meals.     hydrALAZINE  (APRESOLINE ) 50 MG tablet TAKE 1 TABLET(50 MG) BY MOUTH TWICE DAILY (Patient taking differently: Take 50 mg by mouth in the morning and at bedtime. 9AM and 9PM) 180 tablet 2   meloxicam  (MOBIC ) 7.5 MG tablet Take 7.5 mg by mouth 2 (two) times daily as needed.     olopatadine  (PATANOL) 0.1 % ophthalmic solution Place 1 drop into both eyes daily as needed for allergies.     polyethylene glycol powder (GLYCOLAX /MIRALAX ) 17 GM/SCOOP powder Take 17 g by mouth 2 (two) times daily. 238 g 0   Prenatal Vit-Fe Fumarate-FA (M-NATAL PLUS) 27-1 MG TABS Take 1 tablet by mouth daily.     QUEtiapine  (SEROQUEL ) 25 MG tablet Take 1 tablet (25 mg total) by mouth at bedtime as needed (Agitation.). 30 tablet 0   rotigotine  (NEUPRO ) 2 MG/24HR Place 1 patch onto the skin daily. 30 patch 2   triamcinolone ointment (KENALOG) 0.1 % Apply topically 3 (three) times daily.     Vitamin D, Ergocalciferol, (DRISDOL) 1.25 MG (50000 UNIT) CAPS capsule Take 50,000 Units by mouth every Tuesday.     No current facility-administered medications for this visit.     Allergies  Allergen Reactions   Erythromycin Rash, Dermatitis and Other (See Comments)    Other Reaction(s): Unknown  Stomach Cramps  erythromycin   Haldol  [Haloperidol ]     Haldol  caused bradycardia and respiratory arrest   Penicillins Rash, Dermatitis, Hives and Other (See Comments)    Other Reaction(s): Unknown  Product containing penicillin (product)   Azithromycin Other (See Comments)    GI upset  azithromycin   Clam Shell Nausea And Vomiting   Oyster Extract Nausea And Vomiting and Other (See Comments)    clam allergenic extract   Haloperidol  And Related Other (See Comments)    Respiratory arrest requiring intubation twice.      REVIEW OF SYSTEMS:  Negative unless noted in HPI [X]  denotes positive finding, [ ]  denotes negative finding Cardiac  Comments:  Chest pain or chest pressure:    Shortness of breath upon exertion:    Short of breath when lying flat:    Irregular heart rhythm:        Vascular    Pain in calf, thigh, or hip brought on by ambulation:    Pain in feet at night that wakes you up from your sleep:     Blood clot in your veins:    Leg swelling:         Pulmonary    Oxygen at home:    Productive cough:     Wheezing:         Neurologic    Sudden weakness in arms or legs:     Sudden numbness in arms or legs:     Sudden onset of difficulty speaking or slurred speech:    Temporary loss of vision in one eye:     Problems with dizziness:         Gastrointestinal    Blood in stool:     Vomited blood:         Genitourinary    Burning when urinating:     Blood in urine:        Psychiatric    Major depression:  Hematologic    Bleeding problems:    Problems with blood clotting too easily:        Skin    Rashes or ulcers:        Constitutional    Fever or chills:      PHYSICAL EXAMINATION:  Vitals:   03/19/24 1000  BP: (!) 178/78  Pulse: 70  Temp: 97.9 F (36.6 C)  TempSrc: Temporal  Weight: 155 lb 1.6 oz (70.4  kg)    General:  WDWN in NAD; vital signs documented above Gait: Not observed, in wheel chair  HENT: WNL, normocephalic Pulmonary: normal non-labored breathing Cardiac: regular HR Abdomen: soft, ND, mild tenderness across lower abdomen Vascular Exam/Pulses: 2+ radial pulses, 2+ femoral pulses, 2+ DP pulses bilaterally Extremities: without ischemic changes, without Gangrene , without cellulitis; without open wounds;  Musculoskeletal: no muscle wasting or atrophy  Neurologic: A&O X 3 Psychiatric:  The pt has Normal affect.   Non-Invasive Vascular Imaging:   Mesenteric complete: Duplex Findings:  +--------------------+--------+--------+------+--------+  Mesenteric         PSV cm/sEDV cm/sPlaqueComments  +--------------------+--------+--------+------+--------+  Aorta at Celiac        97                           +--------------------+--------+--------+------+--------+  Aorta Mid             128                           +--------------------+--------+--------+------+--------+  Celiac Artery Origin  156                           +--------------------+--------+--------+------+--------+  SMA Origin            208                           +--------------------+--------+--------+------+--------+  SMA Proximal          500                           +--------------------+--------+--------+------+--------+  SMA Mid               208                           +--------------------+--------+--------+------+--------+  SMA Distal            171                           +--------------------+--------+--------+------+--------+  CHA                  254                           +--------------------+--------+--------+------+--------+  Splenic              168                           +--------------------+--------+--------+------+--------+  IMA                  310                            +--------------------+--------+--------+------+--------+  Summary:  Mesenteric: 70 to 99% stenosis in the superior mesenteric artery. The Inferior  Mesenteric artery appears stenotic. Elevated velocity is seen in the common hepatic artery.    03/11/24: CT abdomen/ pelvis IMPRESSION: 1. No acute abnormality in the abdomen or pelvis. 2. Aortic Atherosclerosis (ICD10-I70.0).  02/19/24: CTA chest/ abdomen/ pelvis for dissection IMPRESSION: 1. Motion artifact limits examination. 2. No evidence of aneurysm, dissection, or significant stenosis in the thoracic or abdominal aorta. Aortic atherosclerosis. 3. No significant central pulmonary embolus is identified. 4. No evidence of active pulmonary disease. 5. No acute process demonstrated in the abdomen or pelvis.   ASSESSMENT/PLAN:: 78 y.o. 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 He is having diffuse lower abdominal pain. This is episodic and not associated with eating. He does not have any food fear or post prandial pain, nausea or vomiting. I suspect his symptoms are related to severe constipation. Encourage continued bowel regimen and increased mobility to help with his bowel habits. Stool softeners/ laxative/ suppository as needed. Recommend he follow up with PCP for this or GI. He remains without any TIA or stroke like symptoms.  - Mesenteric duplex today is overall stable from prior duplex in 2022. Slight increase in velocity in the proximal SMA (391 previously now 500) - Duplex today correlates with recent CTA imaging  - would not recommend any mesenteric intervention at this time - continue Aspirin  and Statin -He will need carotid duplex at next visit in 6 months and we will repeat his mesenteric duplex at that time as well    Teretha Damme, PA-C [facility] [phone]  Clinic MD:  Sheree

## 2024-03-19 ENCOUNTER — Ambulatory Visit (HOSPITAL_COMMUNITY)
Admission: RE | Admit: 2024-03-19 | Discharge: 2024-03-19 | Disposition: A | Discharge status: Home / Self Care | Source: Ambulatory Visit | Attending: Surgery | Admitting: Surgery

## 2024-03-19 ENCOUNTER — Ambulatory Visit: Attending: Vascular Surgery | Admitting: Physician Assistant

## 2024-03-19 VITALS — BP 178/78 | HR 70 | Temp 97.9°F | Wt 155.1 lb

## 2024-03-19 DIAGNOSIS — K551 Chronic vascular disorders of intestine: Secondary | ICD-10-CM | POA: Insufficient documentation

## 2024-03-19 DIAGNOSIS — I6529 Occlusion and stenosis of unspecified carotid artery: Secondary | ICD-10-CM | POA: Diagnosis not present

## 2024-03-20 ENCOUNTER — Other Ambulatory Visit: Payer: Self-pay | Admitting: *Deleted

## 2024-03-20 DIAGNOSIS — I6529 Occlusion and stenosis of unspecified carotid artery: Secondary | ICD-10-CM

## 2024-03-20 DIAGNOSIS — K551 Chronic vascular disorders of intestine: Secondary | ICD-10-CM

## 2024-06-23 DEATH — deceased

## 2024-09-01 ENCOUNTER — Encounter (HOSPITAL_COMMUNITY)

## 2024-09-01 ENCOUNTER — Ambulatory Visit
